# Patient Record
Sex: Female | Born: 1935
Health system: Southern US, Community
[De-identification: ages and names within clinical notes are randomized; demographics above are authoritative.]

## PROBLEM LIST (undated history)

## (undated) DIAGNOSIS — I5030 Unspecified diastolic (congestive) heart failure: Secondary | ICD-10-CM

## (undated) DIAGNOSIS — E785 Hyperlipidemia, unspecified: Secondary | ICD-10-CM

## (undated) DIAGNOSIS — I38 Endocarditis, valve unspecified: Secondary | ICD-10-CM

## (undated) DIAGNOSIS — I4891 Unspecified atrial fibrillation: Secondary | ICD-10-CM

## (undated) DIAGNOSIS — R0602 Shortness of breath: Secondary | ICD-10-CM

## (undated) DIAGNOSIS — F419 Anxiety disorder, unspecified: Secondary | ICD-10-CM

## (undated) DIAGNOSIS — J449 Chronic obstructive pulmonary disease, unspecified: Secondary | ICD-10-CM

## (undated) HISTORY — PX: ROTATOR CUFF REPAIR: SHX139

## (undated) HISTORY — DX: Shortness of breath: R06.02

## (undated) HISTORY — PX: ABDOMINAL HYSTERECTOMY: SHX81

## (undated) HISTORY — PX: BREAST BIOPSY: SHX20

## (undated) HISTORY — PX: CATARACT EXTRACTION: SUR2

## (undated) HISTORY — PX: TONSILLECTOMY: SHX5217

## (undated) HISTORY — DX: Unspecified atrial fibrillation: I48.91

## (undated) HISTORY — DX: Unspecified diastolic (congestive) heart failure: I50.30

## (undated) HISTORY — DX: Anxiety disorder, unspecified: F41.9

## (undated) HISTORY — DX: Chronic obstructive pulmonary disease, unspecified: J44.9

## (undated) HISTORY — DX: Hyperlipidemia, unspecified: E78.5

---

## 2013-02-09 ENCOUNTER — Ambulatory Visit (INDEPENDENT_AMBULATORY_CARE_PROVIDER_SITE_OTHER): Payer: Medicare Other | Admitting: Cardiology

## 2013-02-09 ENCOUNTER — Encounter: Payer: Self-pay | Admitting: Cardiology

## 2013-02-09 ENCOUNTER — Encounter: Payer: Self-pay | Admitting: *Deleted

## 2013-02-09 VITALS — BP 110/82 | HR 130 | Ht 63.0 in | Wt 190.0 lb

## 2013-02-09 DIAGNOSIS — I2510 Atherosclerotic heart disease of native coronary artery without angina pectoris: Secondary | ICD-10-CM

## 2013-02-09 DIAGNOSIS — I5033 Acute on chronic diastolic (congestive) heart failure: Secondary | ICD-10-CM

## 2013-02-09 DIAGNOSIS — R0602 Shortness of breath: Secondary | ICD-10-CM

## 2013-02-09 DIAGNOSIS — I4891 Unspecified atrial fibrillation: Secondary | ICD-10-CM

## 2013-02-09 LAB — HEPATIC FUNCTION PANEL
AST: 26 U/L (ref 0–37)
Alkaline Phosphatase: 58 U/L (ref 39–117)
Indirect Bilirubin: 0.7 mg/dL (ref 0.0–0.9)
Total Protein: 6.1 g/dL (ref 6.0–8.3)

## 2013-02-09 LAB — BASIC METABOLIC PANEL
BUN: 18 mg/dL (ref 6–23)
CO2: 27 mEq/L (ref 19–32)
Chloride: 104 mEq/L (ref 96–112)
Creat: 1.04 mg/dL (ref 0.50–1.10)
Glucose, Bld: 94 mg/dL (ref 70–99)
Potassium: 4.3 mEq/L (ref 3.5–5.3)

## 2013-02-09 LAB — CBC WITH DIFFERENTIAL/PLATELET
Basophils Absolute: 0 10*3/uL (ref 0.0–0.1)
Basophils Relative: 0 % (ref 0–1)
Eosinophils Absolute: 0.2 10*3/uL (ref 0.0–0.7)
Hemoglobin: 12.5 g/dL (ref 12.0–15.0)
MCH: 29.2 pg (ref 26.0–34.0)
MCHC: 34.5 g/dL (ref 30.0–36.0)
Monocytes Absolute: 0.9 10*3/uL (ref 0.1–1.0)
Monocytes Relative: 9 % (ref 3–12)
Neutrophils Relative %: 68 % (ref 43–77)
RDW: 14.6 % (ref 11.5–15.5)

## 2013-02-09 LAB — TSH: TSH: 0.86 u[IU]/mL (ref 0.350–4.500)

## 2013-02-09 MED ORDER — FUROSEMIDE 40 MG PO TABS: 40.0000 mg | ORAL_TABLET | Freq: Every day | ORAL | Status: DC

## 2013-02-09 MED ORDER — POTASSIUM CHLORIDE CRYS ER 20 MEQ PO TBCR: 20.0000 meq | EXTENDED_RELEASE_TABLET | Freq: Every day | ORAL | Status: DC

## 2013-02-09 MED ORDER — AMIODARONE HCL 200 MG PO TABS: ORAL_TABLET | ORAL | Status: DC

## 2013-02-09 NOTE — Patient Instructions (Addendum)
Increase amiodarone to 400mg  two times a day. This will be 2 of your 200mg  tablets two times a day.   Start lasix(furosmide) 40mg  daily.   Start KCL (potassium) 20 mEq daily.  Your physician recommends that you return for lab work today--BMET/BNP/Liver profile/TSH/CBCD  Your physician has recommended that you have a Cardioversion (DCCV). Electrical Cardioversion uses a jolt of electricity to your heart either through paddles or wired patches attached to your chest. This is a controlled, usually prescheduled, procedure. Defibrillation is done under light anesthesia in the hospital, and you usually go home the day of the procedure. This is done to get your heart back into a normal rhythm. You are not awake for the procedure. Please see the instruction sheet given to you today. Tuesday September 16,2014  Your physician has requested that you have an echocardiogram. Echocardiography is a painless test that uses sound waves to create images of your heart. It provides your doctor with information about the size and shape of your heart and how well your heart's chambers and valves are working. This procedure takes approximately one hour. There are no restrictions for this procedure. At [facility] on Tuesday  after the cardioversion.  Your physician recommends that you schedule a follow-up appointment in: 2 weeks with Dr Shirlee Latch.   Your physician recommends that you return for lab work in: 2 weeks when you see Dr McLean--BMET/BNP.

## 2013-02-11 DIAGNOSIS — I5033 Acute on chronic diastolic (congestive) heart failure: Secondary | ICD-10-CM | POA: Insufficient documentation

## 2013-02-11 DIAGNOSIS — I2510 Atherosclerotic heart disease of native coronary artery without angina pectoris: Secondary | ICD-10-CM | POA: Insufficient documentation

## 2013-02-11 DIAGNOSIS — I4891 Unspecified atrial fibrillation: Secondary | ICD-10-CM | POA: Insufficient documentation

## 2013-02-11 NOTE — Progress Notes (Signed)
Patient ID: Nicole [name], female   DOB: July 19, 1935, 77 y.o.   MRN: [id number] PCP: Dr. Ladona Ridgel [facility])  77 yo with history of COPD, atrial fibrillation, and diastolic CHF presents to establish cardiology care.  Atrial fibrillation was first noted in 7/14.  She has been hospitalized twice in [place] with atrial fibrillation, once in 7/14 and again at the end of 8/14.  During the second hospitalization, she was cardioverted to NSR.  She was short of breath at that time with a diastolic CHF exacerbation.  Patient was also on amiodarone.  However, currently the patient is back in atrial fibrillation.  Echo was apparently done at the hospital in [place] and EF was normal.    Patient does not notice that she is in atrial fibrillation.  No orthopnea or PND.  She is short of breath after walking 40-50 feet but no chest pain.    ECG: Atrial fibrillation, HR 109  Labs (9/14): K 4, creatinine 0.9, HCT 33.7, TnI 0.21 (while hospitalized in [place])  PMH: 1. COPD 2. Atrial fibrillation: First noted in 7/14.  DCCV in 8/14 to NSR.  Recurred by 9/14.   3. Hyperlipidemia 4. Diastolic CHF 5. Cardiac cath years ago: No CAD but ?history of coronary anomaly from prior   SH: Lives in [place], retired, nonsmoker (quit 7/14).  Lives alone.  Daughter come to appointments.   FH: No premature CAD.  Breast cancer.   ROS: All systems reviewed and negative except as per HPI.   Current Outpatient Prescriptions  Medication Sig Dispense Refill  . ALPRAZolam (XANAX) 0.25 MG tablet Take 0.25 mg by mouth at bedtime as needed for sleep.      Marland Kitchen atorvastatin (LIPITOR) 40 MG tablet Take 40 mg by mouth daily.      Marland Kitchen diltiazem (CARDIZEM SR) 120 MG 12 hr capsule Take 120 mg by mouth 2 (two) times daily.      Marland Kitchen ipratropium-albuterol (DUONEB) 0.5-2.5 (3) MG/3ML SOLN Take 3 mLs by nebulization.      . Multiple Vitamins-Minerals (MULTIVITAMIN WITH MINERALS) tablet Take 1 tablet by mouth daily.      . Rivaroxaban (XARELTO) 20 MG  TABS tablet Take 20 mg by mouth daily.      Marland Kitchen amiodarone (PACERONE) 200 MG tablet 2 tablets (total 400mg ) two times a day  120 tablet  3  . furosemide (LASIX) 40 MG tablet Take 1 tablet (40 mg total) by mouth daily.  30 tablet  3  . potassium chloride SA (K-DUR,KLOR-CON) 20 MEQ tablet Take 1 tablet (20 mEq total) by mouth daily.  30 tablet  3   No current facility-administered medications for this visit.    BP 110/82  Pulse 130  Ht 5\' 3"  (1.6 m)  Wt 86.183 kg (190 lb)  BMI 33.67 kg/m2 General: NAD Neck: JVP 8-9 cm, no thyromegaly or thyroid nodule.  Lungs: Clear to auscultation bilaterally with normal respiratory effort. CV: Nondisplaced PMI.  Heart irregular S1/S2, no S3/S4, 3/6 systolic murmur RUSB, S2 somewhat obscured.  1+ ankle edema.  No carotid bruit.  Normal pedal pulses.  Abdomen: Soft, nontender, no hepatosplenomegaly, no distention.  Skin: Intact without lesions or rashes.  Neurologic: Alert and oriented x 3.  Psych: Normal affect. Extremities: No clubbing or cyanosis.  HEENT: Normal.   Assessment/Plan: 1. Atrial fibrillation: Patient is back in atrial fibrillation after initial cardioversion in [place].  The atrial fibrillation is likely contributing to the patient's CHF symptoms.   - Continue to load amiodarone => will increase  it to 400 mg bid for now.   - Continue diltiazem CD and Xarelto 20.   - Check LFTs/TSH on amiodarone.  Patient will need yearly eye exam.  - Patient will eventually need a sleep study.  2. Elevated troponin: Troponin was mildly elevated at hospital in [place] in the setting of atrial fibrillation with RVR.  Most likely demand ischemia, no chest pain.  Will get echo to look for any wall motion abnormalities.  3. CHF: Patient is volume overloaded on exam.  EF was normal on echo in [place].  I suspect that atrial fibrillation has contributed to CHF.   - As above, will try to get her out of atrial fibrillation.  - Start Lasix 40 mg daily and KCl 20  mEq daily.  - Check BMET/BNP in 10 days.  4. Aortic stenosis: Patient has murmur of aortic stenosis.  I will assess how severe it is by echo (will do echo after cardioversion).      [name] 02/11/2013

## 2013-02-13 ENCOUNTER — Ambulatory Visit (HOSPITAL_COMMUNITY): Payer: Medicare Other | Admitting: Anesthesiology

## 2013-02-13 ENCOUNTER — Encounter (HOSPITAL_COMMUNITY): Payer: Self-pay | Admitting: *Deleted

## 2013-02-13 ENCOUNTER — Encounter (HOSPITAL_COMMUNITY)
Admission: RE | Disposition: A | Discharge status: Home / Self Care | Payer: Self-pay | Source: Ambulatory Visit | Attending: Cardiology

## 2013-02-13 ENCOUNTER — Ambulatory Visit (HOSPITAL_COMMUNITY)
Admission: RE | Admit: 2013-02-13 | Discharge: 2013-02-13 | Disposition: A | Discharge status: Home / Self Care | Payer: Medicare Other | Source: Ambulatory Visit | Attending: Cardiology | Admitting: Cardiology

## 2013-02-13 ENCOUNTER — Encounter (HOSPITAL_COMMUNITY): Payer: Self-pay | Admitting: Anesthesiology

## 2013-02-13 DIAGNOSIS — E785 Hyperlipidemia, unspecified: Secondary | ICD-10-CM | POA: Insufficient documentation

## 2013-02-13 DIAGNOSIS — I359 Nonrheumatic aortic valve disorder, unspecified: Secondary | ICD-10-CM | POA: Insufficient documentation

## 2013-02-13 DIAGNOSIS — I4891 Unspecified atrial fibrillation: Secondary | ICD-10-CM | POA: Insufficient documentation

## 2013-02-13 DIAGNOSIS — Z7901 Long term (current) use of anticoagulants: Secondary | ICD-10-CM | POA: Insufficient documentation

## 2013-02-13 DIAGNOSIS — I509 Heart failure, unspecified: Secondary | ICD-10-CM | POA: Insufficient documentation

## 2013-02-13 DIAGNOSIS — R0602 Shortness of breath: Secondary | ICD-10-CM

## 2013-02-13 DIAGNOSIS — Z79899 Other long term (current) drug therapy: Secondary | ICD-10-CM | POA: Insufficient documentation

## 2013-02-13 DIAGNOSIS — J4489 Other specified chronic obstructive pulmonary disease: Secondary | ICD-10-CM | POA: Insufficient documentation

## 2013-02-13 DIAGNOSIS — J449 Chronic obstructive pulmonary disease, unspecified: Secondary | ICD-10-CM | POA: Insufficient documentation

## 2013-02-13 DIAGNOSIS — I5022 Chronic systolic (congestive) heart failure: Secondary | ICD-10-CM | POA: Insufficient documentation

## 2013-02-13 HISTORY — PX: CARDIOVERSION: SHX1299

## 2013-02-13 SURGERY — CARDIOVERSION
Anesthesia: Monitor Anesthesia Care

## 2013-02-13 MED ORDER — PERFLUTREN LIPID MICROSPHERE
1.0000 mL | INTRAVENOUS | Status: AC | PRN
  Administered 2013-02-13: 2 mL via INTRAVENOUS
  Filled 2013-02-13: qty 10

## 2013-02-13 MED ORDER — SODIUM CHLORIDE 0.9 % IV SOLN
INTRAVENOUS | Status: DC | PRN
  Administered 2013-02-13: 10:00:00 via INTRAVENOUS

## 2013-02-13 MED ORDER — SODIUM CHLORIDE 0.9 % IV SOLN
INTRAVENOUS | Status: DC
  Administered 2013-02-13: 10:00:00 via INTRAVENOUS

## 2013-02-13 MED ORDER — PROPOFOL 10 MG/ML IV BOLUS
INTRAVENOUS | Status: DC | PRN
  Administered 2013-02-13: 80 mg via INTRAVENOUS

## 2013-02-13 NOTE — H&P (View-Only) (Signed)
Patient ID: Nicole [name], female   DOB: 07/04/1935, 77 y.o.   MRN: [id number] PCP: Dr. Taylor ([place])  77 yo with history of COPD, atrial fibrillation, and diastolic CHF presents to establish cardiology care.  Atrial fibrillation was first noted in 7/14.  She has been hospitalized twice in [place] with atrial fibrillation, once in 7/14 and again at the end of 8/14.  During the second hospitalization, she was cardioverted to NSR.  She was short of breath at that time with a diastolic CHF exacerbation.  Patient was also on amiodarone.  However, currently the patient is back in atrial fibrillation.  Echo was apparently done at the hospital in [place] and EF was normal.    Patient does not notice that she is in atrial fibrillation.  No orthopnea or PND.  She is short of breath after walking 40-50 feet but no chest pain.    ECG: Atrial fibrillation, HR 109  Labs (9/14): K 4, creatinine 0.9, HCT 33.7, TnI 0.21 (while hospitalized in [place])  PMH: 1. COPD 2. Atrial fibrillation: First noted in 7/14.  DCCV in 8/14 to NSR.  Recurred by 9/14.   3. Hyperlipidemia 4. Diastolic CHF 5. Cardiac cath years ago: No CAD but ?history of coronary anomaly from prior   SH: Lives in [place], retired, nonsmoker (quit 7/14).  Lives alone.  Daughter come to appointments.   FH: No premature CAD.  Breast cancer.   ROS: All systems reviewed and negative except as per HPI.   Current Outpatient Prescriptions  Medication Sig Dispense Refill  . ALPRAZolam (XANAX) 0.25 MG tablet Take 0.25 mg by mouth at bedtime as needed for sleep.      . atorvastatin (LIPITOR) 40 MG tablet Take 40 mg by mouth daily.      . diltiazem (CARDIZEM SR) 120 MG 12 hr capsule Take 120 mg by mouth 2 (two) times daily.      . ipratropium-albuterol (DUONEB) 0.5-2.5 (3) MG/3ML SOLN Take 3 mLs by nebulization.      . Multiple Vitamins-Minerals (MULTIVITAMIN WITH MINERALS) tablet Take 1 tablet by mouth daily.      . Rivaroxaban (XARELTO) 20 MG  TABS tablet Take 20 mg by mouth daily.      . amiodarone (PACERONE) 200 MG tablet 2 tablets (total 400mg) two times a day  120 tablet  3  . furosemide (LASIX) 40 MG tablet Take 1 tablet (40 mg total) by mouth daily.  30 tablet  3  . potassium chloride SA (K-DUR,KLOR-CON) 20 MEQ tablet Take 1 tablet (20 mEq total) by mouth daily.  30 tablet  3   No current facility-administered medications for this visit.    BP 110/82  Pulse 130  Ht 5' 3" (1.6 m)  Wt 86.183 kg (190 lb)  BMI 33.67 kg/m2 General: NAD Neck: JVP 8-9 cm, no thyromegaly or thyroid nodule.  Lungs: Clear to auscultation bilaterally with normal respiratory effort. CV: Nondisplaced PMI.  Heart irregular S1/S2, no S3/S4, 3/6 systolic murmur RUSB, S2 somewhat obscured.  1+ ankle edema.  No carotid bruit.  Normal pedal pulses.  Abdomen: Soft, nontender, no hepatosplenomegaly, no distention.  Skin: Intact without lesions or rashes.  Neurologic: Alert and oriented x 3.  Psych: Normal affect. Extremities: No clubbing or cyanosis.  HEENT: Normal.   Assessment/Plan: 1. Atrial fibrillation: Patient is back in atrial fibrillation after initial cardioversion in [place].  The atrial fibrillation is likely contributing to the patient's CHF symptoms.   - Continue to load amiodarone => will increase   it to 400 mg bid for now.   - Continue diltiazem CD and Xarelto 20.   - Check LFTs/TSH on amiodarone.  Patient will need yearly eye exam.  - Patient will eventually need a sleep study.  2. Elevated troponin: Troponin was mildly elevated at hospital in [place] in the setting of atrial fibrillation with RVR.  Most likely demand ischemia, no chest pain.  Will get echo to look for any wall motion abnormalities.  3. CHF: Patient is volume overloaded on exam.  EF was normal on echo in [place].  I suspect that atrial fibrillation has contributed to CHF.   - As above, will try to get her out of atrial fibrillation.  - Start Lasix 40 mg daily and KCl 20  mEq daily.  - Check BMET/BNP in 10 days.  4. Aortic stenosis: Patient has murmur of aortic stenosis.  I will assess how severe it is by echo (will do echo after cardioversion).      [name] 02/11/2013  

## 2013-02-13 NOTE — Interval H&P Note (Signed)
History and Physical Interval Note:  02/13/2013 10:00 AM  Nicole [name]  has presented today for surgery, with the diagnosis of A FIB  The various methods of treatment have been discussed with the patient and family. After consideration of risks, benefits and other options for treatment, the patient has consented to  Procedure(s): CARDIOVERSION (N/A) as a surgical intervention .  The patient's history has been reviewed, patient examined, no change in status, stable for surgery.  I have reviewed the patient's chart and labs.  Questions were answered to the patient's satisfaction.     Drevion Offord [employer]

## 2013-02-13 NOTE — Anesthesia Preprocedure Evaluation (Signed)
Anesthesia Evaluation  Patient identified by MRN, date of birth, ID band Patient awake    Reviewed: Allergy & Precautions, H&P , NPO status , Patient's Chart, lab work & pertinent test results, reviewed documented beta blocker date and time   Airway       Dental   Pulmonary          Cardiovascular     Neuro/Psych    GI/Hepatic   Endo/Other    Renal/GU      Musculoskeletal   Abdominal   Peds  Hematology   Anesthesia Other Findings   Reproductive/Obstetrics                           Anesthesia Physical Anesthesia Plan  ASA: III  Anesthesia Plan:    Post-op Pain Management:    Induction:   Airway Management Planned:   Additional Equipment:   Intra-op Plan:   Post-operative Plan:   Informed Consent:   Plan Discussed with:   Anesthesia Plan Comments:         Anesthesia Quick Evaluation

## 2013-02-13 NOTE — Procedures (Signed)
Electrical Cardioversion Procedure Note Nicole [name] [id number] [birth year]  Procedure: Electrical Cardioversion Indications:  Atrial Fibrillation  Procedure Details Consent: Risks of procedure as well as the alternatives and risks of each were explained to the (patient/caregiver).  Consent for procedure obtained. Time Out: Verified patient identification, verified procedure, site/side was marked, verified correct patient position, special equipment/implants available, medications/allergies/relevent history reviewed, required imaging and test results available.  Performed  Patient placed on cardiac monitor, pulse oximetry, supplemental oxygen as necessary.  Sedation given: Propofol per anesthesiology Pacer pads placed anterior and posterior chest.  Cardioverted 1 time(s).  Cardioverted at 200J.  Evaluation Findings: Post procedure EKG shows: NSR Complications: None Patient did tolerate procedure well.   Nicole [name] 02/13/2013, 10:06 AM

## 2013-02-13 NOTE — Preoperative (Signed)
Beta Blockers   Reason not to administer Beta Blockers:Not Applicable 

## 2013-02-13 NOTE — Interval H&P Note (Signed)
History and Physical Interval Note:  02/13/2013 9:55 AM  Nicole [name]  has presented today for surgery, with the diagnosis of A FIB  The various methods of treatment have been discussed with the patient and family. After consideration of risks, benefits and other options for treatment, the patient has consented to  Procedure(s): CARDIOVERSION (N/A) as a surgical intervention .  The patient's history has been reviewed, patient examined, no change in status, stable for surgery.  I have reviewed the patient's chart and labs.  Questions were answered to the patient's satisfaction.     Jasmina Gendron [employer]

## 2013-02-13 NOTE — Transfer of Care (Signed)
Immediate Anesthesia Transfer of Care Note  Patient: Nicole [name]  Procedure(s) Performed: Procedure(s): CARDIOVERSION (N/A)  Patient Location: PACU  Anesthesia Type:General  Level of Consciousness: awake, alert  and oriented  Airway & Oxygen Therapy: Patient Spontanous Breathing and Patient connected to nasal cannula oxygen  Post-op Assessment: Report given to PACU RN and Post -op Vital signs reviewed and stable  Post vital signs: Reviewed and stable  Complications: No apparent anesthesia complications

## 2013-02-13 NOTE — Progress Notes (Signed)
Echocardiogram 2D Echocardiogram with Definity has been performed.  Atina Feeley 02/13/2013, 12:53 PM

## 2013-02-13 NOTE — Anesthesia Postprocedure Evaluation (Signed)
  Anesthesia Post-op Note  Patient: Nicole [name]  Procedure(s) Performed: Procedure(s): CARDIOVERSION (N/A)  Patient Location: Endo Anesthesia Type:MAC  Level of Consciousness: awake  Airway and Oxygen Therapy: Patient Spontanous Breathing  Post-op Pain: mild  Post-op Assessment: Post-op Vital signs reviewed  Post-op Vital Signs: Reviewed  Complications: No apparent anesthesia complications

## 2013-02-14 ENCOUNTER — Encounter (HOSPITAL_COMMUNITY): Payer: Self-pay | Admitting: Cardiology

## 2013-02-26 ENCOUNTER — Encounter: Payer: Self-pay | Admitting: *Deleted

## 2013-02-28 ENCOUNTER — Encounter (HOSPITAL_COMMUNITY): Payer: Self-pay | Admitting: Anesthesiology

## 2013-02-28 ENCOUNTER — Inpatient Hospital Stay (HOSPITAL_COMMUNITY): Payer: Medicare Other

## 2013-02-28 ENCOUNTER — Encounter: Payer: Self-pay | Admitting: Cardiology

## 2013-02-28 ENCOUNTER — Ambulatory Visit (INDEPENDENT_AMBULATORY_CARE_PROVIDER_SITE_OTHER): Payer: Medicare Other | Admitting: Cardiology

## 2013-02-28 ENCOUNTER — Inpatient Hospital Stay (HOSPITAL_COMMUNITY): Payer: Medicare Other | Admitting: Anesthesiology

## 2013-02-28 ENCOUNTER — Other Ambulatory Visit: Payer: Medicare Other

## 2013-02-28 ENCOUNTER — Inpatient Hospital Stay (HOSPITAL_COMMUNITY)
Admission: AD | Admit: 2013-02-28 | Discharge: 2013-03-16 | DRG: 216 | Disposition: A | Discharge status: Skilled Nursing Facility | Payer: Medicare Other | Source: Ambulatory Visit | Attending: Surgery | Admitting: Surgery

## 2013-02-28 VITALS — BP 100/64 | HR 142 | Wt 187.0 lb

## 2013-02-28 DIAGNOSIS — I359 Nonrheumatic aortic valve disorder, unspecified: Secondary | ICD-10-CM

## 2013-02-28 DIAGNOSIS — Z8679 Personal history of other diseases of the circulatory system: Secondary | ICD-10-CM

## 2013-02-28 DIAGNOSIS — I4891 Unspecified atrial fibrillation: Secondary | ICD-10-CM

## 2013-02-28 DIAGNOSIS — I350 Nonrheumatic aortic (valve) stenosis: Secondary | ICD-10-CM | POA: Insufficient documentation

## 2013-02-28 DIAGNOSIS — I509 Heart failure, unspecified: Secondary | ICD-10-CM

## 2013-02-28 DIAGNOSIS — I2510 Atherosclerotic heart disease of native coronary artery without angina pectoris: Secondary | ICD-10-CM

## 2013-02-28 DIAGNOSIS — I5032 Chronic diastolic (congestive) heart failure: Secondary | ICD-10-CM

## 2013-02-28 DIAGNOSIS — Z952 Presence of prosthetic heart valve: Secondary | ICD-10-CM

## 2013-02-28 DIAGNOSIS — R0602 Shortness of breath: Secondary | ICD-10-CM

## 2013-02-28 DIAGNOSIS — I5033 Acute on chronic diastolic (congestive) heart failure: Secondary | ICD-10-CM

## 2013-02-28 LAB — HEPATIC FUNCTION PANEL
ALT: 32 U/L (ref 0–35)
Alkaline Phosphatase: 69 U/L (ref 39–117)
Bilirubin, Direct: 0.2 mg/dL (ref 0.0–0.3)
Indirect Bilirubin: 0.6 mg/dL (ref 0.3–0.9)
Total Bilirubin: 0.8 mg/dL (ref 0.3–1.2)

## 2013-02-28 LAB — CBC WITH DIFFERENTIAL/PLATELET
Basophils Absolute: 0 10*3/uL (ref 0.0–0.1)
Basophils Relative: 0 % (ref 0–1)
Eosinophils Absolute: 0.2 10*3/uL (ref 0.0–0.7)
Eosinophils Relative: 2 % (ref 0–5)
HCT: 33.5 % — ABNORMAL LOW (ref 36.0–46.0)
MCHC: 32.8 g/dL (ref 30.0–36.0)
MCV: 88.9 fL (ref 78.0–100.0)
Monocytes Absolute: 0.9 10*3/uL (ref 0.1–1.0)
Monocytes Relative: 8 % (ref 3–12)
Neutro Abs: 8.4 10*3/uL — ABNORMAL HIGH (ref 1.7–7.7)
RDW: 15.3 % (ref 11.5–15.5)
WBC: 10.9 10*3/uL — ABNORMAL HIGH (ref 4.0–10.5)

## 2013-02-28 LAB — BASIC METABOLIC PANEL
BUN: 18 mg/dL (ref 6–23)
Calcium: 9.5 mg/dL (ref 8.4–10.5)
Chloride: 98 mEq/L (ref 96–112)
Creatinine, Ser: 1.19 mg/dL — ABNORMAL HIGH (ref 0.50–1.10)
GFR calc Af Amer: 50 mL/min — ABNORMAL LOW (ref >90)
Sodium: 136 mEq/L (ref 135–145)

## 2013-02-28 LAB — GLUCOSE, CAPILLARY: Glucose-Capillary: 101 mg/dL — ABNORMAL HIGH (ref 70–99)

## 2013-02-28 LAB — TROPONIN I: Troponin I: <0.3 ng/mL (ref <0.30)

## 2013-02-28 MED ORDER — ONDANSETRON HCL 4 MG/2ML IJ SOLN: 4.0000 mg | Freq: Four times a day (QID) | INTRAMUSCULAR | Status: DC | PRN

## 2013-02-28 MED ORDER — ALBUTEROL SULFATE (5 MG/ML) 0.5% IN NEBU
2.5000 mg | INHALATION_SOLUTION | Freq: Three times a day (TID) | RESPIRATORY_TRACT | Status: DC
  Administered 2013-02-28 – 2013-03-16 (×44): 2.5 mg via RESPIRATORY_TRACT
  Filled 2013-02-28 (×46): qty 0.5

## 2013-02-28 MED ORDER — SODIUM CHLORIDE 0.9 % IJ SOLN: 3.0000 mL | INTRAMUSCULAR | Status: DC | PRN

## 2013-02-28 MED ORDER — SODIUM CHLORIDE 0.9 % IV SOLN
250.0000 mL | INTRAVENOUS | Status: DC
  Administered 2013-02-28: 1000 mL via INTRAVENOUS

## 2013-02-28 MED ORDER — SODIUM CHLORIDE 0.9 % IJ SOLN
3.0000 mL | Freq: Two times a day (BID) | INTRAMUSCULAR | Status: DC
  Administered 2013-02-28 – 2013-03-04 (×7): 3 mL via INTRAVENOUS

## 2013-02-28 MED ORDER — ADULT MULTIVITAMIN W/MINERALS CH
1.0000 | ORAL_TABLET | Freq: Every day | ORAL | Status: DC
  Administered 2013-03-01 – 2013-03-16 (×14): 1 via ORAL
  Filled 2013-02-28 (×17): qty 1

## 2013-02-28 MED ORDER — SODIUM CHLORIDE 0.9 % IV SOLN: 250.0000 mL | INTRAVENOUS | Status: DC | PRN

## 2013-02-28 MED ORDER — ATORVASTATIN CALCIUM 40 MG PO TABS
40.0000 mg | ORAL_TABLET | Freq: Every evening | ORAL | Status: DC
  Administered 2013-02-28 – 2013-03-15 (×14): 40 mg via ORAL
  Filled 2013-02-28 (×17): qty 1

## 2013-02-28 MED ORDER — IPRATROPIUM BROMIDE 0.02 % IN SOLN
0.5000 mg | Freq: Three times a day (TID) | RESPIRATORY_TRACT | Status: DC
  Administered 2013-02-28 – 2013-03-16 (×44): 0.5 mg via RESPIRATORY_TRACT
  Filled 2013-02-28 (×46): qty 2.5

## 2013-02-28 MED ORDER — AMIODARONE HCL IN DEXTROSE 360-4.14 MG/200ML-% IV SOLN
60.0000 mg/h | INTRAVENOUS | Status: AC
  Administered 2013-02-28 (×2): 60 mg/h via INTRAVENOUS
  Filled 2013-02-28 (×2): qty 200

## 2013-02-28 MED ORDER — ACETAMINOPHEN 325 MG PO TABS: 650.0000 mg | ORAL_TABLET | ORAL | Status: DC | PRN

## 2013-02-28 MED ORDER — ALBUTEROL SULFATE (5 MG/ML) 0.5% IN NEBU: 2.5000 mg | INHALATION_SOLUTION | Freq: Three times a day (TID) | RESPIRATORY_TRACT | Status: DC

## 2013-02-28 MED ORDER — DILTIAZEM HCL 30 MG PO TABS: 30.0000 mg | ORAL_TABLET | Freq: Three times a day (TID) | ORAL | Status: DC

## 2013-02-28 MED ORDER — AMIODARONE HCL IN DEXTROSE 360-4.14 MG/200ML-% IV SOLN
30.0000 mg/h | INTRAVENOUS | Status: DC
  Administered 2013-02-28: 30 mg/h via INTRAVENOUS
  Filled 2013-02-28 (×3): qty 200

## 2013-02-28 MED ORDER — POTASSIUM CHLORIDE CRYS ER 20 MEQ PO TBCR
20.0000 meq | EXTENDED_RELEASE_TABLET | Freq: Every evening | ORAL | Status: DC
  Administered 2013-02-28 – 2013-03-04 (×4): 20 meq via ORAL
  Filled 2013-02-28 (×7): qty 1

## 2013-02-28 MED ORDER — DILTIAZEM HCL 30 MG PO TABS
30.0000 mg | ORAL_TABLET | Freq: Three times a day (TID) | ORAL | Status: DC
  Filled 2013-02-28 (×5): qty 1

## 2013-02-28 MED ORDER — RIVAROXABAN 20 MG PO TABS
20.0000 mg | ORAL_TABLET | Freq: Every day | ORAL | Status: DC
  Administered 2013-02-28: 20 mg via ORAL
  Filled 2013-02-28 (×2): qty 1

## 2013-02-28 MED ORDER — IPRATROPIUM BROMIDE 0.02 % IN SOLN: 0.5000 mg | Freq: Three times a day (TID) | RESPIRATORY_TRACT | Status: DC

## 2013-02-28 MED ORDER — DILTIAZEM HCL ER 60 MG PO CP12
120.0000 mg | ORAL_CAPSULE | Freq: Two times a day (BID) | ORAL | Status: DC
  Filled 2013-02-28: qty 2

## 2013-02-28 MED ORDER — AMIODARONE LOAD VIA INFUSION
150.0000 mg | Freq: Once | INTRAVENOUS | Status: AC
  Administered 2013-02-28: 150 mg via INTRAVENOUS
  Filled 2013-02-28: qty 83.34

## 2013-02-28 MED ORDER — IPRATROPIUM-ALBUTEROL 0.5-2.5 (3) MG/3ML IN SOLN: 3.0000 mL | Freq: Three times a day (TID) | RESPIRATORY_TRACT | Status: DC

## 2013-02-28 MED ORDER — FENTANYL CITRATE 0.05 MG/ML IJ SOLN
INTRAMUSCULAR | Status: AC
  Filled 2013-02-28: qty 2

## 2013-02-28 MED ORDER — SODIUM CHLORIDE 0.9 % IJ SOLN
3.0000 mL | Freq: Two times a day (BID) | INTRAMUSCULAR | Status: DC
  Administered 2013-03-01 – 2013-03-02 (×2): 3 mL via INTRAVENOUS

## 2013-02-28 MED ORDER — SODIUM CHLORIDE 0.9 % IV SOLN: INTRAVENOUS | Status: DC

## 2013-02-28 MED ORDER — MIDAZOLAM HCL 2 MG/2ML IJ SOLN
INTRAMUSCULAR | Status: AC
  Filled 2013-02-28: qty 2

## 2013-02-28 MED ORDER — LACTATED RINGERS IV SOLN
INTRAVENOUS | Status: DC | PRN
  Administered 2013-02-28: 14:00:00 via INTRAVENOUS

## 2013-02-28 MED ORDER — AMIODARONE HCL 200 MG PO TABS
400.0000 mg | ORAL_TABLET | Freq: Two times a day (BID) | ORAL | Status: DC
  Filled 2013-02-28: qty 2

## 2013-02-28 MED ORDER — ALPRAZOLAM 0.25 MG PO TABS
0.2500 mg | ORAL_TABLET | Freq: Every evening | ORAL | Status: DC | PRN
  Administered 2013-03-04: 0.25 mg via ORAL
  Filled 2013-02-28: qty 1

## 2013-02-28 MED ORDER — FUROSEMIDE 40 MG PO TABS
40.0000 mg | ORAL_TABLET | Freq: Every day | ORAL | Status: DC
  Filled 2013-02-28: qty 1

## 2013-02-28 NOTE — Progress Notes (Addendum)
Pt received to 2900 without acute complaints. Denies acute CP or SOB. HR still 120s-140s afib but BP running soft. Pt took AM meds today including Lasix/Amiodarone/Diltiazem. Compliant with Xarelto (takes qpm). D/w Dr. Shirlee Latch - will plan IV amio in place of oral and try to arrange DCCV today. Will still need TEE tomorrow per Dr. Shirlee Latch to eval AS. Dayna Dunn PA-C  DCCV planned for 2pm. Dayna Dunn PA-C

## 2013-02-28 NOTE — Transfer of Care (Signed)
Immediate Anesthesia Transfer of Care Note  Patient: Nicole [name]  Procedure(s) Performed: * No procedures listed *  Patient Location: PACU  Anesthesia Type:MAC  Level of Consciousness: awake, alert  and oriented  Airway & Oxygen Therapy: Patient Spontanous Breathing and Patient connected to nasal cannula oxygen  Post-op Assessment: Report given to PACU RN, Post -op Vital signs reviewed and stable and Patient moving all extremities X 4  Post vital signs: Reviewed and stable  Complications: No apparent anesthesia complications

## 2013-02-28 NOTE — Progress Notes (Signed)
Patient ID: Nicole [name], female   DOB: [birth year], 77 y.o.   MRN: [id number] PCP: Dr. Ladona Ridgel [facility])  77 y.o. with history of COPD, atrial fibrillation, and diastolic CHF presents for cardiology followup.  Atrial fibrillation was first noted in 7/14.  She has been hospitalized twice in [place] with atrial fibrillation, once in 7/14 and again at the end of 8/14.  During the second hospitalization, she was cardioverted to NSR.  She was short of breath at that time with a diastolic CHF exacerbation.  Patient was also on amiodarone.  However, at first appointment with me earlier this month she was back in atrial fibrillation. I cardioverted her on 02/13/13. Initially, she felt great with minimal dyspnea.  However, this Monday she once again became short of breath with minimal exertion and felt tachypalpitations.  She gets short of breath walking from her bedroom to the kitchen.  No lightheadedness though blood pressure is soft today.  No chest pain.   On ECG this morning, she is back in NSR with RVR.   Of note, echo was done in 9/14 showing normal EF, mild LVH, and at least moderate to possibly severe aortic stenosis.     ECG: Atrial fibrillation, HR 142  Labs (9/14): K 4, creatinine 0.9 => 1.04, HCT 33.7, TnI 0.21 (while hospitalized in [place]), BNP 158, TSH normal, AST normal, ALT 39  PMH: 1. COPD 2. Atrial fibrillation: First noted in 7/14.  DCCV in 8/14 to NSR.  Recurred by 9/14.  DCCV to NSR 9/14 but recurred.  3. Hyperlipidemia 4. Diastolic CHF: Echo (9/14) with EF 55-60%, grade II diastolic dysfunction, mild LVH, moderate to severe AS with mean gradient 33 mmHg/peak 49 mmHg and AVA 0.74 cm^2.  5. Cardiac cath years ago: No CAD but ?history of coronary anomaly from prior   SH: Lives in [place], retired, nonsmoker (quit 7/14).  Lives alone.  Daughter come to appointments.   FH: No premature CAD.  Breast cancer.   ROS: All systems reviewed and negative except as per HPI.   Current  Outpatient Prescriptions  Medication Sig Dispense Refill  . ALPRAZolam (XANAX) 0.25 MG tablet Take 0.25 mg by mouth at bedtime as needed for sleep.      Marland Kitchen amiodarone (PACERONE) 200 MG tablet 2 tablets (total 400mg ) two times a day  120 tablet  3  . atorvastatin (LIPITOR) 40 MG tablet Take 40 mg by mouth daily.      Marland Kitchen diltiazem (CARDIZEM SR) 120 MG 12 hr capsule Take 120 mg by mouth 2 (two) times daily.      . furosemide (LASIX) 40 MG tablet Take 1 tablet (40 mg total) by mouth daily.  30 tablet  3  . ipratropium-albuterol (DUONEB) 0.5-2.5 (3) MG/3ML SOLN Take 3 mLs by nebulization.      . Multiple Vitamins-Minerals (MULTIVITAMIN WITH MINERALS) tablet Take 1 tablet by mouth daily.      . potassium chloride SA (K-DUR,KLOR-CON) 20 MEQ tablet Take 1 tablet (20 mEq total) by mouth daily.  30 tablet  3  . Rivaroxaban (XARELTO) 20 MG TABS tablet Take 20 mg by mouth daily.       No current facility-administered medications for this visit.    BP 100/64  Pulse 142  Wt 84.823 kg (187 lb)  BMI 33.13 kg/m2 General: NAD Neck: JVP 7 cm, no thyromegaly or thyroid nodule.  Lungs: Clear to auscultation bilaterally with normal respiratory effort. CV: Nondisplaced PMI.  Heart tachy, irregular S1/S2, no S3/S4, 3/6 systolic murmur  RUSB, S2 somewhat obscured.  No edema.  No carotid bruit.  Normal pedal pulses.  Abdomen: Soft, nontender, no hepatosplenomegaly, no distention.  Skin: Intact without lesions or rashes.  Neurologic: Alert and oriented x 3.  Psych: Normal affect. Extremities: No clubbing or cyanosis.  HEENT: Normal.   Assessment/Plan: 1. Atrial fibrillation: Patient is back in atrial fibrillation after we cardioverted her on 9/16. She felt great when back in NSR but now with significant (NYHA class IV dyspnea).  She tolerates atrial fibrillation poorly and is in RVR today.  - I am going to admit her today for rate management.  Will likely need diltiazem gtt but will continue to monitor BP closely.   Also continue amiodarone for now.  - Will plan to cardiovert again tomorrow to get temporary control.  However, I think she is going to need more definitive treatment => MAZE procedure if aortic valve needs replacement versus ablation.   - Continue Xarelto.  Has not missed any doses.    - Patient will eventually need a sleep study.  2. Elevated troponin: Troponin was mildly elevated at hospital in [place] in the setting of atrial fibrillation with RVR.  Most likely demand ischemia, no chest pain.  No wall motion abnormalities on echo.  Will get troponin x 1 today.  If she needs AVR, will need pre-op cardiac cath.  3. Chronic diastolic CHF: NYHA class IV symptoms but she actually does not look particularly volume overloaded.  I started her on Lasix at last visit.  I think main cause of current symptoms is atrial fibrillation with RVR.   - As above, will try to get her out of atrial fibrillation.  - Continue po Lasix.   - Check BMET/BNP today.  4. Aortic stenosis: At least moderate and possibly severe AS by echo.  Mean gradient in moderate range but severe by calculated valve area.  I am going to do a TEE tomorrow to get a better look at her aortic valve.  AVR with MAZE may be needed.   [name] 02/28/2013

## 2013-02-28 NOTE — Anesthesia Preprocedure Evaluation (Signed)
Anesthesia Evaluation Anesthesia Physical Anesthesia Plan Anesthesia Quick Evaluation  

## 2013-02-28 NOTE — Anesthesia Preprocedure Evaluation (Deleted)
Anesthesia Evaluation  Patient identified by MRN, date of birth, ID band Patient awake    Airway Mallampati: II TM Distance: >3 FB     Dental  (+) Teeth Intact and Dental Advidsory Given   Pulmonary shortness of breath, COPD         Cardiovascular + CAD and +CHF + dysrhythmias Atrial Fibrillation + Valvular Problems/Murmurs AS Rhythm:irregular     Neuro/Psych    GI/Hepatic   Endo/Other    Renal/GU      Musculoskeletal   Abdominal   Peds  Hematology   Anesthesia Other Findings   Reproductive/Obstetrics                           Anesthesia Physical Anesthesia Plan  ASA: III  Anesthesia Plan:    Post-op Pain Management:    Induction:   Airway Management Planned:   Additional Equipment:   Intra-op Plan:   Post-operative Plan:   Informed Consent:   Dental Advisory Given  Plan Discussed with: Anesthesiologist, CRNA and Surgeon  Anesthesia Plan Comments:         Anesthesia Quick Evaluation

## 2013-02-28 NOTE — Care Management Note (Signed)
    Page 1 of 1   02/28/2013     12:07:17 PM   CARE MANAGEMENT NOTE 02/28/2013  Patient:  Nicole [name],Nicole [name]   Account Number:  [id number]  Date Initiated:  02/28/2013  Documentation initiated by:  Junius Creamer  Subjective/Objective Assessment:   adm w at fib     Action/Plan:   lives alone per chart   Anticipated DC Date:     Anticipated DC Plan:  HOME/SELF CARE      DC Planning Services  CM consult      Choice offered to / List presented to:             Status of service:   Medicare Important Message given?   (If response is "NO", the following Medicare IM given date fields will be blank) Date Medicare IM given:   Date Additional Medicare IM given:    Discharge Disposition:    Per UR Regulation:  Reviewed for med. necessity/level of care/duration of stay  If discussed at Long Length of Stay Meetings, dates discussed:    Comments:

## 2013-02-28 NOTE — Patient Instructions (Addendum)
Go to Main Entrance A/ [facility], report to admitting.

## 2013-02-28 NOTE — Progress Notes (Signed)
Patient ID: Nicole [name], female   DOB: 1936/01/31, 77 y.o.   MRN: [id number] [facility]:  Asked to semi emergently cardiovert patient this afternoon.  On xarelto.  Moderate to severe AS  Tolerates rapid rates poorly with dyspnea and hypotension.  BP 90/palp  Rate 145 on amiodarone.  Dr Boneta Lucks administered etomidate.  Single 120 J biphasic shock delivered with conversion to NSR  No immediate neurologic sequelae.  Continue amiodarone Iv  Agree with Dr Shirlee Latch that AVR with MAZE will likely be needed.  TEE in am.  Discussed care with daughter [name] who is one of our CCU nurses  [name]

## 2013-02-28 NOTE — H&P (Signed)
See below for Dr. Alford Highland Progress Note from the office today which serves as the patient's H&P this admission: ----------------- PCP: Dr. Ladona Ridgel [facility])  77 yo with history of COPD, atrial fibrillation, and diastolic CHF presents for cardiology followup. Atrial fibrillation was first noted in 7/14. She has been hospitalized twice in [place] with atrial fibrillation, once in 7/14 and again at the end of 8/14. During the second hospitalization, she was cardioverted to NSR. She was short of breath at that time with a diastolic CHF exacerbation. Patient was also on amiodarone. However, at first appointment with me earlier this month she was back in atrial fibrillation. I cardioverted her on 02/13/13. Initially, she felt great with minimal dyspnea. However, this Monday she once again became short of breath with minimal exertion and felt tachypalpitations. She gets short of breath walking from her bedroom to the kitchen. No lightheadedness though blood pressure is soft today. No chest pain. On ECG this morning, she is back in NSR with RVR.   Of note, echo was done in 9/14 showing normal EF, mild LVH, and at least moderate to possibly severe aortic stenosis.   ECG: Atrial fibrillation, HR 142  Labs (9/14): K 4, creatinine 0.9 => 1.04, HCT 33.7, TnI 0.21 (while hospitalized in [place]), BNP 158, TSH normal, AST normal, ALT 39   PMH:  1. COPD  2. Atrial fibrillation: First noted in 7/14. DCCV in 8/14 to NSR. Recurred by 9/14. DCCV to NSR 9/14 but recurred.  3. Hyperlipidemia  4. Diastolic CHF: Echo (9/14) with EF 55-60%, grade II diastolic dysfunction, mild LVH, moderate to severe AS with mean gradient 33 mmHg/peak 49 mmHg and AVA 0.74 cm^2.  5. Cardiac cath years ago: No CAD but ?history of coronary anomaly from prior  SH: Lives in [place], retired, nonsmoker (quit 7/14). Lives alone. Daughter come to appointments.  FH: No premature CAD. Breast cancer.  ROS: All systems reviewed and negative except  as per HPI.   Current Outpatient Prescriptions   Medication  Sig  Dispense  Refill   .  ALPRAZolam (XANAX) 0.25 MG tablet  Take 0.25 mg by mouth at bedtime as needed for sleep.     Marland Kitchen  amiodarone (PACERONE) 200 MG tablet  2 tablets (total 400mg ) two times a day  120 tablet  3   .  atorvastatin (LIPITOR) 40 MG tablet  Take 40 mg by mouth daily.     Marland Kitchen  diltiazem (CARDIZEM SR) 120 MG 12 hr capsule  Take 120 mg by mouth 2 (two) times daily.     .  furosemide (LASIX) 40 MG tablet  Take 1 tablet (40 mg total) by mouth daily.  30 tablet  3   .  ipratropium-albuterol (DUONEB) 0.5-2.5 (3) MG/3ML SOLN  Take 3 mLs by nebulization.     .  Multiple Vitamins-Minerals (MULTIVITAMIN WITH MINERALS) tablet  Take 1 tablet by mouth daily.     .  potassium chloride SA (K-DUR,KLOR-CON) 20 MEQ tablet  Take 1 tablet (20 mEq total) by mouth daily.  30 tablet  3   .  Rivaroxaban (XARELTO) 20 MG TABS tablet  Take 20 mg by mouth daily.      No current facility-administered medications for this visit.    BP 100/64  Pulse 142  Wt 84.823 kg (187 lb)  BMI 33.13 kg/m2  General: NAD  Neck: JVP 7 cm, no thyromegaly or thyroid nodule.  Lungs: Clear to auscultation bilaterally with normal respiratory effort.  CV: Nondisplaced PMI. Heart  tachy, irregular S1/S2, no S3/S4, 3/6 systolic murmur RUSB, S2 somewhat obscured. No edema. No carotid bruit. Normal pedal pulses.  Abdomen: Soft, nontender, no hepatosplenomegaly, no distention.  Skin: Intact without lesions or rashes.  Neurologic: Alert and oriented x 3.  Psych: Normal affect.  Extremities: No clubbing or cyanosis.  HEENT: Normal.  Assessment/Plan:  1. Atrial fibrillation: Patient is back in atrial fibrillation after we cardioverted her on 9/16. She felt great when back in NSR but now with significant (NYHA class IV dyspnea). She tolerates atrial fibrillation poorly and is in RVR today.  - I am going to admit her today for rate management. Will likely need diltiazem gtt  but will continue to monitor BP closely. Also continue amiodarone for now.  - Will plan to cardiovert again tomorrow to get temporary control. However, I think she is going to need more definitive treatment => MAZE procedure if aortic valve needs replacement versus ablation.  - Continue Xarelto. Has not missed any doses.  - Patient will eventually need a sleep study.  2. Elevated troponin: Troponin was mildly elevated at hospital in [place] in the setting of atrial fibrillation with RVR. Most likely demand ischemia, no chest pain. No wall motion abnormalities on echo. Will get troponin x 1 today. If she needs AVR, will need pre-op cardiac cath.  3. Chronic diastolic CHF: NYHA class IV symptoms but she actually does not look particularly volume overloaded. I started her on Lasix at last visit. I think main cause of current symptoms is atrial fibrillation with RVR.  - As above, will try to get her out of atrial fibrillation.  - Continue po Lasix.  - Check BMET/BNP today.  4. Aortic stenosis: At least moderate and possibly severe AS by echo. Mean gradient in moderate range but severe by calculated valve area. I am going to do a TEE tomorrow to get a better look at her aortic valve. AVR with MAZE may be needed.   Nicole [name]  02/28/2013

## 2013-02-28 NOTE — Progress Notes (Signed)
Pt successsfully cardioverted by Dr. Eden Emms to NSR. Per our disc, will continue IV amio and change extended-release diltiazem to short-acting 30mg  q8hr (hold for SBP <120). TEE planned for AM, orders written. Dayna Dunn PA-C

## 2013-03-01 ENCOUNTER — Other Ambulatory Visit: Payer: Self-pay

## 2013-03-01 ENCOUNTER — Encounter (HOSPITAL_COMMUNITY): Payer: Medicare Other

## 2013-03-01 ENCOUNTER — Encounter (HOSPITAL_COMMUNITY): Payer: Self-pay

## 2013-03-01 ENCOUNTER — Encounter (HOSPITAL_COMMUNITY)
Admission: AD | Disposition: A | Discharge status: Skilled Nursing Facility | Payer: Self-pay | Source: Ambulatory Visit | Attending: Surgery

## 2013-03-01 DIAGNOSIS — I4891 Unspecified atrial fibrillation: Principal | ICD-10-CM

## 2013-03-01 DIAGNOSIS — Z01810 Encounter for preprocedural cardiovascular examination: Secondary | ICD-10-CM

## 2013-03-01 DIAGNOSIS — I359 Nonrheumatic aortic valve disorder, unspecified: Secondary | ICD-10-CM

## 2013-03-01 DIAGNOSIS — I5033 Acute on chronic diastolic (congestive) heart failure: Secondary | ICD-10-CM

## 2013-03-01 HISTORY — PX: CARDIOVERSION: SHX1299

## 2013-03-01 HISTORY — PX: TEE WITHOUT CARDIOVERSION: SHX5443

## 2013-03-01 LAB — CBC
Hemoglobin: 9.3 g/dL — ABNORMAL LOW (ref 12.0–15.0)
MCH: 29.5 pg (ref 26.0–34.0)
MCV: 88.9 fL (ref 78.0–100.0)
Platelets: 153 10*3/uL (ref 150–400)
RBC: 3.15 MIL/uL — ABNORMAL LOW (ref 3.87–5.11)
RDW: 15.4 % (ref 11.5–15.5)
WBC: 7 10*3/uL (ref 4.0–10.5)

## 2013-03-01 LAB — BASIC METABOLIC PANEL
BUN: 15 mg/dL (ref 6–23)
CO2: 25 mEq/L (ref 19–32)
Calcium: 8.9 mg/dL (ref 8.4–10.5)
Creatinine, Ser: 1.03 mg/dL (ref 0.50–1.10)
GFR calc Af Amer: 60 mL/min — ABNORMAL LOW (ref >90)
GFR calc non Af Amer: 52 mL/min — ABNORMAL LOW (ref >90)
Glucose, Bld: 108 mg/dL — ABNORMAL HIGH (ref 70–99)
Potassium: 4.1 mEq/L (ref 3.5–5.1)
Sodium: 136 mEq/L (ref 135–145)

## 2013-03-01 LAB — GLUCOSE, CAPILLARY: Glucose-Capillary: 117 mg/dL — ABNORMAL HIGH (ref 70–99)

## 2013-03-01 SURGERY — ECHOCARDIOGRAM, TRANSESOPHAGEAL
Anesthesia: Moderate Sedation

## 2013-03-01 MED ORDER — MIDAZOLAM HCL 10 MG/2ML IJ SOLN
INTRAMUSCULAR | Status: DC | PRN
  Administered 2013-03-01: 2 mg via INTRAVENOUS
  Administered 2013-03-01 (×2): 1 mg via INTRAVENOUS

## 2013-03-01 MED ORDER — SODIUM CHLORIDE 0.9 % IJ SOLN: 3.0000 mL | INTRAMUSCULAR | Status: DC | PRN

## 2013-03-01 MED ORDER — ASPIRIN 81 MG PO CHEW
81.0000 mg | CHEWABLE_TABLET | ORAL | Status: AC
  Administered 2013-03-02: 81 mg via ORAL
  Filled 2013-03-01: qty 1

## 2013-03-01 MED ORDER — SODIUM CHLORIDE 0.9 % IJ SOLN
3.0000 mL | Freq: Two times a day (BID) | INTRAMUSCULAR | Status: DC
  Administered 2013-03-01 – 2013-03-04 (×6): 3 mL via INTRAVENOUS

## 2013-03-01 MED ORDER — SODIUM CHLORIDE 0.9 % IV SOLN
1.0000 mL/kg/h | INTRAVENOUS | Status: DC
  Administered 2013-03-02: 1 mL/kg/h via INTRAVENOUS

## 2013-03-01 MED ORDER — DILTIAZEM HCL ER COATED BEADS 120 MG PO CP24
120.0000 mg | ORAL_CAPSULE | Freq: Every day | ORAL | Status: DC
  Administered 2013-03-01 – 2013-03-04 (×4): 120 mg via ORAL
  Filled 2013-03-01 (×5): qty 1

## 2013-03-01 MED ORDER — FENTANYL CITRATE 0.05 MG/ML IJ SOLN
INTRAMUSCULAR | Status: AC
  Filled 2013-03-01: qty 2

## 2013-03-01 MED ORDER — POTASSIUM CHLORIDE CRYS ER 20 MEQ PO TBCR
40.0000 meq | EXTENDED_RELEASE_TABLET | Freq: Once | ORAL | Status: AC
  Administered 2013-03-01: 40 meq via ORAL
  Filled 2013-03-01: qty 2

## 2013-03-01 MED ORDER — AMIODARONE HCL 200 MG PO TABS
200.0000 mg | ORAL_TABLET | Freq: Two times a day (BID) | ORAL | Status: DC
  Administered 2013-03-01 – 2013-03-06 (×9): 200 mg via ORAL
  Filled 2013-03-01 (×13): qty 1

## 2013-03-01 MED ORDER — SODIUM CHLORIDE 0.9 % IV SOLN: 250.0000 mL | INTRAVENOUS | Status: DC | PRN

## 2013-03-01 MED ORDER — FUROSEMIDE 10 MG/ML IJ SOLN
40.0000 mg | Freq: Two times a day (BID) | INTRAMUSCULAR | Status: DC
  Administered 2013-03-01: 40 mg via INTRAVENOUS
  Filled 2013-03-01 (×4): qty 4

## 2013-03-01 MED ORDER — FENTANYL CITRATE 0.05 MG/ML IJ SOLN
INTRAMUSCULAR | Status: DC | PRN
  Administered 2013-03-01 (×2): 25 ug via INTRAVENOUS

## 2013-03-01 MED ORDER — BUTAMBEN-TETRACAINE-BENZOCAINE 2-2-14 % EX AERO
INHALATION_SPRAY | CUTANEOUS | Status: DC | PRN
  Administered 2013-03-01: 2 via TOPICAL

## 2013-03-01 MED ORDER — MIDAZOLAM HCL 5 MG/ML IJ SOLN
INTRAMUSCULAR | Status: AC
  Filled 2013-03-01: qty 2

## 2013-03-01 NOTE — Progress Notes (Signed)
At bedside to complete [place] PFT, Pt uncomfortable with mouthpiece, states it is gagging her but will try to complete test. On first breathing exercise pt had coughing spell with inc SOB with coughing. Pt states she is unable to complete PFT at thiis time.

## 2013-03-01 NOTE — CV Procedure (Signed)
Procedure: TEE  Indication: Aortic stenosis assessment  Sedation: Versed 4 mg IV, Fentanyl 50 mcg IV  Findings: Please see echo section of chart for full report.  Normal LV size and systolic function, EF 55%.  Mild LV hypertrophy with focal basal septal hypertrophy.  Normal RV size and systolic function.  The mitral valve was thickened with minimal stenosis and mild to moderate MR.  It did have a somewhat rheumatic appearance.  The aortic valve was heavily calcified and trileaflet.  There was severe aortic stenosis with mean gradient 43 mmHg and AVA 0.79 cm^2.    No complications.   I will discuss patient with CVTS.  Possible candidate for MAZE-AVR.   [name] 03/01/2013

## 2013-03-01 NOTE — Progress Notes (Signed)
Patient ID: Nicole [name], female   DOB: Nov 19, 1935, 78 y.o.   MRN: [id number]    SUBJECTIVE: Patient was cardioverted yesterday back to NSR.  She feels much better this morning. She has a cough.   Marland Kitchen albuterol  2.5 mg Nebulization TID   And  . ipratropium  0.5 mg Nebulization TID  . amiodarone  200 mg Oral BID  . atorvastatin  40 mg Oral QPM  . diltiazem  120 mg Oral Daily  . furosemide  40 mg Intravenous BID  . multivitamin with minerals  1 tablet Oral Daily  . potassium chloride SA  20 mEq Oral QPM  . potassium chloride  40 mEq Oral Once  . Rivaroxaban  20 mg Oral Q supper  . sodium chloride  3 mL Intravenous Q12H  . sodium chloride  3 mL Intravenous Q12H      Filed Vitals:   02/28/13 2041 03/01/13 0000 03/01/13 0400 03/01/13 0433  BP:  94/49 88/52   Pulse:   64   Temp:  98.8 F (37.1 C) 98.5 F (36.9 C)   TempSrc:  Oral Oral   Resp:   26   Height:      Weight:    84.9 kg (187 lb 2.7 oz)  SpO2: 96%  95%     Intake/Output Summary (Last 24 hours) at 03/01/13 0745 Last data filed at 03/01/13 0600  Gross per 24 hour  Intake 1238.8 ml  Output    800 ml  Net  438.8 ml    LABS: Basic Metabolic Panel:  Recent Labs  40/98/11 1100  NA 136  K 3.8  CL 98  CO2 23  GLUCOSE 120*  BUN 18  CREATININE 1.19*  CALCIUM 9.5   Liver Function Tests:  Recent Labs  02/28/13 1100  AST 23  ALT 32  ALKPHOS 69  BILITOT 0.8  PROT 7.2  ALBUMIN 4.0   No results found for this basename: LIPASE, AMYLASE,  in the last 72 hours CBC:  Recent Labs  02/28/13 1100 03/01/13 0620  WBC 10.9* 7.0  NEUTROABS 8.4*  --   HGB 11.0* 9.3*  HCT 33.5* 28.0*  MCV 88.9 88.9  PLT 197 153   Cardiac Enzymes:  Recent Labs  02/28/13 1100  TROPONINI <0.30   BNP: No components found with this basename: POCBNP,  D-Dimer: No results found for this basename: DDIMER,  in the last 72 hours Hemoglobin A1C: No results found for this basename: HGBA1C,  in the last 72 hours Fasting Lipid  Panel: No results found for this basename: CHOL, HDL, LDLCALC, TRIG, CHOLHDL, LDLDIRECT,  in the last 72 hours Thyroid Function Tests:  Recent Labs  02/28/13 1100  TSH 0.488   Anemia Panel: No results found for this basename: VITAMINB12, FOLATE, FERRITIN, TIBC, IRON, RETICCTPCT,  in the last 72 hours  RADIOLOGY: Dg Chest 2 View  02/28/2013   CLINICAL DATA:  Shortness of breath, atrial fibrillation and rapid ventricular rate.  EXAM: CHEST - 2 VIEW  COMPARISON:  None  FINDINGS: The heart size and mediastinal contours are within normal limits. There is no evidence of pulmonary edema, consolidation, pneumothorax, nodule or pleural fluid. Degenerative changes are present in the spine.  IMPRESSION: No active disease.   Electronically Signed   By: Irish Lack   On: 02/28/2013 16:27    PHYSICAL EXAM General: NAD Neck: JVP 8-9 cm, no thyromegaly or thyroid nodule.  Lungs: Mild crackles at bases.  CV: Nondisplaced PMI.  Heart regular S1/S2, no  S3/S4, 3/6 systolic murmur RUSB, S2 muffled.  No peripheral edema.  No carotid bruit.  Normal pedal pulses.  Abdomen: Soft, nontender, no hepatosplenomegaly, no distention.  Neurologic: Alert and oriented x 3.  Psych: Normal affect. Extremities: No clubbing or cyanosis.   TELEMETRY: Reviewed telemetry pt in NSR  ASSESSMENT AND PLAN: 1. Atrial fibrillation: Patient back in NSR after DCCV yesterday. She tolerates atrial fibrillation poorly and has had recurrences despite amiodarone use.  She is going to need some form of definitive therapy.  - Can transition off IV amiodarone back to po amiodarone today.  - Resume diltiazem CD 120 mg daily.  - Aortic valve evaluation as below => if needs, AVR, would do MAZE.  - Continue Xarelto.  - Patient will eventually need a sleep study.  2. Acute on chronic diastolic CHF:  Symptomatically better now that she is back in NSR.  She does look a bit volume overloaded today.  - Will give IV Lasix today.   3. Aortic  stenosis: At least moderate and possibly severe AS by echo. Mean gradient in moderate range but severe by calculated valve area. I am going to do a TEE today to get a better look at her aortic valve. AVR with MAZE may be needed.  If she needs surgery, will need cardiac cath.   [name] 03/01/2013 7:50 AM

## 2013-03-01 NOTE — Progress Notes (Signed)
  Echocardiogram Echocardiogram Transesophageal has been performed.  Nicole [name] 03/01/2013, 1:28 PM

## 2013-03-01 NOTE — Progress Notes (Addendum)
VASCULAR LAB PRELIMINARY  PRELIMINARY  PRELIMINARY  PRELIMINARY  Pre-op Cardiac Surgery  Carotid Findings:  Bilateral:  1-39% ICA stenosis.  Vertebral artery flow is antegrade.    Nicole [name], RVT 03/01/2013 2:17 PM      Upper Extremity Right Left  Brachial Pressures 101 Triphasic  99 Triphasic   Radial Waveforms Triphasic  Triphasic   Ulnar Waveforms Triphasic  Triphasic   Palmar Arch (Allen's Test) Within normal limits  Within normal limits    Nicole [name], RVT 03/02/2013 12:23 PM

## 2013-03-02 ENCOUNTER — Encounter (HOSPITAL_COMMUNITY): Payer: Self-pay | Admitting: Cardiology

## 2013-03-02 ENCOUNTER — Encounter (HOSPITAL_COMMUNITY)
Admission: AD | Disposition: A | Discharge status: Skilled Nursing Facility | Payer: Self-pay | Source: Ambulatory Visit | Attending: Surgery

## 2013-03-02 DIAGNOSIS — Z01810 Encounter for preprocedural cardiovascular examination: Secondary | ICD-10-CM

## 2013-03-02 DIAGNOSIS — I359 Nonrheumatic aortic valve disorder, unspecified: Secondary | ICD-10-CM

## 2013-03-02 HISTORY — PX: LEFT AND RIGHT HEART CATHETERIZATION WITH CORONARY ANGIOGRAM: SHX5449

## 2013-03-02 LAB — CBC
Hemoglobin: 10 g/dL — ABNORMAL LOW (ref 12.0–15.0)
MCH: 29.2 pg (ref 26.0–34.0)
MCV: 88.6 fL (ref 78.0–100.0)
Platelets: 183 10*3/uL (ref 150–400)
RBC: 3.43 MIL/uL — ABNORMAL LOW (ref 3.87–5.11)
WBC: 7.5 10*3/uL (ref 4.0–10.5)

## 2013-03-02 LAB — APTT: aPTT: 34 seconds (ref 24–37)

## 2013-03-02 LAB — BASIC METABOLIC PANEL
BUN: 17 mg/dL (ref 6–23)
Calcium: 8.6 mg/dL (ref 8.4–10.5)
Creatinine, Ser: 1.1 mg/dL (ref 0.50–1.10)
GFR calc Af Amer: 55 mL/min — ABNORMAL LOW (ref >90)
GFR calc non Af Amer: 48 mL/min — ABNORMAL LOW (ref >90)
Glucose, Bld: 107 mg/dL — ABNORMAL HIGH (ref 70–99)
Potassium: 4 mEq/L (ref 3.5–5.1)
Sodium: 138 mEq/L (ref 135–145)

## 2013-03-02 LAB — POCT I-STAT 3, VENOUS BLOOD GAS (G3P V)
Acid-Base Excess: 2 mmol/L (ref 0.0–2.0)
Bicarbonate: 26.3 mEq/L — ABNORMAL HIGH (ref 20.0–24.0)
O2 Saturation: 55 %
TCO2: 28 mmol/L (ref 0–100)

## 2013-03-02 LAB — POCT I-STAT 3, ART BLOOD GAS (G3+)
Acid-Base Excess: 1 mmol/L (ref 0.0–2.0)
Bicarbonate: 25.2 mEq/L — ABNORMAL HIGH (ref 20.0–24.0)
O2 Saturation: 91 %

## 2013-03-02 LAB — PROTIME-INR: INR: 1.31 (ref 0.00–1.49)

## 2013-03-02 SURGERY — LEFT AND RIGHT HEART CATHETERIZATION WITH CORONARY ANGIOGRAM
Anesthesia: LOCAL

## 2013-03-02 SURGERY — LEFT HEART CATHETERIZATION WITH CORONARY ANGIOGRAM
Anesthesia: LOCAL

## 2013-03-02 MED ORDER — MIDAZOLAM HCL 2 MG/2ML IJ SOLN
INTRAMUSCULAR | Status: AC
  Filled 2013-03-02: qty 2

## 2013-03-02 MED ORDER — ONDANSETRON HCL 4 MG/2ML IJ SOLN: 4.0000 mg | Freq: Four times a day (QID) | INTRAMUSCULAR | Status: DC | PRN

## 2013-03-02 MED ORDER — LIDOCAINE HCL (PF) 1 % IJ SOLN
INTRAMUSCULAR | Status: AC
  Filled 2013-03-02: qty 30

## 2013-03-02 MED ORDER — ACETAMINOPHEN 325 MG PO TABS: 650.0000 mg | ORAL_TABLET | ORAL | Status: DC | PRN

## 2013-03-02 MED ORDER — HEPARIN (PORCINE) IN NACL 2-0.9 UNIT/ML-% IJ SOLN
INTRAMUSCULAR | Status: AC
  Filled 2013-03-02: qty 1000

## 2013-03-02 MED ORDER — VERAPAMIL HCL 2.5 MG/ML IV SOLN
INTRAVENOUS | Status: AC
  Filled 2013-03-02: qty 2

## 2013-03-02 MED ORDER — SODIUM CHLORIDE 0.9 % IV SOLN: INTRAVENOUS | Status: AC

## 2013-03-02 MED ORDER — NITROGLYCERIN 0.2 MG/ML ON CALL CATH LAB
INTRAVENOUS | Status: AC
  Filled 2013-03-02: qty 1

## 2013-03-02 MED ORDER — FENTANYL CITRATE 0.05 MG/ML IJ SOLN
INTRAMUSCULAR | Status: AC
  Filled 2013-03-02: qty 2

## 2013-03-02 MED ORDER — ASPIRIN 81 MG PO CHEW: 324.0000 mg | CHEWABLE_TABLET | Freq: Once | ORAL | Status: DC

## 2013-03-02 MED ORDER — HEPARIN (PORCINE) IN NACL 100-0.45 UNIT/ML-% IJ SOLN
1150.0000 [IU]/h | INTRAMUSCULAR | Status: DC
  Administered 2013-03-02: 1000 [IU]/h via INTRAVENOUS
  Administered 2013-03-03 – 2013-03-04 (×2): 1150 [IU]/h via INTRAVENOUS
  Filled 2013-03-02 (×4): qty 250

## 2013-03-02 NOTE — Progress Notes (Signed)
CARDIAC REHAB PHASE I   PRE:  Rate/Rhythm: 83 SR with PVC    BP: sitting 121/50    SaO2: 93 RA  MODE:  Ambulation: 270 ft   POST:  Rate/Rhythm: 94 SR with PVC    BP: sitting 124/69     SaO2: 97 RA  Pt thankful to walk. Steady with RW. Some SOB. Made her rest x1. HR stable. Began pre-op for [facility] teaching. Gave [facility] booklet and discussed video which she can watch later. Will f/u for mobility. [id number]   Nicole [name] CES, ACSM 03/02/2013 9:50 AM

## 2013-03-02 NOTE — H&P (View-Only) (Signed)
Patient ID: Nicole [name], female   DOB: 06/01/1935, 77 y.o.   MRN: [id number]    SUBJECTIVE: Patient remains in NSR.  TEE yesterday confirmed severe aortic stenosis.    . albuterol  2.5 mg Nebulization TID   And  . ipratropium  0.5 mg Nebulization TID  . amiodarone  200 mg Oral BID  . atorvastatin  40 mg Oral QPM  . diltiazem  120 mg Oral Daily  . multivitamin with minerals  1 tablet Oral Daily  . potassium chloride SA  20 mEq Oral QPM  . sodium chloride  3 mL Intravenous Q12H  . sodium chloride  3 mL Intravenous Q12H  . sodium chloride  3 mL Intravenous Q12H      Filed Vitals:   03/02/13 0400 03/02/13 0500 03/02/13 0600 03/02/13 0720  BP:      Pulse: 78     Temp:    98.6 F (37 C)  TempSrc:    Oral  Resp: 16     Height:      Weight:  84.2 kg (185 lb 10 oz) 84.3 kg (185 lb 13.6 oz)   SpO2: 95%       Intake/Output Summary (Last 24 hours) at 03/02/13 0734 Last data filed at 03/02/13 0514  Gross per 24 hour  Intake    480 ml  Output   1050 ml  Net   -570 ml    LABS: Basic Metabolic Panel:  Recent Labs  03/01/13 0620 03/02/13 0416  NA 136 138  K 4.1 4.0  CL 101 102  CO2 25 26  GLUCOSE 108* 107*  BUN 15 17  CREATININE 1.03 1.10  CALCIUM 8.9 8.6   Liver Function Tests:  Recent Labs  02/28/13 1100  AST 23  ALT 32  ALKPHOS 69  BILITOT 0.8  PROT 7.2  ALBUMIN 4.0   No results found for this basename: LIPASE, AMYLASE,  in the last 72 hours CBC:  Recent Labs  02/28/13 1100 03/01/13 0620 03/02/13 0500  WBC 10.9* 7.0 7.5  NEUTROABS 8.4*  --   --   HGB 11.0* 9.3* 10.0*  HCT 33.5* 28.0* 30.4*  MCV 88.9 88.9 88.6  PLT 197 153 183   Cardiac Enzymes:  Recent Labs  02/28/13 1100  TROPONINI <0.30   BNP: No components found with this basename: POCBNP,  D-Dimer: No results found for this basename: DDIMER,  in the last 72 hours Hemoglobin A1C: No results found for this basename: HGBA1C,  in the last 72 hours Fasting Lipid Panel: No results found  for this basename: CHOL, HDL, LDLCALC, TRIG, CHOLHDL, LDLDIRECT,  in the last 72 hours Thyroid Function Tests:  Recent Labs  02/28/13 1100  TSH 0.488   Anemia Panel: No results found for this basename: VITAMINB12, FOLATE, FERRITIN, TIBC, IRON, RETICCTPCT,  in the last 72 hours  RADIOLOGY: Dg Chest 2 View  02/28/2013   CLINICAL DATA:  Shortness of breath, atrial fibrillation and rapid ventricular rate.  EXAM: CHEST - 2 VIEW  COMPARISON:  None  FINDINGS: The heart size and mediastinal contours are within normal limits. There is no evidence of pulmonary edema, consolidation, pneumothorax, nodule or pleural fluid. Degenerative changes are present in the spine.  IMPRESSION: No active disease.   Electronically Signed   By: Glenn  Yamagata   On: 02/28/2013 16:27    PHYSICAL EXAM General: NAD Neck: JVP 8-9 cm, no thyromegaly or thyroid nodule.  Lungs: Mild crackles at bases.  CV: Nondisplaced PMI.  Heart regular   S1/S2, no S3/S4, 3/6 systolic murmur RUSB, S2 muffled.  No peripheral edema.  No carotid bruit.  Normal pedal pulses.  Abdomen: Soft, nontender, no hepatosplenomegaly, no distention.  Neurologic: Alert and oriented x 3.  Psych: Normal affect. Extremities: No clubbing or cyanosis.   TELEMETRY: Reviewed telemetry pt in NSR  ASSESSMENT AND PLAN: 1. Atrial fibrillation: Patient back in NSR after DCCV. She tolerates atrial fibrillation poorly and has had recurrences despite amiodarone use.  She is going to need some form of definitive therapy.  - Continue po amiodarone and diltiazem CD.  - Xarelto held for 1 dose for cardiac cath => will try to use brachial/radial access for LHC/RHC. - Patient will eventually need a sleep study.  2. Acute on chronic diastolic CHF:  Symptomatically better now that she is back in NSR.  She does look a bit volume overloaded today.  - Will give IV Lasix after cath.   3. Aortic stenosis: Severe by TEE.  Possibly rheumatic heart disease plays a role given  appearance of mitral valve. Needs evaluation for AVR with MAZE. To be seen by CVTS today.  Will do right and left heart cath, attempting brachial RHC/radial LHC today.    Colten Desroches 03/02/2013 7:34 AM    

## 2013-03-02 NOTE — Progress Notes (Signed)
ANTICOAGULATION CONSULT NOTE - Initial Consult  Pharmacy Consult for heparin Indication: atrial fibrillation  No Known Allergies  Patient Measurements: Height: 5\' 3"  (160 cm) Weight: 185 lb 13.6 oz (84.3 kg) IBW/kg (Calculated) : 52.4 Heparin Dosing Weight: 80kg  Vital Signs: Temp: 98.2 F (36.8 C) (10/03 1215) Temp src: Oral (10/03 1215) BP: 110/89 mmHg (10/03 1400) Pulse Rate: 77 (10/03 1400)  Labs:  Recent Labs  02/28/13 1100 03/01/13 0620 03/02/13 0416 03/02/13 0500  HGB 11.0* 9.3*  --  10.0*  HCT 33.5* 28.0*  --  30.4*  PLT 197 153  --  183  APTT  --   --  34  --   LABPROT  --   --  16.0*  --   INR  --   --  1.31  --   CREATININE 1.19* 1.03 1.10  --   TROPONINI <0.30  --   --   --     Estimated Creatinine Clearance: 45.5 ml/min (by C-G formula based on Cr of 1.1).   Medical History: Past Medical History  Diagnosis Date  . SOB (shortness of breath)     conplicated by underlying a fib with RVR,COPD, and CHF  . Atrial fibrillation     with Rapid Ventricular response  . Anxiety   . Hyperlipidemia   . COPD (chronic obstructive pulmonary disease)   . Diastolic CHF     Medications:  Xarelto 20mg  daily (last dose 11/1)  Assessment: 77 year old female with history of afib on xarelto prior to admission. She was recently cardioverted on 9/16, TEE on 10/2 showed severe AS and moderate MR. Patient is now s/p cath with plans for AVR and MAZE procedure Monday.   D/w Dr.McLean and given recent cardioversion and chronic anticoagulation that stopped on Wednesday will start IV heparin bridge later tonight as opposed to tomorrow. Patient's nurse reporting that groin site without complication or bleeding this afternoon.  Goal of Therapy:  Heparin level 0.3-0.7 units/ml Monitor platelets by anticoagulation protocol: Yes   Plan:  Start heparin infusion at 1000 units/hr Check anti-Xa level in 8 hours and daily while on heparin Continue to monitor H&H and  platelets Surgery planned for monday  Sheppard Coil PharmD., BCPS Clinical Pharmacist Pager [phone] 03/02/2013 3:24 PM

## 2013-03-02 NOTE — Interval H&P Note (Signed)
Cath Lab Visit (complete for each Cath Lab visit)  Clinical Evaluation Leading to the Procedure:   ACS: no  Non-ACS:    Anginal Classification: CCS III  Anti-ischemic medical therapy: Maximal Therapy (2 or more classes of medications)  Non-Invasive Test Results: No non-invasive testing performed  Prior CABG: No previous CABG      History and Physical Interval Note:  03/02/2013 10:24 AM  [name]  has presented today for surgery, with the diagnosis of AS  The various methods of treatment have been discussed with the patient and family. After consideration of risks, benefits and other options for treatment, the patient has consented to  Procedure(s): LEFT AND RIGHT HEART CATHETERIZATION WITH CORONARY ANGIOGRAM (N/A) as a surgical intervention .  The patient's history has been reviewed, patient examined, no change in status, stable for surgery.  I have reviewed the patient's chart and labs.  Questions were answered to the patient's satisfaction.     Nicole [name] [employer]

## 2013-03-02 NOTE — Consult Note (Signed)
[address]       [place] [address]             [phone]      Cardiothoracic Surgery Consultation  Reason for Consult: [name] aortic stenosis, Paroxysmal atrial fibrillation Referring Physician: Dr. [name]  Nicole [name] is an 77 y.o. female.  HPI:   77 year old woman with hx of smoking, COPD, hyperlipidemia who reports developing shortness of breath in late July. She was noted to be in atrial fibrillation for the first time and was hospitalized twice in [place] and cardioverted. She was treated with amiodarone. She was seen by Dr. Shirlee Latch and was back in A-fib and cardioverted again on 02/13/2013. She said she felt much better but then on Monday became short of breath again with minimal activity with tachypalpitations. She had a 2D echo on 02/13/2013 showing moderate to [name] aortic stenosis with AVA of 0.51 with a mean gradient of 31 and a peak of 46. A TEE on 03/01/2013 showed [name] AS with a mean gradient of 43 and AVA of 0.79 with a heavily calcified aortic valve. There is mild to moderate MR with a thickened valve. 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  Past Medical History  Diagnosis Date  . SOB (shortness of breath)     conplicated by underlying a fib with RVR,COPD, and CHF  . Atrial fibrillation     with Rapid Ventricular response  . Anxiety   . Hyperlipidemia   . COPD (chronic obstructive pulmonary disease)   . Diastolic CHF     Past Surgical History  Procedure Laterality Date  . Abdominal hysterectomy    . Cataract extraction    . Tonsillectomy    . Rotator cuff repair    . Breast biopsy      x3  . Cardioversion N/A 02/13/2013    Procedure: CARDIOVERSION;  Surgeon: Laurey Morale, MD;  Location: [facility] ENDOSCOPY;  Service: Cardiovascular;  Laterality: N/A;  . Tee without cardioversion N/A 03/01/2013    Procedure: TRANSESOPHAGEAL ECHOCARDIOGRAM (TEE);   Surgeon: Laurey Morale, MD;  Location: [facility] ENDOSCOPY;  Service: Cardiovascular;  Laterality: N/A;  talked to bev. booking no. is [phone] called trish to verify time  . Cardioversion N/A 03/01/2013    Procedure: CARDIOVERSION;  Surgeon: Laurey Morale, MD;  Location: [facility] ENDOSCOPY;  Service: Cardiovascular;  Laterality: N/A;    Family History  Problem Relation Age of Onset  . Breast cancer      family history    Social History:  reports that she has quit smoking. She does not have any smokeless tobacco history on file. She reports that she does not drink alcohol or use illicit drugs.  Allergies: No Known Allergies  Medications:  I have reviewed the patient's current medications. Prior to Admission:  Prescriptions prior to admission  Medication Sig Dispense Refill  . ALPRAZolam (XANAX) 0.25 MG tablet Take 0.25 mg by mouth at bedtime as needed for sleep.      Marland Kitchen amiodarone (PACERONE) 200 MG tablet Take 400 mg by mouth daily.       Marland Kitchen diltiazem (CARDIZEM SR) 120 MG 12 hr capsule Take 120 mg by mouth 2 (two) times daily.      . furosemide (LASIX) 40 MG tablet Take 1 tablet (40 mg total) by mouth daily.  30 tablet  3  . ipratropium-albuterol (DUONEB) 0.5-2.5 (3)  MG/3ML SOLN Take 3 mLs by nebulization 3 (three) times daily.       . Multiple Vitamins-Minerals (MULTIVITAMIN WITH MINERALS) tablet Take 1 tablet by mouth daily.      . potassium chloride SA ([name]-DUR,KLOR-CON) 20 MEQ tablet Take 20 mEq by mouth every evening.      . Rivaroxaban (XARELTO) 20 MG TABS tablet Take 20 mg by mouth every evening.       . [DISCONTINUED] potassium chloride SA ([name]-DUR,KLOR-CON) 20 MEQ tablet Take 1 tablet (20 mEq total) by mouth daily.  30 tablet  3  . atorvastatin (LIPITOR) 40 MG tablet Take 40 mg by mouth every evening.        Scheduled: . albuterol  2.5 mg Nebulization TID   And  . ipratropium  0.5 mg Nebulization TID  . amiodarone  200 mg Oral BID  . atorvastatin  40 mg Oral QPM  . diltiazem  120 mg Oral  Daily  . multivitamin with minerals  1 tablet Oral Daily  . potassium chloride SA  20 mEq Oral QPM  . sodium chloride  3 mL Intravenous Q12H  . sodium chloride  3 mL Intravenous Q12H  . sodium chloride  3 mL Intravenous Q12H   Continuous: . sodium chloride 10 mL/hr at 02/28/13 2019  . sodium chloride Stopped (02/28/13 1658)  . sodium chloride 75 mL/hr at 03/02/13 1200   AVW:UJWJXB chloride, sodium chloride, acetaminophen, ALPRAZolam, ondansetron (ZOFRAN) IV, sodium chloride, sodium chloride, sodium chloride  Results for orders placed during the hospital encounter of 02/28/13 (from the past 48 hour(s))  GLUCOSE, CAPILLARY     Status: Abnormal   Collection Time    02/28/13  9:49 PM      Result Value Range   Glucose-Capillary 101 (*) 70 - 99 mg/dL   Comment 1 Documented in Chart     Comment 2 Notify RN    BASIC METABOLIC PANEL     Status: Abnormal   Collection Time    03/01/13  6:20 AM      Result Value Range   Sodium 136  135 - 145 mEq/L   Potassium 4.1  3.5 - 5.1 mEq/L   Chloride 101  96 - 112 mEq/L   CO2 25  19 - 32 mEq/L   Glucose, Bld 108 (*) 70 - 99 mg/dL   BUN 15  6 - 23 mg/dL   Creatinine, Ser 1.47  0.50 - 1.10 mg/dL   Calcium 8.9  8.4 - 82.9 mg/dL   GFR calc non Af Amer 52 (*) >90 mL/min   GFR calc Af Amer 60 (*) >90 mL/min   Comment: (NOTE)     The eGFR has been calculated using the CKD EPI equation.     This calculation has not been validated in all clinical situations.     eGFR's persistently <90 mL/min signify possible Chronic Kidney     Disease.  CBC     Status: Abnormal   Collection Time    03/01/13  6:20 AM      Result Value Range   WBC 7.0  4.0 - 10.5 [name]/uL   RBC 3.15 (*) 3.87 - 5.11 MIL/uL   Hemoglobin 9.3 (*) 12.0 - 15.0 g/dL   HCT 56.2 (*) 13.0 - 86.5 %   MCV 88.9  78.0 - 100.0 fL   MCH 29.5  26.0 - 34.0 pg   MCHC 33.2  30.0 - 36.0 g/dL   RDW 78.4  69.6 - 29.5 %   Platelets 153  150 - 400 [name]/uL  GLUCOSE, CAPILLARY     Status: Abnormal   Collection  Time    03/01/13  8:08 AM      Result Value Range   Glucose-Capillary 117 (*) 70 - 99 mg/dL  BASIC METABOLIC PANEL     Status: Abnormal   Collection Time    03/02/13  4:16 AM      Result Value Range   Sodium 138  135 - 145 mEq/L   Potassium 4.0  3.5 - 5.1 mEq/L   Chloride 102  96 - 112 mEq/L   CO2 26  19 - 32 mEq/L   Glucose, Bld 107 (*) 70 - 99 mg/dL   BUN 17  6 - 23 mg/dL   Creatinine, Ser 1.47  0.50 - 1.10 mg/dL   Calcium 8.6  8.4 - 82.9 mg/dL   GFR calc non Af Amer 48 (*) >90 mL/min   GFR calc Af Amer 55 (*) >90 mL/min   Comment: (NOTE)     The eGFR has been calculated using the CKD EPI equation.     This calculation has not been validated in all clinical situations.     eGFR's persistently <90 mL/min signify possible Chronic Kidney     Disease.  APTT     Status: None   Collection Time    03/02/13  4:16 AM      Result Value Range   aPTT 34  24 - 37 seconds  PROTIME-INR     Status: Abnormal   Collection Time    03/02/13  4:16 AM      Result Value Range   Prothrombin Time 16.0 (*) 11.6 - 15.2 seconds   INR 1.31  0.00 - 1.49  CBC     Status: Abnormal   Collection Time    03/02/13  5:00 AM      Result Value Range   WBC 7.5  4.0 - 10.5 [name]/uL   RBC 3.43 (*) 3.87 - 5.11 MIL/uL   Hemoglobin 10.0 (*) 12.0 - 15.0 g/dL   HCT 56.2 (*) 13.0 - 86.5 %   MCV 88.6  78.0 - 100.0 fL   MCH 29.2  26.0 - 34.0 pg   MCHC 32.9  30.0 - 36.0 g/dL   RDW 78.4  69.6 - 29.5 %   Platelets 183  150 - 400 [name]/uL    No results found.  Review of Systems  Constitutional: Positive for malaise/fatigue. Negative for fever, chills, weight loss and diaphoresis.  HENT: Negative.   Eyes: Negative.   Respiratory: Positive for shortness of breath.        Exertional  Cardiovascular: Positive for palpitations and leg swelling. Negative for chest pain, orthopnea and PND.  Gastrointestinal: Negative.   Genitourinary: Negative.   Musculoskeletal: Negative.   Skin: Negative.   Neurological: Negative for  dizziness.  Endo/Heme/Allergies: Negative.   Psychiatric/Behavioral: Negative.    Blood pressure 110/89, pulse 77, temperature 98.2 F (36.8 C), temperature source Oral, resp. rate 23, height 5\' 3"  (1.6 m), weight 84.3 kg (185 lb 13.6 oz), SpO2 97.00%. Physical Exam  Constitutional: She is oriented to person, place, and time. She appears well-developed and well-nourished. No distress.  HENT:  Head: Normocephalic and atraumatic.  Mouth/Throat: Oropharynx is clear and moist.  Eyes: Conjunctivae and EOM are normal. Pupils are equal, round, and reactive to light.  Neck: Normal range of motion. No JVD present. No thyromegaly present.  Cardiovascular: Normal rate, regular rhythm and intact distal pulses.   Murmur  heard. 3/6 systolic murmur over aorta  Respiratory: Breath sounds normal. No respiratory distress. She has no rales.  GI: Soft. Bowel sounds are normal. She exhibits no distension and no mass. There is no tenderness.  Musculoskeletal: Normal range of motion. She exhibits no edema.  Neurological: She is alert and oriented to person, place, and time. No cranial nerve deficit.  Skin: Skin is warm and dry. No erythema.  Psychiatric: She has a normal mood and affect.   Procedure: TEE  Indication: Aortic stenosis assessment  Sedation: Versed 4 mg IV, Fentanyl 50 mcg IV  Findings: Please see echo section of chart for full report. Normal LV size and systolic function, EF 55%. Mild LV hypertrophy with focal basal septal hypertrophy. Normal RV size and systolic function. The mitral valve was thickened with minimal stenosis and mild to moderate MR. It did have a somewhat rheumatic appearance. The aortic valve was heavily calcified and trileaflet. There was [name] aortic stenosis with mean gradient 43 mmHg and AVA 0.79 cm^2.  No complications.  I will discuss patient with CVTS. Possible candidate for MAZE-AVR.  [name]  03/01/2013   Cardiac Catheterization Procedure Note   Name: Nicole [name]  MRN: [id number]  DOB: 1935-11-05  Procedure: Right Heart Cath, Selective Coronary Angiography  Indication: Pre-MAZE/AVR surgery.  Procedural Details: The right groin was prepped, draped, and anesthetized with 1% lidocaine. Using the modified Seldinger technique a 5 French sheath was placed in the right femoral artery and a 7 French sheath was placed in the right femoral vein. A Swan-Ganz catheter was used for the right heart catheterization. Standard protocol was followed for recording of right heart pressures and sampling of oxygen saturations. Fick cardiac output was calculated. Standard Judkins catheters were used for selective coronary angiography. There were no immediate procedural complications. The patient was transferred to the post catheterization recovery area for further monitoring.  Procedural Findings:  Hemodynamics (mmHg)  RA mean 4  RV 29/2  PA 29/8  PCWP mean 12  AO 98/54  Oxygen saturations:  PA 55%  AO 91%  Cardiac Output (Fick) 5.11  Cardiac Index (Fick) 2.72  Coronary angiography:  Coronary dominance: right  Left mainstem: Anomalous origin off the right cusp. No plaque or stenosis. No extrinsic compression noted. Bifurcates into LAD and LCx at what appears to be the typical location.  Left anterior descending (LAD): No plaque or stenosis, follows normal course.  Left circumflex (LCx): No plaque or stenosis, follows normal course.  Right coronary artery (RCA): Normal origin, no plaque or stenosis.  Left ventriculography: Not done, [name] AS.  Final Conclusions: No angiographic CAD. 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 At times, this is a malignant anomaly due to compression between PA and aorta or intramural course. However, this patient has had no problems with this over 77 years so my suspicion is that it is not a high risk for her. Would need coronary CT or visualization at time of surgery to see the exact track the vessel takes (intramural or  between PA and aorta). Normal filling pressures, does not need further diuresis. Given recent DCCV, I will restart heparin gtt in am tomorrow (off Xarelto since Wednesday pm) and continue until surgery.  Recommendations: Surgical evaluation from Dr. Laneta Simmers for MAZE/AVR.  [name]  03/02/2013, 11:05 AM     Assessment/Plan:  She has [name] symptomatic AS with paroxysmal atrial fibrillation which is probably making her symptoms worse. Her daughter is an ICU nurse here and has noticed  her shortness of breath and fatigue since March. I think the A-fib threw her over the edge. I agree with the need for AVR and MAZE. She does not tolerate the A-fib and has had recurrent A-fib despite amiodarone. She does have some MR but her valve is thickened and somewhat rheumatic appearing and repair may not change that. I don' t think mitral valve replacement is indicated so I would probably leave that alone. I discussed the aortic valve replacement using a tissue valve and the MAZE procedure in detail with the patient and her daughter. I told them it is about 80% effective at preventing A-fib. I discussed the operative procedure with the patient and daughter including alternatives, benefits and risks; including but not limited to bleeding, blood transfusion, infection, stroke, myocardial infarction, graft failure, heart block requiring a permanent pacemaker, organ dysfunction, and death.  Nicole [name] understands and agrees to proceed.  We will schedule surgery for Monday.  Nicole [name] 03/02/2013, 2:17 PM

## 2013-03-02 NOTE — Progress Notes (Signed)
Patient ID: Nicole [name], female   DOB: Nov 19, 1935, 77 y.o.   MRN: [id number]    SUBJECTIVE: Patient remains in NSR.  TEE yesterday confirmed severe aortic stenosis.    Marland Kitchen albuterol  2.5 mg Nebulization TID   And  . ipratropium  0.5 mg Nebulization TID  . amiodarone  200 mg Oral BID  . atorvastatin  40 mg Oral QPM  . diltiazem  120 mg Oral Daily  . multivitamin with minerals  1 tablet Oral Daily  . potassium chloride SA  20 mEq Oral QPM  . sodium chloride  3 mL Intravenous Q12H  . sodium chloride  3 mL Intravenous Q12H  . sodium chloride  3 mL Intravenous Q12H      Filed Vitals:   03/02/13 0400 03/02/13 0500 03/02/13 0600 03/02/13 0720  BP:      Pulse: 78     Temp:    98.6 F (37 C)  TempSrc:    Oral  Resp: 16     Height:      Weight:  84.2 kg (185 lb 10 oz) 84.3 kg (185 lb 13.6 oz)   SpO2: 95%       Intake/Output Summary (Last 24 hours) at 03/02/13 0734 Last data filed at 03/02/13 0514  Gross per 24 hour  Intake    480 ml  Output   1050 ml  Net   -570 ml    LABS: Basic Metabolic Panel:  Recent Labs  78/29/56 0620 03/02/13 0416  NA 136 138  K 4.1 4.0  CL 101 102  CO2 25 26  GLUCOSE 108* 107*  BUN 15 17  CREATININE 1.03 1.10  CALCIUM 8.9 8.6   Liver Function Tests:  Recent Labs  02/28/13 1100  AST 23  ALT 32  ALKPHOS 69  BILITOT 0.8  PROT 7.2  ALBUMIN 4.0   No results found for this basename: LIPASE, AMYLASE,  in the last 72 hours CBC:  Recent Labs  02/28/13 1100 03/01/13 0620 03/02/13 0500  WBC 10.9* 7.0 7.5  NEUTROABS 8.4*  --   --   HGB 11.0* 9.3* 10.0*  HCT 33.5* 28.0* 30.4*  MCV 88.9 88.9 88.6  PLT 197 153 183   Cardiac Enzymes:  Recent Labs  02/28/13 1100  TROPONINI <0.30   BNP: No components found with this basename: POCBNP,  D-Dimer: No results found for this basename: DDIMER,  in the last 72 hours Hemoglobin A1C: No results found for this basename: HGBA1C,  in the last 72 hours Fasting Lipid Panel: No results found  for this basename: CHOL, HDL, LDLCALC, TRIG, CHOLHDL, LDLDIRECT,  in the last 72 hours Thyroid Function Tests:  Recent Labs  02/28/13 1100  TSH 0.488   Anemia Panel: No results found for this basename: VITAMINB12, FOLATE, FERRITIN, TIBC, IRON, RETICCTPCT,  in the last 72 hours  RADIOLOGY: Dg Chest 2 View  02/28/2013   CLINICAL DATA:  Shortness of breath, atrial fibrillation and rapid ventricular rate.  EXAM: CHEST - 2 VIEW  COMPARISON:  None  FINDINGS: The heart size and mediastinal contours are within normal limits. There is no evidence of pulmonary edema, consolidation, pneumothorax, nodule or pleural fluid. Degenerative changes are present in the spine.  IMPRESSION: No active disease.   Electronically Signed   By: Irish Lack   On: 02/28/2013 16:27    PHYSICAL EXAM General: NAD Neck: JVP 8-9 cm, no thyromegaly or thyroid nodule.  Lungs: Mild crackles at bases.  CV: Nondisplaced PMI.  Heart regular  S1/S2, no S3/S4, 3/6 systolic murmur RUSB, S2 muffled.  No peripheral edema.  No carotid bruit.  Normal pedal pulses.  Abdomen: Soft, nontender, no hepatosplenomegaly, no distention.  Neurologic: Alert and oriented x 3.  Psych: Normal affect. Extremities: No clubbing or cyanosis.   TELEMETRY: Reviewed telemetry pt in NSR  ASSESSMENT AND PLAN: 1. Atrial fibrillation: Patient back in NSR after DCCV. She tolerates atrial fibrillation poorly and has had recurrences despite amiodarone use.  She is going to need some form of definitive therapy.  - Continue po amiodarone and diltiazem CD.  - Xarelto held for 1 dose for cardiac cath => will try to use brachial/radial access for LHC/RHC. - Patient will eventually need a sleep study.  2. Acute on chronic diastolic CHF:  Symptomatically better now that she is back in NSR.  She does look a bit volume overloaded today.  - Will give IV Lasix after cath.   3. Aortic stenosis: Severe by TEE.  Possibly rheumatic heart disease plays a role given  appearance of mitral valve. Needs evaluation for AVR with MAZE. To be seen by CVTS today.  Will do right and left heart cath, attempting brachial RHC/radial LHC today.    [name] 03/02/2013 7:34 AM

## 2013-03-02 NOTE — CV Procedure (Signed)
   Cardiac Catheterization Procedure Note  Name: Nicole [name] MRN: [id number] DOB: 03-04-1936  Procedure: Right Heart Cath,  Selective Coronary Angiography  Indication: Pre-MAZE/AVR surgery.    Procedural Details: The right groin was prepped, draped, and anesthetized with 1% lidocaine. Using the modified Seldinger technique a 5 French sheath was placed in the right femoral artery and a 7 French sheath was placed in the right femoral vein. A Swan-Ganz catheter was used for the right heart catheterization. Standard protocol was followed for recording of right heart pressures and sampling of oxygen saturations. Fick cardiac output was calculated. Standard Judkins catheters were used for selective coronary angiography. There were no immediate procedural complications. The patient was transferred to the post catheterization recovery area for further monitoring.  Procedural Findings: Hemodynamics (mmHg) RA mean 4 RV 29/2 PA 29/8 PCWP mean 12 AO 98/54  Oxygen saturations: PA 55% AO 91%  Cardiac Output (Fick) 5.11  Cardiac Index (Fick) 2.72   Coronary angiography: Coronary dominance: right  Left mainstem: Anomalous origin off the right cusp.  No plaque or stenosis.  No extrinsic compression noted. Bifurcates into LAD and LCx at what appears to be the typical location.   Left anterior descending (LAD): No plaque or stenosis, follows normal course.  Left circumflex (LCx): No plaque or stenosis, follows normal course.   Right coronary artery (RCA): Normal origin, no plaque or stenosis.   Left ventriculography: Not done, severe AS.   Final Conclusions:  No angiographic CAD.  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  At times, this is a malignant anomaly due to compression between PA and aorta or intramural course.  However, this patient has had no problems with this over 75 years so my suspicion is that it is not a high risk for her.  Would need coronary CT or visualization at time  of surgery to see the exact track the vessel takes (intramural or between PA and aorta). Normal filling pressures, does not need further diuresis.  Given recent DCCV, I will restart heparin gtt in am tomorrow (off Xarelto since Wednesday pm) and continue until surgery.   Recommendations:  Surgical evaluation from Dr. Laneta Simmers for MAZE/AVR.    [name] 03/02/2013, 11:05 AM

## 2013-03-03 DIAGNOSIS — I2510 Atherosclerotic heart disease of native coronary artery without angina pectoris: Secondary | ICD-10-CM

## 2013-03-03 LAB — BASIC METABOLIC PANEL
BUN: 13 mg/dL (ref 6–23)
CO2: 26 mEq/L (ref 19–32)
Chloride: 103 mEq/L (ref 96–112)
Creatinine, Ser: 0.98 mg/dL (ref 0.50–1.10)
Glucose, Bld: 107 mg/dL — ABNORMAL HIGH (ref 70–99)
Potassium: 4.3 mEq/L (ref 3.5–5.1)

## 2013-03-03 LAB — HEPARIN LEVEL (UNFRACTIONATED)
Heparin Unfractionated: 0.38 IU/mL (ref 0.30–0.70)
Heparin Unfractionated: 0.39 IU/mL (ref 0.30–0.70)

## 2013-03-03 LAB — CBC
HCT: 27.8 % — ABNORMAL LOW (ref 36.0–46.0)
Hemoglobin: 9.3 g/dL — ABNORMAL LOW (ref 12.0–15.0)
MCH: 29.5 pg (ref 26.0–34.0)
MCHC: 33.5 g/dL (ref 30.0–36.0)
MCV: 88.3 fL (ref 78.0–100.0)
RBC: 3.15 MIL/uL — ABNORMAL LOW (ref 3.87–5.11)

## 2013-03-03 NOTE — Progress Notes (Signed)
CARDIAC REHAB PHASE I   PRE:  Rate/Rhythm: 83 SR with PVC  BP:  Sitting: 107/39     SaO2: 98 RA  MODE:  Ambulation: 300 ft   POST:  Rate/Rhythm: 98 SR with PVC  BP:  Sitting: 111/44    SaO2: 98 RA  Pt walked 357ft with RW and assist x1.  Pt c/o SOB, but no rest breaks were needed.  Reviewed IS with pt and informed the pt of video.  Pt stated she wanted to wait on her daughter to arrive before she watched the video because she would be helping her post-op.  Pt verbalized she had the video information.  Returned pt to bed with water, call button and phone. [phone]  Marvene Staff MS, ACSM RCEP 11:19 AM 03/03/2013

## 2013-03-03 NOTE — Progress Notes (Signed)
ANTICOAGULATION CONSULT NOTE - Initial Consult  Pharmacy Consult for heparin Indication: atrial fibrillation  No Known Allergies  Patient Measurements: Height: 5\' 3"  (160 cm) Weight: 187 lb 9.8 oz (85.1 kg) IBW/kg (Calculated) : 52.4 Heparin Dosing Weight: 80kg  Vital Signs: Temp: 98.4 F (36.9 C) (10/04 0413) Temp src: Oral (10/04 0413) BP: 115/58 mmHg (10/03 2329) Pulse Rate: 78 (10/03 2329)  Labs:  Recent Labs  02/28/13 1100 03/01/13 0620 03/02/13 0416 03/02/13 0500 03/03/13 0500  HGB 11.0* 9.3*  --  10.0* 9.3*  HCT 33.5* 28.0*  --  30.4* 27.8*  PLT 197 153  --  183 169  APTT  --   --  34  --  43*  LABPROT  --   --  16.0*  --   --   INR  --   --  1.31  --   --   HEPARINUNFRC  --   --   --   --  0.26*  CREATININE 1.19* 1.03 1.10  --   --   TROPONINI <0.30  --   --   --   --     Estimated Creatinine Clearance: 45.7 ml/min (by C-G formula based on Cr of 1.1).   Medical History: Past Medical History  Diagnosis Date  . SOB (shortness of breath)     conplicated by underlying a fib with RVR,COPD, and CHF  . Atrial fibrillation     with Rapid Ventricular response  . Anxiety   . Hyperlipidemia   . COPD (chronic obstructive pulmonary disease)   . Diastolic CHF    Assessment: 77 year old female with history of afib, Xarelto on hold, for heparin  Goal of Therapy:  Heparin level 0.3-0.7 units/ml Monitor platelets by anticoagulation protocol: Yes   Plan:  Increase Heparin 1150 units/hr Check heparin level in 8 hours.  Geannie Risen, PharmD, BCPS   03/03/2013 6:01 AM

## 2013-03-03 NOTE — Progress Notes (Signed)
ANTICOAGULATION CONSULT NOTE - Follow Up Consult  Pharmacy Consult for Heparin Indication: atrial fibrillation  No Known Allergies  Patient Measurements: Height: 5\' 3"  (160 cm) Weight: 187 lb 9.8 oz (85.1 kg) IBW/kg (Calculated) : 52.4 Heparin Dosing Weight: 80kg  Vital Signs: Temp: 98.8 F (37.1 C) (10/04 1126) Temp src: Oral (10/04 1126) BP: 97/43 mmHg (10/04 1126) Pulse Rate: 78 (10/04 1200)  Labs:  Recent Labs  03/01/13 0620 03/02/13 0416 03/02/13 0500 03/03/13 0500  HGB 9.3*  --  10.0* 9.3*  HCT 28.0*  --  30.4* 27.8*  PLT 153  --  183 169  APTT  --  34  --  43*  LABPROT  --  16.0*  --   --   INR  --  1.31  --   --   HEPARINUNFRC  --   --   --  0.26*  CREATININE 1.03 1.10  --  0.98    Estimated Creatinine Clearance: 51.3 ml/min (by C-G formula based on Cr of 0.98).   Medications:  Heparin @ 1150 units/hr  Assessment: 77yof with history of afib on xarelto pta. She was recently cardioverted on 9/16, TEE on 10/2 showed severe AS and moderate MR. She is now s/p cath with plans for AVR and MAZE procedure on Monday 10/6. Heparin bridge started last evening. Heparin level is therapeutic after rate increase this morning. Appears effect of xarelto is gone. Small drop noted in hgb/hct/plt. No bleeding reported.  Goal of Therapy:  Heparin level 0.3-0.7 units/ml Monitor platelets by anticoagulation protocol: Yes   Plan:  1) Continue heparin at 1150 units/hr 2) Heparin level in 8 hours to confirm  Fredrik Rigger 03/03/2013,2:36 PM

## 2013-03-03 NOTE — Progress Notes (Signed)
TELEMETRY: Reviewed telemetry pt in NSR: Filed Vitals:   03/02/13 2329 03/03/13 0413 03/03/13 0416 03/03/13 0737  BP: 115/58 114/47  96/47  Pulse: 78   83  Temp: 98.9 F (37.2 C) 98.4 F (36.9 C)  98.5 F (36.9 C)  TempSrc: Oral Oral  Oral  Resp: 24   20  Height:      Weight:   187 lb 9.8 oz (85.1 kg)   SpO2: 97%   98%    Intake/Output Summary (Last 24 hours) at 03/03/13 0747 Last data filed at 03/03/13 0600  Gross per 24 hour  Intake 1394.9 ml  Output    950 ml  Net  444.9 ml    SUBJECTIVE Feels well this am. Only complains of SOB when she is moving around. No chest pain.  LABS: Basic Metabolic Panel:  Recent Labs  16/10/96 0416 03/03/13 0500  NA 138 138  K 4.0 4.3  CL 102 103  CO2 26 26  GLUCOSE 107* 107*  BUN 17 13  CREATININE 1.10 0.98  CALCIUM 8.6 8.9   Liver Function Tests:  Recent Labs  02/28/13 1100  AST 23  ALT 32  ALKPHOS 69  BILITOT 0.8  PROT 7.2  ALBUMIN 4.0   CBC:  Recent Labs  02/28/13 1100  03/02/13 0500 03/03/13 0500  WBC 10.9*  < > 7.5 6.1  NEUTROABS 8.4*  --   --   --   HGB 11.0*  < > 10.0* 9.3*  HCT 33.5*  < > 30.4* 27.8*  MCV 88.9  < > 88.6 88.3  PLT 197  < > 183 169  < > = values in this interval not displayed. Cardiac Enzymes:  Recent Labs  02/28/13 1100  TROPONINI <0.30   Thyroid Function Tests:  Recent Labs  02/28/13 1100  TSH 0.488   Anemia Panel: No results found for this basename: VITAMINB12, FOLATE, FERRITIN, TIBC, IRON, RETICCTPCT,  in the last 72 hours  Radiology/Studies:  Dg Chest 2 View  02/28/2013   CLINICAL DATA:  Shortness of breath, atrial fibrillation and rapid ventricular rate.  EXAM: CHEST - 2 VIEW  COMPARISON:  None  FINDINGS: The heart size and mediastinal contours are within normal limits. There is no evidence of pulmonary edema, consolidation, pneumothorax, nodule or pleural fluid. Degenerative changes are present in the spine.  IMPRESSION: No active disease.   Electronically  Signed   By: Irish Lack   On: 02/28/2013 16:27   Ecg: 03/01/13 NSR with PACs. Prolonged QT.  Echo: Study Conclusions  - Left ventricle: The cavity size was normal. Wall thickness was increased in a pattern of moderate LVH. Systolic function was normal. The estimated ejection fraction was in the range of 55% to 60%. Wall motion was normal; there were no regional wall motion abnormalities. Left ventricular diastolic function parameters were normal. - Aortic valve: There was moderate to severe stenosis. Mild regurgitation. Valve area: 0.51cm^2(VTI). Valve area: 0.51cm^2 (Vmax). - Mitral valve: Moderate regurgitation. Valve area by continuity equation (using LVOT flow): 1.14cm^2. - Left atrium: The atrium was mildly dilated.  EAV:WUJWJ Conclusions  - Left ventricle: The cavity size was normal. Wall thickness was increased in a pattern of mild LVH. The estimated ejection fraction was 55%. Wall motion was normal; there were no regional wall motion abnormalities. - Aortic valve: Trileaflet; moderately calcified leaflets. There was severe stenosis. Mean gradient: 43mm Hg (S). Peak gradient: 74mm Hg (S). Valve area: 0.73cm^2(VTI). Valve area: 0.79cm^2 (Vmax). - Aorta: Normal  caliber. - Mitral valve: The mitral valve was thickened and calcified with a rheumatic appearance. The findings are consistent with trivial stenosis. Mild to moderate regurgitation. Mean gradient: 5mm Hg (D). Valve area by pressure half-time: 2.53cm^2. Valve area by continuity equation (using LVOT flow): 2.48cm^2. - Left atrium: The atrium was moderately dilated. No evidence of thrombus in the atrial cavity or appendage. - Right ventricle: The cavity size was normal. Systolic function was normal. - Right atrium: No evidence of thrombus in the atrial cavity or appendage. - Atrial septum: No defect or patent foramen ovale was identified. Echo contrast study showed no right-to-left atrial level shunt, at  baseline or with provocation.  Cardiac Catheterization Procedure Note  Name: Nicole [name]  MRN: [id number]  DOB: [birth year]  Procedure: Right Heart Cath, Selective Coronary Angiography  Indication: Pre-MAZE/AVR surgery.  Procedural Details: The right groin was prepped, draped, and anesthetized with 1% lidocaine. Using the modified Seldinger technique a 5 French sheath was placed in the right femoral artery and a 7 French sheath was placed in the right femoral vein. A Swan-Ganz catheter was used for the right heart catheterization. Standard protocol was followed for recording of right heart pressures and sampling of oxygen saturations. Fick cardiac output was calculated. Standard Judkins catheters were used for selective coronary angiography. There were no immediate procedural complications. The patient was transferred to the post catheterization recovery area for further monitoring.  Procedural Findings:  Hemodynamics (mmHg)  RA mean 4  RV 29/2  PA 29/8  PCWP mean 12  AO 98/54  Oxygen saturations:  PA 55%  AO 91%  Cardiac Output (Fick) 5.11  Cardiac Index (Fick) 2.72  Coronary angiography:  Coronary dominance: right  Left mainstem: Anomalous origin off the right cusp. No plaque or stenosis. No extrinsic compression noted. Bifurcates into LAD and LCx at what appears to be the typical location.  Left anterior descending (LAD): No plaque or stenosis, follows normal course.  Left circumflex (LCx): No plaque or stenosis, follows normal course.  Right coronary artery (RCA): Normal origin, no plaque or stenosis.  Left ventriculography: Not done, severe AS.  Final Conclusions: No angiographic CAD. 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 At times, this is a malignant anomaly due to compression between PA and aorta or intramural course. However, this patient has had no problems with this over 77 years so my suspicion is that it is not a high risk for her. Would need coronary CT or  visualization at time of surgery to see the exact track the vessel takes (intramural or between PA and aorta). Normal filling pressures, does not need further diuresis. Given recent DCCV, I will restart heparin gtt in am tomorrow (off Xarelto since Wednesday pm) and continue until surgery.  Recommendations: Surgical evaluation from Dr. Laneta Simmers for MAZE/AVR.  [name]  03/02/2013, 11:05 AM   PHYSICAL EXAM General: Well developed, well nourished, in no acute distress. Head: Normal Neck: Negative for carotid bruits. JVD not elevated. Lungs: Clear bilaterally to auscultation without wheezes, rales, or rhonchi. Breathing is unlabored. Heart: RRR gr 3/6 systolic murmur RUSB, S2 reduced. Abdomen: Soft, non-tender, non-distended with normoactive bowel sounds.  Msk:  Strength and tone appears normal for age. Extremities: No clubbing, cyanosis or edema.  Distal pedal pulses are 2+ and equal bilaterally. No groin hematoma. Neuro: Alert and oriented X 3. Moves all extremities spontaneously. Psych:  Responds to questions appropriately with a normal affect.  ASSESSMENT AND PLAN: 1. Severe aortic stenosis. For AVR on Monday.  2. Atrial fibrillation. S/p multiple DCCVs. Now in sinus rhythm on amiodarone. For MAZE on Monday. On IV heparin for now. 3. 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 4. Acute on chronic diastolic CHF- well compensated. Filling pressures normal on cath. 5. COPD 6. Hyperlipidemia.  Principal Problem:   Acute on chronic diastolic HF (heart failure) Active Problems:   Atrial fibrillation   Aortic stenosis    Signed, Ondrea Dow [place] MD,FACC 03/03/2013 7:47 AM

## 2013-03-03 NOTE — Progress Notes (Addendum)
DOE while Pt amb to chair from BR. RR = 30 hoarseness = oxygen sats = 96% on RA .Hr = 89 .

## 2013-03-03 NOTE — Progress Notes (Signed)
ANTICOAGULATION CONSULT NOTE  Pharmacy Consult for Heparin Indication: atrial fibrillation  No Known Allergies  Patient Measurements: Height: 5\' 3"  (160 cm) Weight: 187 lb 9.8 oz (85.1 kg) IBW/kg (Calculated) : 52.4 Heparin Dosing Weight: 80kg  Vital Signs: Temp: 98.5 F (36.9 C) (10/04 1954) Temp src: Oral (10/04 1954) BP: 118/53 mmHg (10/04 1954) Pulse Rate: 78 (10/04 1954)  Labs:  Recent Labs  03/01/13 0620 03/02/13 0416 03/02/13 0500 03/03/13 0500 03/03/13 1415 03/03/13 2240  HGB 9.3*  --  10.0* 9.3*  --   --   HCT 28.0*  --  30.4* 27.8*  --   --   PLT 153  --  183 169  --   --   APTT  --  34  --  43*  --   --   LABPROT  --  16.0*  --   --   --   --   INR  --  1.31  --   --   --   --   HEPARINUNFRC  --   --   --  0.26* 0.38 0.39  CREATININE 1.03 1.10  --  0.98  --   --     Estimated Creatinine Clearance: 51.3 ml/min (by C-G formula based on Cr of 0.98).  Assessment: 77 yo female with Afib for heparin Goal of Therapy:  Heparin level 0.3-0.7 units/ml Monitor platelets by anticoagulation protocol: Yes   Plan:  Continue Heparin at current rate    Essex Perry, Gary Fleet 03/03/2013,11:16 PM

## 2013-03-04 LAB — CBC
HCT: 28.5 % — ABNORMAL LOW (ref 36.0–46.0)
Hemoglobin: 9.4 g/dL — ABNORMAL LOW (ref 12.0–15.0)
MCH: 29.1 pg (ref 26.0–34.0)
MCHC: 33 g/dL (ref 30.0–36.0)
MCV: 88.2 fL (ref 78.0–100.0)
Platelets: 157 10*3/uL (ref 150–400)
RBC: 3.23 MIL/uL — ABNORMAL LOW (ref 3.87–5.11)

## 2013-03-04 LAB — HEMOGLOBIN A1C
Hgb A1c MFr Bld: 5.4 % (ref <5.7)
Mean Plasma Glucose: 108 mg/dL (ref <117)

## 2013-03-04 LAB — URINALYSIS, ROUTINE W REFLEX MICROSCOPIC
Bilirubin Urine: NEGATIVE
Hgb urine dipstick: NEGATIVE
Nitrite: NEGATIVE
Protein, ur: NEGATIVE mg/dL
Urobilinogen, UA: 1 mg/dL (ref 0.0–1.0)

## 2013-03-04 LAB — ABO/RH: ABO/RH(D): A POS

## 2013-03-04 MED ORDER — VANCOMYCIN HCL 10 G IV SOLR
1250.0000 mg | INTRAVENOUS | Status: AC
  Administered 2013-03-05: 1250 mg via INTRAVENOUS
  Filled 2013-03-04: qty 1250

## 2013-03-04 MED ORDER — TEMAZEPAM 15 MG PO CAPS
15.0000 mg | ORAL_CAPSULE | Freq: Once | ORAL | Status: AC | PRN
  Administered 2013-03-05: 15 mg via ORAL
  Filled 2013-03-04: qty 1

## 2013-03-04 MED ORDER — DEXTROSE 5 % IV SOLN
750.0000 mg | INTRAVENOUS | Status: DC
  Filled 2013-03-04: qty 750

## 2013-03-04 MED ORDER — SODIUM CHLORIDE 0.9 % IV SOLN
INTRAVENOUS | Status: DC
  Filled 2013-03-04: qty 30

## 2013-03-04 MED ORDER — EPINEPHRINE HCL 1 MG/ML IJ SOLN
0.5000 ug/min | INTRAVENOUS | Status: DC
  Filled 2013-03-04: qty 4

## 2013-03-04 MED ORDER — METOPROLOL TARTRATE 12.5 MG HALF TABLET
12.5000 mg | ORAL_TABLET | Freq: Once | ORAL | Status: AC
  Administered 2013-03-05: 12.5 mg via ORAL
  Filled 2013-03-04: qty 1

## 2013-03-04 MED ORDER — SODIUM CHLORIDE 0.9 % IV SOLN
INTRAVENOUS | Status: AC
  Administered 2013-03-05: 1 [IU]/h via INTRAVENOUS
  Filled 2013-03-04: qty 1

## 2013-03-04 MED ORDER — MAGNESIUM SULFATE 50 % IJ SOLN
40.0000 meq | INTRAMUSCULAR | Status: DC
  Filled 2013-03-04: qty 10

## 2013-03-04 MED ORDER — NITROGLYCERIN IN D5W 200-5 MCG/ML-% IV SOLN
2.0000 ug/min | INTRAVENOUS | Status: DC
  Filled 2013-03-04: qty 250

## 2013-03-04 MED ORDER — DEXTROSE 5 % IV SOLN
1.5000 g | INTRAVENOUS | Status: AC
  Administered 2013-03-05: .75 g via INTRAVENOUS
  Administered 2013-03-05: 1.5 g via INTRAVENOUS
  Filled 2013-03-04: qty 1.5

## 2013-03-04 MED ORDER — PHENYLEPHRINE HCL 10 MG/ML IJ SOLN
30.0000 ug/min | INTRAVENOUS | Status: AC
  Administered 2013-03-05: 25 ug/min via INTRAVENOUS
  Administered 2013-03-05: 20 ug/min via INTRAVENOUS
  Filled 2013-03-04: qty 2

## 2013-03-04 MED ORDER — PLASMA-LYTE 148 IV SOLN
INTRAVENOUS | Status: AC
  Administered 2013-03-05: 09:00:00
  Filled 2013-03-04: qty 2.5

## 2013-03-04 MED ORDER — DOPAMINE-DEXTROSE 3.2-5 MG/ML-% IV SOLN
2.0000 ug/kg/min | INTRAVENOUS | Status: DC
  Filled 2013-03-04: qty 250

## 2013-03-04 MED ORDER — POTASSIUM CHLORIDE 2 MEQ/ML IV SOLN
80.0000 meq | INTRAVENOUS | Status: DC
  Filled 2013-03-04: qty 40

## 2013-03-04 MED ORDER — CHLORHEXIDINE GLUCONATE 4 % EX LIQD
60.0000 mL | Freq: Once | CUTANEOUS | Status: AC
  Administered 2013-03-05: 4 via TOPICAL
  Filled 2013-03-04: qty 60

## 2013-03-04 MED ORDER — BISACODYL 5 MG PO TBEC
5.0000 mg | DELAYED_RELEASE_TABLET | Freq: Once | ORAL | Status: AC
  Administered 2013-03-04: 5 mg via ORAL
  Filled 2013-03-04: qty 1

## 2013-03-04 MED ORDER — CHLORHEXIDINE GLUCONATE 4 % EX LIQD: 60.0000 mL | Freq: Once | CUTANEOUS | Status: AC

## 2013-03-04 MED ORDER — DEXMEDETOMIDINE HCL IN NACL 400 MCG/100ML IV SOLN
0.1000 ug/kg/h | INTRAVENOUS | Status: AC
  Administered 2013-03-05: 0.3 ug/kg/h via INTRAVENOUS
  Filled 2013-03-04: qty 100

## 2013-03-04 MED ORDER — DIAZEPAM 5 MG PO TABS
5.0000 mg | ORAL_TABLET | Freq: Once | ORAL | Status: AC
  Administered 2013-03-05: 5 mg via ORAL
  Filled 2013-03-04: qty 1

## 2013-03-04 MED ORDER — SODIUM CHLORIDE 0.9 % IV SOLN
INTRAVENOUS | Status: AC
  Administered 2013-03-05: 69 mL/h via INTRAVENOUS
  Filled 2013-03-04: qty 40

## 2013-03-04 NOTE — Progress Notes (Signed)
ANTICOAGULATION CONSULT NOTE - Follow Up Consult  Pharmacy Consult for Heparin Indication: atrial fibrillation  No Known Allergies  Patient Measurements: Height: 5\' 3"  (160 cm) Weight: 192 lb 10.9 oz (87.4 kg) IBW/kg (Calculated) : 52.4 Heparin Dosing Weight: 80kg  Vital Signs: Temp: 98.6 F (37 C) (10/05 0352) Temp src: Oral (10/05 0352) BP: 122/55 mmHg (10/05 0355) Pulse Rate: 71 (10/05 0355)  Labs:  Recent Labs  03/02/13 0416  03/02/13 0500  03/03/13 0500 03/03/13 1415 03/03/13 2240 03/04/13 0612  HGB  --   < > 10.0*  --  9.3*  --   --  9.4*  HCT  --   --  30.4*  --  27.8*  --   --  28.5*  PLT  --   --  183  --  169  --   --  157  APTT 34  --   --   --  43*  --   --   --   LABPROT 16.0*  --   --   --   --   --   --   --   INR 1.31  --   --   --   --   --   --   --   HEPARINUNFRC  --   --   --   < > 0.26* 0.38 0.39 0.40  CREATININE 1.10  --   --   --  0.98  --   --   --   < > = values in this interval not displayed.  Estimated Creatinine Clearance: 52 ml/min (by C-G formula based on Cr of 0.98).   Medications:  Heparin @ 1150 units/hr  Assessment: 77yof with history of afib on xarelto pta. She was recently cardioverted on 9/16, TEE on 10/2 showed severe AS and moderate MR. She is now s/p cath with plans for AVR and MAZE procedure on Monday 10/6. Heparin bridge started 10/3 PM. Heparin level is therapeutic, xarelto has cleared. Hgb/Hct low but stable, platelets ok. No bleeding reported.  Goal of Therapy:  Heparin level 0.3-0.7 units/ml Monitor platelets by anticoagulation protocol: Yes   Plan:  1) Continue heparin at 1150 units/hr 2) Follow up after surgery tomorrow  Fredrik Rigger 03/04/2013,7:26 AM

## 2013-03-04 NOTE — Progress Notes (Signed)
TELEMETRY: Reviewed telemetry pt in NSR: Filed Vitals:   03/04/13 0334 03/04/13 0352 03/04/13 0355 03/04/13 0356  BP:   122/55   Pulse: 72  71   Temp:  98.6 F (37 C)    TempSrc:  Oral    Resp: 16  19   Height:      Weight:    192 lb 10.9 oz (87.4 kg)  SpO2: 93%  95%     Intake/Output Summary (Last 24 hours) at 03/04/13 0801 Last data filed at 03/04/13 0400  Gross per 24 hour  Intake   1270 ml  Output    977 ml  Net    293 ml    SUBJECTIVE Feels well this am. Complained of SOB last night relieved with oxygen. No chest pain.  LABS: Basic Metabolic Panel:  Recent Labs  16/10/96 0416 03/03/13 0500  NA 138 138  K 4.0 4.3  CL 102 103  CO2 26 26  GLUCOSE 107* 107*  BUN 17 13  CREATININE 1.10 0.98  CALCIUM 8.6 8.9   CBC:  Recent Labs  03/03/13 0500 03/04/13 0612  WBC 6.1 7.0  HGB 9.3* 9.4*  HCT 27.8* 28.5*  MCV 88.3 88.2  PLT 169 157   Radiology/Studies:  Dg Chest 2 View  02/28/2013   CLINICAL DATA:  Shortness of breath, atrial fibrillation and rapid ventricular rate.  EXAM: CHEST - 2 VIEW  COMPARISON:  None  FINDINGS: The heart size and mediastinal contours are within normal limits. There is no evidence of pulmonary edema, consolidation, pneumothorax, nodule or pleural fluid. Degenerative changes are present in the spine.  IMPRESSION: No active disease.   Electronically Signed   By: Nicole [name]   On: 02/28/2013 16:27   Ecg: 03/01/13 NSR with PACs. Prolonged QT.  Echo: Study Conclusions  - Left ventricle: The cavity size was normal. Wall thickness was increased in a pattern of moderate LVH. Systolic function was normal. The estimated ejection fraction was in the range of 55% to 60%. Wall motion was normal; there were no regional wall motion abnormalities. Left ventricular diastolic function parameters were normal. - Aortic valve: There was moderate to severe stenosis. Mild regurgitation. Valve area: 0.51cm^2(VTI). Valve area: 0.51cm^2  (Vmax). - Mitral valve: Moderate regurgitation. Valve area by continuity equation (using LVOT flow): 1.14cm^2. - Left atrium: The atrium was mildly dilated.  EAV:WUJWJ Conclusions  - Left ventricle: The cavity size was normal. Wall thickness was increased in a pattern of mild LVH. The estimated ejection fraction was 55%. Wall motion was normal; there were no regional wall motion abnormalities. - Aortic valve: Trileaflet; moderately calcified leaflets. There was severe stenosis. Mean gradient: 43mm Hg (S). Peak gradient: 74mm Hg (S). Valve area: 0.73cm^2(VTI). Valve area: 0.79cm^2 (Vmax). - Aorta: Normal caliber. - Mitral valve: The mitral valve was thickened and calcified with a rheumatic appearance. The findings are consistent with trivial stenosis. Mild to moderate regurgitation. Mean gradient: 5mm Hg (D). Valve area by pressure half-time: 2.53cm^2. Valve area by continuity equation (using LVOT flow): 2.48cm^2. - Left atrium: The atrium was moderately dilated. No evidence of thrombus in the atrial cavity or appendage. - Right ventricle: The cavity size was normal. Systolic function was normal. - Right atrium: No evidence of thrombus in the atrial cavity or appendage. - Atrial septum: No defect or patent foramen ovale was identified. Echo contrast study showed no right-to-left atrial level shunt, at baseline or with provocation.  Cardiac Catheterization Procedure Note  Name: Nicole [name]  MRN: [id number]  DOB: 1935-08-02  Procedure: Right Heart Cath, Selective Coronary Angiography  Indication: Pre-MAZE/AVR surgery.  Procedural Details: The right groin was prepped, draped, and anesthetized with 1% lidocaine. Using the modified Seldinger technique a 5 French sheath was placed in the right femoral artery and a 7 French sheath was placed in the right femoral vein. A Swan-Ganz catheter was used for the right heart catheterization. Standard protocol was followed for recording of right  heart pressures and sampling of oxygen saturations. Fick cardiac output was calculated. Standard Judkins catheters were used for selective coronary angiography. There were no immediate procedural complications. The patient was transferred to the post catheterization recovery area for further monitoring.  Procedural Findings:  Hemodynamics (mmHg)  RA mean 4  RV 29/2  PA 29/8  PCWP mean 12  AO 98/54  Oxygen saturations:  PA 55%  AO 91%  Cardiac Output (Fick) 5.11  Cardiac Index (Fick) 2.72  Coronary angiography:  Coronary dominance: right  Left mainstem: Anomalous origin off the right cusp. No plaque or stenosis. No extrinsic compression noted. Bifurcates into LAD and LCx at what appears to be the typical location.  Left anterior descending (LAD): No plaque or stenosis, follows normal course.  Left circumflex (LCx): No plaque or stenosis, follows normal course.  Right coronary artery (RCA): Normal origin, no plaque or stenosis.  Left ventriculography: Not done, severe AS.  Final Conclusions: No angiographic CAD. 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 At times, this is a malignant anomaly due to compression between PA and aorta or intramural course. However, this patient has had no problems with this over 77 years so my suspicion is that it is not a high risk for her. Would need coronary CT or visualization at time of surgery to see the exact track the vessel takes (intramural or between PA and aorta). Normal filling pressures, does not need further diuresis. Given recent DCCV, I will restart heparin gtt in am tomorrow (off Xarelto since Wednesday pm) and continue until surgery.  Recommendations: Surgical evaluation from Dr. Laneta [name] for MAZE/AVR.  Nicole [name]  03/02/2013, 11:05 AM   PHYSICAL EXAM General: Well developed, well nourished, in no acute distress. Head: Normal Neck: Negative for carotid bruits. JVD not elevated. Lungs: Clear bilaterally to auscultation without  wheezes, rales, or rhonchi. Breathing is unlabored. Heart: RRR gr 3/6 systolic murmur RUSB, S2 reduced. Abdomen: Soft, non-tender, non-distended with normoactive bowel sounds.  Msk:  Strength and tone appears normal for age. Extremities: No clubbing, cyanosis or edema.  Distal pedal pulses are 2+ and equal bilaterally. No groin hematoma. Neuro: Alert and oriented X 3. Moves all extremities spontaneously. Psych:  Responds to questions appropriately with a normal affect.  ASSESSMENT AND PLAN: 1. Severe aortic stenosis. For AVR on Monday. 2. Atrial fibrillation. S/p multiple DCCVs. Now in sinus rhythm on amiodarone. For MAZE on Monday. On IV heparin for now. 3. 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 4. Acute on chronic diastolic CHF- well compensated. Filling pressures normal on cath. 5. COPD 6. Hyperlipidemia.  Principal Problem:   Acute on chronic diastolic HF (heart failure) Active Problems:   Atrial fibrillation   Aortic stenosis    Signed, Peter [place] MD,FACC 03/04/2013 8:01 AM

## 2013-03-05 ENCOUNTER — Encounter (HOSPITAL_COMMUNITY): Payer: Self-pay | Admitting: Certified Registered"

## 2013-03-05 ENCOUNTER — Inpatient Hospital Stay (HOSPITAL_COMMUNITY): Payer: Medicare Other

## 2013-03-05 ENCOUNTER — Inpatient Hospital Stay (HOSPITAL_COMMUNITY): Payer: Medicare Other | Admitting: Certified Registered"

## 2013-03-05 ENCOUNTER — Encounter (HOSPITAL_COMMUNITY)
Admission: AD | Disposition: A | Discharge status: Skilled Nursing Facility | Payer: Medicare Other | Source: Ambulatory Visit | Attending: Surgery

## 2013-03-05 DIAGNOSIS — I059 Rheumatic mitral valve disease, unspecified: Secondary | ICD-10-CM

## 2013-03-05 DIAGNOSIS — I359 Nonrheumatic aortic valve disorder, unspecified: Secondary | ICD-10-CM

## 2013-03-05 DIAGNOSIS — I4891 Unspecified atrial fibrillation: Secondary | ICD-10-CM

## 2013-03-05 HISTORY — PX: AORTIC VALVE REPLACEMENT: SHX41

## 2013-03-05 HISTORY — PX: INTRAOPERATIVE TRANSESOPHAGEAL ECHOCARDIOGRAM: SHX5062

## 2013-03-05 HISTORY — PX: MITRAL VALVE REPAIR: SHX2039

## 2013-03-05 HISTORY — PX: MAZE: SHX5063

## 2013-03-05 LAB — POCT I-STAT 4, (NA,K, GLUC, HGB,HCT)
Glucose, Bld: 122 mg/dL — ABNORMAL HIGH (ref 70–99)
Glucose, Bld: 124 mg/dL — ABNORMAL HIGH (ref 70–99)
Glucose, Bld: 127 mg/dL — ABNORMAL HIGH (ref 70–99)
Glucose, Bld: 118 mg/dL — ABNORMAL HIGH (ref 70–99)
HCT: 25 % — ABNORMAL LOW (ref 36.0–46.0)
HCT: 23 % — ABNORMAL LOW (ref 36.0–46.0)
HCT: 22 % — ABNORMAL LOW (ref 36.0–46.0)
HCT: 24 % — ABNORMAL LOW (ref 36.0–46.0)
HCT: 24 % — ABNORMAL LOW (ref 36.0–46.0)
HCT: 23 % — ABNORMAL LOW (ref 36.0–46.0)
HCT: 30 % — ABNORMAL LOW (ref 36.0–46.0)
Hemoglobin: 7.5 g/dL — ABNORMAL LOW (ref 12.0–15.0)
Hemoglobin: 8.2 g/dL — ABNORMAL LOW (ref 12.0–15.0)
Hemoglobin: 7.5 g/dL — ABNORMAL LOW (ref 12.0–15.0)
Hemoglobin: 7.8 g/dL — ABNORMAL LOW (ref 12.0–15.0)
Hemoglobin: 10.2 g/dL — ABNORMAL LOW (ref 12.0–15.0)
Potassium: 5.6 mEq/L — ABNORMAL HIGH (ref 3.5–5.1)
Potassium: 5.2 mEq/L — ABNORMAL HIGH (ref 3.5–5.1)
Potassium: 5.1 mEq/L (ref 3.5–5.1)
Potassium: 6.1 mEq/L — ABNORMAL HIGH (ref 3.5–5.1)
Sodium: 139 mEq/L (ref 135–145)
Sodium: 136 mEq/L (ref 135–145)
Sodium: 136 mEq/L (ref 135–145)
Sodium: 138 mEq/L (ref 135–145)
Sodium: 138 mEq/L (ref 135–145)
Sodium: 139 mEq/L (ref 135–145)
Sodium: 141 mEq/L (ref 135–145)

## 2013-03-05 LAB — CBC
HCT: 27.3 % — ABNORMAL LOW (ref 36.0–46.0)
HCT: 26.7 % — ABNORMAL LOW (ref 36.0–46.0)
Hemoglobin: 9 g/dL — ABNORMAL LOW (ref 12.0–15.0)
Hemoglobin: 8.7 g/dL — ABNORMAL LOW (ref 12.0–15.0)
Hemoglobin: 9.9 g/dL — ABNORMAL LOW (ref 12.0–15.0)
Hemoglobin: 9.8 g/dL — ABNORMAL LOW (ref 12.0–15.0)
MCH: 29.3 pg (ref 26.0–34.0)
MCH: 29 pg (ref 26.0–34.0)
MCH: 30.1 pg (ref 26.0–34.0)
MCHC: 33 g/dL (ref 30.0–36.0)
MCHC: 34 g/dL (ref 30.0–36.0)
MCV: 88.9 fL (ref 78.0–100.0)
MCV: 89 fL (ref 78.0–100.0)
MCV: 88.4 fL (ref 78.0–100.0)
MCV: 87.8 fL (ref 78.0–100.0)
Platelets: 133 10*3/uL — ABNORMAL LOW (ref 150–400)
Platelets: 128 10*3/uL — ABNORMAL LOW (ref 150–400)
RBC: 3.07 MIL/uL — ABNORMAL LOW (ref 3.87–5.11)
RBC: 3 MIL/uL — ABNORMAL LOW (ref 3.87–5.11)
RBC: 3.29 MIL/uL — ABNORMAL LOW (ref 3.87–5.11)
RBC: 3.29 MIL/uL — ABNORMAL LOW (ref 3.87–5.11)
RDW: 14.8 % (ref 11.5–15.5)
RDW: 14.7 % (ref 11.5–15.5)
WBC: 8.2 10*3/uL (ref 4.0–10.5)
WBC: 17.2 10*3/uL — ABNORMAL HIGH (ref 4.0–10.5)

## 2013-03-05 LAB — POCT I-STAT 3, ART BLOOD GAS (G3+)
Acid-base deficit: 3 mmol/L — ABNORMAL HIGH (ref 0.0–2.0)
Acid-base deficit: 4 mmol/L — ABNORMAL HIGH (ref 0.0–2.0)
Acid-base deficit: 3 mmol/L — ABNORMAL HIGH (ref 0.0–2.0)
Bicarbonate: 23.5 mEq/L (ref 20.0–24.0)
Bicarbonate: 22.9 mEq/L (ref 20.0–24.0)
O2 Saturation: 100 %
Patient temperature: 36.7
TCO2: 25 mmol/L (ref 0–100)
pCO2 arterial: 46.8 mmHg — ABNORMAL HIGH (ref 35.0–45.0)
pCO2 arterial: 46.4 mmHg — ABNORMAL HIGH (ref 35.0–45.0)
pCO2 arterial: 46.8 mmHg — ABNORMAL HIGH (ref 35.0–45.0)
pCO2 arterial: 48 mmHg — ABNORMAL HIGH (ref 35.0–45.0)
pH, Arterial: 7.324 — ABNORMAL LOW (ref 7.350–7.450)
pH, Arterial: 7.295 — ABNORMAL LOW (ref 7.350–7.450)
pH, Arterial: 7.33 — ABNORMAL LOW (ref 7.350–7.450)
pH, Arterial: 7.302 — ABNORMAL LOW (ref 7.350–7.450)
pO2, Arterial: 371 mmHg — ABNORMAL HIGH (ref 80.0–100.0)
pO2, Arterial: 247 mmHg — ABNORMAL HIGH (ref 80.0–100.0)
pO2, Arterial: 109 mmHg — ABNORMAL HIGH (ref 80.0–100.0)
pO2, Arterial: 71 mmHg — ABNORMAL LOW (ref 80.0–100.0)

## 2013-03-05 LAB — POCT I-STAT, CHEM 8
BUN: 11 mg/dL (ref 6–23)
Chloride: 110 mEq/L (ref 96–112)
Glucose, Bld: 124 mg/dL — ABNORMAL HIGH (ref 70–99)
Potassium: 4.8 mEq/L (ref 3.5–5.1)
Sodium: 138 mEq/L (ref 135–145)

## 2013-03-05 LAB — BASIC METABOLIC PANEL
CO2: 25 mEq/L (ref 19–32)
Calcium: 8.6 mg/dL (ref 8.4–10.5)
Creatinine, Ser: 0.98 mg/dL (ref 0.50–1.10)
GFR calc non Af Amer: 55 mL/min — ABNORMAL LOW (ref >90)
Glucose, Bld: 140 mg/dL — ABNORMAL HIGH (ref 70–99)

## 2013-03-05 LAB — PROTIME-INR: Prothrombin Time: 18.7 seconds — ABNORMAL HIGH (ref 11.6–15.2)

## 2013-03-05 LAB — GLUCOSE, CAPILLARY
Glucose-Capillary: 103 mg/dL — ABNORMAL HIGH (ref 70–99)
Glucose-Capillary: 114 mg/dL — ABNORMAL HIGH (ref 70–99)
Glucose-Capillary: 120 mg/dL — ABNORMAL HIGH (ref 70–99)
Glucose-Capillary: 125 mg/dL — ABNORMAL HIGH (ref 70–99)
Glucose-Capillary: 117 mg/dL — ABNORMAL HIGH (ref 70–99)
Glucose-Capillary: 121 mg/dL — ABNORMAL HIGH (ref 70–99)

## 2013-03-05 LAB — MAGNESIUM: Magnesium: 3.4 mg/dL — ABNORMAL HIGH (ref 1.5–2.5)

## 2013-03-05 LAB — HEMOGLOBIN AND HEMATOCRIT, BLOOD
HCT: 23.9 % — ABNORMAL LOW (ref 36.0–46.0)
Hemoglobin: 8.2 g/dL — ABNORMAL LOW (ref 12.0–15.0)

## 2013-03-05 LAB — CREATININE, SERUM
GFR calc Af Amer: 79 mL/min — ABNORMAL LOW (ref >90)
GFR calc non Af Amer: 68 mL/min — ABNORMAL LOW (ref >90)

## 2013-03-05 LAB — PLATELET COUNT: Platelets: 71 10*3/uL — ABNORMAL LOW (ref 150–400)

## 2013-03-05 LAB — HEPARIN LEVEL (UNFRACTIONATED): Heparin Unfractionated: 0.25 IU/mL — ABNORMAL LOW (ref 0.30–0.70)

## 2013-03-05 SURGERY — REPLACEMENT, AORTIC VALVE, OPEN
Anesthesia: General | Site: Mouth | Wound class: Clean

## 2013-03-05 MED ORDER — SODIUM CHLORIDE 0.9 % IV SOLN
INTRAVENOUS | Status: DC
  Filled 2013-03-05: qty 1

## 2013-03-05 MED ORDER — ALBUMIN HUMAN 5 % IV SOLN
INTRAVENOUS | Status: DC | PRN
  Administered 2013-03-05: 13:00:00 via INTRAVENOUS

## 2013-03-05 MED ORDER — SODIUM CHLORIDE 0.9 % IV SOLN: INTRAVENOUS | Status: DC

## 2013-03-05 MED ORDER — PHENYLEPHRINE HCL 10 MG/ML IJ SOLN
0.0000 ug/min | INTRAVENOUS | Status: DC
  Filled 2013-03-05 (×2): qty 2

## 2013-03-05 MED ORDER — SODIUM CHLORIDE 0.9 % IJ SOLN: 3.0000 mL | INTRAMUSCULAR | Status: DC | PRN

## 2013-03-05 MED ORDER — LACTATED RINGERS IV SOLN
INTRAVENOUS | Status: DC
  Administered 2013-03-05: 15:00:00 via INTRAVENOUS

## 2013-03-05 MED ORDER — VANCOMYCIN HCL IN DEXTROSE 1-5 GM/200ML-% IV SOLN
1000.0000 mg | Freq: Once | INTRAVENOUS | Status: AC
  Administered 2013-03-05: 1000 mg via INTRAVENOUS
  Filled 2013-03-05: qty 200

## 2013-03-05 MED ORDER — FENTANYL CITRATE 0.05 MG/ML IJ SOLN
INTRAMUSCULAR | Status: AC
  Filled 2013-03-05: qty 2

## 2013-03-05 MED ORDER — BISACODYL 10 MG RE SUPP: 10.0000 mg | Freq: Every day | RECTAL | Status: DC

## 2013-03-05 MED ORDER — METOPROLOL TARTRATE 25 MG/10 ML ORAL SUSPENSION
12.5000 mg | Freq: Two times a day (BID) | ORAL | Status: DC
  Administered 2013-03-05: 12.5 mg
  Filled 2013-03-05 (×3): qty 5

## 2013-03-05 MED ORDER — HEPARIN SODIUM (PORCINE) 1000 UNIT/ML IJ SOLN
INTRAMUSCULAR | Status: DC | PRN
  Administered 2013-03-05: 28000 [IU] via INTRAVENOUS

## 2013-03-05 MED ORDER — MIDAZOLAM HCL 2 MG/2ML IJ SOLN
INTRAMUSCULAR | Status: AC
  Filled 2013-03-05: qty 2

## 2013-03-05 MED ORDER — BISACODYL 5 MG PO TBEC
10.0000 mg | DELAYED_RELEASE_TABLET | Freq: Every day | ORAL | Status: DC
  Administered 2013-03-06 – 2013-03-08 (×3): 10 mg via ORAL
  Filled 2013-03-05 (×3): qty 2

## 2013-03-05 MED ORDER — ARTIFICIAL TEARS OP OINT
TOPICAL_OINTMENT | OPHTHALMIC | Status: DC | PRN
  Administered 2013-03-05: 1 via OPHTHALMIC

## 2013-03-05 MED ORDER — METOPROLOL TARTRATE 1 MG/ML IV SOLN: 2.5000 mg | INTRAVENOUS | Status: DC | PRN

## 2013-03-05 MED ORDER — LIDOCAINE HCL (CARDIAC) 20 MG/ML IV SOLN
INTRAVENOUS | Status: DC | PRN
  Administered 2013-03-05: 60 mg via INTRAVENOUS

## 2013-03-05 MED ORDER — DEXMEDETOMIDINE HCL IN NACL 200 MCG/50ML IV SOLN
0.4000 ug/kg/h | INTRAVENOUS | Status: DC
  Filled 2013-03-05: qty 50

## 2013-03-05 MED ORDER — ACETAMINOPHEN 160 MG/5ML PO SOLN: 650.0000 mg | Freq: Once | ORAL | Status: AC

## 2013-03-05 MED ORDER — POTASSIUM CHLORIDE 10 MEQ/50ML IV SOLN: 10.0000 meq | INTRAVENOUS | Status: DC

## 2013-03-05 MED ORDER — SODIUM CHLORIDE 0.9 % IV SOLN: 250.0000 mL | INTRAVENOUS | Status: DC

## 2013-03-05 MED ORDER — SODIUM CHLORIDE 0.45 % IV SOLN: INTRAVENOUS | Status: DC

## 2013-03-05 MED ORDER — FAMOTIDINE IN NACL 20-0.9 MG/50ML-% IV SOLN
20.0000 mg | Freq: Two times a day (BID) | INTRAVENOUS | Status: AC
  Administered 2013-03-05 – 2013-03-06 (×2): 20 mg via INTRAVENOUS
  Filled 2013-03-05: qty 50

## 2013-03-05 MED ORDER — LACTATED RINGERS IV SOLN
INTRAVENOUS | Status: DC | PRN
  Administered 2013-03-05 (×3): via INTRAVENOUS

## 2013-03-05 MED ORDER — PANTOPRAZOLE SODIUM 40 MG PO TBEC
40.0000 mg | DELAYED_RELEASE_TABLET | Freq: Every day | ORAL | Status: DC
  Administered 2013-03-07 – 2013-03-08 (×2): 40 mg via ORAL
  Filled 2013-03-05 (×2): qty 1

## 2013-03-05 MED ORDER — ASPIRIN EC 325 MG PO TBEC
325.0000 mg | DELAYED_RELEASE_TABLET | Freq: Every day | ORAL | Status: DC
  Administered 2013-03-06 – 2013-03-08 (×3): 325 mg via ORAL
  Filled 2013-03-05 (×3): qty 1

## 2013-03-05 MED ORDER — MIDAZOLAM HCL 5 MG/5ML IJ SOLN
INTRAMUSCULAR | Status: DC | PRN
  Administered 2013-03-05 (×2): 2 mg via INTRAVENOUS
  Administered 2013-03-05 (×2): 1 mg via INTRAVENOUS
  Administered 2013-03-05 (×2): 2 mg via INTRAVENOUS
  Administered 2013-03-05: 4 mg via INTRAVENOUS

## 2013-03-05 MED ORDER — HEMOSTATIC AGENTS (NO CHARGE) OPTIME
TOPICAL | Status: DC | PRN
  Administered 2013-03-05: 1 via TOPICAL

## 2013-03-05 MED ORDER — DOCUSATE SODIUM 100 MG PO CAPS
200.0000 mg | ORAL_CAPSULE | Freq: Every day | ORAL | Status: DC
  Administered 2013-03-06 – 2013-03-08 (×3): 200 mg via ORAL
  Filled 2013-03-05 (×3): qty 2

## 2013-03-05 MED ORDER — ASPIRIN 81 MG PO CHEW: 324.0000 mg | CHEWABLE_TABLET | Freq: Every day | ORAL | Status: DC

## 2013-03-05 MED ORDER — ACETAMINOPHEN 500 MG PO TABS
1000.0000 mg | ORAL_TABLET | Freq: Four times a day (QID) | ORAL | Status: DC
  Administered 2013-03-06 – 2013-03-08 (×10): 1000 mg via ORAL
  Filled 2013-03-05 (×14): qty 2

## 2013-03-05 MED ORDER — METOCLOPRAMIDE HCL 5 MG/ML IJ SOLN
10.0000 mg | Freq: Four times a day (QID) | INTRAMUSCULAR | Status: AC
  Administered 2013-03-05 – 2013-03-06 (×4): 10 mg via INTRAVENOUS
  Filled 2013-03-05 (×4): qty 2

## 2013-03-05 MED ORDER — INSULIN REGULAR BOLUS VIA INFUSION
0.0000 [IU] | Freq: Three times a day (TID) | INTRAVENOUS | Status: DC
  Filled 2013-03-05: qty 10

## 2013-03-05 MED ORDER — THROMBIN 20000 UNITS EX SOLR
CUTANEOUS | Status: DC | PRN
  Administered 2013-03-05: 20000 [IU] via TOPICAL

## 2013-03-05 MED ORDER — MAGNESIUM SULFATE 40 MG/ML IJ SOLN
4.0000 g | Freq: Once | INTRAMUSCULAR | Status: AC
  Administered 2013-03-05: 4 g via INTRAVENOUS
  Filled 2013-03-05: qty 100

## 2013-03-05 MED ORDER — LACTATED RINGERS IV SOLN: 500.0000 mL | Freq: Once | INTRAVENOUS | Status: AC | PRN

## 2013-03-05 MED ORDER — ACETAMINOPHEN 160 MG/5ML PO SOLN
1000.0000 mg | Freq: Four times a day (QID) | ORAL | Status: DC
  Filled 2013-03-05: qty 40

## 2013-03-05 MED ORDER — DOPAMINE-DEXTROSE 3.2-5 MG/ML-% IV SOLN: 0.0000 ug/kg/min | INTRAVENOUS | Status: DC

## 2013-03-05 MED ORDER — ONDANSETRON HCL 4 MG/2ML IJ SOLN
4.0000 mg | Freq: Four times a day (QID) | INTRAMUSCULAR | Status: DC | PRN
  Administered 2013-03-06 (×2): 4 mg via INTRAVENOUS
  Filled 2013-03-05 (×3): qty 2

## 2013-03-05 MED ORDER — MIDAZOLAM HCL 2 MG/2ML IJ SOLN: 2.0000 mg | INTRAMUSCULAR | Status: DC | PRN

## 2013-03-05 MED ORDER — 0.9 % SODIUM CHLORIDE (POUR BTL) OPTIME
TOPICAL | Status: DC | PRN
  Administered 2013-03-05: 6000 mL

## 2013-03-05 MED ORDER — GELATIN ABSORBABLE MT POWD
OROMUCOSAL | Status: DC | PRN
  Administered 2013-03-05 (×3): via TOPICAL

## 2013-03-05 MED ORDER — FENTANYL CITRATE 0.05 MG/ML IJ SOLN
INTRAMUSCULAR | Status: DC | PRN
  Administered 2013-03-05: 150 ug via INTRAVENOUS
  Administered 2013-03-05: 50 ug via INTRAVENOUS
  Administered 2013-03-05 (×2): 250 ug via INTRAVENOUS
  Administered 2013-03-05: 100 ug via INTRAVENOUS
  Administered 2013-03-05: 50 ug via INTRAVENOUS
  Administered 2013-03-05: 250 ug via INTRAVENOUS

## 2013-03-05 MED ORDER — MILRINONE IN DEXTROSE 20 MG/100ML IV SOLN
0.1250 ug/kg/min | INTRAVENOUS | Status: DC
  Filled 2013-03-05: qty 100

## 2013-03-05 MED ORDER — DEXMEDETOMIDINE HCL IN NACL 200 MCG/50ML IV SOLN
0.1000 ug/kg/h | INTRAVENOUS | Status: DC
  Administered 2013-03-05: 0.5 ug/kg/h via INTRAVENOUS

## 2013-03-05 MED ORDER — PROTAMINE SULFATE 10 MG/ML IV SOLN
INTRAVENOUS | Status: DC | PRN
  Administered 2013-03-05 (×5): 50 mg via INTRAVENOUS

## 2013-03-05 MED ORDER — SODIUM CHLORIDE 0.9 % IJ SOLN
3.0000 mL | Freq: Two times a day (BID) | INTRAMUSCULAR | Status: DC
  Administered 2013-03-06 – 2013-03-08 (×5): 3 mL via INTRAVENOUS

## 2013-03-05 MED ORDER — PROPOFOL 10 MG/ML IV BOLUS
INTRAVENOUS | Status: DC | PRN
  Administered 2013-03-05: 80 mg via INTRAVENOUS

## 2013-03-05 MED ORDER — DEXTROSE 5 % IV SOLN
1.5000 g | Freq: Two times a day (BID) | INTRAVENOUS | Status: AC
  Administered 2013-03-05 – 2013-03-07 (×4): 1.5 g via INTRAVENOUS
  Filled 2013-03-05 (×4): qty 1.5

## 2013-03-05 MED ORDER — ROCURONIUM BROMIDE 100 MG/10ML IV SOLN
INTRAVENOUS | Status: DC | PRN
  Administered 2013-03-05: 30 mg via INTRAVENOUS
  Administered 2013-03-05: 50 mg via INTRAVENOUS
  Administered 2013-03-05: 20 mg via INTRAVENOUS
  Administered 2013-03-05: 50 mg via INTRAVENOUS

## 2013-03-05 MED ORDER — METOPROLOL TARTRATE 12.5 MG HALF TABLET
12.5000 mg | ORAL_TABLET | Freq: Two times a day (BID) | ORAL | Status: DC
  Filled 2013-03-05 (×3): qty 1

## 2013-03-05 MED ORDER — NITROGLYCERIN IN D5W 200-5 MCG/ML-% IV SOLN: 0.0000 ug/min | INTRAVENOUS | Status: DC

## 2013-03-05 MED ORDER — MORPHINE SULFATE 2 MG/ML IJ SOLN
1.0000 mg | INTRAMUSCULAR | Status: AC | PRN
  Administered 2013-03-06: 4 mg via INTRAVENOUS

## 2013-03-05 MED ORDER — OXYCODONE HCL 5 MG PO TABS
5.0000 mg | ORAL_TABLET | ORAL | Status: DC | PRN
  Administered 2013-03-06 – 2013-03-07 (×6): 5 mg via ORAL
  Administered 2013-03-08: 10 mg via ORAL
  Filled 2013-03-05 (×2): qty 1
  Filled 2013-03-05: qty 2
  Filled 2013-03-05 (×2): qty 1
  Filled 2013-03-05: qty 2
  Filled 2013-03-05 (×2): qty 1

## 2013-03-05 MED ORDER — MORPHINE SULFATE 2 MG/ML IJ SOLN
2.0000 mg | INTRAMUSCULAR | Status: DC | PRN
  Administered 2013-03-05 – 2013-03-06 (×3): 4 mg via INTRAVENOUS
  Administered 2013-03-06 (×2): 2 mg via INTRAVENOUS
  Administered 2013-03-06: 4 mg via INTRAVENOUS
  Filled 2013-03-05 (×3): qty 2
  Filled 2013-03-05: qty 1
  Filled 2013-03-05 (×2): qty 2
  Filled 2013-03-05: qty 1

## 2013-03-05 MED ORDER — ALBUMIN HUMAN 5 % IV SOLN
250.0000 mL | INTRAVENOUS | Status: AC | PRN
  Administered 2013-03-05 (×2): 250 mL via INTRAVENOUS

## 2013-03-05 MED ORDER — ACETAMINOPHEN 650 MG RE SUPP
650.0000 mg | Freq: Once | RECTAL | Status: AC
  Administered 2013-03-05: 650 mg via RECTAL

## 2013-03-05 MED ORDER — THROMBIN 20000 UNITS EX SOLR
CUTANEOUS | Status: AC
  Filled 2013-03-05: qty 20000

## 2013-03-05 MED ORDER — SODIUM CHLORIDE 0.9 % IV SOLN
0.5000 g/h | INTRAVENOUS | Status: DC
  Filled 2013-03-05: qty 20

## 2013-03-05 SURGICAL SUPPLY — 86 items
ADAPTER CARDIO PERF ANTE/RETRO (ADAPTER) ×3 IMPLANT
ATRICLIP ×3 IMPLANT
ATRICLIP EXCLUSION 35 STD HAND (Clip) ×3 IMPLANT
ATTRACTOMAT 16X20 MAGNETIC DRP (DRAPES) ×3 IMPLANT
BAG DECANTER FOR FLEXI CONT (MISCELLANEOUS) ×3 IMPLANT
BLADE STERNUM SYSTEM 6 (BLADE) ×3 IMPLANT
BLADE SURG 15 STRL LF DISP TIS (BLADE) ×2 IMPLANT
BLADE SURG 15 STRL SS (BLADE) ×1
CANISTER SUCTION 2500CC (MISCELLANEOUS) ×3 IMPLANT
CANN PRFSN 3/8X14X24FR PCFC (MISCELLANEOUS) ×4
CANNULA ARTERIAL VENT 3/8 20FR (CANNULA) ×3 IMPLANT
CANNULA GUNDRY RCSP 15FR (MISCELLANEOUS) ×3 IMPLANT
CANNULA PRFSN 3/8X14X24FR PCFC (MISCELLANEOUS) ×4 IMPLANT
CANNULA VEN MTL TIP RT (MISCELLANEOUS) ×2
CANNULA VRC MALB SNGL STG 36FR (MISCELLANEOUS) ×2 IMPLANT
CARDIOBLATE CARDIAC ABLATION (MISCELLANEOUS)
CATH ROBINSON RED A/P 18FR (CATHETERS) ×12 IMPLANT
CATH THORACIC 36FR (CATHETERS) ×3 IMPLANT
CATH THORACIC 36FR RT ANG (CATHETERS) ×3 IMPLANT
CLAMP ISOLATOR SYNERGY LG (MISCELLANEOUS) ×3 IMPLANT
CONN 1/2X1/2X1/2  BEN (MISCELLANEOUS) ×1
CONN 1/2X1/2X1/2 BEN (MISCELLANEOUS) ×2 IMPLANT
CONN 3/8X1/2 ST GISH (MISCELLANEOUS) ×6 IMPLANT
CONT SPEC 4OZ CLIKSEAL STRL BL (MISCELLANEOUS) ×3 IMPLANT
CONT SPEC STER OR (MISCELLANEOUS) ×3 IMPLANT
COVER SURGICAL LIGHT HANDLE (MISCELLANEOUS) ×6 IMPLANT
CRADLE DONUT ADULT HEAD (MISCELLANEOUS) ×3 IMPLANT
DEVICE CARDIOBLATE CARDIAC ABL (MISCELLANEOUS) IMPLANT
DRAPE SLUSH/WARMER DISC (DRAPES) ×3 IMPLANT
DRSG COVADERM 4X14 (GAUZE/BANDAGES/DRESSINGS) ×3 IMPLANT
ELECT CAUTERY BLADE 6.4 (BLADE) ×3 IMPLANT
ELECT REM PT RETURN 9FT ADLT (ELECTROSURGICAL) ×6
ELECTRODE REM PT RTRN 9FT ADLT (ELECTROSURGICAL) ×4 IMPLANT
GLOVE BIO SURGEON STRL SZ 6 (GLOVE) ×6 IMPLANT
GLOVE BIO SURGEON STRL SZ 6.5 (GLOVE) ×3 IMPLANT
GLOVE BIO SURGEON STRL SZ7 (GLOVE) IMPLANT
GLOVE BIO SURGEON STRL SZ7.5 (GLOVE) IMPLANT
GLOVE BIOGEL PI IND STRL 6 (GLOVE) ×6 IMPLANT
GLOVE BIOGEL PI IND STRL 6.5 (GLOVE) ×4 IMPLANT
GLOVE BIOGEL PI IND STRL 7.0 (GLOVE) ×8 IMPLANT
GLOVE BIOGEL PI INDICATOR 6 (GLOVE) ×3
GLOVE BIOGEL PI INDICATOR 6.5 (GLOVE) ×2
GLOVE BIOGEL PI INDICATOR 7.0 (GLOVE) ×4
GLOVE EUDERMIC 7 POWDERFREE (GLOVE) ×6 IMPLANT
GOWN PREVENTION PLUS XLARGE (GOWN DISPOSABLE) ×3 IMPLANT
GOWN STRL NON-REIN LRG LVL3 (GOWN DISPOSABLE) ×15 IMPLANT
HEART VENT LT CURVED (MISCELLANEOUS) ×3 IMPLANT
HEMOSTAT POWDER SURGIFOAM 1G (HEMOSTASIS) ×9 IMPLANT
HEMOSTAT SURGICEL 2X14 (HEMOSTASIS) ×3 IMPLANT
KIT BASIN OR (CUSTOM PROCEDURE TRAY) ×3 IMPLANT
KIT CATH CPB BARTLE (MISCELLANEOUS) IMPLANT
KIT ROOM TURNOVER OR (KITS) ×3 IMPLANT
KIT SUCTION CATH 14FR (SUCTIONS) ×3 IMPLANT
LINE VENT (MISCELLANEOUS) ×3 IMPLANT
NS IRRIG 1000ML POUR BTL (IV SOLUTION) ×18 IMPLANT
PACK OPEN HEART (CUSTOM PROCEDURE TRAY) ×3 IMPLANT
PAD ARMBOARD 7.5X6 YLW CONV (MISCELLANEOUS) ×6 IMPLANT
PROBE CRYO2-ABLATION MALLABLE (MISCELLANEOUS) ×3 IMPLANT
RING MITRAL MEMO 3D 26MM SMD26 (Prosthesis & Implant Heart) ×3 IMPLANT
SET CARDIOPLEGIA MPS 5001102 (MISCELLANEOUS) ×3 IMPLANT
SPONGE GAUZE 4X4 12PLY (GAUZE/BANDAGES/DRESSINGS) ×3 IMPLANT
SUT BONE WAX W31G (SUTURE) ×3 IMPLANT
SUT ETHIBON 2 0 V 52N 30 (SUTURE) ×6 IMPLANT
SUT ETHIBOND (SUTURE) ×6 IMPLANT
SUT ETHIBOND 2-0 RB-1 WHT (SUTURE) ×6 IMPLANT
SUT PROLENE 3 0 SH 1 (SUTURE) ×3 IMPLANT
SUT PROLENE 3 0 SH DA (SUTURE) IMPLANT
SUT PROLENE 3 0 SH1 36 (SUTURE) ×9 IMPLANT
SUT PROLENE 4 0 RB 1 (SUTURE) ×8
SUT PROLENE 4-0 RB1 .5 CRCL 36 (SUTURE) ×16 IMPLANT
SUT SILK 2 0 SH CR/8 (SUTURE) ×3 IMPLANT
SUT STEEL 6MS V (SUTURE) IMPLANT
SUT STEEL SZ 6 DBL 3X14 BALL (SUTURE) ×9 IMPLANT
SUT VIC AB 1 CTX 36 (SUTURE) ×2
SUT VIC AB 1 CTX36XBRD ANBCTR (SUTURE) ×4 IMPLANT
SUTURE E-PAK OPEN HEART (SUTURE) ×3 IMPLANT
SYS ATRICLIP LAA EXCLUSION 45 (CLIP) IMPLANT
SYSTEM SAHARA CHEST DRAIN ATS (WOUND CARE) ×3 IMPLANT
TOWEL OR 17X24 6PK STRL BLUE (TOWEL DISPOSABLE) ×6 IMPLANT
TOWEL OR 17X26 10 PK STRL BLUE (TOWEL DISPOSABLE) ×6 IMPLANT
TRAY FOLEY IC TEMP SENS 14FR (CATHETERS) ×3 IMPLANT
TUBE SUCT INTRACARD DLP 20F (MISCELLANEOUS) ×3 IMPLANT
UNDERPAD 30X30 INCONTINENT (UNDERPADS AND DIAPERS) ×3 IMPLANT
VALVE MAGNA EASE 21MM (Prosthesis & Implant Heart) ×3 IMPLANT
VRC MALLEABLE SINGLE STG 36FR (MISCELLANEOUS) ×3
WATER STERILE IRR 1000ML POUR (IV SOLUTION) ×6 IMPLANT

## 2013-03-05 NOTE — Brief Op Note (Signed)
02/28/2013 - 03/05/2013  12:54 PM  PATIENT:  Nicole [name]  77 y.o. female  PRE-OPERATIVE DIAGNOSIS:  1. [name] Aortic Stenosis 2.Atrial Fibrillation 3. Mild to moderate Mitral Regurgitation  POST-OPERATIVE DIAGNOSIS:  1. [name] Aortic Stenosis 2.Atrial Fibrillation 3. Mild to moderate Mitral Regurgitation   PROCEDURE:  INTRAOPERATIVE TRANSESOPHAGEAL ECHOCARDIOGRAM,MEDIAN STERNOTOMY FOR AORTIC VALVE REPLACEMENT (AVR) (using a Pericardial TIssue valve, size 21 mm) and MITRAL VALVE REPAIR (using a Memo 3D Annuloplasty Ring, size 26), RIGHT AND LEFT SIDED MAZE, and LA CLIP   SURGEON:  Surgeon(s) and Role:    * Alleen Borne, MD - Primary  PHYSICIAN ASSISTANT: Doree Fudge PA-C   ANESTHESIA:   general  EBL:  Total I/O In: 1000 [I.V.:1000] Out: 865 [Urine:865]  BLOOD ADMINISTERED:Two PLTS  DRAINS: Chest Tube(s) in the Mediastinal and pleura spaces   SPECIMEN:  Source of Specimen:  Native AV leaflets  DISPOSITION OF SPECIMEN:  PATHOLOGY  COUNTS CORRECT:  YES  DICTATION: .Dragon Dictation  PLAN OF CARE: Admit to inpatient   PATIENT DISPOSITION:  ICU - intubated and hemodynamically stable.   Delay start of Pharmacological VTE agent (>24hrs) due to surgical blood loss or risk of bleeding: yes  PRE OP WEIGHT: 87 kg

## 2013-03-05 NOTE — Anesthesia Preprocedure Evaluation (Addendum)
Anesthesia Evaluation  Patient identified by MRN, date of birth, ID band Patient awake    Reviewed: Allergy & Precautions, H&P , NPO status , Patient's Chart, lab work & pertinent test results, reviewed documented beta blocker date and time   Airway Mallampati: II TM Distance: >3 FB Neck ROM: full    Dental  (+) Edentulous Upper, Edentulous Lower and Dental Advisory Given   Pulmonary shortness of breath and with exertion, COPD COPD inhaler,    + wheezing      Cardiovascular +CHF + dysrhythmias Atrial Fibrillation + Valvular Problems/Murmurs AS and MR Rhythm:Irregular Rate:Normal     Neuro/Psych Anxiety    GI/Hepatic   Endo/Other  obese  Renal/GU      Musculoskeletal   Abdominal (+)  Abdomen: soft. Bowel sounds: normal.  Peds  Hematology   Anesthesia Other Findings   Reproductive/Obstetrics                       Anesthesia Physical Anesthesia Plan  ASA: III  Anesthesia Plan: General   Post-op Pain Management:    Induction: Intravenous  Airway Management Planned: Oral ETT  Additional Equipment: Arterial line, CVP, PA Cath, TEE and Ultrasound Guidance Line Placement  Intra-op Plan:   Post-operative Plan: Post-operative intubation/ventilation  Informed Consent: I have reviewed the patients History and Physical, chart, labs and discussed the procedure including the risks, benefits and alternatives for the proposed anesthesia with the patient or authorized representative who has indicated his/her understanding and acceptance.     Plan Discussed with: CRNA, Anesthesiologist and Surgeon  Anesthesia Plan Comments:         Anesthesia Quick Evaluation

## 2013-03-05 NOTE — Progress Notes (Signed)
  Echocardiogram Echocardiogram Transesophageal (OR) has been performed.  Hilmar Moldovan 03/05/2013, 8:18 AM

## 2013-03-05 NOTE — Anesthesia Procedure Notes (Signed)
Procedure Name: Intubation Date/Time: 03/05/2013 7:50 AM Performed by: Ellin Goodie Pre-anesthesia Checklist: Patient identified, Emergency Drugs available, Suction available, Patient being monitored and Timeout performed Patient Re-evaluated:Patient Re-evaluated prior to inductionOxygen Delivery Method: Circle system utilized Preoxygenation: Pre-oxygenation with 100% oxygen Intubation Type: IV induction Ventilation: Mask ventilation without difficulty Laryngoscope Size: Mac and 3 Grade View: Grade I Tube type: Oral Tube size: 8.0 mm Number of attempts: 1 Airway Equipment and Method: Stylet Placement Confirmation: ETT inserted through vocal cords under direct vision,  positive ETCO2 and breath sounds checked- equal and bilateral Secured at: 22 cm Tube secured with: Tape Dental Injury: Teeth and Oropharynx as per pre-operative assessment  Comments: Easy atraumatic induction and intubation with MAC 3 blade.  Dr. Chaney Malling verified placement of ETT.  Carlynn Herald, CRNA

## 2013-03-05 NOTE — Transfer of Care (Signed)
Immediate Anesthesia Transfer of Care Note  Patient: Nicole [name]  Procedure(s) Performed: Procedure(s): AORTIC VALVE REPLACEMENT (AVR) (N/A) MAZE (N/A) INTRAOPERATIVE TRANSESOPHAGEAL ECHOCARDIOGRAM (N/A) MITRAL VALVE REPAIR (MVR) (N/A)  Patient Location: SICU  Anesthesia Type:General  Level of Consciousness: Patient remains intubated per anesthesia plan  Airway & Oxygen Therapy: Patient remains intubated per anesthesia plan  Post-op Assessment: Post -op Vital signs reviewed and stable  Post vital signs: stable  Complications: No apparent anesthesia complications

## 2013-03-05 NOTE — Op Note (Signed)
CARDIOVASCULAR SURGERY OPERATIVE NOTE  03/05/2013 [name] [id number]  Surgeon:  Alleen Borne, MD   First Assistant: Doree Fudge, [facility]   Preoperative Diagnosis:  Severe aortic stenosis                                              Moderate mitral regurgitation                                              Paroxysmal atrial fibrillation  Postoperative Diagnosis:  Same   Procedure:  1. Median Sternotomy 2. Extracorporeal circulation 3.   Aortic valve replacement using a 21 mm  Edwards Magna-ease pericardial valve. 4.   Mitral valve annuloplasty using a 26 mm Sorin 3-D Memo Ring 5.   Clipping of Left atrial appendage using a 35 mm Atricure Atriclip. 6.   Cox Maze IV  Anesthesia:  General Endotracheal   Clinical History/Surgical Indication:  77 year old woman with hx of smoking, COPD, hyperlipidemia who reports developing shortness of breath in late July. She was noted to be in atrial fibrillation for the first time and was hospitalized twice in [place] and cardioverted. She was treated with amiodarone. She was seen by Dr. Shirlee Latch and was back in A-fib and cardioverted again on 02/13/2013. She said she felt much better but then on Monday became short of breath again with minimal activity with tachypalpitations. She had a 2D echo on 02/13/2013 showing moderate to severe aortic stenosis with AVA of 0.51 with a mean gradient of 31 and a peak of 46. A TEE on 03/01/2013 showed severe AS with a mean gradient of 43 and AVA of 0.79 with a heavily calcified aortic valve. There is mild to moderate MR with a thickened valve. 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 She has severe symptomatic AS with paroxysmal atrial fibrillation which is probably making her symptoms worse. Her daughter is an ICU nurse here and has noticed her shortness of breath and fatigue since March. I think the A-fib threw her over the  edge. I agree with the need for AVR and MAZE. She does not tolerate the A-fib and has had recurrent A-fib despite amiodarone. She does have some MR but her valve is thickened and somewhat rheumatic appearing and repair may not change that. I don' t think mitral valve replacement is indicated so I would probably leave that alone. I discussed the aortic valve replacement using a tissue valve and the MAZE procedure in detail with the patient and her daughter. I told them it is about 80% effective at preventing A-fib. I discussed the operative procedure with the patient and daughter including alternatives, benefits and risks; including but not limited to bleeding, blood transfusion, infection, stroke, myocardial infarction, graft failure, heart block requiring a permanent pacemaker, organ dysfunction, and death. [name] understands and agrees to proceed.    Preparation:  The patient was seen in the preoperative holding area and the correct patient, correct operation were confirmed with the patient after reviewing the medical record and catheterization. The consent was signed by me. Preoperative antibiotics were given. A pulmonary arterial line and radial arterial line were placed by the anesthesia  team. The patient was taken back to the operating room and positioned supine on the operating room table. After being placed under general endotracheal anesthesia by the anesthesia team a foley catheter was placed. The neck, chest, abdomen, and both legs were prepped with betadine soap and solution and draped in the usual sterile manner. A surgical time-out was taken and the correct patient and operative procedure were confirmed with the nursing and anesthesia staff.   Pre-bypass TEE:   Complete TEE assessment was performed by Dr. Achille Rich. This showed severe calcific aortic stenosis. There was moderate MR with a central jet. The mitral valve structure looked fairly normal with mild leaflet thickening, no  prolapse or flail segments. LV function was preserved. The PA pressures were elevated with a PAD of 30.     Post-bypass TEE:   Normal functioning prosthetic aortic valve with no perivalvular leak or regurgitation through the valve. Left ventricular function preserved. No mitral regurgitation.    Cardiopulmonary Bypass:  A median sternotomy was performed. The pericardium was opened in the midline. Right ventricular function appeared normal. The ascending aorta was of normal size and had no palpable plaque. There were no contraindications to aortic cannulation or cross-clamping. The patient was fully systemically heparinized and the ACT was maintained > 400 sec. The proximal aortic arch was cannulated with a 20 F aortic cannula for arterial inflow. Venous cannulation was performed using bicaval cannulation with a 24 F metal tip right angle cannula in the SVC and a 36 F plastic cannula in the IVC.  An antegrade cardioplegia/vent cannula was inserted into the mid-ascending aorta.  A retrograde cardioplegia cannnula was placed into the coronary sinus via the right atrium. Aortic occlusion was performed with a single cross-clamp. Systemic cooling to 28 degrees Centigrade and topical cooling of the heart with iced saline were used. Hyperkalemic antegrade cold blood cardioplegia was used to induce diastolic arrest and then cold blood retrograde cardioplegia was given at about 20 minute intervals throughout the period of arrest to maintain myocardial temperature at or below 10 degrees centigrade. A temperature probe was inserted into the interventricular septum and an insulating pad was placed in the pericardium. Carbon dioxide was insufflated into the pericardium at 5L/min throughout the procedure to minimize intracardiac air.  Cox Maze IV:  After achieving diastolic arrest the left pulmonary veins were circled with a tape. An Atricure bipolar RF clamp was used to create a lesion around the pulmonary veins  on the left atrial wall. An RF lesion was then created at the base of the left atrial appendage. The cryo probe was then used to create a lesion joining the LAA and the left pulmonary veins. All cryo lesions were for 2 minutes. The left atrium was opened by a vertical incision in the interatrial groove. An encircling lesion was then created around the right pulmonary veins using the atriotomy incision anteriorly and a cryo lesion posteriorly. Then cryo lesions were created to join the pulmonary vein lesions superiorly and inferiorly. Another cryo lesion was placed between the pulmonary vein lesions in the midportion of the left atrium due to marked left atrial enlargement. A cryo lesion was placed between the inferior lesion that joined the pulmonary veins and the posterior mitral annulus at P2-3. A lesion was placed externally across the coronary sinus. The mitral valve repair was completed and then the right atrium was opened obliquely from the atrial septum posteriorly to the AV groove anteriorly. A cryo lesion was created to join the  SVC and IVC posteriorly along the interatrial septum. A cryo lesion was created from this lesion, across the interatrial septum to the posterior aspect of the coronary sinus and then up to the tricuspid annulus. A cryo lesion was placed from the anterior aspect of this oblique atriotomy down to the tricuspid annulus. Another cryo lesion was placed obliquely across the RAA. A cryo lesion was placed from the RAA down to the tricuspid annulus. The right atriotomy was then closed with two layers of 4-0 prolene suture.    Ligation of left atrial appendage:  The base of the appendage was measured and a small 34 mm Atricure Atriclip was chosen. This was placed across the base of the LAA without difficulty.     Aortic Valve Replacement:   A transverse aortotomy was performed 1 cm above the take-off of the right coronary artery. The native valve was tricuspid with calcified  leaflets and mild annular calcification. The ostia of the coronary arteries were immediately adjacent and were coming off the right sinus and were not obstructed. There was some calcification of the aortic wall just above the coronary ostia. The native valve leaflets were excised and the annulus was decalcified with rongeurs. Care was taken to remove all particulate debris. The left ventricle was directly inspected for debris and then irrigated with ice saline solution. The annulus was sized and a size 21 mm  Edwards Magna-ease pericardial valve was chosen. The model number was [id number] and the serial number was [id number]. While the valve was being prepared 2-0 Ethibond pledgeted horizontal mattress sutures were placed around the annulus with the pledgets in a sub-annular position. The sutures were placed through the sewing ring and the valve lowered into place. The sutures were tied sequentially. The valve seated nicely and the coronary ostia were not obstructed. The prosthetic valve leaflets moved normally and there was no sub-valvular obstruction. The aortotomy was closed using 4-0 Prolene suture in 2 layers with felt strips to reinforce the closure.  Mitral Valve Repair:  The left atrium was opened through a vertical incision in the interatrial groove. Exposure was good. Valve inspection showed mildly thickened leaflets with normal subvalvular apparatus. There was no prolapse and the regurgitation appeared to be related to a loss of coaptation from annular enlargement. A series of pledgetted 2-0 Ethibond sutures were placed around the mitral annulus. The anterior leaflet was sized and a 26 mm Sorin 3D Memo annuloplasty ring was chosen.  This had REF # [id number] and Serial # [id number]. The sutures were placed through the ring and it was lowered into place. The sutures were tied. The valve was tested with saline with complete distension of the LV and there was no regurgitation. The atrium was closed with 2 layers of  continuous 3-0 prolene suture.   Completion:  The patient was rewarmed to 37 degrees Centigrade. De-airing maneuvers were performed and the head placed in trendelenburg position. The crossclamp was removed with a time of 207 minutes. There was spontaneous return of sinus rhythm. The aortotomy was checked for hemostasis. Two temporary epicardial pacing wires were placed on the right atrium and two on the right ventricle. The left ventricular vent and retrograde cardioplegia cannulas were removed. The patient was weaned from CPB without difficulty on no inotropes. CPB time was 227 minutes. Cardiac output was 3.9 LPM. Heparin was fully reversed with protamine and the aortic and venous cannulas removed. Hemostasis was achieved. Mediastinal drainage tubes were placed. The sternum was closed with double #6 stainless  steel wires. The fascia was closed with continuous # 1 vicryl suture. The subcutaneous tissue was closed with 2-0 vicryl continuous suture. The skin was closed with 3-0 vicryl subcuticular suture. All sponge, needle, and instrument counts were reported correct at the end of the case. Dry sterile dressings were placed over the incisions and around the chest tubes which were connected to pleurevac suction. The patient was then transported to the surgical intensive care unit in critical but stable condition.

## 2013-03-05 NOTE — Procedures (Signed)
Extubation Procedure Note  Patient Details:   Name: Nicole [name] DOB: September 16, 1935 MRN: [id number]   Airway Documentation:  Airway 8 mm (Active)  Secured at (cm) 21 cm 03/05/2013  8:05 PM  Measured From Lips 03/05/2013  8:05 PM  Secured Location Right 03/05/2013  8:05 PM  Secured By Pink Tape 03/05/2013  8:05 PM  Cuff Pressure (cm H2O) 22 cm H2O 03/05/2013  8:05 PM  Site Condition Dry 03/05/2013  8:05 PM    Evaluation  O2 sats: stable throughout Complications: No apparent complications Patient did tolerate procedure well. Bilateral Breath Sounds: Clear;Diminished Suctioning: Airway Yes  NIF -28cmH20 FVC .950L  Patient suctioned prior to extubation.  Positive cuff leak.  Pt extubated to nebulizer treatment for scheduled neb tx.  No stridor noted; clear/diminished BBS.  Pt placed to venturi mask after nebulizer treatment for mouth breathing.  Malachi Paradise 03/05/2013, 8:24 PM

## 2013-03-05 NOTE — Progress Notes (Signed)
Patient ID: Nicole [name], female   DOB: 12-11-1935, 77 y.o.   MRN: [id number] EVENING ROUNDS NOTE :     [address]       [place] [address]             [phone]                 Day of Surgery Procedure(s) (LRB): AORTIC VALVE REPLACEMENT (AVR) (N/A) MAZE (N/A) INTRAOPERATIVE TRANSESOPHAGEAL ECHOCARDIOGRAM (N/A) MITRAL VALVE REPAIR (MVR) (N/A)  Total Length of Stay:  LOS: 5 days  BP 112/59  Pulse 80  Temp(Src) 98.1 F (36.7 C) (Core (Comment))  Resp 13  Ht 5\' 3"  (1.6 m)  Wt 192 lb 10.9 oz (87.4 kg)  BMI 34.14 kg/m2  SpO2 98%  .Intake/Output     10/05 0701 - 10/06 0700 10/06 0701 - 10/07 0700   P.O. 800    I.V. (mL/kg) 428 (4.9) 2522.2 (28.9)   Blood  1118   IV Piggyback  650   Total Intake(mL/kg) 1228 (14.1) 4290.2 (49.1)   Urine (mL/kg/hr) 1450 (0.7) 2360 (2.6)   Stool     Blood  1150 (1.3)   Chest Tube  140 (0.2)   Total Output 1450 3650   Net -222 +640.2          . sodium chloride 20 mL/hr at 03/05/13 1500  . sodium chloride    . [START ON 03/06/2013] sodium chloride    . dexmedetomidine Stopped (03/05/13 1630)  . DOPamine 2 mcg/kg/min (03/05/13 1545)  . insulin (NOVOLIN-R) infusion 2.2 Units/hr (03/05/13 1700)  . lactated ringers 20 mL/hr at 03/05/13 1430  . nitroGLYCERIN    . phenylephrine (NEO-SYNEPHRINE) Adult infusion Stopped (03/05/13 1545)     Lab Results  Component Value Date   WBC 15.3* 03/05/2013   HGB 10.2* 03/05/2013   HCT 30.0* 03/05/2013   PLT 133* 03/05/2013   GLUCOSE 118* 03/05/2013   ALT 32 02/28/2013   AST 23 02/28/2013   NA 141 03/05/2013   K 4.8 03/05/2013   CL 101 03/04/2013   CREATININE 0.98 03/04/2013   BUN 15 03/04/2013   CO2 25 03/04/2013   TSH 0.488 02/28/2013   INR 1.61* 03/05/2013   HGBA1C 5.4 03/04/2013   Remains sedated on vent Not bleeding Bp stable   Delight Ovens MD  Beeper [phone] Office [phone] 03/05/2013 5:19 PM

## 2013-03-05 NOTE — Preoperative (Signed)
Beta Blockers   Reason not to administer Beta Blockers:Not Applicable 

## 2013-03-05 NOTE — Progress Notes (Signed)
  Stable weekend. Ready for AVR and MAZE.

## 2013-03-05 NOTE — OR Nursing (Signed)
First call made to SICU at 1315.  Volunteer desk also called at this time to notify family that patient was off bypass.  1340 second call to SICU

## 2013-03-06 ENCOUNTER — Inpatient Hospital Stay (HOSPITAL_COMMUNITY): Payer: Medicare Other

## 2013-03-06 LAB — PREPARE PLATELET PHERESIS: Unit division: 0

## 2013-03-06 LAB — POCT I-STAT, CHEM 8
Calcium, Ion: 1.24 mmol/L (ref 1.13–1.30)
Chloride: 104 mEq/L (ref 96–112)
Creatinine, Ser: 1.1 mg/dL (ref 0.50–1.10)
Glucose, Bld: 117 mg/dL — ABNORMAL HIGH (ref 70–99)
HCT: 32 % — ABNORMAL LOW (ref 36.0–46.0)
TCO2: 23 mmol/L (ref 0–100)

## 2013-03-06 LAB — GLUCOSE, CAPILLARY
Glucose-Capillary: 112 mg/dL — ABNORMAL HIGH (ref 70–99)
Glucose-Capillary: 113 mg/dL — ABNORMAL HIGH (ref 70–99)
Glucose-Capillary: 114 mg/dL — ABNORMAL HIGH (ref 70–99)
Glucose-Capillary: 114 mg/dL — ABNORMAL HIGH (ref 70–99)
Glucose-Capillary: 110 mg/dL — ABNORMAL HIGH (ref 70–99)
Glucose-Capillary: 103 mg/dL — ABNORMAL HIGH (ref 70–99)
Glucose-Capillary: 130 mg/dL — ABNORMAL HIGH (ref 70–99)

## 2013-03-06 LAB — BASIC METABOLIC PANEL
CO2: 23 mEq/L (ref 19–32)
Chloride: 105 mEq/L (ref 96–112)
GFR calc Af Amer: >90 mL/min (ref >90)
GFR calc non Af Amer: 81 mL/min — ABNORMAL LOW (ref >90)
Glucose, Bld: 203 mg/dL — ABNORMAL HIGH (ref 70–99)
Potassium: 4.9 mEq/L (ref 3.5–5.1)
Sodium: 135 mEq/L (ref 135–145)

## 2013-03-06 LAB — CBC
HCT: 27.8 % — ABNORMAL LOW (ref 36.0–46.0)
HCT: 29 % — ABNORMAL LOW (ref 36.0–46.0)
Hemoglobin: 9.4 g/dL — ABNORMAL LOW (ref 12.0–15.0)
Hemoglobin: 9.7 g/dL — ABNORMAL LOW (ref 12.0–15.0)
MCH: 29.9 pg (ref 26.0–34.0)
MCH: 29.8 pg (ref 26.0–34.0)
MCHC: 33.8 g/dL (ref 30.0–36.0)
RBC: 3.14 MIL/uL — ABNORMAL LOW (ref 3.87–5.11)
RBC: 3.25 MIL/uL — ABNORMAL LOW (ref 3.87–5.11)
WBC: 17.4 10*3/uL — ABNORMAL HIGH (ref 4.0–10.5)
WBC: 18.6 10*3/uL — ABNORMAL HIGH (ref 4.0–10.5)

## 2013-03-06 LAB — CREATININE, SERUM
Creatinine, Ser: 0.98 mg/dL (ref 0.50–1.10)
GFR calc Af Amer: 64 mL/min — ABNORMAL LOW (ref >90)
GFR calc non Af Amer: 55 mL/min — ABNORMAL LOW (ref >90)

## 2013-03-06 LAB — MAGNESIUM: Magnesium: 3 mg/dL — ABNORMAL HIGH (ref 1.5–2.5)

## 2013-03-06 MED ORDER — DEXTROSE 50 % IV SOLN
INTRAVENOUS | Status: AC
  Filled 2013-03-06: qty 50

## 2013-03-06 MED ORDER — INSULIN ASPART 100 UNIT/ML ~~LOC~~ SOLN
0.0000 [IU] | SUBCUTANEOUS | Status: DC
  Administered 2013-03-06 – 2013-03-08 (×8): 2 [IU] via SUBCUTANEOUS

## 2013-03-06 MED ORDER — ENOXAPARIN SODIUM 40 MG/0.4ML ~~LOC~~ SOLN
40.0000 mg | Freq: Every day | SUBCUTANEOUS | Status: DC
  Administered 2013-03-06 – 2013-03-10 (×5): 40 mg via SUBCUTANEOUS
  Filled 2013-03-06 (×7): qty 0.4

## 2013-03-06 MED ORDER — INSULIN ASPART 100 UNIT/ML ~~LOC~~ SOLN: 0.0000 [IU] | SUBCUTANEOUS | Status: DC

## 2013-03-06 MED FILL — Heparin Sodium (Porcine) Inj 1000 Unit/ML: INTRAMUSCULAR | Qty: 30 | Status: AC

## 2013-03-06 MED FILL — Magnesium Sulfate Inj 50%: INTRAMUSCULAR | Qty: 10 | Status: AC

## 2013-03-06 MED FILL — Potassium Chloride Inj 2 mEq/ML: INTRAVENOUS | Qty: 40 | Status: AC

## 2013-03-06 MED FILL — Sodium Chloride IV Soln 0.9%: INTRAVENOUS | Qty: 1000 | Status: AC

## 2013-03-06 NOTE — Clinical Documentation Improvement (Signed)
THIS DOCUMENT IS NOT A PERMANENT PART OF THE MEDICAL RECORD  Please update your documentation with the medical record to reflect your response to this query. If you need help knowing how to do this please call [phone].  03/06/13  Dear Consepcion Hearing Marton Redwood  In an effort to better capture your patient's severity of illness, reflect appropriate length of stay and utilization of resources, a review of the patient medical record has revealed the following indicators.    Based on your clinical judgment, please clarify and document in a progress note and/or discharge summary the clinical condition associated with the following supporting information:  In responding to this query please exercise your independent judgment.  The fact that a query is asked, does not imply that any particular answer is desired or expected.  Possible Clinical Conditions?   " Expected Acute Blood Loss Anemia  " Acute Blood Loss Anemia  " Acute on chronic blood loss anemia  " Precipitous drop in Hematocrit  " Other Condition  " Cannot Clinically Determine    Risk Factors:  EBL: 1150 ml per 10/06 Anesthesia record.  Diagnostics: H&H on 10/01:   11/0/33/5 H&H on 10/06:    7.5/22.0  IV fluids / plasma expanders: Per 10/06 Anesthesia record: Cell saver:  665 ml. Albumin 5%:  250 ml. Plts pheres:  453 ml. LR:  2200 ml.   Reviewed:    Thank You,  Marciano Sequin,  Clinical Documentation Specialist: [phone] Health Information Management [place]

## 2013-03-06 NOTE — Progress Notes (Signed)
Patient ID: Nicole [name], female   DOB: 1935-11-04, 77 y.o.   MRN: [id number]  SICU Evening Rounds  Hemodynamically stable on dop 2 mcg  Urine output 40/hr. BMET    Component Value Date/Time   NA 136 03/06/2013 1624   K 4.9 03/06/2013 1624   CL 104 03/06/2013 1624   CO2 23 03/06/2013 0315   GLUCOSE 117* 03/06/2013 1624   BUN 18 03/06/2013 1624   CREATININE 1.10 03/06/2013 1624   CREATININE 1.04 02/09/2013 1630   CALCIUM 7.8* 03/06/2013 0315   GFRNONAA 55* 03/06/2013 1622   GFRAA 64* 03/06/2013 1622     Ambulated outside of room with 2 person assist. She will need PT.

## 2013-03-06 NOTE — Progress Notes (Signed)
1 Day Post-Op Procedure(s) (LRB): AORTIC VALVE REPLACEMENT (AVR) (N/A) MAZE (N/A) INTRAOPERATIVE TRANSESOPHAGEAL ECHOCARDIOGRAM (N/A) MITRAL VALVE REPAIR (MVR) (N/A) Subjective:  No complaints  Objective: Vital signs in last 24 hours: Temp:  [97.5 F (36.4 C)-98.4 F (36.9 C)] 98.4 F (36.9 C) (10/07 0700) Pulse Rate:  [79-81] 79 (10/07 0700) Cardiac Rhythm:  [-] A-V Sequential paced (10/07 0600) Resp:  [11-28] 14 (10/07 0700) BP: (89-122)/(43-64) 111/43 mmHg (10/07 0700) SpO2:  [84 %-100 %] 97 % (10/07 0700) Arterial Line BP: (86-137)/(50-71) 86/52 mmHg (10/07 0700) FiO2 (%):  [35 %-50 %] 35 % (10/06 2300)  Rhythm under pacer is junctional or idioventricular in 40's.  Hemodynamic parameters for last 24 hours: PAP: (37-60)/(20-34) 50/31 mmHg CO:  [2.8 L/min-5.2 L/min] 4.3 L/min CI:  [1.5 L/min/m2-2.8 L/min/m2] 2.3 L/min/m2  Intake/Output from previous day: 10/06 0701 - 10/07 0700 In: 5397.4 [I.V.:3279.4; Blood:1118; IV Piggyback:1000] Out: 4810 [Urine:3350; Blood:1150; Chest Tube:310] Intake/Output this shift:    General appearance: alert and cooperative Neurologic: intact Heart: regular rate and rhythm, S1, S2 normal, no murmur, click, rub or gallop Lungs: wheezes bilaterally and mild Extremities: edema mild Wound: dressing dry  Lab Results:  Recent Labs  03/05/13 2117 03/05/13 2119 03/06/13 0315  WBC 17.2*  --  17.4*  HGB 9.8* 8.8* 9.4*  HCT 28.9* 26.0* 27.8*  PLT 128*  --  130*   BMET:  Recent Labs  03/04/13 2000  03/05/13 2119 03/06/13 0315  NA 138  < > 138 135  K 4.0  < > 4.8 4.9  CL 101  --  110 105  CO2 25  --   --  23  GLUCOSE 140*  < > 124* 203*  BUN 15  --  11 13  CREATININE 0.98  < > 0.80 0.74  CALCIUM 8.6  --   --  7.8*  < > = values in this interval not displayed.  PT/INR:  Recent Labs  03/05/13 1400  LABPROT 18.7*  INR 1.61*   ABG    Component Value Date/Time   PHART 7.302* 03/05/2013 2111   HCO3 23.8 03/05/2013 2111   TCO2 24 03/05/2013 2119   ACIDBASEDEF 3.0* 03/05/2013 2111   O2SAT 94.0 03/05/2013 2111   CBG (last 3)   Recent Labs  03/06/13 0212 03/06/13 0312 03/06/13 0411  GLUCAP 114* 176* 107*    Assessment/Plan: S/P Procedure(s) (LRB): AORTIC VALVE REPLACEMENT (AVR) (N/A) MAZE (N/A) INTRAOPERATIVE TRANSESOPHAGEAL ECHOCARDIOGRAM (N/A) MITRAL VALVE REPAIR (MVR) (N/A) She is hemodynamically stable but requiring low dose dopamine and neo to support BP. She has moderate pulmonary hypertension with PAD in the low 30's which is unchanged from preop. This may be due to long smoking hx with COPD, obesity, diastolic dysfunction. She had moderate MR preop but correcting this did not drop the PA pressures.  Will wean neo and dopamine as tolerated.  Paroxysmal A-fib: s/p Maze. Will AV pace for now and observe rhythm. Hold amiodarone since he has slow escape rhythm at this time. No beta blocker.   Hx of smoking until a couple months ago with COPD. Unfortunately PFT's that were ordered preop were not done. Will use bronchodilators since she is wheezing.  Start coumadin tomorrow.  Mobilize Diuresis once off vasopressors. Diabetes control d/c tubes/lines Continue foley due to patient in ICU and urinary output monitoring See progression orders   LOS: 6 days    BARTLE,BRYAN K 03/06/2013

## 2013-03-07 ENCOUNTER — Inpatient Hospital Stay (HOSPITAL_COMMUNITY): Payer: Medicare Other

## 2013-03-07 LAB — GLUCOSE, CAPILLARY
Glucose-Capillary: 63 mg/dL — ABNORMAL LOW (ref 70–99)
Glucose-Capillary: 62 mg/dL — ABNORMAL LOW (ref 70–99)
Glucose-Capillary: 113 mg/dL — ABNORMAL HIGH (ref 70–99)
Glucose-Capillary: 106 mg/dL — ABNORMAL HIGH (ref 70–99)
Glucose-Capillary: 125 mg/dL — ABNORMAL HIGH (ref 70–99)
Glucose-Capillary: 123 mg/dL — ABNORMAL HIGH (ref 70–99)
Glucose-Capillary: 127 mg/dL — ABNORMAL HIGH (ref 70–99)
Glucose-Capillary: 127 mg/dL — ABNORMAL HIGH (ref 70–99)

## 2013-03-07 LAB — CBC
MCH: 29.5 pg (ref 26.0–34.0)
MCV: 88.3 fL (ref 78.0–100.0)
Platelets: 133 10*3/uL — ABNORMAL LOW (ref 150–400)
RDW: 15.6 % — ABNORMAL HIGH (ref 11.5–15.5)
WBC: 17 10*3/uL — ABNORMAL HIGH (ref 4.0–10.5)

## 2013-03-07 LAB — BASIC METABOLIC PANEL
CO2: 23 mEq/L (ref 19–32)
Calcium: 8.8 mg/dL (ref 8.4–10.5)
Creatinine, Ser: 0.99 mg/dL (ref 0.50–1.10)
GFR calc non Af Amer: 54 mL/min — ABNORMAL LOW (ref >90)
Sodium: 132 mEq/L — ABNORMAL LOW (ref 135–145)

## 2013-03-07 MED ORDER — FUROSEMIDE 10 MG/ML IJ SOLN
40.0000 mg | Freq: Two times a day (BID) | INTRAMUSCULAR | Status: DC
  Administered 2013-03-07 – 2013-03-08 (×3): 40 mg via INTRAVENOUS
  Filled 2013-03-07 (×5): qty 4

## 2013-03-07 MED FILL — Heparin Sodium (Porcine) Inj 1000 Unit/ML: INTRAMUSCULAR | Qty: 20 | Status: AC

## 2013-03-07 MED FILL — Lidocaine HCl IV Inj 20 MG/ML: INTRAVENOUS | Qty: 5 | Status: AC

## 2013-03-07 MED FILL — Sodium Bicarbonate IV Soln 8.4%: INTRAVENOUS | Qty: 50 | Status: AC

## 2013-03-07 MED FILL — Electrolyte-R (PH 7.4) Solution: INTRAVENOUS | Qty: 3000 | Status: AC

## 2013-03-07 MED FILL — Sodium Chloride Irrigation Soln 0.9%: Qty: 3000 | Status: AC

## 2013-03-07 MED FILL — Mannitol IV Soln 20%: INTRAVENOUS | Qty: 500 | Status: AC

## 2013-03-07 NOTE — Progress Notes (Signed)
[facility] BRIEF SICU PROGRESS NOTE  2 Days Post-Op  S/P Procedure(s) (LRB): AORTIC VALVE REPLACEMENT (AVR) (N/A) MAZE (N/A) INTRAOPERATIVE TRANSESOPHAGEAL ECHOCARDIOGRAM (N/A) MITRAL VALVE REPAIR (MVR) (N/A)   Stable day AV pacing w/ stable BP O2 sats 93-96% ON 2 L/min UOP adequate  Plan: Continue current plan  OWEN,CLARENCE H 03/07/2013 7:49 PM

## 2013-03-07 NOTE — Evaluation (Signed)
Physical Therapy Evaluation Patient Details Name: Nicole [name] MRN: [id number] DOB: [birth year] Today's Date: 03/07/2013 Time: 7829-5621 PT Time Calculation (min): 31 min  PT Assessment / Plan / Recommendation History of Present Illness  77 y.o. female admitted to [facility] on 02/28/13 with history of COPD, atrial fibrillation, and diastolic CHF presents with SOB.  [name] on 03/05/13.    Clinical Impression  Pt is POD #2 from open heart surgery and is moving well.  She is anxious to move and this increases her DOE, but with gentle encouragement she does well.  She does not have any help at discharge and would like to go to rehab before returning home alone.  There is a SNF in [place], [place], near where she lives that she would like as her first choice of rehab centers.   PT to follow acutely for deficits listed below.       PT Assessment  Patient needs continued PT services    Follow Up Recommendations  SNF    Does the patient have the potential to tolerate intense rehabilitation     NA  Barriers to Discharge Decreased caregiver support      Equipment Recommendations  Rolling walker with 5" wheels    Recommendations for Other Services   None  Frequency Min 3X/week    Precautions / Restrictions Precautions Precautions: Sternal Precaution Comments: pt reporting "i can't use my arms"   Pertinent Vitals/Pain See vitals flow sheet.      Mobility  Bed Mobility Bed Mobility: Not assessed (pt OOB seated in the recliner chair) Transfers Transfers: Sit to Stand;Stand to Sit Sit to Stand: 4: Min assist;From chair/3-in-1 Stand to Sit: 4: Min assist;To chair/3-in-1 Ambulation/Gait Ambulation/Gait Assistance: 4: Min assist Ambulation Distance (Feet): 50 Feet Assistive device: Other (Comment) (WC) Ambulation/Gait Assistance Details: pt with very slow gait speed.  DOE 3/4, took 3 standing rest breaks because her arms were fatiguing.  Verbal cues to stay closer to [facility] and to  breathe in through her nose.   Gait Pattern: Step-through pattern;Shuffle;Trunk flexed Gait velocity: decreased        PT Diagnosis: Difficulty walking;Abnormality of gait;Generalized weakness;Acute pain  PT Problem List: Decreased strength;Decreased activity tolerance;Decreased balance;Decreased mobility;Decreased knowledge of use of DME;Decreased knowledge of precautions;Pain;Cardiopulmonary status limiting activity PT Treatment Interventions: DME instruction;Gait training;Stair training;Functional mobility training;Therapeutic activities;Therapeutic exercise;Balance training;Neuromuscular re-education;Patient/family education     PT Goals(Current goals can be found in the care plan section) Acute Rehab PT Goals Patient Stated Goal: to get stronger, go to rehab and go home PT Goal Formulation: With patient Time For Goal Achievement: 03/21/13 Potential to Achieve Goals: Good  Visit Information  Last PT Received On: 03/07/13 Assistance Needed: +1 History of Present Illness: 76 y.o. female admitted to [facility] on 02/28/13 with history of COPD, atrial fibrillation, and diastolic CHF presents with SOB.  [name] on 03/05/13.         Prior Functioning  Home Living Family/patient expects to be discharged to:: Private residence Living Arrangements: Alone Type of Home: House Home Access: Stairs to enter Secretary/administrator of Steps: 6 Entrance Stairs-Rails: Right;Left;Can reach both Home Layout: Two level;Bed/bath upstairs Alternate Level Stairs-Number of Steps: 6 Alternate Level Stairs-Rails: Right;Left;Can reach both Home Equipment: Walker - standard;Cane - single point Prior Function Level of Independence: Independent Comments: drives Communication Communication: No difficulties Dominant Hand: Right    Cognition  Cognition Arousal/Alertness: Awake/alert Behavior During Therapy: WFL for tasks assessed/performed Overall Cognitive Status:  Within Functional Limits for  tasks assessed    Extremity/Trunk Assessment Upper Extremity Assessment Upper Extremity Assessment: Generalized weakness Lower Extremity Assessment Lower Extremity Assessment: Generalized weakness Cervical / Trunk Assessment Cervical / Trunk Assessment: Normal   Balance Balance Balance Assessed: Yes Static Standing Balance Static Standing - Balance Support: Bilateral upper extremity supported Static Standing - Level of Assistance: 4: Min assist Dynamic Standing Balance Dynamic Standing - Balance Support: Bilateral upper extremity supported Dynamic Standing - Level of Assistance: 4: Min assist  End of Session PT - End of Session Equipment Utilized During Treatment: Gait belt;Oxygen (2 L O2 [place]) Activity Tolerance: Patient limited by fatigue;Patient limited by pain Patient left: in chair;with call bell/phone within reach Nurse Communication: Mobility status;Patient requests pain meds    Pieper Kasik B. Pati Thinnes, PT, DPT [phone]   03/07/2013, 10:40 AM

## 2013-03-07 NOTE — Progress Notes (Signed)
2 Days Post-Op Procedure(s) (LRB): AORTIC VALVE REPLACEMENT (AVR) (N/A) MAZE (N/A) INTRAOPERATIVE TRANSESOPHAGEAL ECHOCARDIOGRAM (N/A) MITRAL VALVE REPAIR (MVR) (N/A) Subjective: No complaints. Says she did not sleep much.   Walked 50 ft this am with some dyspnea  Objective: Vital signs in last 24 hours: Temp:  [97.8 F (36.6 C)-98.6 F (37 C)] 98.6 F (37 C) (10/08 0404) Pulse Rate:  [77-81] 79 (10/08 0700) Cardiac Rhythm:  [-] A-V Sequential paced (10/08 0700) Resp:  [10-24] 19 (10/08 0700) BP: (91-128)/(40-99) 113/49 mmHg (10/08 0700) SpO2:  [93 %-100 %] 97 % (10/08 0748) Arterial Line BP: (69-103)/(49-67) 69/58 mmHg (10/07 1015) Weight:  [92.3 kg (203 lb 7.8 oz)] 92.3 kg (203 lb 7.8 oz) (10/08 0700)  Hemodynamic parameters for last 24 hours: PAP: (42-50)/(21-30) 50/26 mmHg  Intake/Output from previous day: 10/07 0701 - 10/08 0700 In: 711.1 [I.V.:611.1; IV Piggyback:100] Out: 905 [Urine:875; Chest Tube:30] Intake/Output this shift:    General appearance: alert and cooperative Neurologic: intact Heart: regular rate and rhythm, S1, S2 normal, no murmur, click, rub or gallop Lungs: diminished breath sounds bibasilar Abdomen: soft, non-tender; bowel sounds normal; no masses,  no organomegaly Extremities: edema mild Wound: incisions ok  Lab Results:  Recent Labs  03/06/13 1622 03/06/13 1624 03/07/13 0455  WBC 18.6*  --  17.0*  HGB 9.7* 10.9* 9.3*  HCT 29.0* 32.0* 27.8*  PLT 133*  --  133*   BMET:  Recent Labs  03/06/13 0315  03/06/13 1624 03/07/13 0455  NA 135  --  136 132*  K 4.9  --  4.9 5.0  CL 105  --  104 99  CO2 23  --   --  23  GLUCOSE 203*  --  117* 130*  BUN 13  --  18 22  CREATININE 0.74  < > 1.10 0.99  CALCIUM 7.8*  --   --  8.8  < > = values in this interval not displayed.  PT/INR:  Recent Labs  03/05/13 1400  LABPROT 18.7*  INR 1.61*   ABG    Component Value Date/Time   PHART 7.302* 03/05/2013 2111   HCO3 23.8 03/05/2013 2111   TCO2 23 03/06/2013 1624   ACIDBASEDEF 3.0* 03/05/2013 2111   O2SAT 94.0 03/05/2013 2111   CBG (last 3)   Recent Labs  03/06/13 1942 03/06/13 2320 03/07/13 0403  GLUCAP 130* 125* 146*    Assessment/Plan: S/P Procedure(s) (LRB): AORTIC VALVE REPLACEMENT (AVR) (N/A) MAZE (N/A) INTRAOPERATIVE TRANSESOPHAGEAL ECHOCARDIOGRAM (N/A) MITRAL VALVE REPAIR (MVR) (N/A) She is hemodynamically stable on dop 2 mcg. Will wean today. Rhythm on ECG appears to be junctional 56. She will not conduct A-pacing. LBBB that is new postop. Will continue AV pacing for now. Mobilize, PT consult. She is elderly, somewhat debilitated with COPD and is going to be slow to mobilize. Hold off on coumadin until we see what rhythm is going to do in case she needs PPM. Diuresis Diabetes control Continue foley due to patient in ICU and urinary output monitoring Expected acute blood loss anemia: stable.   LOS: 7 days    BARTLE,BRYAN K 03/07/2013

## 2013-03-08 LAB — TYPE AND SCREEN
ABO/RH(D): A POS
Unit division: 0
Unit division: 0
Unit division: 0
Unit division: 0

## 2013-03-08 LAB — BASIC METABOLIC PANEL
BUN: 27 mg/dL — ABNORMAL HIGH (ref 6–23)
CO2: 25 mEq/L (ref 19–32)
Chloride: 100 mEq/L (ref 96–112)
Creatinine, Ser: 0.96 mg/dL (ref 0.50–1.10)
GFR calc Af Amer: 65 mL/min — ABNORMAL LOW (ref >90)
GFR calc non Af Amer: 56 mL/min — ABNORMAL LOW (ref >90)
Potassium: 4.6 mEq/L (ref 3.5–5.1)
Sodium: 133 mEq/L — ABNORMAL LOW (ref 135–145)

## 2013-03-08 LAB — GLUCOSE, CAPILLARY
Glucose-Capillary: 103 mg/dL — ABNORMAL HIGH (ref 70–99)
Glucose-Capillary: 109 mg/dL — ABNORMAL HIGH (ref 70–99)
Glucose-Capillary: 125 mg/dL — ABNORMAL HIGH (ref 70–99)

## 2013-03-08 MED ORDER — FAMOTIDINE 20 MG PO TABS
20.0000 mg | ORAL_TABLET | Freq: Two times a day (BID) | ORAL | Status: DC
  Administered 2013-03-08 – 2013-03-16 (×16): 20 mg via ORAL
  Filled 2013-03-08 (×18): qty 1

## 2013-03-08 MED ORDER — ONDANSETRON HCL 4 MG/2ML IJ SOLN: 4.0000 mg | Freq: Four times a day (QID) | INTRAMUSCULAR | Status: DC | PRN

## 2013-03-08 MED ORDER — ALPRAZOLAM 0.25 MG PO TABS
0.2500 mg | ORAL_TABLET | Freq: Every evening | ORAL | Status: DC | PRN
  Administered 2013-03-08 – 2013-03-09 (×2): 0.25 mg via ORAL
  Filled 2013-03-08 (×2): qty 1

## 2013-03-08 MED ORDER — ONDANSETRON HCL 4 MG PO TABS: 4.0000 mg | ORAL_TABLET | Freq: Four times a day (QID) | ORAL | Status: DC | PRN

## 2013-03-08 MED ORDER — DOCUSATE SODIUM 100 MG PO CAPS
200.0000 mg | ORAL_CAPSULE | Freq: Every day | ORAL | Status: DC
  Administered 2013-03-09 – 2013-03-15 (×7): 200 mg via ORAL
  Filled 2013-03-08 (×9): qty 2

## 2013-03-08 MED ORDER — SODIUM CHLORIDE 0.9 % IJ SOLN: 3.0000 mL | INTRAMUSCULAR | Status: DC | PRN

## 2013-03-08 MED ORDER — SODIUM CHLORIDE 0.9 % IJ SOLN
3.0000 mL | Freq: Two times a day (BID) | INTRAMUSCULAR | Status: DC
  Administered 2013-03-08 – 2013-03-15 (×15): 3 mL via INTRAVENOUS

## 2013-03-08 MED ORDER — BISACODYL 5 MG PO TBEC: 10.0000 mg | DELAYED_RELEASE_TABLET | Freq: Every day | ORAL | Status: DC | PRN

## 2013-03-08 MED ORDER — ACETAMINOPHEN 325 MG PO TABS: 650.0000 mg | ORAL_TABLET | Freq: Four times a day (QID) | ORAL | Status: DC | PRN

## 2013-03-08 MED ORDER — BISACODYL 10 MG RE SUPP: 10.0000 mg | Freq: Every day | RECTAL | Status: DC | PRN

## 2013-03-08 MED ORDER — FUROSEMIDE 40 MG PO TABS
40.0000 mg | ORAL_TABLET | Freq: Every day | ORAL | Status: DC
  Filled 2013-03-08: qty 1

## 2013-03-08 MED ORDER — OXYCODONE HCL 5 MG PO TABS
5.0000 mg | ORAL_TABLET | ORAL | Status: DC | PRN
  Administered 2013-03-09 – 2013-03-10 (×4): 5 mg via ORAL
  Administered 2013-03-10: 10 mg via ORAL
  Administered 2013-03-11: 5 mg via ORAL
  Administered 2013-03-11 – 2013-03-15 (×14): 10 mg via ORAL
  Filled 2013-03-08: qty 2
  Filled 2013-03-08: qty 1
  Filled 2013-03-08 (×3): qty 2
  Filled 2013-03-08: qty 1
  Filled 2013-03-08 (×4): qty 2
  Filled 2013-03-08: qty 1
  Filled 2013-03-08 (×3): qty 2
  Filled 2013-03-08: qty 1
  Filled 2013-03-08 (×3): qty 2
  Filled 2013-03-08: qty 1
  Filled 2013-03-08: qty 2

## 2013-03-08 MED ORDER — SODIUM CHLORIDE 0.9 % IV SOLN: 250.0000 mL | INTRAVENOUS | Status: DC | PRN

## 2013-03-08 MED ORDER — MOVING RIGHT ALONG BOOK
Freq: Once | Status: AC
  Administered 2013-03-08: 20:00:00
  Filled 2013-03-08 (×2): qty 1

## 2013-03-08 MED ORDER — TRAMADOL HCL 50 MG PO TABS: 50.0000 mg | ORAL_TABLET | ORAL | Status: DC | PRN

## 2013-03-08 MED ORDER — ASPIRIN EC 325 MG PO TBEC
325.0000 mg | DELAYED_RELEASE_TABLET | Freq: Every day | ORAL | Status: DC
  Administered 2013-03-09: 325 mg via ORAL
  Filled 2013-03-08: qty 1

## 2013-03-08 MED ORDER — POTASSIUM CHLORIDE CRYS ER 20 MEQ PO TBCR
20.0000 meq | EXTENDED_RELEASE_TABLET | Freq: Every day | ORAL | Status: DC
  Filled 2013-03-08: qty 1

## 2013-03-08 NOTE — Significant Event (Signed)
Transferred to 2W22 via wheelchair with two person assist in and out of wheelchair. Report given to receiving RN prior to coming to the unit. Receiving RN at bedside. No untoward event happened during transport.

## 2013-03-08 NOTE — Progress Notes (Signed)
3 Days Post-Op Procedure(s) (LRB): AORTIC VALVE REPLACEMENT (AVR) (N/A) MAZE (N/A) INTRAOPERATIVE TRANSESOPHAGEAL ECHOCARDIOGRAM (N/A) MITRAL VALVE REPAIR (MVR) (N/A) Subjective:  Feels better today since she slept Still sore  Objective: Vital signs in last 24 hours: Temp:  [97.9 F (36.6 C)-98.8 F (37.1 C)] 98.5 F (36.9 C) (10/09 0721) Pulse Rate:  [74-83] 80 (10/09 0800) Cardiac Rhythm:  [-] A-V Sequential paced (10/09 0800) Resp:  [11-19] 19 (10/09 0800) BP: (92-118)/(41-80) 103/50 mmHg (10/09 0800) SpO2:  [90 %-100 %] 98 % (10/09 0800) Weight:  [91.9 kg (202 lb 9.6 oz)] 91.9 kg (202 lb 9.6 oz) (10/09 0500)  Hemodynamic parameters for last 24 hours:    Intake/Output from previous day: 10/08 0701 - 10/09 0700 In: 1208.2 [P.O.:960; I.V.:248.2] Out: 1490 [Urine:1490] Intake/Output this shift: Total I/O In: -  Out: 20 [Urine:20]  General appearance: alert and cooperative Neurologic: intact Heart: regular rate and rhythm, S1, S2 normal, no murmur, click, rub or gallop Lungs: diminished breath sounds bibasilar Extremities: edema mild Wound: dressing dry  Lab Results:  Recent Labs  03/06/13 1622 03/06/13 1624 03/07/13 0455  WBC 18.6*  --  17.0*  HGB 9.7* 10.9* 9.3*  HCT 29.0* 32.0* 27.8*  PLT 133*  --  133*   BMET:  Recent Labs  03/07/13 0455 03/08/13 0400  NA 132* 133*  K 5.0 4.6  CL 99 100  CO2 23 25  GLUCOSE 130* 111*  BUN 22 27*  CREATININE 0.99 0.96  CALCIUM 8.8 8.4    PT/INR:  Recent Labs  03/05/13 1400  LABPROT 18.7*  INR 1.61*   ABG    Component Value Date/Time   PHART 7.302* 03/05/2013 2111   HCO3 23.8 03/05/2013 2111   TCO2 23 03/06/2013 1624   ACIDBASEDEF 3.0* 03/05/2013 2111   O2SAT 94.0 03/05/2013 2111   CBG (last 3)   Recent Labs  03/07/13 2245 03/08/13 0359 03/08/13 0720  GLUCAP 136* 103* 103*    Assessment/Plan: S/P Procedure(s) (LRB): AORTIC VALVE REPLACEMENT (AVR) (N/A) MAZE (N/A) INTRAOPERATIVE  TRANSESOPHAGEAL ECHOCARDIOGRAM (N/A) MITRAL VALVE REPAIR (MVR) (N/A) She is hemodynamically stable off vasopressors. Rhythm still looks junctional under pacer with rate 56-58. Will continue AV pacing and observe. She will not A-pace.  Hold off on coumadin in case she needs PPM. Mobilize, continue PT Diuresis Plan for transfer to step-down: see transfer orders   LOS: 8 days    Johaan Ryser K 03/08/2013

## 2013-03-09 ENCOUNTER — Encounter (HOSPITAL_COMMUNITY): Payer: Self-pay | Admitting: Surgery

## 2013-03-09 LAB — BASIC METABOLIC PANEL
Calcium: 8.6 mg/dL (ref 8.4–10.5)
Creatinine, Ser: 0.82 mg/dL (ref 0.50–1.10)
GFR calc Af Amer: 79 mL/min — ABNORMAL LOW (ref >90)
Sodium: 136 mEq/L (ref 135–145)

## 2013-03-09 LAB — CBC
HCT: 25.2 % — ABNORMAL LOW (ref 36.0–46.0)
MCH: 29.5 pg (ref 26.0–34.0)
MCHC: 33.3 g/dL (ref 30.0–36.0)
MCV: 88.4 fL (ref 78.0–100.0)
Platelets: 158 10*3/uL (ref 150–400)
RDW: 15.2 % (ref 11.5–15.5)
WBC: 10.4 10*3/uL (ref 4.0–10.5)

## 2013-03-09 MED ORDER — LACTULOSE 10 GM/15ML PO SOLN
20.0000 g | Freq: Once | ORAL | Status: AC
  Administered 2013-03-09: 20 g via ORAL
  Filled 2013-03-09: qty 30

## 2013-03-09 MED ORDER — FUROSEMIDE 10 MG/ML IJ SOLN
40.0000 mg | Freq: Two times a day (BID) | INTRAMUSCULAR | Status: DC
  Administered 2013-03-09 – 2013-03-13 (×9): 40 mg via INTRAVENOUS
  Filled 2013-03-09 (×14): qty 4

## 2013-03-09 MED ORDER — WARFARIN - PHYSICIAN DOSING INPATIENT: Freq: Every day | Status: DC

## 2013-03-09 MED ORDER — ALBUTEROL SULFATE HFA 108 (90 BASE) MCG/ACT IN AERS
2.0000 | INHALATION_SPRAY | RESPIRATORY_TRACT | Status: DC | PRN
  Filled 2013-03-09: qty 6.7

## 2013-03-09 MED ORDER — WARFARIN SODIUM 5 MG PO TABS
5.0000 mg | ORAL_TABLET | Freq: Once | ORAL | Status: AC
  Administered 2013-03-09: 5 mg via ORAL
  Filled 2013-03-09: qty 1

## 2013-03-09 NOTE — Progress Notes (Signed)
Clinical Social Work Department BRIEF PSYCHOSOCIAL ASSESSMENT 03/09/2013  Patient:  Nicole [name],Nicole [name]     Account Number:  [id number]     Admit date:  02/28/2013  Clinical Social Worker:  Carren Rang  Date/Time:  03/09/2013 02:24 PM  Referred by:  Care Management  Date Referred:  03/09/2013 Referred for  SNF Placement   Other Referral:   Interview type:  Other - See comment Other interview type:   CSW spoke with patient and daughter by bedside    PSYCHOSOCIAL DATA Living Status:  ALONE Admitted from facility:   Level of care:   Primary support name:  Verita Lamb [phone] Primary support relationship to patient:  CHILD, ADULT Degree of support available:   Very Supportive    CURRENT CONCERNS Current Concerns  Post-Acute Placement   Other Concerns:    SOCIAL WORK ASSESSMENT / PLAN CSW received consult for SNF placement at dc. CSW went into room and introduced self to patient and daughter at bedside. Daughter stated she is a Engineer, civil (consulting) here, but her and patient live in [place], [place]. Patient and daughter both want a SNF in [place], [place] specifically [facility]. Patient states she has been there before and loved it. Daughter stated she already spoke to the facility and the facility was okay with her coming back. CSW encouraged family to think of other options if this facility were not to have a bed. Patient and daughter agreed. CSW will complete FL2 for MD signature and will update further   Assessment/plan status:  Psychosocial Support/Ongoing Assessment of Needs Other assessment/ plan:   Information/referral to community resources:   SNF info/ CSW Contact information    PATIENT'S/FAMILY'S RESPONSE TO PLAN OF CARE: Patient agreeable to SNF- in [facility]       [place], MSW, [place] [phone]

## 2013-03-09 NOTE — Progress Notes (Addendum)
      [address]       [place] [id number]             [phone]        4 Days Post-Op Procedure(s) (LRB): AORTIC VALVE REPLACEMENT (AVR) (N/A) MAZE (N/A) INTRAOPERATIVE TRANSESOPHAGEAL ECHOCARDIOGRAM (N/A) MITRAL VALVE REPAIR (MVR) (N/A)  Subjective: Patient not feel all that well this morning. She complains of incisional pain mostly. Passing flatus but no bowel movement yet.  Objective: Vital signs in last 24 hours: Temp:  [98 F (36.7 C)-98.6 F (37 C)] 98.1 F (36.7 C) (10/10 0522) Pulse Rate:  [79-80] 80 (10/10 0522) Cardiac Rhythm:  [-] A-V Sequential paced (10/09 1930) Resp:  [16-21] 18 (10/10 0522) BP: (96-118)/(48-77) 104/58 mmHg (10/10 0522) SpO2:  [91 %-100 %] 99 % (10/10 0522) Weight:  [92.9 kg (204 lb 12.9 oz)] 92.9 kg (204 lb 12.9 oz) (10/10 0522)  Pre op weight  87  kg Current Weight  03/09/13 92.9 kg (204 lb 12.9 oz)      Intake/Output from previous day: 10/09 0701 - 10/10 0700 In: 290 [P.O.:290] Out: 640 [Urine:640]   Physical Exam:  Cardiovascular: RRR, no murmurs, gallops, or rubs. Pulmonary: Diminished at bases bilaterally; no rales, wheezes, or rhonchi. Abdomen: Soft, non tender, bowel sounds present. Extremities: Mild bilateral lower extremity edema. Wounds: Dressing is clean and dry.    Lab Results: CBC: Recent Labs  03/07/13 0455 03/09/13 0530  WBC 17.0* 10.4  HGB 9.3* 8.4*  HCT 27.8* 25.2*  PLT 133* 158   BMET:  Recent Labs  03/08/13 0400 03/09/13 0530  NA 133* 136  K 4.6 4.9  CL 100 100  CO2 25 28  GLUCOSE 111* 110*  BUN 27* 24*  CREATININE 0.96 0.82  CALCIUM 8.4 8.6    PT/INR:  Lab Results  Component Value Date   INR 1.61* 03/05/2013   INR 1.31 03/02/2013   ABG:  INR: Will add last result for INR, ABG once components are confirmed Will add last 4 CBG results once components are confirmed  Assessment/Plan:  1. CV - AV paced. Underlying junctional previously. Coumadin not started yet  as monitoring recovery of conduction system (may need PPM). 2.  Pulmonary - On 2 liters of oxygen via [place]-wean as tolerates.Encourage incentive spirometer 3. Volume Overload - Continue Lasix 40 daily 4.  Acute blood loss anemia - Last H and H 8.4 and 25.2 5.Continue with CRPI  ZIMMERMAN,DONIELLE MPA-C 03/09/2013,7:42 AM   Chart reviewed, patient examined, agree with above. She was short of breath overnight and this am and uncomfortable. She received some pain medicine and IV lasix this am and has diuresed well. She feels much better. She has fixed pulmonary HTN with PAD in the 30's so she needs to get diuresed back to baseline weight.  Rhythm this afternoon looks like A-fib with rate in 80's with narrower complexes. I put pacer on VVI back up at 50. She will need coumadin for three months with mitral ring and maze. Will start that now since rhythm is improving. I don't think it is worth putting her back on amio or BB now with HR in 50's yesterday.

## 2013-03-09 NOTE — Progress Notes (Signed)
03/09/2013 6:27 PM Nursing note Upon attempted administration of IV lasix for 1800, both IV sites leaking. Unable to administer dose. IV site d/c per protocol. IV team notified for restart. Times adjusted to 2000 for administration after IV site obtained. Pt. Updated on plan.  Renold Kozar, Blanchard Kelch

## 2013-03-09 NOTE — Progress Notes (Signed)
Physical Therapy Treatment Patient Details Name: Nicole [name] MRN: [id number] DOB: 1935-12-23 Today's Date: 03/09/2013 Time: 4098-1191 PT Time Calculation (min): 26 min  PT Assessment / Plan / Recommendation  History of Present Illness 77 y.o. female admitted to [facility] on 02/28/13 with history of COPD, atrial fibrillation, and diastolic CHF presents with SOB.  [name] on 03/05/13.     PT Comments   Pt demonstrating progress as she ambulated with PT and cardiac rehab today. Pt continues to be limited by fatigue and would continue to benefit from skilled PT to improve functional mobility and safety.  Follow Up Recommendations  SNF;Supervision/Assistance - 24 hour     Does the patient have the potential to tolerate intense rehabilitation     Barriers to Discharge        Equipment Recommendations  Rolling walker with 5" wheels    Recommendations for Other Services    Frequency Min 3X/week   Progress towards PT Goals Progress towards PT goals: Progressing toward goals  Plan Current plan remains appropriate    Precautions / Restrictions Precautions Precautions: Sternal;Fall Restrictions Weight Bearing Restrictions: No   Pertinent Vitals/Pain No c/o during pain, pt reports taking pain meds prior to PT session. SaO2 at rest on 2L of O2 [place]: 98% and no c/o SOB/dizziness. SaO2 after ambulation on 2L of O2 [place]: 98-100% and no c/o SOB/dizziness.    Mobility  Bed Mobility Bed Mobility: Not assessed Details for Bed Mobility Assistance: pt sitting in chair upon arrival. Transfers Transfers: Sit to Stand;Stand to Sit Sit to Stand: 4: Min assist;From chair/3-in-1 Stand to Sit: 4: Min assist;To chair/3-in-1 Details for Transfer Assistance: Min A to ensure pt adheres to sternal precautions and due to weakness. VC's to adhere to precautions and for technique. Pt performed sit<>stand from chair and BSC.  Ambulation/Gait Ambulation/Gait Assistance: 4: Min assist Ambulation Distance  (Feet): 125 Feet Assistive device: Rolling walker Ambulation/Gait Assistance Details: Min A to ensure pt clears RW around obstacles. VC's to stay within RW and for upright posture. Frequent standing rest breaks due to fatigue.  Gait Pattern: Step-through pattern;Trunk flexed;Decreased dorsiflexion - right;Decreased dorsiflexion - left;Decreased stride length Gait velocity: decreased General Gait Details: Pt ambulated on 2L of O2 [place] with no c/o of SOB/dizziness, just fatigue. Stairs: No    Exercises     PT Diagnosis:    PT Problem List:   PT Treatment Interventions:     PT Goals (current goals can now be found in the care plan section)    Visit Information  Last PT Received On: 03/09/13 Assistance Needed: +1 History of Present Illness: 77 y.o. female admitted to [facility] on 02/28/13 with history of COPD, atrial fibrillation, and diastolic CHF presents with SOB.  [name] on 03/05/13.      Subjective Data      Cognition  Cognition Arousal/Alertness: Awake/alert Behavior During Therapy: WFL for tasks assessed/performed Overall Cognitive Status: Within Functional Limits for tasks assessed    Balance  Static Standing Balance Static Standing - Balance Support: No upper extremity supported;During functional activity Static Standing - Level of Assistance: 4: Min assist Static Standing - Comment/# of Minutes: Pt able to stand for 1 min. and perform hygiene at [facility] with min assist to maintain balance.  End of Session PT - End of Session Equipment Utilized During Treatment: Oxygen Activity Tolerance: Patient limited by fatigue Patient left: in chair;with call bell/phone within reach   GP  Sol Blazing 03/09/2013, 2:59 PM

## 2013-03-09 NOTE — Progress Notes (Signed)
CARDIAC REHAB PHASE I   PRE:  Rate/Rhythm: 80 pacing   BP:  Supine: 116/69  Sitting:   Standing:    SaO2: 94% 2L  MODE:  Ambulation: 150 ft   POST:  Rate/Rhythm: 96 Pacing  BP:  Supine:   Sitting: 122/60  Standing:    SaO2: 98 2L 1110-1200  Patient was reluctant to walk, but convinced easily.  She did not have a good night.  Tolerated well with assistance x 2, a rolling walker, and 2L O2.  She was fairly independent, encouraged to walk with assistance a total of 3 times daily.  Reinforced IS.  To recliner upon leaving.  Cindra Eves RN, BSN 03/09/2013 7:11 AM

## 2013-03-09 NOTE — Progress Notes (Signed)
I have reviewed this note and agree with all findings. Kati Chrisanna Mishra, PT, DPT Pager: [phone]   

## 2013-03-09 NOTE — Progress Notes (Signed)
Clinical Social Work Department CLINICAL SOCIAL WORK PLACEMENT NOTE 03/09/2013  Patient:  Nicole [name],Nicole [name]  Account Number:  [id number] Admit date:  02/28/2013  Clinical Social Worker:  Carren Rang  Date/time:  03/09/2013 02:29 PM  Clinical Social Work is seeking post-discharge placement for this patient at the following level of care:   SKILLED NURSING   (*CSW will update this form in Epic as items are completed)   03/09/2013  Patient/family provided with [facility] Department of Clinical Social Work's list of facilities offering this level of care within the geographic area requested by the patient (or if unable, by the patient's family).  03/09/2013  Patient/family informed of their freedom to choose among providers that offer the needed level of care, that participate in Medicare, Medicaid or managed care program needed by the patient, have an available bed and are willing to accept the patient.  03/09/2013  Patient/family informed of [facility]' ownership interest in [facility], as well as of the fact that they are under no obligation to receive care at this facility.  PASARR submitted to EDS on 03/08/2013 PASARR number received from EDS on 03/08/2013  FL2 transmitted to all facilities in geographic area requested by pt/family on  03/08/2013 FL2 transmitted to all facilities within larger geographic area on   Patient informed that his/her managed care company has contracts with or will negotiate with  certain facilities, including the following:     Patient/family informed of bed offers received:   Patient chooses bed at  Physician recommends and patient chooses bed at    Patient to be transferred to  on   Patient to be transferred to facility by   The following physician request were entered in Epic:   Additional Comments:  Maree Krabbe, MSW, [employer] [phone]

## 2013-03-09 NOTE — Progress Notes (Signed)
Patient ID: Nicole [name], female   DOB: 1935-06-23, 77 y.o.   MRN: [id number]     SUBJECTIVE: Chest sore this morning.  Some dyspnea.  Still AV pacing.   Marland Kitchen albuterol  2.5 mg Nebulization TID   And  . ipratropium  0.5 mg Nebulization TID  . aspirin EC  325 mg Oral Daily  . atorvastatin  40 mg Oral QPM  . docusate sodium  200 mg Oral Daily  . enoxaparin (LOVENOX) injection  40 mg Subcutaneous QHS  . famotidine  20 mg Oral BID  . furosemide  40 mg Intravenous BID  . lactulose  20 g Oral Once  . multivitamin with minerals  1 tablet Oral Daily  . sodium chloride  3 mL Intravenous Q12H      Filed Vitals:   03/08/13 1544 03/08/13 1952 03/08/13 2015 03/09/13 0522  BP: 118/61  116/77 104/58  Pulse: 80  80 80  Temp: 98.1 F (36.7 C)  98 F (36.7 C) 98.1 F (36.7 C)  TempSrc: Oral  Oral Oral  Resp: 18  21 18   Height:      Weight:    204 lb 12.9 oz (92.9 kg)  SpO2: 99% 99% 99% 99%    Intake/Output Summary (Last 24 hours) at 03/09/13 0913 Last data filed at 03/08/13 1400  Gross per 24 hour  Intake     50 ml  Output    370 ml  Net   -320 ml    LABS: Basic Metabolic Panel:  Recent Labs  40/98/11 1622  03/08/13 0400 03/09/13 0530  NA  --   < > 133* 136  K  --   < > 4.6 4.9  CL  --   < > 100 100  CO2  --   < > 25 28  GLUCOSE  --   < > 111* 110*  BUN  --   < > 27* 24*  CREATININE 0.98  < > 0.96 0.82  CALCIUM  --   < > 8.4 8.6  MG 3.0*  --   --   --   < > = values in this interval not displayed. Liver Function Tests: No results found for this basename: AST, ALT, ALKPHOS, BILITOT, PROT, ALBUMIN,  in the last 72 hours No results found for this basename: LIPASE, AMYLASE,  in the last 72 hours CBC:  Recent Labs  03/07/13 0455 03/09/13 0530  WBC 17.0* 10.4  HGB 9.3* 8.4*  HCT 27.8* 25.2*  MCV 88.3 88.4  PLT 133* 158   Cardiac Enzymes: No results found for this basename: CKTOTAL, CKMB, CKMBINDEX, TROPONINI,  in the last 72 hours BNP: No components found with this  basename: POCBNP,  D-Dimer: No results found for this basename: DDIMER,  in the last 72 hours Hemoglobin A1C: No results found for this basename: HGBA1C,  in the last 72 hours Fasting Lipid Panel: No results found for this basename: CHOL, HDL, LDLCALC, TRIG, CHOLHDL, LDLDIRECT,  in the last 72 hours Thyroid Function Tests: No results found for this basename: TSH, T4TOTAL, FREET3, T3FREE, THYROIDAB,  in the last 72 hours Anemia Panel: No results found for this basename: VITAMINB12, FOLATE, FERRITIN, TIBC, IRON, RETICCTPCT,  in the last 72 hours  RADIOLOGY: Dg Chest 2 View  02/28/2013   CLINICAL DATA:  Shortness of breath, atrial fibrillation and rapid ventricular rate.  EXAM: CHEST - 2 VIEW  COMPARISON:  None  FINDINGS: The heart size and mediastinal contours are within normal limits. There is  no evidence of pulmonary edema, consolidation, pneumothorax, nodule or pleural fluid. Degenerative changes are present in the spine.  IMPRESSION: No active disease.   Electronically Signed   By: Irish Lack   On: 02/28/2013 16:27   Dg Chest Port 1 View  03/07/2013   CLINICAL DATA:  Post op cardiac surgery.  EXAM: PORTABLE CHEST - 1 VIEW  COMPARISON:  Multiple priors.  FINDINGS: Theone Murdoch catheter has been removed, with the right IJ vascular sheath remaining. Mediastinal/ thoracic drains have been removed. There are bibasilar opacities. Mild pulmonary vascular congestion. Small bilateral pleural effusions. No pneumothorax.  IMPRESSION: 1. Pulmonary vascular congestion. 2. Bibasilar opacities, likely representing atelectasis with underlying pleural fluid.   Electronically Signed   By: Jerene Dilling M.D.   On: 03/07/2013 08:27   Dg Chest Portable 1 View In Am  03/06/2013   CLINICAL DATA:  CORONARY ARTERIES disease. Recent CABG. Chest pain.  EXAM: PORTABLE CHEST - 1 VIEW  IMPRESSION: Tiny bilateral effusions. Normal vascularity. Edema in the upper lobes has resolved.  COMPARISON: 03/05/2013:  COMPARISON: 03/05/2013  FINDINGS:Endotracheal tube and NG tube been removed. Two chest tubes and Swan-Ganz catheter remain in place. No pneumothorax. Tiny bilateral effusions. Vascularity is normal.   Electronically Signed   By: Geanie Cooley M.D.   On: 03/06/2013 07:45   Dg Chest Portable 1 View  03/05/2013   CLINICAL DATA:  Status post aortic valvuloplasty  EXAM: PORTABLE CHEST - 1 VIEW  COMPARISON:  02/28/2013  FINDINGS: Endotracheal tube is appropriately positioned, as is the NG tube. Mediastinal drains are in place. Median sternotomy wires noted. Right IJ Swan-Ganz catheter tip terminates over the main pulmonary artery. Moderate enlargement of the cardiomediastinal silhouette is noted with central vascular congestion and cephalization of vessels which may indicate early edema/fluid overload. No pleural effusion.  IMPRESSION: Cardiomegaly with probable early interstitial edema/ volume overload.   Electronically Signed   By: Christiana Pellant M.D.   On: 03/05/2013 15:05    PHYSICAL EXAM General: Mild tachypnea Neck: JVP 10 cm, no thyromegaly or thyroid nodule.  Lungs: Dependent crackles CV: Nondisplaced PMI.  Heart regular S1/S2, no S3/S4, 2/6 SEM.  Trace ankle edema.  No carotid bruit.  Normal pedal pulses.  Abdomen: Soft, nontender, no hepatosplenomegaly, no distention.  Neurologic: Alert and oriented x 3.  Psych: Normal affect. Extremities: No clubbing or cyanosis.   TELEMETRY: Reviewed telemetry pt in AV sequential pacing, atrial lead may not be capturing  ASSESSMENT AND PLAN: 77 yo with history of atrial fibrillation/RVR, diastolic CHF, and severe AS now s/p bioprosthetic AVR and MAZE. with history of atrial fibrillation/RVR, diastolic CHF, and severe AS now s/p bioprosthetic AVR and MAZE.  1. Acute on chronic diastolic CHF: Patient is volume overloaded and somewhat short of breath.  Stop po Lasix and use Lasix 40 mg IV bid.  K upper normal today so will not supplement.  2. Bioprosthetic AVR: Stable post-op. 3. Atrial fibrillation: s/p MAZE.  She is AV paced, does not appear to be capturing  atrium.  May end up needing PCM.  Coumadin not started yet because of this.  4. Mobilize per surgery service.   [name] 03/09/2013 9:17 AM

## 2013-03-10 LAB — BASIC METABOLIC PANEL
BUN: 20 mg/dL (ref 6–23)
Calcium: 8.6 mg/dL (ref 8.4–10.5)
Creatinine, Ser: 0.82 mg/dL (ref 0.50–1.10)
GFR calc non Af Amer: 68 mL/min — ABNORMAL LOW (ref >90)
Glucose, Bld: 122 mg/dL — ABNORMAL HIGH (ref 70–99)

## 2013-03-10 LAB — PROTIME-INR: Prothrombin Time: 15.7 seconds — ABNORMAL HIGH (ref 11.6–15.2)

## 2013-03-10 MED ORDER — WARFARIN SODIUM 5 MG PO TABS
5.0000 mg | ORAL_TABLET | Freq: Once | ORAL | Status: AC
  Administered 2013-03-10: 5 mg via ORAL
  Filled 2013-03-10: qty 1

## 2013-03-10 NOTE — Progress Notes (Addendum)
       [address]       [place] [address]             [phone]          5 Days Post-Op Procedure(s) (LRB): AORTIC VALVE REPLACEMENT (AVR) (N/A) MAZE (N/A) INTRAOPERATIVE TRANSESOPHAGEAL ECHOCARDIOGRAM (N/A) MITRAL VALVE REPAIR (MVR) (N/A)  Subjective: "I feel a whole lot better today!"  Back in SR.  Breathing stable. Only complaint is constipation.   Objective: Vital signs in last 24 hours: Patient Vitals for the past 24 hrs:  BP Temp Temp src Pulse Resp SpO2 Weight  03/10/13 0851 - - - - - 98 % -  03/10/13 0456 109/66 mmHg 98.1 F (36.7 C) Oral 76 19 97 % 199 lb 1.2 oz (90.3 kg)  03/09/13 2056 - - - - - 98 % -  03/09/13 2010 112/50 mmHg 98.9 F (37.2 C) Oral 79 20 96 % -  03/09/13 1730 106/82 mmHg 98.3 F (36.8 C) Oral 83 20 99 % -  03/09/13 1411 - - - - - 95 % -  03/09/13 1006 - - - - - 100 % -  03/09/13 0930 103/66 mmHg - - 80 - - -   Current Weight  03/10/13 199 lb 1.2 oz (90.3 kg)  PRE OP WEIGHT: 87 kg    Intake/Output from previous day: 10/10 0701 - 10/11 0700 In: 480 [P.O.:480] Out: 2150 [Urine:2150]  CBGs 116-122  PHYSICAL EXAM:  Heart: RRR Lungs: Clear Wound: Clean and dry Extremities: Mild LE edema    Lab Results: CBC: Recent Labs  03/09/13 0530  WBC 10.4  HGB 8.4*  HCT 25.2*  PLT 158   BMET:  Recent Labs  03/09/13 0530 03/10/13 0630  NA 136 135  K 4.9 4.1  CL 100 98  CO2 28 32  GLUCOSE 110* 122*  BUN 24* 20  CREATININE 0.82 0.82  CALCIUM 8.6 8.6    PT/INR:  Recent Labs  03/10/13 0630  LABPROT 15.7*  INR 1.28      Assessment/Plan: S/P Procedure(s) (LRB): AORTIC VALVE REPLACEMENT (AVR) (N/A) MAZE (N/A) INTRAOPERATIVE TRANSESOPHAGEAL ECHOCARDIOGRAM (N/A) MITRAL VALVE REPAIR (MVR) (N/A) CV- AF, now in SR with frequent PVCs.  Pacer on backup 50, but not pacing.  Rates generally 80s.  Will disconnect pacer, roll and tape wires and watch. Not on bb, continue Coumadin. Vol overload/CHF-  Continue IV Lasix. Wt trending down and UOP excellent. CRPI, pulm toilet. LOC today.   LOS: 10 days    [name],Nicole H 03/10/2013  I have seen and examined Nicole [name] and agree with the above assessment  and plan.  Nicole Ovens MD Beeper [phone] Office [phone] 03/10/2013 6:01 PM

## 2013-03-10 NOTE — Progress Notes (Addendum)
CARDIAC REHAB PHASE I   PRE:  Rate/Rhythm: 79 sinus rhythm  BP:  Supine:   Sitting: 119/51  Standing:    SaO2: 100% 2L  MODE:  Ambulation: 250 ft   POST:  Rate/Rhythem: 89 sinus rhythm  BP:  Supine:   Sitting: 134/60  Standing:    SaO2: 95% 2L  1015-1105 Pt ambulated in hallway using RW x1 assist.  Steady gait, slight veer to left.  Pt took 2 standing rest breaks.  Asymptomatic. Pt returned to bed, call light in reach.  Pt IS use and sternal precautions reinforced. Understanding verbalized  Dan Europe

## 2013-03-10 NOTE — Progress Notes (Signed)
Pt was offered by staff to ambulate before bedtime. Pt refused, she stated she was to tired to walk. Pt ambulated twice on previous shift.

## 2013-03-10 NOTE — Progress Notes (Signed)
Disconnected from pacing box per MD order. Wires rolled and taped to patient. Pt tolerated well. Will continue to monitor. Fraser Din, RN

## 2013-03-11 LAB — PROTIME-INR
INR: 1.85 — ABNORMAL HIGH (ref 0.00–1.49)
Prothrombin Time: 20.8 seconds — ABNORMAL HIGH (ref 11.6–15.2)

## 2013-03-11 LAB — BASIC METABOLIC PANEL
CO2: 33 mEq/L — ABNORMAL HIGH (ref 19–32)
Chloride: 95 mEq/L — ABNORMAL LOW (ref 96–112)
Creatinine, Ser: 0.8 mg/dL (ref 0.50–1.10)
GFR calc Af Amer: 82 mL/min — ABNORMAL LOW (ref >90)
Potassium: 3.2 mEq/L — ABNORMAL LOW (ref 3.5–5.1)
Sodium: 136 mEq/L (ref 135–145)

## 2013-03-11 MED ORDER — POTASSIUM CHLORIDE CRYS ER 20 MEQ PO TBCR
40.0000 meq | EXTENDED_RELEASE_TABLET | Freq: Once | ORAL | Status: AC
  Administered 2013-03-11: 40 meq via ORAL
  Filled 2013-03-11: qty 2

## 2013-03-11 MED ORDER — DILTIAZEM HCL 30 MG PO TABS
30.0000 mg | ORAL_TABLET | Freq: Three times a day (TID) | ORAL | Status: DC
  Administered 2013-03-11 – 2013-03-13 (×4): 30 mg via ORAL
  Filled 2013-03-11 (×9): qty 1

## 2013-03-11 MED ORDER — WARFARIN SODIUM 2.5 MG PO TABS
2.5000 mg | ORAL_TABLET | Freq: Every day | ORAL | Status: DC
  Administered 2013-03-11: 2.5 mg via ORAL
  Filled 2013-03-11 (×2): qty 1

## 2013-03-11 NOTE — Progress Notes (Signed)
Removed EPW per MD order per hospital policy. Called PA regarding patient's current AFIB rhythm and was advised to continue as ordered today. Pacing wires intact upon removal. Patient tolerated well, VSS, small amount of blood present. Applied guaze to site and reminded patient to remain in bed for 1 hour. Will continue to monitor closely. Lajuana Matte, RN

## 2013-03-11 NOTE — Progress Notes (Addendum)
Theodore Demark NP notified of concern with Lasix IV and low BPs. Pt asymptomatic and resting in bed. Last BP 112/50. Advised to administer. Will continue to monitor closely. Fraser Din RN

## 2013-03-11 NOTE — Progress Notes (Addendum)
Patient ID: Nicole [name], female   DOB: 1936/03/08, 77 y.o.   MRN: [id number]       Subjective:   Still some SOB this morning but improving   Objective:   Temp:  [98.2 F (36.8 C)-98.4 F (36.9 C)] 98.4 F (36.9 C) (10/12 0458) Pulse Rate:  [78-84] 78 (10/12 0458) Resp:  [19-21] 20 (10/12 0458) BP: (100-114)/(46-65) 100/65 mmHg (10/12 0458) SpO2:  [95 %-98 %] 96 % (10/12 0815) FiO2 (%):  [28 %] 28 % (10/12 0815) Weight:  [197 lb 11.2 oz (89.676 kg)] 197 lb 11.2 oz (89.676 kg) (10/12 0458) Last BM Date: 03/10/13  Filed Weights   03/09/13 0522 03/10/13 0456 03/11/13 0458  Weight: 204 lb 12.9 oz (92.9 kg) 199 lb 1.2 oz (90.3 kg) 197 lb 11.2 oz (89.676 kg)    Intake/Output Summary (Last 24 hours) at 03/11/13 0958 Last data filed at 03/11/13 0805  Gross per 24 hour  Intake    480 ml  Output   2200 ml  Net  -1720 ml    Telemetry:afib, rates 70-110s  Exam:  General:NAD  Resp:faint crackles bilateral bases  Cardiac:irreg, rate 75, no m/r/g, + JVD to angle of jaw  IO:NGEXBMW soft, NT, ND  UXL:[id number] warm, 2+ bilateral edema  Neuro: no focal deficits  Psych: appropriate affect  Lab Results:  Basic Metabolic Panel:  Recent Labs Lab 03/06/13 0315 03/06/13 0400 03/06/13 1622  03/08/13 0400 03/09/13 0530 03/10/13 0630  NA 135  --   --   < > 133* 136 135  K 4.9  --   --   < > 4.6 4.9 4.1  CL 105  --   --   < > 100 100 98  CO2 23  --   --   < > 25 28 32  GLUCOSE 203*  --   --   < > 111* 110* 122*  BUN 13  --   --   < > 27* 24* 20  CREATININE 0.74  --  0.98  < > 0.96 0.82 0.82  CALCIUM 7.8*  --   --   < > 8.4 8.6 8.6  MG 7.2* 3.9* 3.0*  --   --   --   --   < > = values in this interval not displayed.  Liver Function Tests: No results found for this basename: AST, ALT, ALKPHOS, BILITOT, PROT, ALBUMIN,  in the last 168 hours  CBC:  Recent Labs Lab 03/06/13 1622 03/06/13 1624 03/07/13 0455 03/09/13 0530  WBC 18.6*  --  17.0* 10.4  HGB 9.7* 10.9*  9.3* 8.4*  HCT 29.0* 32.0* 27.8* 25.2*  MCV 89.2  --  88.3 88.4  PLT 133*  --  133* 158    Cardiac Enzymes: No results found for this basename: CKTOTAL, CKMB, CKMBINDEX, TROPONINI,  in the last 168 hours  BNP:  Recent Labs  02/28/13 1100  PROBNP 1549.0*    Coagulation:  Recent Labs Lab 03/05/13 1400 03/10/13 0630 03/11/13 0423  INR 1.61* 1.28 1.85*    ECG:   Medications:   Scheduled Medications: . albuterol  2.5 mg Nebulization TID   And  . ipratropium  0.5 mg Nebulization TID  . atorvastatin  40 mg Oral QPM  . docusate sodium  200 mg Oral Daily  . enoxaparin (LOVENOX) injection  40 mg Subcutaneous QHS  . famotidine  20 mg Oral BID  . furosemide  40 mg Intravenous BID  . multivitamin with minerals  1 tablet Oral  Daily  . sodium chloride  3 mL Intravenous Q12H  . warfarin  2.5 mg Oral Daily  . Warfarin - Physician Dosing Inpatient   Does not apply q1800     Infusions:     PRN Medications:  sodium chloride, acetaminophen, albuterol, ALPRAZolam, bisacodyl, bisacodyl, ondansetron (ZOFRAN) IV, ondansetron, oxyCODONE, sodium chloride, traMADol  01/2013 echo: LVEF 55-60%, mod to severe AS, mild AI, normal diastolic function   Assessment/Plan    77 yo female hx of afib, diastolic HF, and severe AS s/p bioprosthetic AVR with MAZE procedure on lasix 40mg  IV bid.   1.Heart failure with preserved ejection fraction - wt down 2 lbs from yesterday per chart, net neg 1.7 liters - still with evidence of volume overload on exam, still with some SOB. Continue IV lasix today - labs pending for today  2. Afib - s/p MAZE procedure, telemetry does show some afib overnight with some occasional rates in 110s - pt back on coumadin  - will resume her diltiazem, start with short acting with hold parameters. Convert to long acting if tolerates well.   3. AVR - continuing to recover from surgery, progressing well.    Dina Rich, M.D., F.A.C.C.

## 2013-03-11 NOTE — Progress Notes (Addendum)
       [address]       [place] [address]             [phone]          6 Days Post-Op Procedure(s) (LRB): AORTIC VALVE REPLACEMENT (AVR) (N/A) MAZE (N/A) INTRAOPERATIVE TRANSESOPHAGEAL ECHOCARDIOGRAM (N/A) MITRAL VALVE REPAIR (MVR) (N/A)  Subjective: Feels better overall today.  She did get up to walk earlier this am and HR became elevated.  Pt asymptomatic.   Objective: Vital signs in last 24 hours: Patient Vitals for the past 24 hrs:  BP Temp Temp src Pulse Resp SpO2 Weight  03/11/13 0815 - - - - - 96 % -  03/11/13 0458 100/65 mmHg 98.4 F (36.9 C) Oral 78 20 97 % 197 lb 11.2 oz (89.676 kg)  03/10/13 2008 - - - - - 97 % -  03/10/13 1948 106/62 mmHg 98.3 F (36.8 C) Oral 84 21 98 % -  03/10/13 1632 - - - - - 96 % -  03/10/13 1401 114/46 mmHg 98.2 F (36.8 C) Oral 81 19 95 % -   Current Weight  03/11/13 197 lb 11.2 oz (89.676 kg)  PRE OP WEIGHT: 87 kg    Intake/Output from previous day: 10/11 0701 - 10/12 0700 In: 360 [P.O.:360] Out: 2150 [Urine:2150]    PHYSICAL EXAM:  Heart: Irr irr, rates 90s Lungs: Decreased BS in bases Wound: Clean and dry Extremities: Mild LE edema    Lab Results: CBC: Recent Labs  03/09/13 0530  WBC 10.4  HGB 8.4*  HCT 25.2*  PLT 158   BMET:  Recent Labs  03/09/13 0530 03/10/13 0630  NA 136 135  K 4.9 4.1  CL 100 98  CO2 28 32  GLUCOSE 110* 122*  BUN 24* 20  CREATININE 0.82 0.82  CALCIUM 8.6 8.6    PT/INR:  Recent Labs  03/11/13 0423  LABPROT 20.8*  INR 1.85*      Assessment/Plan: S/P Procedure(s) (LRB): AORTIC VALVE REPLACEMENT (AVR) (N/A) MAZE (N/A) INTRAOPERATIVE TRANSESOPHAGEAL ECHOCARDIOGRAM (N/A) MITRAL VALVE REPAIR (MVR) (N/A) CV- HRs increased with ambulation.  Rhythm has been sinus, presently rate controlled AF.  Will watch.  Has had tachy/brady postop, but no bradycardia recently. Off all meds except Coumadin. Will give 2.5 mg tonight. INR trending up.  Probably  need to get EPWs out soon. Vol overload/CHF- continue IV Lasix. CRPI, pulm toilet, wean O2.   LOS: 11 days    COLLINS,GINA H 03/11/2013 transfer to snf when bed readyRemove pacing wires today I have seen and examined [name] and agree with the above assessment  and plan.  Delight Ovens MD Beeper [phone] Office [phone] 03/11/2013 10:54 AM

## 2013-03-12 LAB — CBC
HCT: 25 % — ABNORMAL LOW (ref 36.0–46.0)
MCH: 29.3 pg (ref 26.0–34.0)
MCHC: 32.8 g/dL (ref 30.0–36.0)
RDW: 15 % (ref 11.5–15.5)
WBC: 7.9 10*3/uL (ref 4.0–10.5)

## 2013-03-12 LAB — BASIC METABOLIC PANEL
BUN: 16 mg/dL (ref 6–23)
Chloride: 96 mEq/L (ref 96–112)
Creatinine, Ser: 0.83 mg/dL (ref 0.50–1.10)
GFR calc Af Amer: 78 mL/min — ABNORMAL LOW (ref >90)
GFR calc non Af Amer: 67 mL/min — ABNORMAL LOW (ref >90)
Glucose, Bld: 114 mg/dL — ABNORMAL HIGH (ref 70–99)

## 2013-03-12 LAB — PROTIME-INR
INR: 2.74 — ABNORMAL HIGH (ref 0.00–1.49)
Prothrombin Time: 28.1 seconds — ABNORMAL HIGH (ref 11.6–15.2)

## 2013-03-12 MED ORDER — POTASSIUM CHLORIDE CRYS ER 20 MEQ PO TBCR: 40.0000 meq | EXTENDED_RELEASE_TABLET | Freq: Once | ORAL | Status: DC

## 2013-03-12 MED ORDER — WARFARIN SODIUM 1 MG PO TABS
1.0000 mg | ORAL_TABLET | Freq: Once | ORAL | Status: DC
  Filled 2013-03-12: qty 1

## 2013-03-12 MED ORDER — AMIODARONE HCL 200 MG PO TABS
200.0000 mg | ORAL_TABLET | Freq: Two times a day (BID) | ORAL | Status: DC
  Administered 2013-03-12 (×2): 200 mg via ORAL
  Filled 2013-03-12 (×4): qty 1

## 2013-03-12 MED ORDER — FERROUS SULFATE 325 (65 FE) MG PO TABS
325.0000 mg | ORAL_TABLET | Freq: Two times a day (BID) | ORAL | Status: DC
  Administered 2013-03-12 – 2013-03-16 (×8): 325 mg via ORAL
  Filled 2013-03-12 (×10): qty 1

## 2013-03-12 MED ORDER — POTASSIUM CHLORIDE CRYS ER 20 MEQ PO TBCR: 20.0000 meq | EXTENDED_RELEASE_TABLET | Freq: Once | ORAL | Status: DC

## 2013-03-12 MED ORDER — POTASSIUM CHLORIDE CRYS ER 20 MEQ PO TBCR
40.0000 meq | EXTENDED_RELEASE_TABLET | Freq: Once | ORAL | Status: AC
  Administered 2013-03-12: 40 meq via ORAL
  Filled 2013-03-12: qty 2

## 2013-03-12 NOTE — Discharge Summary (Signed)
Physician Discharge Summary       [address] [place]       [place] [address]             [phone]    Patient ID: Nicole [name] MRN: [id number] DOB/AGE: 06-Mar-1936 77 y.o.  Admit date: 02/28/2013 Discharge date: 03/16/2013  Admission Diagnoses: 1. Severe aortic stenosis 2. Moderate mitral regurgitation 3. PAF 4. History of hyperlipidemia 5. History of COPD 6. History of diastolic CHF 7. History of tobacco abuse 8. History of anxiety  Discharge Diagnoses:  1. Severe aortic stenosis 2. Moderate mitral regurgitation 3. PAF 4. History of hyperlipidemia 5. History of COPD 6. History of diastolic CHF 7. History of tobacco abuse 8. History of anxiety 9. ABL anemia  Procedure (s): 1.Cardiac catheterization done by Dr. Shirlee Latch on 03/02/2013: Coronary angiography:  Coronary dominance: right  Left mainstem: Anomalous origin off the right cusp. No plaque or stenosis. No extrinsic compression noted. Bifurcates into LAD and LCx at what appears to be the typical location.  Left anterior descending (LAD): No plaque or stenosis, follows normal course.  Left circumflex (LCx): No plaque or stenosis, follows normal course.  Right coronary artery (RCA): Normal origin, no plaque or stenosis.  Left ventriculography: Not done, severe AS  2.Median Sternotomy, extracorporeal circulation, aortic valve replacement using a 21 mm Edwards Magna-ease pericardial valve, mitral valve annuloplasty using a 26 mm Sorin 3-D Memo Ring, clipping of Left atrial appendage using a 35 mm Atricure Atriclip, and cox Maze IV by Dr. Laneta Simmers on 03/05/2013.   History of Presenting Illness: This is a 77 year old woman with history of smoking, COPD, and hyperlipidemia who reports developing shortness of breath in late July. She was noted to be in atrial fibrillation for the first time and was hospitalized twice in [place] and cardioverted. She was also treated with amiodarone. She was seen by Dr. Shirlee Latch and  was back in A-fib and cardioverted again on 02/13/2013. She said she felt much better, but then on Monday, she became short of breath again with minimal activity with tachypalpitations. She had a 2D echo on 02/13/2013 showing moderate to severe aortic stenosis with AVA of 0.51 with a mean gradient of 31 and a peak of 46. A TEE on 03/01/2013 showed severe AS with a mean gradient of 43 and AVA of 0.79 with a heavily calcified aortic valve. There is mild to moderate MR with a thickened valve. 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 A cardiothoracic consultation was obtained with Dr. Laneta Simmers for the consideration of aortic valve replacement, Maze, and LA clip, and possible mitral valve repair. Potential risks, complications, and benefits were discussed with the patient and she agreed to proceed with surgery. She underwent an AVR, MV repair, Cox maze, and clipping of LA appendage on 03/05/2013.  Brief Hospital Course:  The patient was extubated later the evening of surgery without difficulty. She remained afebrile and hemodynamically stable. She was weaned off of pressors. She did require AV pacing for the first couple of days post operatively. Nicole [name], a line, chest tubes, and foley were removed early in the post operative course. Lopressor and Amiodarone were not started secondary to escape rhythm and bradycardia.She also had a LBBB post op. Coumadin was not started initially in case she needed a PPM. She was volume over loaded and diuresed. Initially, she was diuresed with IV Lasix  and upon discharge she was transitioned to oral Lasix.She was weaned off the insulin drip.  The patient's HGA1C pre op was  5.4. Glucose checks and sliding scale were discontinued.She did have ABL anemia. Her last H and H was 8.2 and 25. She did not require a post op transfusion. She was started on ferrous sulfate.The patient was  felt surgically stable for transfer from the ICU to PCTU for further convalescence on 03/08/2013. She was started on Coumadin and her PT and INR were monitored daily. Her last INR was up to 2.91. Coumadin will be held 10/17 and she will take 2 mg of Coumadin starting 10/18. Her rhythm appears to be accelerated junctional vs sinus with low amplitude P waves.She continues to progress with cardiac rehab. She was ambulating on 2 liters of oxygen via . She will be evaluated today to determine if she will need oxygen at the time of discharge. She has been tolerating a diet and has had a bowel movement. Epicardial pacing wires and chest tube sutures were removed prior to discharge. Dr. Shirlee Latch made recommendations about her medicines and these have been adjusted accordingly. She is felt surgically stable for discharge to SNF today.  Latest Vital Signs: Blood pressure 113/51, pulse 100, temperature 98.3 F (36.8 C), temperature source Oral, resp. rate 18, height 5\' 3"  (1.6 m), weight 89.3 kg (196 lb 13.9 oz), SpO2 100.00%.  Physical Exam: Cardiovascular: RRR  Pulmonary: Slightly diminished at bases bilaterally; no rales, wheezes, or rhonchi.  Abdomen: Soft, non tender, bowel sounds present.  Extremities: Mild bilateral lower extremity edema, but is decreasing  Wounds: Clean and dry.    Discharge Condition:Stable  Recent laboratory studies:  Lab Results  Component Value Date   WBC 12.4* 03/15/2013   HGB 8.8* 03/15/2013   HCT 27.6* 03/15/2013   MCV 88.7 03/15/2013   PLT 262 03/15/2013   Lab Results  Component Value Date   NA 136 03/16/2013   K 3.9 03/16/2013   CL 93* 03/16/2013   CO2 35* 03/16/2013   CREATININE 1.01 03/16/2013   GLUCOSE 120* 03/16/2013      Diagnostic Studies: Dg Chest Port 1 View  03/07/2013   CLINICAL DATA:  Post op cardiac surgery.  EXAM: PORTABLE CHEST - 1 VIEW  COMPARISON:  Multiple priors.  FINDINGS: Nicole [name] catheter has been removed, with the right IJ vascular  sheath remaining. Mediastinal/ thoracic drains have been removed. There are bibasilar opacities. Mild pulmonary vascular congestion. Small bilateral pleural effusions. No pneumothorax.  IMPRESSION: 1. Pulmonary vascular congestion. 2. Bibasilar opacities, likely representing atelectasis with underlying pleural fluid.   Electronically Signed   By: Jerene Dilling M.D.   On: 03/07/2013 08:27        Future Appointments Provider Department Dept Phone   03/26/2013 10:20 AM [facility] [phone]   04/11/2013 12:00 PM Alleen Borne, MD [facility] [phone]    Discharge Medications:   Medication List    STOP taking these medications       XARELTO 20 MG Tabs tablet  Generic drug:  Rivaroxaban      TAKE these medications       ALPRAZolam 0.25 MG tablet  Commonly known as:  XANAX  Take 0.25 mg by mouth at bedtime as needed for sleep.     amiodarone 200 MG tablet  Commonly known as:  PACERONE  Take 2 tablets (400 mg total) by mouth 2 (two) times  daily.     atorvastatin 40 MG tablet  Commonly known as:  LIPITOR  Take 40 mg by mouth every evening.     diltiazem 120 MG 12 hr capsule  Commonly known as:  CARDIZEM SR  Take 1 capsule (120 mg total) by mouth daily.     famotidine 20 MG tablet  Commonly known as:  PEPCID  Take 1 tablet (20 mg total) by mouth 2 (two) times daily. May stop after discharge from SNF.     ferrous sulfate 325 (65 FE) MG tablet  Take 1 tablet (325 mg total) by mouth daily with breakfast. For one month then stop.     furosemide 40 MG tablet  Commonly known as:  LASIX  Take 1 tablet (40 mg total) by mouth 2 (two) times daily.     ipratropium-albuterol 0.5-2.5 (3) MG/3ML Soln  Commonly known as:  DUONEB  Take 3 mLs by nebulization 3 (three) times daily.     multivitamin with minerals tablet  Take 1 tablet by mouth daily.     potassium chloride SA 20  MEQ tablet  Commonly known as:  K-DUR,KLOR-CON  Take 1 tablet (20 mEq total) by mouth 2 (two) times daily.     traMADol 50 MG tablet  Commonly known as:  ULTRAM  Take 1 tablet (50 mg total) by mouth every 4 (four) hours as needed for pain.     warfarin 2 MG tablet  Commonly known as:  COUMADIN  Take 1 tablet (2 mg total) by mouth daily at 6 PM.  Start taking on:  03/17/2013        Follow Up Appointments: Follow-up Information   Follow up with Alleen Borne, MD. (PA/LAT CXR to be taken (at [facility] which is in the same building as Dr. Sharee Pimple office) on 04/11/2013 at 11:00 am;Appointment with Dr. Laneta Simmers is on 04/11/2013 at 12:00 pm: )    Specialty:  Cardiothoracic Surgery   Contact information:   [address] [place] [address] [phone]       Follow up with [name], MD On 03/22/2013. (at 4pm in the CHF clinic; [facility] code 0200)    Specialty:  Cardiology   Contact information:   [address] [place].  [address] [place] [address] [phone]       Follow up with SNF. (Please draw PT and INR (as is on Coumadin) on Monday 03/19/2013. Call or fax results to Dr. Alford Highland office)       Signed: Doree Fudge MPA-C 03/16/2013, 9:29 AM

## 2013-03-12 NOTE — Progress Notes (Addendum)
CSW has confirmed SNF bed at [facility] in [place], [place] when patient is medically ready. CSW updated patient and family.  Maree Krabbe, MSW, [facility] [phone]

## 2013-03-12 NOTE — Progress Notes (Addendum)
Patient ID: Nicole [name], female   DOB: 04/10/1936, 77 y.o.   MRN: [id number]    SUBJECTIVE: Chest sore this morning after coughing.  Some dyspnea.  Diuresed well yesterday.    Nicole [name] albuterol  2.5 mg Nebulization TID   And  . ipratropium  0.5 mg Nebulization TID  . atorvastatin  40 mg Oral QPM  . diltiazem  30 mg Oral Q8H  . docusate sodium  200 mg Oral Daily  . famotidine  20 mg Oral BID  . furosemide  40 mg Intravenous BID  . multivitamin with minerals  1 tablet Oral Daily  . potassium chloride  20 mEq Oral Once  . potassium chloride  40 mEq Oral Once  . sodium chloride  3 mL Intravenous Q12H  . Warfarin - Physician Dosing Inpatient   Does not apply q1800      Filed Vitals:   03/11/13 1700 03/11/13 1801 03/11/13 2201 03/12/13 0530  BP: 96/45 112/50 104/56 112/64  Pulse:  85 90 79  Temp:   98.3 F (36.8 C) 98.2 F (36.8 C)  TempSrc:   Oral Oral  Resp:  20 20 20   Height:      Weight:    89.3 kg (196 lb 13.9 oz)  SpO2:  100% 95% 98%    Intake/Output Summary (Last 24 hours) at 03/12/13 0823 Last data filed at 03/12/13 0531  Gross per 24 hour  Intake      0 ml  Output   1950 ml  Net  -1950 ml    LABS: Basic Metabolic Panel:  Recent Labs  78/29/56 1115 03/12/13 0450  NA 136 136  K 3.2* 3.7  CL 95* 96  CO2 33* 34*  GLUCOSE 121* 114*  BUN 18 16  CREATININE 0.80 0.83  CALCIUM 8.6 8.5   Liver Function Tests: No results found for this basename: AST, ALT, ALKPHOS, BILITOT, PROT, ALBUMIN,  in the last 72 hours No results found for this basename: LIPASE, AMYLASE,  in the last 72 hours CBC:  Recent Labs  03/12/13 0450  WBC 7.9  HGB 8.2*  HCT 25.0*  MCV 89.3  PLT 198   Cardiac Enzymes: No results found for this basename: CKTOTAL, CKMB, CKMBINDEX, TROPONINI,  in the last 72 hours BNP: No components found with this basename: POCBNP,  D-Dimer: No results found for this basename: DDIMER,  in the last 72 hours Hemoglobin A1C: No results found for this  basename: HGBA1C,  in the last 72 hours Fasting Lipid Panel: No results found for this basename: CHOL, HDL, LDLCALC, TRIG, CHOLHDL, LDLDIRECT,  in the last 72 hours Thyroid Function Tests: No results found for this basename: TSH, T4TOTAL, FREET3, T3FREE, THYROIDAB,  in the last 72 hours Anemia Panel: No results found for this basename: VITAMINB12, FOLATE, FERRITIN, TIBC, IRON, RETICCTPCT,  in the last 72 hours  RADIOLOGY: Dg Chest 2 View  02/28/2013   CLINICAL DATA:  Shortness of breath, atrial fibrillation and rapid ventricular rate.  EXAM: CHEST - 2 VIEW  COMPARISON:  None  FINDINGS: The heart size and mediastinal contours are within normal limits. There is no evidence of pulmonary edema, consolidation, pneumothorax, nodule or pleural fluid. Degenerative changes are present in the spine.  IMPRESSION: No active disease.   Electronically Signed   By: Irish Lack   On: 02/28/2013 16:27   Dg Chest Port 1 View  03/07/2013   CLINICAL DATA:  Post op cardiac surgery.  EXAM: PORTABLE CHEST - 1 VIEW  COMPARISON:  Multiple priors.  FINDINGS: Nicole [name] catheter has been removed, with the right IJ vascular sheath remaining. Mediastinal/ thoracic drains have been removed. There are bibasilar opacities. Mild pulmonary vascular congestion. Small bilateral pleural effusions. No pneumothorax.  IMPRESSION: 1. Pulmonary vascular congestion. 2. Bibasilar opacities, likely representing atelectasis with underlying pleural fluid.   Electronically Signed   By: Jerene Dilling M.D.   On: 03/07/2013 08:27   Dg Chest Portable 1 View In Am  03/06/2013   CLINICAL DATA:  CORONARY ARTERIES disease. Recent CABG. Chest pain.  EXAM: PORTABLE CHEST - 1 VIEW  IMPRESSION: Tiny bilateral effusions. Normal vascularity. Edema in the upper lobes has resolved.  COMPARISON: 03/05/2013: COMPARISON: 03/05/2013  FINDINGS:Endotracheal tube and NG tube been removed. Two chest tubes and Swan-Ganz catheter remain in place. No pneumothorax.  Tiny bilateral effusions. Vascularity is normal.   Electronically Signed   By: Nicole [name] M.D.   On: 03/06/2013 07:45   Dg Chest Portable 1 View  03/05/2013   CLINICAL DATA:  Status post aortic valvuloplasty  EXAM: PORTABLE CHEST - 1 VIEW  COMPARISON:  02/28/2013  FINDINGS: Endotracheal tube is appropriately positioned, as is the NG tube. Mediastinal drains are in place. Median sternotomy wires noted. Right IJ Swan-Ganz catheter tip terminates over the main pulmonary artery. Moderate enlargement of the cardiomediastinal silhouette is noted with central vascular congestion and cephalization of vessels which may indicate early edema/fluid overload. No pleural effusion.  IMPRESSION: Cardiomegaly with probable early interstitial edema/ volume overload.   Electronically Signed   By: Nicole [name] M.D.   On: 03/05/2013 15:05    PHYSICAL EXAM General: Mild tachypnea Neck: JVP 12 cm, no thyromegaly or thyroid nodule.  Lungs: Dependent crackles CV: Nondisplaced PMI.  Heart regular S1/S2, no S3/S4, 2/6 SEM.  Trace ankle edema.  No carotid bruit.  Normal pedal pulses.  Abdomen: Soft, nontender, no hepatosplenomegaly, no distention.  Neurologic: Alert and oriented x 3.  Psych: Normal affect. Extremities: No clubbing or cyanosis.   TELEMETRY: Regular rhythm, difficult to tell if sinus or other.   ASSESSMENT AND PLAN: 77 yo with history of atrial fibrillation/RVR, diastolic CHF, and severe AS now s/p bioprosthetic AVR and MAZE.  1. Acute on chronic diastolic CHF: Patient remains volume overloaded and short of breath.  She is diuresing reasonably on current Lasix but still well above her baseline weight.  Would continue IV Lasix today.  2. Bioprosthetic AVR: Stable post-op. 3. Atrial fibrillation: s/p MAZE.  On coumadin, INR is therapeutic.  Rhythm difficult, regular but does not appear to be sinus.  Will restart amiodarone and will get ECG today to determine rhythm.   Nicole [name] 03/12/2013 8:23  AM  ECG shows accelerated junctional rhythm with HR in the 80s.   Nicole [name] 03/12/2013

## 2013-03-12 NOTE — Progress Notes (Signed)
I have reviewed this note and agree with all findings. Kati Christmas Faraci, PT, DPT Pager: [phone]   

## 2013-03-12 NOTE — Progress Notes (Signed)
Physical Therapy Treatment Patient Details Name: Nicole [name] MRN: [id number] DOB: [birth year] Today's Date: 03/12/2013 Time: 1191-4782 PT Time Calculation (min): 26 min  PT Assessment / Plan / Recommendation  History of Present Illness 77 y.o. female admitted to [facility] on 02/28/13 with history of COPD, atrial fibrillation, and diastolic CHF presents with SOB.  [name] on 03/05/13.     PT Comments   Pt demonstrating progress as she was able to ambulate longer distances today.  Pt continues to be limited by fatigue and sternal pain. Pt able to recall all sternal precautions and utilize heart pillow during mobility and coughing bouts to reduce use of UEs and reduce pain. Pt would benefit from skilled PT in order to improve functional mobility and safety.  Follow Up Recommendations  SNF;Supervision/Assistance - 24 hour     Does the patient have the potential to tolerate intense rehabilitation     Barriers to Discharge        Equipment Recommendations  Rolling walker with 5" wheels    Recommendations for Other Services    Frequency Min 3X/week   Progress towards PT Goals Progress towards PT goals: Progressing toward goals  Plan Current plan remains appropriate    Precautions / Restrictions Precautions Precautions: Sternal;Fall Restrictions Weight Bearing Restrictions: No   Pertinent Vitals/Pain No c/o pain at rest, with 10/10 pain during coughing episodes with pt reporting pain decreased once coughing ceases. Pt used heart pillow during coughing bout to decrease pain and did not require cuing to use pillow.  Pt reports receiving pain meds prior to PT session and positioned to comfort at end of session.    Mobility  Bed Mobility Bed Mobility: Not assessed Details for Bed Mobility Assistance: pt sitting in chair upon arrival. Transfers Transfers: Sit to Stand;Stand to Sit Sit to Stand: 4: Min assist;From chair/3-in-1;Without upper extremity assist Stand to Sit: To  chair/3-in-1;Without upper extremity assist;4: Min guard Details for Transfer Assistance: Min A due to sternum pain, weakness, and to ensure pt adheres to sternal precautions. Pt utilizes heart pillow to decr. use of bil UEs during mobility. Pt performed sit<>stand from/to chair and BSC. Ambulation/Gait Ambulation/Gait Assistance: 4: Min assist Ambulation Distance (Feet): 300 Feet Assistive device: Rolling walker Ambulation/Gait Assistance Details: Min A during 1 episode of LOB while trying to negotiate object in hallway, otherwise pt only required min guard for safety during ambulation.  Pt continues to require standing rest breaks due to fatigue. Gait Pattern: Step-through pattern;Decreased stride length;Trunk flexed Gait velocity: decreased General Gait Details: Pt ambulated on 2L of O2 [place] with no c/o of SOB/dizziness, just fatigue. Stairs: No    Exercises     PT Diagnosis:    PT Problem List:   PT Treatment Interventions:     PT Goals (current goals can now be found in the care plan section)    Visit Information  Last PT Received On: 03/12/13 Assistance Needed: +1 History of Present Illness: 77 y.o. female admitted to [facility] on 02/28/13 with history of COPD, atrial fibrillation, and diastolic CHF presents with SOB.  [name] on 03/05/13.      Subjective Data      Cognition  Cognition Arousal/Alertness: Awake/alert Behavior During Therapy: WFL for tasks assessed/performed Overall Cognitive Status: Within Functional Limits for tasks assessed    Balance  Balance Balance Assessed: Yes Static Standing Balance Static Standing - Balance Support: No upper extremity supported;During functional activity Static Standing - Level of Assistance:  5: Stand by assistance Static Standing - Comment/# of Minutes: pt able to stand for 2 min while performing bathroom and hand hygienve without LOB and min guard to ensure safety.  End of Session PT - End of Session Equipment Utilized  During Treatment: Oxygen Activity Tolerance: Patient limited by fatigue Patient left: in chair;with call bell/phone within reach   GP     Sol Blazing 03/12/2013, 11:28 AM

## 2013-03-12 NOTE — Progress Notes (Signed)
CARDIAC REHAB PHASE I   PRE:  Rate/Rhythm: 79 JR with PVCs then into 95 afib    BP: sitting 117/52    SaO2: 91 RA, 95 2L  MODE:  Ambulation: 230 ft   POST:  Rate/Rhythm: 107 afib    BP: sitting 116/45     SaO2: 96 2L  Pt rocked and stood with assist x1 using gait belt. Fairly steady walking, becomes DOE. Rest x2. Did not want to increase distance. Pt was in JR right before I entered room but went into Afib with controlled rate when moving from [facility] to recliner before walk. Maintained 90-100s Afib walking. To recliner after walk. [id number]   Harriet Masson CES, ACSM 03/12/2013 9:43 AM

## 2013-03-12 NOTE — Progress Notes (Addendum)
      [address]       [place] [address]             [phone]        7 Days Post-Op Procedure(s) (LRB): AORTIC VALVE REPLACEMENT (AVR) (N/A) MAZE (N/A) INTRAOPERATIVE TRANSESOPHAGEAL ECHOCARDIOGRAM (N/A) MITRAL VALVE REPAIR (MVR) (N/A)  Subjective: Patient had a "coughing spell" this am so she has some incisional pain. She is requesting a breathing treatment.  Objective: Vital signs in last 24 hours: Temp:  [98.2 F (36.8 C)-98.3 F (36.8 C)] 98.2 F (36.8 C) (10/13 0530) Pulse Rate:  [79-97] 79 (10/13 0530) Cardiac Rhythm:  [-] Atrial fibrillation (10/12 2015) Resp:  [18-20] 20 (10/13 0530) BP: (92-112)/(41-64) 112/64 mmHg (10/13 0530) SpO2:  [95 %-100 %] 98 % (10/13 0530) FiO2 (%):  [28 %] 28 % (10/12 0815) Weight:  [89.3 kg (196 lb 13.9 oz)] 89.3 kg (196 lb 13.9 oz) (10/13 0530)  Pre op weight  87  kg Current Weight  03/12/13 89.3 kg (196 lb 13.9 oz)      Intake/Output from previous day: 10/12 0701 - 10/13 0700 In: 120 [P.O.:120] Out: 2300 [Urine:2300]   Physical Exam:  Cardiovascular: RRR? Pulmonary: Diminished at bases bilaterally; no rales, wheezes, or rhonchi. Abdomen: Soft, non tender, bowel sounds present. Extremities: Mild bilateral lower extremity edema. Wounds: Clean and dry.    Lab Results: CBC:  Recent Labs  03/12/13 0450  WBC 7.9  HGB 8.2*  HCT 25.0*  PLT 198   BMET:   Recent Labs  03/11/13 1115 03/12/13 0450  NA 136 136  K 3.2* 3.7  CL 95* 96  CO2 33* 34*  GLUCOSE 121* 114*  BUN 18 16  CREATININE 0.80 0.83  CALCIUM 8.6 8.5    PT/INR:  Lab Results  Component Value Date   INR 2.74* 03/12/2013   INR 1.85* 03/11/2013   INR 1.28 03/10/2013   ABG:  INR: Will add last result for INR, ABG once components are confirmed Will add last 4 CBG results once components are confirmed  Assessment/Plan:  1. CV - Previously AV paced, junctional, a fib with RVR.? SR this am. On Cardizem 30 tid and Coumadin.  INR increased from 1.85 to 2.74. Will give 1 mg tonight. 2.  Pulmonary - On 2 liters of oxygen via [place]-wean as tolerates.Encourage incentive spirometer.  3. Volume Overload - On Lasix 40 IV bid. May be able to decrease. 4.  Acute blood loss anemia - H and H 8.2 and 25 5.Supplement potassium 6.Hopefully, to SNF soon  ZIMMERMAN,DONIELLE MPA-C 03/12/2013,7:26 AM    Chart reviewed, patient examined, agree with above. INR jumped up quick so will hold coumadin tonight. Her rhythm is irregular and looks like A-fib. HR in the 80's. Continue amio and cardizem.

## 2013-03-13 LAB — CBC
HCT: 27 % — ABNORMAL LOW (ref 36.0–46.0)
Hemoglobin: 8.7 g/dL — ABNORMAL LOW (ref 12.0–15.0)
MCH: 28.7 pg (ref 26.0–34.0)
MCHC: 32.2 g/dL (ref 30.0–36.0)
MCV: 89.1 fL (ref 78.0–100.0)
Platelets: 252 10*3/uL (ref 150–400)
RBC: 3.03 MIL/uL — ABNORMAL LOW (ref 3.87–5.11)
RDW: 15.2 % (ref 11.5–15.5)
WBC: 11 10*3/uL — ABNORMAL HIGH (ref 4.0–10.5)

## 2013-03-13 LAB — PROTIME-INR
INR: 2.42 — ABNORMAL HIGH (ref 0.00–1.49)
Prothrombin Time: 25.5 seconds — ABNORMAL HIGH (ref 11.6–15.2)

## 2013-03-13 LAB — BASIC METABOLIC PANEL
BUN: 16 mg/dL (ref 6–23)
CO2: 35 mEq/L — ABNORMAL HIGH (ref 19–32)
Calcium: 8.9 mg/dL (ref 8.4–10.5)
Creatinine, Ser: 0.87 mg/dL (ref 0.50–1.10)
Glucose, Bld: 115 mg/dL — ABNORMAL HIGH (ref 70–99)
Potassium: 4 mEq/L (ref 3.5–5.1)

## 2013-03-13 LAB — GLUCOSE, CAPILLARY

## 2013-03-13 MED ORDER — AMIODARONE HCL 200 MG PO TABS
400.0000 mg | ORAL_TABLET | Freq: Two times a day (BID) | ORAL | Status: DC
  Administered 2013-03-13 – 2013-03-16 (×7): 400 mg via ORAL
  Filled 2013-03-13 (×8): qty 2

## 2013-03-13 MED ORDER — DILTIAZEM HCL ER COATED BEADS 120 MG PO CP24
120.0000 mg | ORAL_CAPSULE | Freq: Every day | ORAL | Status: DC
  Administered 2013-03-13 – 2013-03-16 (×4): 120 mg via ORAL
  Filled 2013-03-13 (×4): qty 1

## 2013-03-13 MED ORDER — POTASSIUM CHLORIDE CRYS ER 20 MEQ PO TBCR
40.0000 meq | EXTENDED_RELEASE_TABLET | Freq: Once | ORAL | Status: AC
  Administered 2013-03-13: 40 meq via ORAL
  Filled 2013-03-13: qty 2

## 2013-03-13 MED ORDER — FUROSEMIDE 10 MG/ML IJ SOLN
40.0000 mg | Freq: Three times a day (TID) | INTRAMUSCULAR | Status: DC
  Administered 2013-03-13 – 2013-03-14 (×2): 40 mg via INTRAVENOUS
  Filled 2013-03-13 (×3): qty 4

## 2013-03-13 MED ORDER — WARFARIN SODIUM 2 MG PO TABS
2.0000 mg | ORAL_TABLET | Freq: Every day | ORAL | Status: DC
  Administered 2013-03-13: 2 mg via ORAL
  Filled 2013-03-13 (×2): qty 1

## 2013-03-13 NOTE — Progress Notes (Signed)
CSW spoke to patient's daughter about dc possibly tomorrow. Patient's daughter stated she may take patient to SNF if the EMS will not be covered. CSW encouraged daughter to call the insurance company if she has concerns. CSW will update facility and will help facilitate dc when patient is medically ready.   Maree Krabbe, MSW, [facility] [phone]

## 2013-03-13 NOTE — Progress Notes (Signed)
Patient ID: Nicole [name], female   DOB: 1936-03-14, 77 y.o.   MRN: [id number]   SUBJECTIVE: Feeling better this morning, less short of breath.  She was in a junctional rhythm yesterday and is in atrial fibrillation this morning.    Marland Kitchen albuterol  2.5 mg Nebulization TID   And  . ipratropium  0.5 mg Nebulization TID  . amiodarone  400 mg Oral BID  . atorvastatin  40 mg Oral QPM  . diltiazem  120 mg Oral Daily  . docusate sodium  200 mg Oral Daily  . famotidine  20 mg Oral BID  . ferrous sulfate  325 mg Oral BID WC  . furosemide  40 mg Intravenous Q8H  . multivitamin with minerals  1 tablet Oral Daily  . potassium chloride  20 mEq Oral Once  . potassium chloride  40 mEq Oral Once  . sodium chloride  3 mL Intravenous Q12H  . warfarin  2 mg Oral q1800  . Warfarin - Physician Dosing Inpatient   Does not apply q1800      Filed Vitals:   03/12/13 2003 03/12/13 2130 03/13/13 0607 03/13/13 0611  BP: 103/69   117/77  Pulse: 102   98  Temp: 98.6 F (37 C)   98 F (36.7 C)  TempSrc: Oral   Oral  Resp: 17   18  Height:      Weight:   196 lb 10.4 oz (89.2 kg)   SpO2: 98% 96%  97%    Intake/Output Summary (Last 24 hours) at 03/13/13 0824 Last data filed at 03/13/13 0600  Gross per 24 hour  Intake    120 ml  Output   2000 ml  Net  -1880 ml    LABS: Basic Metabolic Panel:  Recent Labs  40/98/11 0450 03/13/13 0500  NA 136 136  K 3.7 4.0  CL 96 94*  CO2 34* 35*  GLUCOSE 114* 115*  BUN 16 16  CREATININE 0.83 0.87  CALCIUM 8.5 8.9  MG  --  2.1   Liver Function Tests: No results found for this basename: AST, ALT, ALKPHOS, BILITOT, PROT, ALBUMIN,  in the last 72 hours No results found for this basename: LIPASE, AMYLASE,  in the last 72 hours CBC:  Recent Labs  03/12/13 0450 03/13/13 0500  WBC 7.9 11.0*  HGB 8.2* 8.7*  HCT 25.0* 27.0*  MCV 89.3 89.1  PLT 198 252   Cardiac Enzymes: No results found for this basename: CKTOTAL, CKMB, CKMBINDEX, TROPONINI,  in the last  72 hours BNP: No components found with this basename: POCBNP,  D-Dimer: No results found for this basename: DDIMER,  in the last 72 hours Hemoglobin A1C: No results found for this basename: HGBA1C,  in the last 72 hours Fasting Lipid Panel: No results found for this basename: CHOL, HDL, LDLCALC, TRIG, CHOLHDL, LDLDIRECT,  in the last 72 hours Thyroid Function Tests: No results found for this basename: TSH, T4TOTAL, FREET3, T3FREE, THYROIDAB,  in the last 72 hours Anemia Panel: No results found for this basename: VITAMINB12, FOLATE, FERRITIN, TIBC, IRON, RETICCTPCT,  in the last 72 hours  RADIOLOGY: Dg Chest 2 View  02/28/2013   CLINICAL DATA:  Shortness of breath, atrial fibrillation and rapid ventricular rate.  EXAM: CHEST - 2 VIEW  COMPARISON:  None  FINDINGS: The heart size and mediastinal contours are within normal limits. There is no evidence of pulmonary edema, consolidation, pneumothorax, nodule or pleural fluid. Degenerative changes are present in the spine.  IMPRESSION:  No active disease.   Electronically Signed   By: Irish Lack   On: 02/28/2013 16:27   Dg Chest Port 1 View  03/07/2013   CLINICAL DATA:  Post op cardiac surgery.  EXAM: PORTABLE CHEST - 1 VIEW  COMPARISON:  Multiple priors.  FINDINGS: Theone Murdoch catheter has been removed, with the right IJ vascular sheath remaining. Mediastinal/ thoracic drains have been removed. There are bibasilar opacities. Mild pulmonary vascular congestion. Small bilateral pleural effusions. No pneumothorax.  IMPRESSION: 1. Pulmonary vascular congestion. 2. Bibasilar opacities, likely representing atelectasis with underlying pleural fluid.   Electronically Signed   By: Jerene Dilling M.D.   On: 03/07/2013 08:27   Dg Chest Portable 1 View In Am  03/06/2013   CLINICAL DATA:  CORONARY ARTERIES disease. Recent CABG. Chest pain.  EXAM: PORTABLE CHEST - 1 VIEW  IMPRESSION: Tiny bilateral effusions. Normal vascularity. Edema in the upper lobes has  resolved.  COMPARISON: 03/05/2013: COMPARISON: 03/05/2013  FINDINGS:Endotracheal tube and NG tube been removed. Two chest tubes and Swan-Ganz catheter remain in place. No pneumothorax. Tiny bilateral effusions. Vascularity is normal.   Electronically Signed   By: Geanie Cooley M.D.   On: 03/06/2013 07:45   Dg Chest Portable 1 View  03/05/2013   CLINICAL DATA:  Status post aortic valvuloplasty  EXAM: PORTABLE CHEST - 1 VIEW  COMPARISON:  02/28/2013  FINDINGS: Endotracheal tube is appropriately positioned, as is the NG tube. Mediastinal drains are in place. Median sternotomy wires noted. Right IJ Swan-Ganz catheter tip terminates over the main pulmonary artery. Moderate enlargement of the cardiomediastinal silhouette is noted with central vascular congestion and cephalization of vessels which may indicate early edema/fluid overload. No pleural effusion.  IMPRESSION: Cardiomegaly with probable early interstitial edema/ volume overload.   Electronically Signed   By: Christiana Pellant M.D.   On: 03/05/2013 15:05    PHYSICAL EXAM General: Mild tachypnea Neck: JVP 12 cm, no thyromegaly or thyroid nodule.  Lungs: Dependent crackles CV: Nondisplaced PMI.  Heart irregular S1/S2, no S3/S4, 2/6 SEM.  Trace ankle edema.  No carotid bruit.  Normal pedal pulses.  Abdomen: Soft, nontender, no hepatosplenomegaly, no distention.  Neurologic: Alert and oriented x 3.  Psych: Normal affect. Extremities: No clubbing or cyanosis.   TELEMETRY: Regular rhythm, difficult to tell if sinus or other.   ASSESSMENT AND PLAN: 77 yo with history of atrial fibrillation/RVR, diastolic CHF, and severe AS now s/p bioprosthetic AVR and MAZE.  1. Acute on chronic diastolic CHF: Patient remains volume overloaded and short of breath.  Still needs fluid off.  Well above baseline weight.  Increase Lasix to 40 mg IV every 8 hrs today.  2. Bioprosthetic AVR: Stable post-op. 3. Atrial fibrillation: s/p MAZE.  On coumadin, INR is therapeutic.   Now back in atrial fibrillation.  Consolidate to diltiazem CD and increase amiodarone to 400 mg bid.  If she stays in atrial fibrillation overnight, plan TEE-guided DCCV tomorrow.  Rate is much better controlled than before MAZE.   [name] 03/13/2013 8:24 AM

## 2013-03-13 NOTE — Progress Notes (Addendum)
      [address]       [place] [id number]             [phone]        8 Days Post-Op Procedure(s) (LRB): AORTIC VALVE REPLACEMENT (AVR) (N/A) MAZE (N/A) INTRAOPERATIVE TRANSESOPHAGEAL ECHOCARDIOGRAM (N/A) MITRAL VALVE REPAIR (MVR) (N/A)  Subjective: Patient feeling better this am.  Objective: Vital signs in last 24 hours: Temp:  [97.9 F (36.6 C)-98.6 F (37 C)] 98 F (36.7 C) (10/14 0611) Pulse Rate:  [83-102] 98 (10/14 0611) Cardiac Rhythm:  [-] Atrial fibrillation (10/13 1920) Resp:  [16-18] 18 (10/14 0611) BP: (99-117)/(44-77) 117/77 mmHg (10/14 0611) SpO2:  [96 %-98 %] 97 % (10/14 0611) Weight:  [89.2 kg (196 lb 10.4 oz)] 89.2 kg (196 lb 10.4 oz) (10/14 0607)  Pre op weight  87  kg Current Weight  03/13/13 89.2 kg (196 lb 10.4 oz)      Intake/Output from previous day: 10/13 0701 - 10/14 0700 In: 240 [P.O.:240] Out: 2000 [Urine:2000]   Physical Exam:  Cardiovascular: [facility] Pulmonary: Diminished at bases bilaterally; no rales, wheezes, or rhonchi. Abdomen: Soft, non tender, bowel sounds present. Extremities: Mild bilateral lower extremity edema, but is decreasing Wounds: Clean and dry.    Lab Results: CBC:  Recent Labs  03/12/13 0450 03/13/13 0500  WBC 7.9 11.0*  HGB 8.2* 8.7*  HCT 25.0* 27.0*  PLT 198 252   BMET:   Recent Labs  03/12/13 0450 03/13/13 0500  NA 136 136  K 3.7 4.0  CL 96 94*  CO2 34* 35*  GLUCOSE 114* 115*  BUN 16 16  CREATININE 0.83 0.87  CALCIUM 8.5 8.9    PT/INR:  Lab Results  Component Value Date   INR 2.42* 03/13/2013   INR 2.74* 03/12/2013   INR 1.85* 03/11/2013   ABG:  INR: Will add last result for INR, ABG once components are confirmed Will add last 4 CBG results once components are confirmed  Assessment/Plan:  1. CV - Previously AV paced, junctional, a fib with RVR.Appears a fib with CVR this am. On Cardizem 30 tid, Amiodarone200 bid, and Coumadin. INR decreased from  2.74  to 2.42. Will give 2 mg tonight. 2.  Pulmonary - On 2 liters of oxygen via [place] as tolerates.Encourage incentive spirometer. Check CXR in am 3. Volume Overload - On Lasix 40 IV bid. May be able to decrease. 4.  Acute blood loss anemia - H and H 8.7 and 27 5.To SNF, once rhythm stabilized  ZIMMERMAN,DONIELLE MPA-C 03/13/2013,7:22 AM      Chart reviewed, patient examined, agree with above. Cardiology may do DCCV tomorrow if she remains in A-fib. She is diuresing well but weight is still 12 lbs over preop. Lasix increased to tid.

## 2013-03-13 NOTE — Progress Notes (Signed)
CARDIAC REHAB PHASE I   PRE:  Rate/Rhythm: 82 afib    BP: sitting 106/59    SaO2: 98 2L  MODE:  Ambulation: 350 ft   POST:  Rate/Rhythm: 104 afib    BP: sitting 128/68     SaO2: walking 87-93 RA, 94 2L  After walk  Assist x1 to get pt to EOB. Ambulated 70 ft without O2 SaO2 borderline, fluctuating between 87-93 RA. Increased O2 to 2L. Pt tolerated walk well. Steady with DOE using RW. Rest x2. To recliner after walk. Good progression of distance today. Remains in afib. Will f/u. [id number]  Elissa Lovett [place] CES, ACSM 03/13/2013 11:36 AM

## 2013-03-14 ENCOUNTER — Inpatient Hospital Stay (HOSPITAL_COMMUNITY): Payer: Medicare Other

## 2013-03-14 LAB — BASIC METABOLIC PANEL
BUN: 16 mg/dL (ref 6–23)
CO2: 36 mEq/L — ABNORMAL HIGH (ref 19–32)
Chloride: 95 mEq/L — ABNORMAL LOW (ref 96–112)
Glucose, Bld: 118 mg/dL — ABNORMAL HIGH (ref 70–99)
Potassium: 4.8 mEq/L (ref 3.5–5.1)
Sodium: 135 mEq/L (ref 135–145)

## 2013-03-14 LAB — PROTIME-INR: INR: 2.38 — ABNORMAL HIGH (ref 0.00–1.49)

## 2013-03-14 MED ORDER — DILTIAZEM HCL ER 120 MG PO CP12: 120.0000 mg | ORAL_CAPSULE | Freq: Every day | ORAL | Status: DC

## 2013-03-14 MED ORDER — FUROSEMIDE 10 MG/ML IJ SOLN
60.0000 mg | Freq: Three times a day (TID) | INTRAMUSCULAR | Status: DC
  Administered 2013-03-14 – 2013-03-16 (×6): 60 mg via INTRAVENOUS
  Filled 2013-03-14 (×7): qty 6

## 2013-03-14 MED ORDER — TRAMADOL HCL 50 MG PO TABS: 50.0000 mg | ORAL_TABLET | ORAL | Status: DC | PRN

## 2013-03-14 MED ORDER — FERROUS SULFATE 325 (65 FE) MG PO TABS: 325.0000 mg | ORAL_TABLET | Freq: Every day | ORAL | Status: DC

## 2013-03-14 MED ORDER — WARFARIN SODIUM 2.5 MG PO TABS: 2.5000 mg | ORAL_TABLET | Freq: Every day | ORAL | Status: DC

## 2013-03-14 MED ORDER — WARFARIN SODIUM 2.5 MG PO TABS
2.5000 mg | ORAL_TABLET | Freq: Every day | ORAL | Status: DC
  Administered 2013-03-14: 2.5 mg via ORAL
  Filled 2013-03-14 (×2): qty 1

## 2013-03-14 MED ORDER — AMIODARONE HCL 400 MG PO TABS: 400.0000 mg | ORAL_TABLET | Freq: Two times a day (BID) | ORAL | Status: DC

## 2013-03-14 MED ORDER — FAMOTIDINE 20 MG PO TABS: 20.0000 mg | ORAL_TABLET | Freq: Two times a day (BID) | ORAL | Status: DC

## 2013-03-14 NOTE — Progress Notes (Addendum)
      [address]       [place] [address]             [phone]        9 Days Post-Op Procedure(s) (LRB): AORTIC VALVE REPLACEMENT (AVR) (N/A) MAZE (N/A) INTRAOPERATIVE TRANSESOPHAGEAL ECHOCARDIOGRAM (N/A) MITRAL VALVE REPAIR (MVR) (N/A)  Subjective: Patient   Objective: Vital signs in last 24 hours: Temp:  [97.8 F (36.6 C)-98.6 F (37 C)] 98.4 F (36.9 C) (10/15 0531) Pulse Rate:  [79-91] 79 (10/15 0531) Cardiac Rhythm:  [-] Other (Comment) (10/15 0730) Resp:  [16-20] 18 (10/15 0531) BP: (94-108)/(47-61) 102/52 mmHg (10/15 0531) SpO2:  [94 %-99 %] 94 % (10/15 0531) Weight:  [89.1 kg (196 lb 6.9 oz)] 89.1 kg (196 lb 6.9 oz) (10/15 0507)  Pre op weight  87  kg Current Weight  03/14/13 89.1 kg (196 lb 6.9 oz)      Intake/Output from previous day: 10/14 0701 - 10/15 0700 In: 720 [P.O.:720] Out: 2525 [Urine:2525]   Physical Exam:  Cardiovascular: RRR Pulmonary: Slightly diminished at bases bilaterally; no rales, wheezes, or rhonchi. Abdomen: Soft, non tender, bowel sounds present. Extremities: Mild bilateral lower extremity edema, but is decreasing Wounds: Clean and dry.    Lab Results: CBC:  Recent Labs  03/12/13 0450 03/13/13 0500  WBC 7.9 11.0*  HGB 8.2* 8.7*  HCT 25.0* 27.0*  PLT 198 252   BMET:   Recent Labs  03/13/13 0500 03/14/13 0440  NA 136 135  K 4.0 4.8  CL 94* 95*  CO2 35* 36*  GLUCOSE 115* 118*  BUN 16 16  CREATININE 0.87 0.94  CALCIUM 8.9 9.1    PT/INR:  Lab Results  Component Value Date   INR 2.38* 03/14/2013   INR 2.42* 03/13/2013   INR 2.74* 03/12/2013   ABG:  INR: Will add last result for INR, ABG once components are confirmed Will add last 4 CBG results once components are confirmed  Assessment/Plan:  1. CV - Previously AV paced, junctional, a fib with RVR. Rhtyhm appears either accelerated junctional or SR (with low P waves) per cardiology.On Cardizem CD 120 daily, Amiodarone 400 bid,  and Coumadin. INR increased from  2.38 to 2.62.Will give Coumadin 1 mg tonight and likely send on 2 mg. 2.  Pulmonary - On 2 liters of oxygen via [place]. Will likely need O2 upon discharge.Encourage incentive spirometer.CXR shows no pneumothorax, improving aeration, and small bilateral pleural effusions. 3. Volume Overload - On Lasix 60 IV tid. 4.  Acute blood loss anemia - H and H 8.8 and 27.6 5.Supplement potassium 6.Hopefully, to SNF in am.  Daemon Dowty MPA-C 03/14/2013,8:38 AM

## 2013-03-14 NOTE — Progress Notes (Signed)
Physical Therapy Treatment Patient Details Name: Karra Pink MRN: [id number] DOB: 01-30-1936 Today's Date: 03/14/2013 Time: 4098-1191 PT Time Calculation (min): 34 min  PT Assessment / Plan / Recommendation  History of Present Illness 77 y.o. female admitted to [facility] on 02/28/13 with history of COPD, atrial fibrillation, and diastolic CHF presents with SOB.  [name] on 03/05/13.     PT Comments   Pt progressing well. Lives alone and has done short-term rehab in the past to prepare for return home alone. Needs emphasis on LE strengthening to increase independence with transfers   Follow Up Recommendations  SNF;Supervision/Assistance - 24 hour     Does the patient have the potential to tolerate intense rehabilitation     Barriers to Discharge        Equipment Recommendations  None recommended by PT    Recommendations for Other Services    Frequency Min 3X/week   Progress towards PT Goals Progress towards PT goals: Progressing toward goals  Plan Current plan remains appropriate    Precautions / Restrictions Precautions Precautions: Sternal;Fall Precaution Comments: pt able to state sternal precautions (in her own words); adheres to precautions without cues   Pertinent Vitals/Pain Denied pain SaO2 97% on 2L and at rest on RA; attempted ambulation on RA and portable pulse ox would not register, therefore resumed [place] O2 for safety    Mobility  Bed Mobility Bed Mobility: Not assessed Transfers Transfers: Sit to Stand;Stand to Sit Sit to Stand: 4: Min assist;Without upper extremity assist Stand to Sit: 4: Min guard;Without upper extremity assist Details for Transfer Assistance: pt requires assist to weight shift forward over her base of support and to extend hips/knees to achieve upright; repeated x 5 for strengthening and timing Ambulation/Gait Ambulation/Gait Assistance: 4: Min guard Ambulation Distance (Feet): 250 Feet Assistive device: Rolling  walker Ambulation/Gait Assistance Details: slow, cues to keep RW closer to her (although current RW is too tall for her); unable to get a reliable SaO2 reading on portable monitor to attempt ambulation without O2 Gait Pattern: Step-through pattern;Decreased stride length;Trunk flexed    Exercises General Exercises - Lower Extremity Ankle Circles/Pumps: AROM;Both;20 reps;Seated Quad Sets: AROM;Both;10 reps (reclined) Other Exercises Other Exercises: sit to stand x 4 consecutively (pt fatiguing and needing incr assist with each rep)   PT Diagnosis:    PT Problem List:   PT Treatment Interventions:     PT Goals (current goals can now be found in the care plan section)    Visit Information  Last PT Received On: 03/14/13 Assistance Needed: +1 History of Present Illness: 77 y.o. female admitted to [facility] on 02/28/13 with history of COPD, atrial fibrillation, and diastolic CHF presents with SOB.  [name] on 03/05/13.      Subjective Data      Cognition  Cognition Arousal/Alertness: Awake/alert Behavior During Therapy: WFL for tasks assessed/performed Overall Cognitive Status: Within Functional Limits for tasks assessed    Balance     End of Session PT - End of Session Equipment Utilized During Treatment: Gait belt;Oxygen Activity Tolerance: Patient tolerated treatment well Patient left: in chair;with call bell/phone within reach;with family/visitor present   GP     Ladislao Cohenour 03/14/2013, 1:44 PM Pager [phone]

## 2013-03-14 NOTE — Progress Notes (Signed)
CARDIAC REHAB PHASE I   PRE:  Rate/Rhythm: 80 JR    BP: sitting 128/55    SaO2: 90 RA  MODE:  Ambulation: 450 ft   POST:  Rate/Rhythm: 94 JR    BP: sitting 122/58     SaO2: 97 2L  Pt tolerated well with increased distance. Still DOE requiring rests and pursed lip breathing. SaO2 good on 2L, borderline on RA. Return to recliner. C/o pain. Encouraged IS. [phone]   Elissa Lovett [place] CES, ACSM 03/14/2013 8:35 AM

## 2013-03-14 NOTE — Progress Notes (Signed)
Patient ID: Nicole [name], female   DOB: 02/27/1936, 77 y.o.   MRN: [id number]   SUBJECTIVE: Short of breath with walking.  Continues to diurese.  Rhythm on telemetry this morning appears more organized, ?accelerated junctional.  Will get ECG to characterize.    Marland Kitchen albuterol  2.5 mg Nebulization TID   And  . ipratropium  0.5 mg Nebulization TID  . amiodarone  400 mg Oral BID  . atorvastatin  40 mg Oral QPM  . diltiazem  120 mg Oral Daily  . docusate sodium  200 mg Oral Daily  . famotidine  20 mg Oral BID  . ferrous sulfate  325 mg Oral BID WC  . furosemide  40 mg Intravenous Q8H  . multivitamin with minerals  1 tablet Oral Daily  . potassium chloride  20 mEq Oral Once  . sodium chloride  3 mL Intravenous Q12H  . warfarin  2 mg Oral q1800  . Warfarin - Physician Dosing Inpatient   Does not apply q1800      Filed Vitals:   03/13/13 1922 03/13/13 2030 03/14/13 0507 03/14/13 0531  BP: 108/60   102/52  Pulse: 79   79  Temp: 98.6 F (37 C)   98.4 F (36.9 C)  TempSrc: Oral   Oral  Resp: 16   18  Height:      Weight:   89.1 kg (196 lb 6.9 oz)   SpO2: 99% 96%  94%    Intake/Output Summary (Last 24 hours) at 03/14/13 0723 Last data filed at 03/14/13 0657  Gross per 24 hour  Intake    720 ml  Output   2525 ml  Net  -1805 ml    LABS: Basic Metabolic Panel:  Recent Labs  62/95/28 0500 03/14/13 0440  NA 136 135  K 4.0 4.8  CL 94* 95*  CO2 35* 36*  GLUCOSE 115* 118*  BUN 16 16  CREATININE 0.87 0.94  CALCIUM 8.9 9.1  MG 2.1  --    Liver Function Tests: No results found for this basename: AST, ALT, ALKPHOS, BILITOT, PROT, ALBUMIN,  in the last 72 hours No results found for this basename: LIPASE, AMYLASE,  in the last 72 hours CBC:  Recent Labs  03/12/13 0450 03/13/13 0500  WBC 7.9 11.0*  HGB 8.2* 8.7*  HCT 25.0* 27.0*  MCV 89.3 89.1  PLT 198 252   Cardiac Enzymes: No results found for this basename: CKTOTAL, CKMB, CKMBINDEX, TROPONINI,  in the last 72  hours BNP: No components found with this basename: POCBNP,  D-Dimer: No results found for this basename: DDIMER,  in the last 72 hours Hemoglobin A1C: No results found for this basename: HGBA1C,  in the last 72 hours Fasting Lipid Panel: No results found for this basename: CHOL, HDL, LDLCALC, TRIG, CHOLHDL, LDLDIRECT,  in the last 72 hours Thyroid Function Tests: No results found for this basename: TSH, T4TOTAL, FREET3, T3FREE, THYROIDAB,  in the last 72 hours Anemia Panel: No results found for this basename: VITAMINB12, FOLATE, FERRITIN, TIBC, IRON, RETICCTPCT,  in the last 72 hours  RADIOLOGY: Dg Chest 2 View  02/28/2013   CLINICAL DATA:  Shortness of breath, atrial fibrillation and rapid ventricular rate.  EXAM: CHEST - 2 VIEW  COMPARISON:  None  FINDINGS: The heart size and mediastinal contours are within normal limits. There is no evidence of pulmonary edema, consolidation, pneumothorax, nodule or pleural fluid. Degenerative changes are present in the spine.  IMPRESSION: No active disease.   Electronically  Signed   By: Irish Lack   On: 02/28/2013 16:27   Dg Chest Port 1 View  03/07/2013   CLINICAL DATA:  Post op cardiac surgery.  EXAM: PORTABLE CHEST - 1 VIEW  COMPARISON:  Multiple priors.  FINDINGS: Theone Murdoch catheter has been removed, with the right IJ vascular sheath remaining. Mediastinal/ thoracic drains have been removed. There are bibasilar opacities. Mild pulmonary vascular congestion. Small bilateral pleural effusions. No pneumothorax.  IMPRESSION: 1. Pulmonary vascular congestion. 2. Bibasilar opacities, likely representing atelectasis with underlying pleural fluid.   Electronically Signed   By: Jerene Dilling M.D.   On: 03/07/2013 08:27   Dg Chest Portable 1 View In Am  03/06/2013   CLINICAL DATA:  CORONARY ARTERIES disease. Recent CABG. Chest pain.  EXAM: PORTABLE CHEST - 1 VIEW  IMPRESSION: Tiny bilateral effusions. Normal vascularity. Edema in the upper lobes has  resolved.  COMPARISON: 03/05/2013: COMPARISON: 03/05/2013  FINDINGS:Endotracheal tube and NG tube been removed. Two chest tubes and Swan-Ganz catheter remain in place. No pneumothorax. Tiny bilateral effusions. Vascularity is normal.   Electronically Signed   By: Geanie Cooley M.D.   On: 03/06/2013 07:45   Dg Chest Portable 1 View  03/05/2013   CLINICAL DATA:  Status post aortic valvuloplasty  EXAM: PORTABLE CHEST - 1 VIEW  COMPARISON:  02/28/2013  FINDINGS: Endotracheal tube is appropriately positioned, as is the NG tube. Mediastinal drains are in place. Median sternotomy wires noted. Right IJ Swan-Ganz catheter tip terminates over the main pulmonary artery. Moderate enlargement of the cardiomediastinal silhouette is noted with central vascular congestion and cephalization of vessels which may indicate early edema/fluid overload. No pleural effusion.  IMPRESSION: Cardiomegaly with probable early interstitial edema/ volume overload.   Electronically Signed   By: Christiana Pellant M.D.   On: 03/05/2013 15:05    PHYSICAL EXAM General: Mild tachypnea Neck: JVP 12 cm, no thyromegaly or thyroid nodule.  Lungs: Dependent crackles CV: Nondisplaced PMI.  Heart regular S1/S2, no S3/S4, 2/6 SEM.  1+ ankle edema.  No carotid bruit.  Normal pedal pulses.  Abdomen: Soft, nontender, no hepatosplenomegaly, no distention.  Neurologic: Alert and oriented x 3.  Psych: Normal affect. Extremities: No clubbing or cyanosis.   TELEMETRY: Regular rhythm, difficult to tell if sinus or other.   ASSESSMENT AND PLAN: 77 yo with history of atrial fibrillation/RVR, diastolic CHF, and severe AS now s/p bioprosthetic AVR and MAZE.  1. Acute on chronic diastolic CHF: Patient remains volume overloaded and short of breath.  She is diuresing reasonably on current Lasix but still well above her baseline weight.  Push Lasix to 60 mg IV every 8 hrs.  2. Bioprosthetic AVR: Stable post-op. 3. Atrial fibrillation: s/p MAZE.  On coumadin,  INR is therapeutic.  Rhythm more organized and regular today.  Possible accelerated junctional again.  Will get 12 lead ECG.  Hold off on DCCV today, will reassess for tomorrow based on what rhythm does over the day today.    [name] 03/14/2013 7:23 AM

## 2013-03-15 LAB — PROTIME-INR: Prothrombin Time: 27.1 seconds — ABNORMAL HIGH (ref 11.6–15.2)

## 2013-03-15 LAB — BASIC METABOLIC PANEL
BUN: 16 mg/dL (ref 6–23)
Calcium: 8.8 mg/dL (ref 8.4–10.5)
Chloride: 91 mEq/L — ABNORMAL LOW (ref 96–112)
GFR calc non Af Amer: 56 mL/min — ABNORMAL LOW (ref >90)
Glucose, Bld: 119 mg/dL — ABNORMAL HIGH (ref 70–99)
Sodium: 134 mEq/L — ABNORMAL LOW (ref 135–145)

## 2013-03-15 LAB — CBC
Hemoglobin: 8.8 g/dL — ABNORMAL LOW (ref 12.0–15.0)
MCH: 28.3 pg (ref 26.0–34.0)
MCHC: 31.9 g/dL (ref 30.0–36.0)
MCV: 88.7 fL (ref 78.0–100.0)
Platelets: 262 10*3/uL (ref 150–400)
WBC: 12.4 10*3/uL — ABNORMAL HIGH (ref 4.0–10.5)

## 2013-03-15 MED ORDER — WARFARIN SODIUM 1 MG PO TABS
1.0000 mg | ORAL_TABLET | Freq: Every day | ORAL | Status: DC
  Administered 2013-03-15: 1 mg via ORAL
  Filled 2013-03-15 (×2): qty 1

## 2013-03-15 MED ORDER — WARFARIN SODIUM 2 MG PO TABS: 2.0000 mg | ORAL_TABLET | Freq: Every day | ORAL | Status: DC

## 2013-03-15 MED ORDER — POTASSIUM CHLORIDE CRYS ER 20 MEQ PO TBCR
40.0000 meq | EXTENDED_RELEASE_TABLET | Freq: Once | ORAL | Status: AC
  Administered 2013-03-15: 40 meq via ORAL
  Filled 2013-03-15: qty 2

## 2013-03-15 NOTE — Progress Notes (Addendum)
Patient ID: Nicole [name], female   DOB: 1935/11/30, 77 y.o.   MRN: [id number]   SUBJECTIVE: Short of breath with walking.  Rhythm accelerated junctional versus sinus with low amplitude P waves.  Missed 2 doses of Lasix yesterday with mildly decreased BP (SBP in 90s).    Marland Kitchen albuterol  2.5 mg Nebulization TID   And  . ipratropium  0.5 mg Nebulization TID  . amiodarone  400 mg Oral BID  . atorvastatin  40 mg Oral QPM  . diltiazem  120 mg Oral Daily  . docusate sodium  200 mg Oral Daily  . famotidine  20 mg Oral BID  . ferrous sulfate  325 mg Oral BID WC  . furosemide  60 mg Intravenous Q8H  . multivitamin with minerals  1 tablet Oral Daily  . sodium chloride  3 mL Intravenous Q12H  . warfarin  2.5 mg Oral q1800  . Warfarin - Physician Dosing Inpatient   Does not apply q1800      Filed Vitals:   03/14/13 1946 03/14/13 2139 03/15/13 0407 03/15/13 0511  BP: 108/70   111/72  Pulse: 71   79  Temp: 98.2 F (36.8 C)   98.3 F (36.8 C)  TempSrc: Oral   Oral  Resp: 16   18  Height:      Weight:   89 kg (196 lb 3.4 oz)   SpO2: 98% 97%  97%    Intake/Output Summary (Last 24 hours) at 03/15/13 0725 Last data filed at 03/14/13 1739  Gross per 24 hour  Intake   2475 ml  Output   1085 ml  Net   1390 ml    LABS: Basic Metabolic Panel:  Recent Labs  40/98/11 0500 03/14/13 0440 03/15/13 0400  NA 136 135 134*  K 4.0 4.8 3.6  CL 94* 95* 91*  CO2 35* 36* 36*  GLUCOSE 115* 118* 119*  BUN 16 16 16   CREATININE 0.87 0.94 0.96  CALCIUM 8.9 9.1 8.8  MG 2.1  --   --    Liver Function Tests: No results found for this basename: AST, ALT, ALKPHOS, BILITOT, PROT, ALBUMIN,  in the last 72 hours No results found for this basename: LIPASE, AMYLASE,  in the last 72 hours CBC:  Recent Labs  03/13/13 0500 03/15/13 0400  WBC 11.0* 12.4*  HGB 8.7* 8.8*  HCT 27.0* 27.6*  MCV 89.1 88.7  PLT 252 262   Cardiac Enzymes: No results found for this basename: CKTOTAL, CKMB, CKMBINDEX,  TROPONINI,  in the last 72 hours BNP: No components found with this basename: POCBNP,  D-Dimer: No results found for this basename: DDIMER,  in the last 72 hours Hemoglobin A1C: No results found for this basename: HGBA1C,  in the last 72 hours Fasting Lipid Panel: No results found for this basename: CHOL, HDL, LDLCALC, TRIG, CHOLHDL, LDLDIRECT,  in the last 72 hours Thyroid Function Tests: No results found for this basename: TSH, T4TOTAL, FREET3, T3FREE, THYROIDAB,  in the last 72 hours Anemia Panel: No results found for this basename: VITAMINB12, FOLATE, FERRITIN, TIBC, IRON, RETICCTPCT,  in the last 72 hours  RADIOLOGY: Dg Chest 2 View  02/28/2013   CLINICAL DATA:  Shortness of breath, atrial fibrillation and rapid ventricular rate.  EXAM: CHEST - 2 VIEW  COMPARISON:  None  FINDINGS: The heart size and mediastinal contours are within normal limits. There is no evidence of pulmonary edema, consolidation, pneumothorax, nodule or pleural fluid. Degenerative changes are present in the spine.  IMPRESSION: No active disease.   Electronically Signed   By: Irish Lack   On: 02/28/2013 16:27   Dg Chest Port 1 View  03/07/2013   CLINICAL DATA:  Post op cardiac surgery.  EXAM: PORTABLE CHEST - 1 VIEW  COMPARISON:  Multiple priors.  FINDINGS: Theone Murdoch catheter has been removed, with the right IJ vascular sheath remaining. Mediastinal/ thoracic drains have been removed. There are bibasilar opacities. Mild pulmonary vascular congestion. Small bilateral pleural effusions. No pneumothorax.  IMPRESSION: 1. Pulmonary vascular congestion. 2. Bibasilar opacities, likely representing atelectasis with underlying pleural fluid.   Electronically Signed   By: Jerene Dilling M.D.   On: 03/07/2013 08:27   Dg Chest Portable 1 View In Am  03/06/2013   CLINICAL DATA:  CORONARY ARTERIES disease. Recent CABG. Chest pain.  EXAM: PORTABLE CHEST - 1 VIEW  IMPRESSION: Tiny bilateral effusions. Normal vascularity.  Edema in the upper lobes has resolved.  COMPARISON: 03/05/2013: COMPARISON: 03/05/2013  FINDINGS:Endotracheal tube and NG tube been removed. Two chest tubes and Swan-Ganz catheter remain in place. No pneumothorax. Tiny bilateral effusions. Vascularity is normal.   Electronically Signed   By: Geanie Cooley M.D.   On: 03/06/2013 07:45   Dg Chest Portable 1 View  03/05/2013   CLINICAL DATA:  Status post aortic valvuloplasty  EXAM: PORTABLE CHEST - 1 VIEW  COMPARISON:  02/28/2013  FINDINGS: Endotracheal tube is appropriately positioned, as is the NG tube. Mediastinal drains are in place. Median sternotomy wires noted. Right IJ Swan-Ganz catheter tip terminates over the main pulmonary artery. Moderate enlargement of the cardiomediastinal silhouette is noted with central vascular congestion and cephalization of vessels which may indicate early edema/fluid overload. No pleural effusion.  IMPRESSION: Cardiomegaly with probable early interstitial edema/ volume overload.   Electronically Signed   By: Christiana Pellant M.D.   On: 03/05/2013 15:05    PHYSICAL EXAM General: Mild tachypnea Neck: JVP 12 cm, no thyromegaly or thyroid nodule.  Lungs: Dependent crackles CV: Nondisplaced PMI.  Heart regular S1/S2, no S3/S4, 2/6 SEM.  1+ ankle edema.  No carotid bruit.  Normal pedal pulses.  Abdomen: Soft, nontender, no hepatosplenomegaly, no distention.  Neurologic: Alert and oriented x 3.  Psych: Normal affect. Extremities: No clubbing or cyanosis.   TELEMETRY: Regular rhythm, difficult to tell if sinus or other.   ASSESSMENT AND PLAN: 77 yo with history of atrial fibrillation/RVR, diastolic CHF, and severe AS now s/p bioprosthetic AVR and MAZE.  1. Acute on chronic diastolic CHF: Patient remains volume overloaded and on oxygen.  She did not get her Lasix yesterday.  Would not hold Lasix doses, diurese today. 2. Bioprosthetic AVR: Stable post-op. 3. Atrial fibrillation: s/p MAZE.  On coumadin, INR is therapeutic.   Rhythm more organized and regular today.  Accelerated junctional versus sinus with low amplitude P waves.  No cardioversion.   Probably to rehab soon.    Nicole [name] 03/15/2013 7:25 AM

## 2013-03-15 NOTE — Progress Notes (Signed)
      [address]       [place] [address]             [phone]        10 Days Post-Op Procedure(s) (LRB): AORTIC VALVE REPLACEMENT (AVR) (N/A) MAZE (N/A) INTRAOPERATIVE TRANSESOPHAGEAL ECHOCARDIOGRAM (N/A) MITRAL VALVE REPAIR (MVR) (N/A)  Subjective: Patient with occasional cough, but otherwise no complaints.  Objective: Vital signs in last 24 hours: Temp:  [98.1 F (36.7 C)-98.3 F (36.8 C)] 98.3 F (36.8 C) (10/16 0511) Pulse Rate:  [71-86] 79 (10/16 0511) Cardiac Rhythm:  [-] Normal sinus rhythm;Junctional rhythm (10/16 0854) Resp:  [16-18] 18 (10/16 0511) BP: (94-111)/(43-72) 111/72 mmHg (10/16 0511) SpO2:  [94 %-98 %] 97 % (10/16 0511) Weight:  [89 kg (196 lb 3.4 oz)] 89 kg (196 lb 3.4 oz) (10/16 0407)  Pre op weight  87  kg Current Weight  03/15/13 89 kg (196 lb 3.4 oz)      Intake/Output from previous day: 10/15 0701 - 10/16 0700 In: 2475 [P.O.:2475] Out: 1085 [Urine:1085]   Physical Exam:  Cardiovascular: RRR Pulmonary: Slightly diminished at bases bilaterally; no rales, wheezes, or rhonchi. Abdomen: Soft, non tender, bowel sounds present. Extremities: Mild bilateral lower extremity edema, but is decreasing Wounds: Clean and dry.    Lab Results: CBC:  Recent Labs  03/13/13 0500 03/15/13 0400  WBC 11.0* 12.4*  HGB 8.7* 8.8*  HCT 27.0* 27.6*  PLT 252 262   BMET:   Recent Labs  03/14/13 0440 03/15/13 0400  NA 135 134*  K 4.8 3.6  CL 95* 91*  CO2 36* 36*  GLUCOSE 118* 119*  BUN 16 16  CREATININE 0.94 0.96  CALCIUM 9.1 8.8    PT/INR:  Lab Results  Component Value Date   INR 2.62* 03/15/2013   INR 2.38* 03/14/2013   INR 2.42* 03/13/2013   ABG:  INR: Will add last result for INR, ABG once components are confirmed Will add last 4 CBG results once components are confirmed  Assessment/Plan:  1. CV - Previously AV paced, junctional, a fib with RVR. Rhtyhm appears either accelerated junctional or SR (with  low P waves) per cardiology.On Cardizem CD 120 daily, Amiodarone 400 bid, and Coumadin. INR increased from  2.38 to 2.62.Will give Coumadin 1 mg tonight and likely send on 2 mg. 2.  Pulmonary - On 2 liters of oxygen via [place]. Will likely need O2 upon discharge.Encourage incentive spirometer.CXR shows no pneumothorax, improving aeration, and small bilateral pleural effusions. 3. Volume Overload - On Lasix 60 IV tid. 4.  Acute blood loss anemia - H and H 8.8 and 27.6 5.Supplement potassium 6.Hopefully, to SNF in am.  ZIMMERMAN,DONIELLE MPA-C 03/15/2013,9:35 AM

## 2013-03-15 NOTE — Progress Notes (Signed)
Pt ambulated 150 ft with RW and O2 at 2L.  Slow steady gait, no complaints, NSR with HR in 80's before and after walk.  To recliner with call bell in reach.  Will con't plan of care.

## 2013-03-15 NOTE — Progress Notes (Signed)
CSW received phone call from [facility] asking for an update on patient. CSW explained that dc may be tomorrow. Admissions stated that they should have a bed for patient tomorrow if patient is medically ready.  Maree Krabbe, MSW, Theresia Majors [phone]

## 2013-03-15 NOTE — Progress Notes (Signed)
CARDIAC REHAB PHASE I   PRE:  Rate/Rhythm: 79 JR with PVC's  BP:  Supine:   Sitting: 104/52  Standing:    SaO2: 98 2L  MODE:  Ambulation: 310 ft   POST:  Rate/Rhythm: 95 JR   BP:  Supine:   Sitting: 120/74  Standing:    SaO2: 95 2L 0900-0940 Assisted X 1 used walker and O2 2L to ambulate. Pt c/o of incisional pain this am. I was only able to get her 310 feet this am due to pain and SOB. Pt took two standing rest stops during walk. Pt back to recliner after walk with call light in reach. Reported pt's c/o of pain to her nurse.Pt need much encouragement to increase her distance walking.  Melina Copa RN 03/15/2013 9:36 AM

## 2013-03-16 LAB — BASIC METABOLIC PANEL
BUN: 16 mg/dL (ref 6–23)
Chloride: 93 mEq/L — ABNORMAL LOW (ref 96–112)
Creatinine, Ser: 1.01 mg/dL (ref 0.50–1.10)
GFR calc Af Amer: 62 mL/min — ABNORMAL LOW (ref >90)
GFR calc non Af Amer: 53 mL/min — ABNORMAL LOW (ref >90)
Glucose, Bld: 120 mg/dL — ABNORMAL HIGH (ref 70–99)

## 2013-03-16 LAB — PROTIME-INR: Prothrombin Time: 29.4 seconds — ABNORMAL HIGH (ref 11.6–15.2)

## 2013-03-16 MED ORDER — GUAIFENESIN ER 600 MG PO TB12: 600.0000 mg | ORAL_TABLET | Freq: Two times a day (BID) | ORAL | Status: DC

## 2013-03-16 MED ORDER — GUAIFENESIN ER 600 MG PO TB12
600.0000 mg | ORAL_TABLET | Freq: Two times a day (BID) | ORAL | Status: DC
  Administered 2013-03-16: 600 mg via ORAL
  Filled 2013-03-16 (×2): qty 1

## 2013-03-16 MED ORDER — FUROSEMIDE 40 MG PO TABS: 40.0000 mg | ORAL_TABLET | Freq: Two times a day (BID) | ORAL | Status: DC

## 2013-03-16 MED ORDER — GUAIFENESIN ER 600 MG PO TB12: 1200.0000 mg | ORAL_TABLET | Freq: Two times a day (BID) | ORAL | Status: DC

## 2013-03-16 MED ORDER — POTASSIUM CHLORIDE CRYS ER 20 MEQ PO TBCR
40.0000 meq | EXTENDED_RELEASE_TABLET | Freq: Once | ORAL | Status: AC
  Administered 2013-03-16: 40 meq via ORAL
  Filled 2013-03-16: qty 2

## 2013-03-16 MED ORDER — AMIODARONE HCL 200 MG PO TABS: 400.0000 mg | ORAL_TABLET | Freq: Two times a day (BID) | ORAL | Status: DC

## 2013-03-16 MED ORDER — WARFARIN SODIUM 1 MG PO TABS: 1.0000 mg | ORAL_TABLET | Freq: Every day | ORAL | Status: DC

## 2013-03-16 MED ORDER — WARFARIN SODIUM 2 MG PO TABS: 2.0000 mg | ORAL_TABLET | Freq: Every day | ORAL | Status: DC

## 2013-03-16 MED ORDER — POTASSIUM CHLORIDE CRYS ER 20 MEQ PO TBCR: 20.0000 meq | EXTENDED_RELEASE_TABLET | Freq: Two times a day (BID) | ORAL | Status: DC

## 2013-03-16 NOTE — Progress Notes (Signed)
Clinical Social Worker facilitated patient discharge by contacting the patient, family and facility, [facility] in [place], [place]. Patient agreeable to this plan and arranging transport via daughter . CSW will sign off, as social work intervention is no longer needed.  Maree Krabbe, MSW, [facility]ia Majors [phone]

## 2013-03-16 NOTE — Progress Notes (Signed)
Patient Name: Nicole [name] Date of Encounter: 03/16/2013  Principal Problem:   Acute on chronic diastolic HF (heart failure) Active Problems:   Atrial fibrillation   Aortic stenosis    SUBJECTIVE: Feels a little better, thinks she got rid of fluid last night.  OBJECTIVE Filed Vitals:   03/15/13 2231 03/15/13 2242 03/16/13 0532 03/16/13 0643  BP: 105/56  112/72 113/51  Pulse:   100   Temp:   98.3 F (36.8 C)   TempSrc:   Oral   Resp:   18   Height:      Weight:   196 lb 13.9 oz (89.3 kg)   SpO2:  97% 100%     Intake/Output Summary (Last 24 hours) at 03/16/13 0735 Last data filed at 03/16/13 0704  Gross per 24 hour  Intake    720 ml  Output   1450 ml  Net   -730 ml   Filed Weights   03/14/13 0507 03/15/13 0407 03/16/13 0532  Weight: 196 lb 6.9 oz (89.1 kg) 196 lb 3.4 oz (89 kg) 196 lb 13.9 oz (89.3 kg)    . albuterol  2.5 mg Nebulization TID   And  . ipratropium  0.5 mg Nebulization TID  . amiodarone  400 mg Oral BID  . atorvastatin  40 mg Oral QPM  . diltiazem  120 mg Oral Daily  . docusate sodium  200 mg Oral Daily  . famotidine  20 mg Oral BID  . ferrous sulfate  325 mg Oral BID WC  . furosemide  60 mg Intravenous Q8H  . multivitamin with minerals  1 tablet Oral Daily  . potassium chloride  40 mEq Oral Once  . sodium chloride  3 mL Intravenous Q12H  . warfarin  1 mg Oral q1800  . Warfarin - Physician Dosing Inpatient   Does not apply q1800    PHYSICAL EXAM General: Well developed, well nourished, female SOB with minimal exertion Head: Normocephalic, atraumatic.  Neck: Supple without bruits, JVD at 10 cm Lungs:  Resp regular and unlabored, decreased BS bases with rales.. Heart: RRR, S1, S2, no S3, S4, 2/6 murmur; no rub. Abdomen: Soft, non-tender, non-distended, BS + x 4.  Extremities: No clubbing, cyanosis, 1-2+ edema.  Neuro: Alert and oriented X 3. Moves all extremities spontaneously. Psych: Normal affect.  LABS: CBC: Recent Labs  03/15/13 0400  WBC 12.4*  HGB 8.8*  HCT 27.6*  MCV 88.7  PLT 262   INR: Recent Labs  03/16/13 0554  INR 2.91*   Basic Metabolic Panel: Recent Labs  03/15/13 0400 03/16/13 0554  NA 134* 136  K 3.6 3.9  CL 91* 93*  CO2 36* 35*  GLUCOSE 119* 120*  BUN 16 16  CREATININE 0.96 1.01  CALCIUM 8.8 8.6   BNP: Pro B Natriuretic peptide (BNP)  Date/Time Value Range Status  02/28/2013 11:00 AM 1549.0* 0 - 450 pg/mL Final   TELE:        ECG:   Radiology/Studies: Dg Chest 2 View 03/14/2013   CLINICAL DATA:  Status post aortic valve replacement. Shortness of breath.  EXAM: CHEST  2 VIEW  COMPARISON:  03/07/2013.  FINDINGS: Stable cardiomegaly. Aortic valve replacement and mitral annuloplasty. Left atrial appendage occluder.  Improved lung aeration, with better seen diaphragms. There are still small pleural effusions. No edema or infiltrate. Unchanged appearance of the right apex; no enlarging pneumothorax.  IMPRESSION: 1. Improved lung aeration compared to 03/07/2013 2. Small pleural effusions.   Electronically Signed  By: Tiburcio Pea M.D.   On: 03/14/2013 05:12     Current Medications:  . albuterol  2.5 mg Nebulization TID   And  . ipratropium  0.5 mg Nebulization TID  . amiodarone  400 mg Oral BID  . atorvastatin  40 mg Oral QPM  . diltiazem  120 mg Oral Daily  . docusate sodium  200 mg Oral Daily  . famotidine  20 mg Oral BID  . ferrous sulfate  325 mg Oral BID WC  . furosemide  60 mg Intravenous Q8H  . multivitamin with minerals  1 tablet Oral Daily  . sodium chloride  3 mL Intravenous Q12H  . warfarin  1 mg Oral q1800  . Warfarin - Physician Dosing Inpatient   Does not apply q1800      ASSESSMENT AND PLAN: Principal Problem:   Acute on chronic diastolic HF (heart failure) - Wt trending up slightly and I/O incomplete (no intake recorded after 2pm). Pt rec'd all 3 doses IV Lasix yesterday. Cr up slightly, BUN unchanged. SBP borderline, so will leave med changes  to MD.   Otherwise, per TCTS. Pt is for Tx to SNF today on Lasix 40 mg PO QD. Active Problems:   Atrial fibrillation   Aortic stenosis  Signed, Theodore Demark , PA-C 7:35 AM 03/16/2013  Patient seen with PA, agree with the above note.  Still volume overloaded, slow progress.  Rhythm remains accelated junctional versus sinus with low amplitude P waves.  Plan to go to rehab today.  Would send her home on: diltiazem 120 mg daily, warfarin, amiodarone 200 mg bid, Lasix 40 mg po bid, atorvastatin 40 daily, and KCl 20 mEq bid.  I need to see her in the office in 7-10 days (can be in [place] office or CHF clinic).   [name] 03/16/2013 8:42 AM

## 2013-03-16 NOTE — Progress Notes (Addendum)
      [address]       [place] [address]             [phone]        11 Days Post-Op Procedure(s) (LRB): AORTIC VALVE REPLACEMENT (AVR) (N/A) MAZE (N/A) INTRAOPERATIVE TRANSESOPHAGEAL ECHOCARDIOGRAM (N/A) MITRAL VALVE REPAIR (MVR) (N/A)  Subjective: Patient without specific complaints this am.  Objective: Vital signs in last 24 hours: Temp:  [98.3 F (36.8 C)-98.7 F (37.1 C)] 98.3 F (36.8 C) (10/17 0532) Pulse Rate:  [75-100] 100 (10/17 0532) Cardiac Rhythm:  [-] Normal sinus rhythm (10/16 2027) Resp:  [18] 18 (10/17 0532) BP: (90-113)/(51-72) 113/51 mmHg (10/17 0643) SpO2:  [92 %-100 %] 100 % (10/17 0532) Weight:  [89.3 kg (196 lb 13.9 oz)] 89.3 kg (196 lb 13.9 oz) (10/17 0532)  Pre op weight  87  kg Current Weight  03/16/13 89.3 kg (196 lb 13.9 oz)      Intake/Output from previous day: 10/16 0701 - 10/17 0700 In: 720 [P.O.:720] Out: 750 [Urine:750]   Physical Exam:  Cardiovascular: RRR Pulmonary: Slightly diminished at bases bilaterally; no rales, wheezes, or rhonchi. Abdomen: Soft, non tender, bowel sounds present. Extremities: Mild bilateral lower extremity edema, but is decreasing Wounds: Clean and dry.    Lab Results: CBC:  Recent Labs  03/15/13 0400  WBC 12.4*  HGB 8.8*  HCT 27.6*  PLT 262   BMET:   Recent Labs  03/14/13 0440 03/15/13 0400  NA 135 134*  K 4.8 3.6  CL 95* 91*  CO2 36* 36*  GLUCOSE 118* 119*  BUN 16 16  CREATININE 0.94 0.96  CALCIUM 9.1 8.8    PT/INR:  Lab Results  Component Value Date   INR 2.91* 03/16/2013   INR 2.62* 03/15/2013   INR 2.38* 03/14/2013   ABG:  INR: Will add last result for INR, ABG once components are confirmed Will add last 4 CBG results once components are confirmed  Assessment/Plan:  1. CV - Previously AV paced, junctional, a fib with RVR. Rhtyhm of late appears either accelerated junctional or SR (with low P waves) per cardiology.On Cardizem CD 120 daily,  Amiodarone 400 bid, and Coumadin. INR increased from  2.62 to 2.91.Will hold Coumadin tonight and send on 2 mg 2.  Pulmonary - On 2 liters of oxygen via [place]. Will  need O2 upon discharge.Encourage incentive spirometer.. 3. Volume Overload - On Lasix 60 IV tid. Will send to SNF on 40 orally daily. 4.  Acute blood loss anemia - H and H 8.8 and 27.6 6.To SNF today.  ZIMMERMAN,DONIELLE MPA-C 03/16/2013,7:27 AM   Chart reviewed, patient examined, agree with above.

## 2013-03-16 NOTE — Progress Notes (Signed)
CARDIAC REHAB PHASE I   PRE:  Rate/Rhythm: 33 JR with PVC's  BP:  Supine:   Sitting: 101/47  Standing:    SaO2: 100 3L 89 RA  MODE:  Ambulation: 550 ft   POST:  Rate/Rhythm: 95 JR with PVC's  BP:  Supine:   Sitting: 114/43  Standing:    SaO2: 89-93 RA 0940-1045 On arrival pt on O2 3L sat 100%. O2 discontinued sat 89%. Assisted X 1 and used walker to ambulate. Gait steady with walker, pt able to walk 550 feet with several standing rest stops. RA sats during walk 89-93%. On retirn to room sat 91%. Pt back to recline after walk with call light in reach. Completed discharge education with pt and daughter. They voice understanding. Pt agrees to [facility]. CRP in [place], will send referral.  Melina Copa RN 03/16/2013 10:48 AM

## 2013-03-16 NOTE — Progress Notes (Signed)
Clinical Social Work Department CLINICAL SOCIAL WORK PLACEMENT NOTE 03/16/2013  Patient:  Nicole [name],Nicole [name]  Account Number:  [id number] Admit date:  02/28/2013  Clinical Social Worker:  Carren Rang  Date/time:  03/09/2013 02:29 PM  Clinical Social Work is seeking post-discharge placement for this patient at the following level of care:   SKILLED NURSING   (*CSW will update this form in Epic as items are completed)   03/09/2013  Patient/family provided with [facility] Department of Clinical Social Work's list of facilities offering this level of care within the geographic area requested by the patient (or if unable, by the patient's family).  03/09/2013  Patient/family informed of their freedom to choose among providers that offer the needed level of care, that participate in Medicare, Medicaid or managed care program needed by the patient, have an available bed and are willing to accept the patient.  03/09/2013  Patient/family informed of [facility]' ownership interest in [facility], as well as of the fact that they are under no obligation to receive care at this facility.  PASARR submitted to EDS on 03/08/2013 PASARR number received from EDS on 03/08/2013  FL2 transmitted to all facilities in geographic area requested by pt/family on  03/08/2013 FL2 transmitted to all facilities within larger geographic area on   Patient informed that his/her managed care company has contracts with or will negotiate with  certain facilities, including the following:     Patient/family informed of bed offers received:  03/12/2013 Patient chooses bed at OTHER Physician recommends and patient chooses bed at    Patient to be transferred to OTHER on  03/16/2013 Patient to be transferred to facility by FAMILY  The following physician request were entered in Epic:   Additional Comments:  Maree Krabbe, MSW, [employer] [phone]

## 2013-03-19 NOTE — Anesthesia Postprocedure Evaluation (Signed)
  Anesthesia Post-op Note  Patient: Nicole [name]  Procedure(s) Performed: Procedure(s): AORTIC VALVE REPLACEMENT (AVR) (N/A) MAZE (N/A) INTRAOPERATIVE TRANSESOPHAGEAL ECHOCARDIOGRAM (N/A) MITRAL VALVE REPAIR (MVR) (N/A)  Patient Location: ICU  Anesthesia Type:General  Level of Consciousness: sedated  Airway and Oxygen Therapy: Patient remains intubated per anesthesia plan  Post-op Pain: none  Post-op Assessment: Post-op Vital signs reviewed, Patient's Cardiovascular Status Stable and Respiratory Function Stable  Post-op Vital Signs: Reviewed and stable  Complications: No apparent anesthesia complications

## 2013-03-22 ENCOUNTER — Encounter (INDEPENDENT_AMBULATORY_CARE_PROVIDER_SITE_OTHER): Payer: Self-pay

## 2013-03-22 ENCOUNTER — Ambulatory Visit (HOSPITAL_COMMUNITY)
Admission: RE | Admit: 2013-03-22 | Discharge: 2013-03-22 | Disposition: A | Discharge status: Home / Self Care | Payer: Medicare Other | Source: Ambulatory Visit | Attending: Internal Medicine | Admitting: Internal Medicine

## 2013-03-22 VITALS — BP 110/56 | HR 80 | Wt 189.8 lb

## 2013-03-22 DIAGNOSIS — I4892 Unspecified atrial flutter: Secondary | ICD-10-CM | POA: Insufficient documentation

## 2013-03-22 DIAGNOSIS — I359 Nonrheumatic aortic valve disorder, unspecified: Secondary | ICD-10-CM | POA: Insufficient documentation

## 2013-03-22 DIAGNOSIS — I4891 Unspecified atrial fibrillation: Secondary | ICD-10-CM

## 2013-03-22 DIAGNOSIS — I350 Nonrheumatic aortic (valve) stenosis: Secondary | ICD-10-CM

## 2013-03-22 DIAGNOSIS — I5032 Chronic diastolic (congestive) heart failure: Secondary | ICD-10-CM | POA: Insufficient documentation

## 2013-03-22 DIAGNOSIS — I509 Heart failure, unspecified: Secondary | ICD-10-CM | POA: Insufficient documentation

## 2013-03-22 MED ORDER — AMIODARONE HCL 200 MG PO TABS: 200.0000 mg | ORAL_TABLET | Freq: Every day | ORAL | Status: DC

## 2013-03-22 NOTE — Addendum Note (Signed)
Encounter addended by: Noralee Space, RN on: 03/22/2013  4:44 PM<BR>     Documentation filed: Orders

## 2013-03-22 NOTE — Progress Notes (Signed)
Patient ID: Emersyn Wyss, female   DOB: [birth year], 77 y.o.   MRN: [id number] PCP: Dr. Ladona Ridgel [facility])  Ms Stolarz is a 77 yo with history of COPD (52 pack years quit April 2014), atrial fibrillation, and diastolic CHF. Daughter is [name] (one of our CCU nurses)  Here for post up f/u. Recently admitted for recurrent AF. Found to have severe AS and moderate MR. Cath with normal cors with anomalous LM (off RCC).  Underwent AV (tissue), MV repair and Maze on 03/05/13. Discharged 03/16/13. Now in rehab facility. Recovering slowly. Was able to walk 70 feet today with walker. + edema. No orthopnea or PND. Weight 197 when she left the hospital. Today 189. Taking lasix 80 bid. Poor appetite. Remains on amio 400 bid.   Had labs Monday at rehab Monday   Labs (9/14): K 4, creatinine 0.9 => 1.04, HCT 33.7, TnI 0.21 (while hospitalized in [place]), BNP 158, TSH normal, AST normal, ALT 39 03/16/13:  K 3.9 Cr 1.0  PMH: 1. COPD 2. Atrial fibrillation: First noted in 7/14.  DCCV in 8/14 to NSR.  Recurred by 9/14.  DCCV to NSR 9/14 but recurred. S/p Maze procedure 10/14 3. Hyperlipidemia 4. Diastolic CHF: Echo (9/14) with EF 55-60%, grade II diastolic dysfunction, mild LVH, moderate to severe AS with mean gradient 33 mmHg/peak 49 mmHg and AVA 0.74 cm^2.  5. Severe AS/Moderate MR. 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 Normal coronaries cath 10/14  SH: Lives in [place], retired, nonsmoker (quit 7/14).  Lives alone.  Daughter come to appointments.   FH: No premature CAD.  Breast cancer.   ROS: All systems reviewed and negative except as per HPI.   Current Outpatient Prescriptions  Medication Sig Dispense Refill  . ALPRAZolam (XANAX) 0.25 MG tablet Take 0.25 mg by mouth at bedtime as needed for sleep.      Marland Kitchen amiodarone (PACERONE) 200 MG tablet Take 2 tablets (400 mg total) by mouth 2 (two) times daily.      Marland Kitchen atorvastatin (LIPITOR) 40  MG tablet Take 40 mg by mouth every evening.       . diltiazem (CARDIZEM SR) 120 MG 12 hr capsule Take 1 capsule (120 mg total) by mouth daily.      . famotidine (PEPCID) 20 MG tablet Take 1 tablet (20 mg total) by mouth 2 (two) times daily. May stop after discharge from SNF.      . ferrous sulfate 325 (65 FE) MG tablet Take 1 tablet (325 mg total) by mouth daily with breakfast. For one month then stop.    3  . furosemide (LASIX) 40 MG tablet Take 1 tablet (40 mg total) by mouth 2 (two) times daily.  30 tablet  3  . guaiFENesin (MUCINEX) 600 MG 12 hr tablet Take 1 tablet (600 mg total) by mouth 2 (two) times daily.      Marland Kitchen ipratropium-albuterol (DUONEB) 0.5-2.5 (3) MG/3ML SOLN Take 3 mLs by nebulization 3 (three) times daily.       . Multiple Vitamins-Minerals (MULTIVITAMIN WITH MINERALS) tablet Take 1 tablet by mouth daily.      . potassium chloride SA (K-DUR,KLOR-CON) 20 MEQ tablet Take 1 tablet (20 mEq total) by mouth 2 (two) times daily.      . traMADol (ULTRAM) 50 MG tablet Take 1 tablet (50 mg total) by mouth every 4 (four) hours as needed for pain.  30 tablet  0  .  warfarin (COUMADIN) 2 MG tablet Take 1 tablet (2 mg total) by mouth daily at 6 PM.       No current facility-administered medications for this encounter.    BP 110/56  Pulse 80  Wt 189 lb 12 oz (86.07 kg)  BMI 33.62 kg/m2  SpO2 91% General: NAD Neck: JVP 8 cm prominent CV waves no thyromegaly or thyroid nodule.  Lungs: decreased throughout. Clear CV: Chest scar looks good. RRR. S2 crisp. 2/6 SEM at RSB. 2/6 SEM at LSB  No MR. No carotid bruit.  Normal pedal pulses.  Abdomen: obese. soft, nontender, no hepatosplenomegaly, no distention.  Skin: Intact without lesions or rashes.  Neurologic: Alert and oriented x 3.  Psych: Normal affect. Extremities: No clubbing or cyanosis. 2+ edema calf down bilaterally  HEENT: Normal.   ECG: NSR 77 non-specific T wave flattening  Assessment/Plan: 1. Valvular heart disease - Severe  aortic stenosis & moderate MR - s/p AVR (tissue) and MV ring with Maze procedure 03/15/13. Continue coumadin 2. Atrial fibrillation - s/p Maze. Remains in NSR. Continue coumadin. Cut amio to 400 daily 3. Chronic diastolic CHF: she has mild edema. Continue lasix 40 bid. Can take an extra dose as needed.  Place compression stockings. 4. COPD - stble 5. Anomalous LM with normal cors on cath  Overall doing very well. Continue rehab. Will need cardiac rehab program when she gets out of rehab facility. Will see back in 4 weeks. Has f/u with Dr. Laneta Simmers.   Holly Bodily 03/22/2013

## 2013-03-23 NOTE — Addendum Note (Signed)
Encounter addended by: Ernestina Penna on: 03/23/2013  8:35 AM<BR>     Documentation filed: Charges VN

## 2013-03-26 ENCOUNTER — Encounter (HOSPITAL_COMMUNITY): Payer: Medicare Other

## 2013-04-09 ENCOUNTER — Other Ambulatory Visit: Payer: Self-pay | Admitting: *Deleted

## 2013-04-09 DIAGNOSIS — I350 Nonrheumatic aortic (valve) stenosis: Secondary | ICD-10-CM

## 2013-04-11 ENCOUNTER — Ambulatory Visit (INDEPENDENT_AMBULATORY_CARE_PROVIDER_SITE_OTHER): Payer: Self-pay | Admitting: Surgery

## 2013-04-11 ENCOUNTER — Encounter: Payer: Self-pay | Admitting: Surgery

## 2013-04-11 ENCOUNTER — Ambulatory Visit
Admission: RE | Admit: 2013-04-11 | Discharge: 2013-04-11 | Disposition: A | Discharge status: Home / Self Care | Payer: Medicare Other | Source: Ambulatory Visit | Attending: Surgery | Admitting: Surgery

## 2013-04-11 VITALS — BP 136/80 | HR 90 | Resp 20 | Ht 63.0 in | Wt 180.0 lb

## 2013-04-11 DIAGNOSIS — I340 Nonrheumatic mitral (valve) insufficiency: Secondary | ICD-10-CM

## 2013-04-11 DIAGNOSIS — I4891 Unspecified atrial fibrillation: Secondary | ICD-10-CM

## 2013-04-11 DIAGNOSIS — Z954 Presence of other heart-valve replacement: Secondary | ICD-10-CM

## 2013-04-11 DIAGNOSIS — I480 Paroxysmal atrial fibrillation: Secondary | ICD-10-CM

## 2013-04-11 DIAGNOSIS — Z952 Presence of prosthetic heart valve: Secondary | ICD-10-CM

## 2013-04-11 DIAGNOSIS — J90 Pleural effusion, not elsewhere classified: Secondary | ICD-10-CM

## 2013-04-11 DIAGNOSIS — I359 Nonrheumatic aortic valve disorder, unspecified: Secondary | ICD-10-CM

## 2013-04-11 DIAGNOSIS — Z9889 Other specified postprocedural states: Secondary | ICD-10-CM

## 2013-04-11 DIAGNOSIS — I350 Nonrheumatic aortic (valve) stenosis: Secondary | ICD-10-CM

## 2013-04-11 DIAGNOSIS — I059 Rheumatic mitral valve disease, unspecified: Secondary | ICD-10-CM

## 2013-04-11 DIAGNOSIS — Z8679 Personal history of other diseases of the circulatory system: Secondary | ICD-10-CM

## 2013-04-11 NOTE — Progress Notes (Signed)
HPI:  Patient returns for routine postoperative follow-up having undergone aortic valve replacement with a pericardial valve, mitral annuloplasty, MAZE procedure and clipping of the left atrial appendage on 03/05/2013. The patient's early postoperative recovery while in the hospital was notable for a slow postoperative course. Since hospital discharge the patient reports that she has been feeling much better. She has been doing rehab at a skilled nursing facility in [place]. She is scheduled to return home next week. She did have problems with fluid overload immediately after discharge but this responded to increased diuretics.   Current Outpatient Prescriptions  Medication Sig Dispense Refill  . ALPRAZolam (XANAX) 0.25 MG tablet Take 0.25 mg by mouth at bedtime as needed for sleep.      Marland Kitchen amiodarone (PACERONE) 200 MG tablet Take 1 tablet (200 mg total) by mouth daily.      Marland Kitchen atorvastatin (LIPITOR) 40 MG tablet Take 40 mg by mouth every evening.       . bisacodyl (DULCOLAX) 10 MG suppository Place 10 mg rectally as needed for constipation.      Marland Kitchen diltiazem (CARDIZEM SR) 120 MG 12 hr capsule Take 120 mg by mouth 2 (two) times daily.      . ferrous sulfate 325 (65 FE) MG tablet Take 1 tablet (325 mg total) by mouth daily with breakfast. For one month then stop.    3  . furosemide (LASIX) 40 MG tablet Take 1 tablet (40 mg total) by mouth 2 (two) times daily.  30 tablet  3  . ipratropium-albuterol (DUONEB) 0.5-2.5 (3) MG/3ML SOLN Take 3 mLs by nebulization 3 (three) times daily.       . magnesium hydroxide (MILK OF MAGNESIA) 400 MG/5ML suspension Take 30 mLs by mouth daily as needed for constipation.      . Multiple Vitamins-Minerals (MULTIVITAMIN WITH MINERALS) tablet Take 1 tablet by mouth daily.      . polyethylene glycol (MIRALAX / GLYCOLAX) packet Take 17 g by mouth daily.      . potassium chloride SA (K-DUR,KLOR-CON) 20 MEQ tablet Take 20 mEq by mouth daily.      . traMADol (ULTRAM)  50 MG tablet Take 1 tablet (50 mg total) by mouth every 4 (four) hours as needed for pain.  30 tablet  0  . warfarin (COUMADIN) 2 MG tablet Take 1 tablet (2 mg total) by mouth daily at 6 PM.       No current facility-administered medications for this visit.    Physical Exam: BP 136/80  Pulse 90  Resp 20  Ht 5\' 3"  (1.6 m)  Wt 180 lb (81.647 kg)  BMI 31.89 kg/m2  SpO2 93% She looks well Cardiac exam reveals a regular rate and rhythm with normal valve sounds. Lung exam is clear The chest incision is healing well and the sternum is stable There is no peripheral edema.  Diagnostic Tests:  CLINICAL DATA: History of heart surgery with shortness of breath  EXAM:  CHEST 2 VIEW  COMPARISON: 03/14/2013  FINDINGS:  Stable mild cardiomegaly. Vascular pattern is normal. Stable median  sternotomy with evidence replaced cardiac valve. The left lung is  clear and there is no left pleural effusion. On the right side,  there is a mildly increased small pleural effusion with underlying  passive atelectasis. The right lung is otherwise clear.  IMPRESSION:  Mildly increased right pleural effusion  Electronically Signed  By: Esperanza Heir M.D.  On: 04/11/2013 10:58    Impression:  Overall I think  she making a great recovery and has markedly improved since discharge a few weeks ago. She should be fine to return home next week and wants to participate in cardiac rehab. She still has a small pleural effusion but it should resolve with diuresis. I told her she can return to driving but should not lift anything heavier than 10 pounds for 3 months postoperatively.  Plan:  She will continue to followup with Dr. Shirlee Latch and will contact me if she develops any problems with her incisions.

## 2013-05-17 ENCOUNTER — Ambulatory Visit (HOSPITAL_COMMUNITY)
Admission: RE | Admit: 2013-05-17 | Discharge: 2013-05-17 | Disposition: A | Discharge status: Home / Self Care | Payer: Medicare Other | Source: Ambulatory Visit | Attending: Internal Medicine | Admitting: Internal Medicine

## 2013-05-17 ENCOUNTER — Encounter (HOSPITAL_COMMUNITY): Payer: Self-pay

## 2013-05-17 VITALS — BP 120/68 | HR 93 | Wt 188.8 lb

## 2013-05-17 DIAGNOSIS — J4489 Other specified chronic obstructive pulmonary disease: Secondary | ICD-10-CM | POA: Insufficient documentation

## 2013-05-17 DIAGNOSIS — I5032 Chronic diastolic (congestive) heart failure: Secondary | ICD-10-CM

## 2013-05-17 DIAGNOSIS — J449 Chronic obstructive pulmonary disease, unspecified: Secondary | ICD-10-CM | POA: Insufficient documentation

## 2013-05-17 DIAGNOSIS — I4891 Unspecified atrial fibrillation: Secondary | ICD-10-CM

## 2013-05-17 DIAGNOSIS — I509 Heart failure, unspecified: Secondary | ICD-10-CM | POA: Insufficient documentation

## 2013-05-17 MED ORDER — RIVAROXABAN 20 MG PO TABS: 20.0000 mg | ORAL_TABLET | Freq: Every day | ORAL | Status: DC

## 2013-05-17 MED ORDER — FUROSEMIDE 40 MG PO TABS: 40.0000 mg | ORAL_TABLET | Freq: Two times a day (BID) | ORAL | Status: DC

## 2013-05-17 MED ORDER — DILTIAZEM HCL ER 120 MG PO CP12: 120.0000 mg | ORAL_CAPSULE | Freq: Two times a day (BID) | ORAL | Status: DC

## 2013-05-17 NOTE — Progress Notes (Signed)
Patient ID: Nicole [name], female   DOB: 1935-09-28, 77 y.o.   MRN: [id number]  PCP: Dr. Ladona Ridgel [facility])  Nicole [name] is a 77 yo with history of COPD (52 pack years quit April 2014), atrial fibrillation s/p AVR with pericardial valve, mitral annuloplasty, MAZE procedure and clipping of the LA appendage (02/2013) and diastolic CHF. Daughter is Nicole [name] (one of our CCU nurses)  Here for post up f/u. Recently admitted for recurrent AF. Found to have severe AS and moderate MR. Cath with normal cors with anomalous LM (off RCC).  Follow up: Last visit cut amiodarone back to 200 mg daily. Now home from rehab facility. Doing well. Exercising about 3x a week either walking or on stationary bike. Has been having issues with INR being too high. SOB is much improved since surgery. Can walk around the grocery store and go upstairs without stopping d/t SOB. Daughter is concerned she is still winded, but related to COPD. Weight at home 188 lbs. Denies CP, palpitations, orthopnea or PND. Trying to follow a low salt diet and drinking less than 2 liters a day.   Labs (9/14): K 4, creatinine 0.9 => 1.04, HCT 33.7, TnI 0.21 (while hospitalized in [place]), BNP 158, TSH normal, AST normal, ALT 39 03/16/13:  K 3.9 Cr 1.0  PMH: 1. COPD 2. Atrial fibrillation: First noted in 7/14.  DCCV in 8/14 to NSR.  Recurred by 9/14.  DCCV to NSR 9/14 but recurred. S/p Maze procedure 10/14 3. Hyperlipidemia 4. Diastolic CHF: Echo (9/14) with EF 55-60%, grade II diastolic dysfunction, mild LVH, moderate to severe AS with mean gradient 33 mmHg/peak 49 mmHg and AVA 0.74 cm^2.  5. Severe AS/Moderate MR. 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 Normal coronaries cath 10/14  SH: Lives in [place], retired, nonsmoker (quit 7/14).  Lives alone.  Daughter come to appointments.   FH: No premature CAD.  Breast cancer.   ROS: All systems reviewed and negative except as per  HPI.   Current Outpatient Prescriptions  Medication Sig Dispense Refill  . ALPRAZolam (XANAX) 0.25 MG tablet Take 0.25 mg by mouth at bedtime as needed for sleep.      Marland Kitchen amiodarone (PACERONE) 200 MG tablet Take 1 tablet (200 mg total) by mouth daily.      Marland Kitchen atorvastatin (LIPITOR) 40 MG tablet Take 40 mg by mouth every evening.       . diltiazem (CARDIZEM SR) 120 MG 12 hr capsule Take 120 mg by mouth 2 (two) times daily.      . furosemide (LASIX) 40 MG tablet Take 40 mg by mouth daily.      Marland Kitchen ipratropium-albuterol (DUONEB) 0.5-2.5 (3) MG/3ML SOLN Take 3 mLs by nebulization 3 (three) times daily.       . Multiple Vitamins-Minerals (MULTIVITAMIN WITH MINERALS) tablet Take 1 tablet by mouth daily.      . potassium chloride SA (K-DUR,KLOR-CON) 20 MEQ tablet Take 20 mEq by mouth daily.      Marland Kitchen warfarin (COUMADIN) 2 MG tablet Take 1 tablet (2 mg total) by mouth daily at 6 PM.       No current facility-administered medications for this encounter.    Filed Vitals:   05/17/13 1527  BP: 120/68  Pulse: 93  Weight: 188 lb 12.8 oz (85.639 kg)  SpO2: 96%   Physical Exam: General: NAD Neck: JVP 8-9 cm prominent CV waves no thyromegaly or thyroid nodule.  Lungs: decreased throughout. Clear CV: Chest scar looks good. RRR. S2 crisp. 2/6 SEM at RSB. 2/6 SEM at LSB  No MR. No carotid bruit.  Normal pedal pulses.  Abdomen: obese. soft, nontender, no hepatosplenomegaly, no distention.  Skin: Intact without lesions or rashes.  Neurologic: Alert and oriented x 3.  Psych: Normal affect. Extremities: No clubbing or cyanosis. 2+ edema calf down bilaterally  HEENT: Normal.   Assessment/Plan:  1. Valvular heart disease - Severe aortic stenosis & moderate MR - s/p AVR (tissue) and MV ring with Maze procedure 03/15/13. Will continue coumadin for now, however discussed with family if they would like to switch to Xarelto off label it is ok with Dr. Gala Romney. Will send prescription for Xarelto 20 mg daily.  2.  Atrial fibrillation - s/p Maze. Remains in NSR. Will stop amiodarone today and then in a month will place event monitor for a month and if no afib can stop all anticoagulation.  3. Chronic diastolic CHF:  - NYHA II-III symptoms. Volume status is elevated will increase lasix to 40 mg BID. Instructed to call if weight does not start trending down if she starts trending up. Discussed the use of sliding scale diuretics.  Will get BMET and pro- BNP next week to assess renal function and potassium -Reinforced the need and importance of daily weights, a low sodium diet, and fluid restriction (less than 2 L a day). Instructed to call the HF clinic if weight increases more than 3 lbs overnight or 5 lbs in a week.  4. COPD  - stable. She no longer is smoking and congratulated her on her continued abstinence  F/U 2-3 months.  Nicole [name] B NP-C 3:52 PM  Patient seen and examined with Nicole Potash, NP. We discussed all aspects of the encounter. I agree with the assessment and plan as stated above.   She is improving steadily. Still with mild edema and agree with increasing lasix. Reinforced need for daily weights and reviewed use of sliding scale diuretics as well. Follow renal function. We had long talk about management of AF and anti-coagulation s/p Maze. Explained residual risk of stroke despite Maze and LAA exclusion to her and her daughter Nicole [name] - CCU nurse). We will stop amiodarone and wait a month before placing 30-day event monitor to surveil for AF. If no AF can consider stopping anticoagulation. She has had difficulty regulating INR. I would be fine with NOAC but we did discuss that presence of valvular abnormalities makes this off-label use.   Nicole Shuford,MD 11:36 AM

## 2013-05-17 NOTE — Patient Instructions (Addendum)
Increase your lasix to 40 mg (2 tablets) daily.  Go next week to get BMET or pro-BNP checked and fax results to [phone]  Will set up for event monitor in a month.   Can switch back to Xarelto 20 mg daily. Stop taking Coumdain today and get INR checked Monday and then start daily.  Have a wonderful Christmas and New Years  Follow up in 2-3 months  Do the following things EVERYDAY: 1) Weigh yourself in the morning before breakfast. Write it down and keep it in a log. 2) Take your medicines as prescribed 3) Eat low salt foods-Limit salt (sodium) to 2000 mg per day.  4) Stay as active as you can everyday 5) Limit all fluids for the day to less than 2 liters 6)

## 2013-06-06 DIAGNOSIS — M62838 Other muscle spasm: Secondary | ICD-10-CM | POA: Diagnosis not present

## 2013-06-06 DIAGNOSIS — M549 Dorsalgia, unspecified: Secondary | ICD-10-CM | POA: Diagnosis not present

## 2013-06-19 ENCOUNTER — Telehealth (HOSPITAL_COMMUNITY): Payer: Self-pay | Admitting: *Deleted

## 2013-06-19 ENCOUNTER — Encounter: Payer: Self-pay | Admitting: *Deleted

## 2013-06-19 NOTE — Progress Notes (Signed)
Patient ID: Nicole [name], female   DOB: December 13, 1935, 78 y.o.   MRN: [id number] Patient enrolled for [employer] to mail a 30 day cardiac event monitor to the patients home.

## 2013-06-19 NOTE — Telephone Encounter (Signed)
Received call from pt's daughter [name], she states pt was unable to get to appt yesterday to get monitor and since they live in [place] can be hard to get pt here, have called [facility] and spoke w/Shelley in holter room, she will place order with [employer] and they can mail monitor out to pt, [name] is aware and agreeable

## 2013-06-22 ENCOUNTER — Encounter (INDEPENDENT_AMBULATORY_CARE_PROVIDER_SITE_OTHER): Payer: Medicare Other

## 2013-06-22 ENCOUNTER — Encounter: Payer: Self-pay | Admitting: Internal Medicine

## 2013-06-22 DIAGNOSIS — I4891 Unspecified atrial fibrillation: Secondary | ICD-10-CM

## 2013-07-24 DIAGNOSIS — M5380 Other specified dorsopathies, site unspecified: Secondary | ICD-10-CM | POA: Diagnosis not present

## 2013-07-24 DIAGNOSIS — M5000 Cervical disc disorder with myelopathy, unspecified cervical region: Secondary | ICD-10-CM | POA: Diagnosis not present

## 2013-07-24 DIAGNOSIS — M546 Pain in thoracic spine: Secondary | ICD-10-CM | POA: Diagnosis not present

## 2013-07-24 DIAGNOSIS — M545 Low back pain, unspecified: Secondary | ICD-10-CM | POA: Diagnosis not present

## 2013-07-24 DIAGNOSIS — M5137 Other intervertebral disc degeneration, lumbosacral region: Secondary | ICD-10-CM | POA: Diagnosis not present

## 2013-07-24 DIAGNOSIS — M549 Dorsalgia, unspecified: Secondary | ICD-10-CM | POA: Diagnosis not present

## 2013-07-31 DIAGNOSIS — M545 Low back pain, unspecified: Secondary | ICD-10-CM | POA: Diagnosis not present

## 2013-07-31 DIAGNOSIS — M5137 Other intervertebral disc degeneration, lumbosacral region: Secondary | ICD-10-CM | POA: Diagnosis not present

## 2013-08-01 ENCOUNTER — Telehealth (HOSPITAL_COMMUNITY): Payer: Self-pay | Admitting: Cardiology

## 2013-08-01 NOTE — Telephone Encounter (Signed)
Please call with cardiac monitor results

## 2013-08-02 NOTE — Telephone Encounter (Signed)
Left message to call back  

## 2013-08-03 NOTE — Telephone Encounter (Signed)
Per Dr Haroldine Laws: probable SR w/dimunitive p-waves (similar to 12 lead ekg) can not exlude junctional rhythm, PVCs/PACs, no a-fib, ok to d/c Xarelto, attempted to call pt again and Left message to call back

## 2013-08-14 NOTE — Telephone Encounter (Signed)
Pt's daughter aware on 3/7

## 2013-08-15 DIAGNOSIS — IMO0002 Reserved for concepts with insufficient information to code with codable children: Secondary | ICD-10-CM | POA: Diagnosis not present

## 2013-08-15 DIAGNOSIS — M546 Pain in thoracic spine: Secondary | ICD-10-CM | POA: Diagnosis not present

## 2013-08-17 DIAGNOSIS — M549 Dorsalgia, unspecified: Secondary | ICD-10-CM | POA: Diagnosis not present

## 2013-08-17 DIAGNOSIS — F4323 Adjustment disorder with mixed anxiety and depressed mood: Secondary | ICD-10-CM | POA: Diagnosis not present

## 2013-08-20 ENCOUNTER — Telehealth (HOSPITAL_COMMUNITY): Payer: Self-pay | Admitting: Cardiology

## 2013-08-20 NOTE — Telephone Encounter (Signed)
Spoke with pts daughter [name] Attempting to schedule follow up Per [name], pt will be following up with cardiology office in [place] This office is a lot closer for them to get to and per Dr.Bensimhon this is ok

## 2013-08-21 DIAGNOSIS — M549 Dorsalgia, unspecified: Secondary | ICD-10-CM | POA: Diagnosis not present

## 2013-08-27 DIAGNOSIS — I4891 Unspecified atrial fibrillation: Secondary | ICD-10-CM | POA: Diagnosis not present

## 2013-08-27 DIAGNOSIS — I10 Essential (primary) hypertension: Secondary | ICD-10-CM | POA: Diagnosis not present

## 2013-08-27 DIAGNOSIS — R748 Abnormal levels of other serum enzymes: Secondary | ICD-10-CM | POA: Diagnosis not present

## 2013-08-27 DIAGNOSIS — F411 Generalized anxiety disorder: Secondary | ICD-10-CM | POA: Diagnosis not present

## 2013-08-27 DIAGNOSIS — E785 Hyperlipidemia, unspecified: Secondary | ICD-10-CM | POA: Diagnosis not present

## 2013-09-06 ENCOUNTER — Other Ambulatory Visit (HOSPITAL_COMMUNITY): Payer: Self-pay | Admitting: Anesthesiology

## 2013-09-12 DIAGNOSIS — IMO0002 Reserved for concepts with insufficient information to code with codable children: Secondary | ICD-10-CM | POA: Diagnosis not present

## 2013-09-12 DIAGNOSIS — M545 Low back pain, unspecified: Secondary | ICD-10-CM | POA: Diagnosis not present

## 2013-09-12 DIAGNOSIS — M5137 Other intervertebral disc degeneration, lumbosacral region: Secondary | ICD-10-CM | POA: Diagnosis not present

## 2013-10-07 ENCOUNTER — Other Ambulatory Visit: Payer: Self-pay | Admitting: Cardiology

## 2013-10-12 ENCOUNTER — Ambulatory Visit (INDEPENDENT_AMBULATORY_CARE_PROVIDER_SITE_OTHER): Payer: Medicare Other | Admitting: Cardiovascular Disease

## 2013-10-12 ENCOUNTER — Encounter: Payer: Self-pay | Admitting: Cardiovascular Disease

## 2013-10-12 VITALS — BP 118/82 | HR 76 | Ht 63.0 in | Wt 190.0 lb

## 2013-10-12 DIAGNOSIS — I5032 Chronic diastolic (congestive) heart failure: Secondary | ICD-10-CM

## 2013-10-12 DIAGNOSIS — Z8679 Personal history of other diseases of the circulatory system: Secondary | ICD-10-CM

## 2013-10-12 DIAGNOSIS — Z952 Presence of prosthetic heart valve: Secondary | ICD-10-CM

## 2013-10-12 DIAGNOSIS — R0602 Shortness of breath: Secondary | ICD-10-CM

## 2013-10-12 DIAGNOSIS — I509 Heart failure, unspecified: Secondary | ICD-10-CM

## 2013-10-12 DIAGNOSIS — Z9889 Other specified postprocedural states: Secondary | ICD-10-CM

## 2013-10-12 DIAGNOSIS — E785 Hyperlipidemia, unspecified: Secondary | ICD-10-CM

## 2013-10-12 DIAGNOSIS — J449 Chronic obstructive pulmonary disease, unspecified: Secondary | ICD-10-CM

## 2013-10-12 DIAGNOSIS — I4891 Unspecified atrial fibrillation: Secondary | ICD-10-CM | POA: Diagnosis not present

## 2013-10-12 DIAGNOSIS — Z954 Presence of other heart-valve replacement: Secondary | ICD-10-CM

## 2013-10-12 DIAGNOSIS — Z87891 Personal history of nicotine dependence: Secondary | ICD-10-CM

## 2013-10-12 NOTE — Patient Instructions (Signed)
Your physician has recommended that you have a pulmonary function test. Pulmonary Function Tests are a group of tests that measure how well air moves in and out of your lungs. Referral to Dr. Luan Pulling for COPD Continue all current medications. Follow up in  3 months

## 2013-10-12 NOTE — Progress Notes (Signed)
Patient ID: Nicole [name], female   DOB: 01-Sep-1935, 78 y.o.   MRN: [id number]      SUBJECTIVE: Nicole [name] is a 78 yr old woman with a history of COPD (68 pack years quit 11-28-2012), atrial fibrillation s/p AVR with pericardial valve, mitral annuloplasty, MAZE procedure and clipping of the LA appendage (30/8657) and diastolic CHF. Daughter is Nicole [name] (one of our CCU nurses at [employer]).  She is here for routine cardiovascular follow up. This is my first meeting with her. She was admitted for recurrent atrial fibrillation in the Fall of 2014, and was found to have severe AS and moderate MR. 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  She wore an event monitor between January and February which did not demonstrate any evidence of atrial fibrillation. She is no longer on anticoagulation.  She has been doing very well and denies chest pain. Her daughter says that she has not been active enough, but the patient says she has been cleaning her house and walking up and down stairs everyday and stays active. There is a big hill leading up the driveway to her daughter's house, and she recently walked up it and it took 15 minutes to recover due to shortness of breath. It appears she has COPD but has not had recent pulmonary function testing. She denies palpitations and leg swelling, as well as lightheadedness, dizziness, orthopnea, and paroxysmal nocturnal dyspnea.  She has begun doing walks in the nearby shopping mall in [place].  PMH:  1. COPD  2. Atrial fibrillation: First noted in 7/14. DCCV in 8/14 to NSR. Recurred by 9/14. DCCV to NSR 9/14 but recurred. S/p Maze procedure 10/14  3. Hyperlipidemia  4. Diastolic CHF: Echo (8/46) with EF 55-60%, grade II diastolic dysfunction, mild LVH, moderate to severe AS with mean gradient 33 mmHg/peak 49 mmHg and AVA 0.74 cm^2.  5. Severe AS/Moderate MR. - s/p AVR (tissue) with MV ring, clipping of  LAA and Maze procedure 03/05/13  6. Anomalous LM - arises from [place]. Normal coronaries cath 10/14   SH: Lives in [place], retired, nonsmoker (quit 7/14). Lives alone. Daughter Nicole [name]) comes to appointments.   FH: No premature CAD. Breast cancer.    No Known Allergies  Current Outpatient Prescriptions  Medication Sig Dispense Refill  . ALPRAZolam (XANAX) 0.25 MG tablet Take 0.25 mg by mouth at bedtime as needed for sleep.      Marland Kitchen atorvastatin (LIPITOR) 40 MG tablet Take 40 mg by mouth every evening.       . diltiazem (CARDIZEM CD) 120 MG 24 hr capsule TAKE ONE CAPSULE BY MOUTH ONE TIME DAILY   30 capsule  1  . furosemide (LASIX) 40 MG tablet Take 1 tablet (40 mg total) by mouth 2 (two) times daily.  60 tablet  4  . ipratropium-albuterol (DUONEB) 0.5-2.5 (3) MG/3ML SOLN Take 3 mLs by nebulization 2 (two) times daily.       . Multiple Vitamins-Minerals (MULTIVITAMIN WITH MINERALS) tablet Take 1 tablet by mouth daily.      . potassium chloride SA (K-DUR,KLOR-CON) 20 MEQ tablet TAKE ONE TABLET BY MOUTH ONE TIME DAILY   30 tablet  6  . sertraline (ZOLOFT) 25 MG tablet Take 25 mg by mouth daily.       No current facility-administered medications for this visit.    Past Medical History  Diagnosis Date  . SOB (shortness of breath)     conplicated by underlying a fib  with RVR,COPD, and CHF  . Atrial fibrillation     with Rapid Ventricular response  . Anxiety   . Hyperlipidemia   . COPD (chronic obstructive pulmonary disease)   . Diastolic CHF     Past Surgical History  Procedure Laterality Date  . Abdominal hysterectomy    . Cataract extraction    . Tonsillectomy    . Rotator cuff repair    . Breast biopsy      x3  . Cardioversion N/A 02/13/2013    Procedure: CARDIOVERSION;  Surgeon: [name], MD;  [facility] END[facility]COPY;  Service: Cardiovascular;  Laterality: N/A;  . Tee without cardioversion N/A 03/01/2013    Procedure: TRANSESOPHAGEAL ECHOCARDIOGRAM (TEE);  Surgeon: [name], MD;  Location: [place];  Service: Cardiovascular;  Laterality: N/A;  talked to bev. booking no. is [id number] called trish to verify time  . Cardioversion N/A 03/01/2013    Procedure: CARDIOVERSION;  Surgeon: [name], MD;  Location: [place];  Service: Cardiovascular;  Laterality: N/A;  . Aortic valve replacement N/A 03/05/2013    Procedure: AORTIC VALVE REPLACEMENT (AVR);  Surgeon: Gaye Pollack, MD;  Location: [place];  Service: Open Heart Surgery;  Laterality: N/A;  . Maze N/A 03/05/2013    Procedure: MAZE;  Surgeon: Gaye Pollack, MD;  Location: [place];  Service: Open Heart Surgery;  Laterality: N/A;  . Intraoperative transesophageal echocardiogram N/A 03/05/2013    Procedure: INTRAOPERATIVE TRANSESOPHAGEAL ECHOCARDIOGRAM;  Surgeon: Gaye Pollack, MD;  [facility];  Service: Open Heart Surgery;  Laterality: N/A;  . Mitral valve repair N/A 03/05/2013    Procedure: MITRAL VALVE REPAIR (MVR);  Surgeon: Gaye Pollack, MD;  Location: [place];  Service: Open Heart Surgery;  Laterality: N/A;    History   Social History  . Marital Status: Single    Spouse Name: N/A    Number of Children: N/A  . Years of Education: N/A   Occupational History  . Not on file.   Social History Main Topics  . Smoking status: Former Smoker -- 1.00 packs/day for 70 years    Types: Cigarettes    Start date: 06/12/1955    Quit date: 11/28/2012  . Smokeless tobacco: Never Used     Comment: Quit in July 2014  . Alcohol Use: No  . Drug Use: No  . Sexual Activity: Not on file   Other Topics Concern  . Not on file   Social History Narrative  . No narrative on file     Filed Vitals:   10/12/13 1050  BP: 118/82  Pulse: 76  Height: 5\' 3"  (1.6 m)  Weight: 190 lb (86.183 kg)    PHYSICAL EXAM General: NAD  Neck: JVP 7 cm H2O, no thyromegaly or thyroid nodule.  Lungs: Diminished b/l, no rales or wheezes. CV: Well healed midline sternotomy. Regular rate and rhythm, normal S1, S2  crisp. 2/6 SEM at RSB. 2/6 SEM at LSB No MR. No carotid bruit. Normal pedal pulses.  Abdomen: obese. soft, nontender, no hepatosplenomegaly, no distention.  Skin: Intact without lesions or rashes.  Neurologic: Alert and oriented x 3.  Psych: Normal affect.  Extremities: No edema. HEENT: Normal.    ECG: reviewed and available in electronic records.      ASSESSMENT AND PLAN: 1. Valvular heart disease: Severe aortic stenosis & moderate MR - s/p AVR (tissue) and MV ring with Maze procedure 03/15/13. She is symptomatically stable. No change in therapy indicated. 2. Atrial fibrillation -  s/p Maze. Remains in NSR. Event monitoring did not demonstrate any evidence of atrial fibrillation, thus she is no longer on anticoagulation. Currently taking long-acting diltiazem 120 mg daily. 3. Chronic diastolic CHF: Currently with NYHA class I-II symptoms. She is currently compensated and euvolemic. She checks her weight daily and adheres to a low-sodium diet. Currently taking Lasix 40 mg twice daily with potassium supplementation. She has been innstructed to call the HF clinic if weight increases more than 3 lbs overnight or 5 lbs in a week.  4. COPD: I will obtain pulmonary function tests and refer her to Dr. Luan Pulling, pulmonologist in [place]. 5. Hyperlipidemia: Currently taking Lipitor 40 mg daily.  Dispo: f/u 3 months.   Kate Sable, M.D., F.A.C.C.

## 2013-10-23 ENCOUNTER — Ambulatory Visit (HOSPITAL_COMMUNITY)
Admission: RE | Admit: 2013-10-23 | Discharge: 2013-10-23 | Disposition: A | Discharge status: Home / Self Care | Payer: Medicare Other | Source: Ambulatory Visit | Attending: Cardiovascular Disease | Admitting: Cardiovascular Disease

## 2013-10-23 DIAGNOSIS — J4489 Other specified chronic obstructive pulmonary disease: Secondary | ICD-10-CM | POA: Insufficient documentation

## 2013-10-23 DIAGNOSIS — J449 Chronic obstructive pulmonary disease, unspecified: Secondary | ICD-10-CM | POA: Diagnosis not present

## 2013-10-23 MED ORDER — ALBUTEROL SULFATE (2.5 MG/3ML) 0.083% IN NEBU
2.5000 mg | INHALATION_SOLUTION | Freq: Once | RESPIRATORY_TRACT | Status: AC
  Administered 2013-10-23: 2.5 mg via RESPIRATORY_TRACT

## 2013-10-30 LAB — PULMONARY FUNCTION TEST
DL/VA % pred: 83 %
DL/VA: 3.93 ml/min/mmHg/L
DLCO cor % pred: 50 %
DLCO cor: 11.59 ml/min/mmHg
DLCO unc % pred: 50 %
DLCO unc: 11.59 ml/min/mmHg
FEF 25-75 POST: 1.23 L/s
FEF 25-75 Pre: 0.85 L/sec
FEF2575-%CHANGE-POST: 43 %
FEF2575-%PRED-PRE: 55 %
FEF2575-%Pred-Post: 80 %
FEV1-%Change-Post: 11 %
FEV1-%PRED-PRE: 61 %
FEV1-%Pred-Post: 68 %
FEV1-POST: 1.34 L
FEV1-PRE: 1.2 L
FEV1FVC-%CHANGE-POST: 1 %
FEV1FVC-%PRED-PRE: 96 %
FEV6-%Change-Post: 10 %
FEV6-%PRED-POST: 73 %
FEV6-%Pred-Pre: 66 %
FEV6-PRE: 1.67 L
FEV6-Post: 1.84 L
FEV6FVC-%Pred-Post: 105 %
FEV6FVC-%Pred-Pre: 105 %
FVC-%Change-Post: 10 %
FVC-%Pred-Post: 69 %
FVC-%Pred-Pre: 63 %
FVC-POST: 1.84 L
FVC-Pre: 1.67 L
POST FEV1/FVC RATIO: 73 %
POST FEV6/FVC RATIO: 100 %
Pre FEV1/FVC ratio: 72 %
Pre FEV6/FVC Ratio: 100 %
RV % PRED: 85 %
RV: 1.93 L
TLC % pred: 73 %
TLC: 3.6 L

## 2013-11-05 ENCOUNTER — Other Ambulatory Visit (HOSPITAL_COMMUNITY): Payer: Self-pay | Admitting: Internal Medicine

## 2013-11-09 ENCOUNTER — Telehealth: Payer: Self-pay | Admitting: *Deleted

## 2013-11-09 NOTE — Telephone Encounter (Signed)
Notes Recorded by Laurine Blazer, LPN on 06/18/1476 at 29:56 AM Patient notified. She is scheduled to see Dr. Luan Pulling on 11/20/2013 at 3:00. Results will be forwarded to Dr. Luan Pulling.

## 2013-11-09 NOTE — Telephone Encounter (Signed)
Message copied by Laurine Blazer on Fri Nov 09, 2013 11:40 AM ------      Message from: Kate Sable A      Created: Tue Oct 23, 2013  4:31 PM       Should f/u with Dr. Luan Pulling. ------

## 2013-11-12 DIAGNOSIS — H04129 Dry eye syndrome of unspecified lacrimal gland: Secondary | ICD-10-CM | POA: Diagnosis not present

## 2013-11-12 DIAGNOSIS — H278 Other specified disorders of lens: Secondary | ICD-10-CM | POA: Diagnosis not present

## 2013-11-12 DIAGNOSIS — H526 Other disorders of refraction: Secondary | ICD-10-CM | POA: Diagnosis not present

## 2013-11-12 DIAGNOSIS — H2640 Unspecified secondary cataract: Secondary | ICD-10-CM | POA: Diagnosis not present

## 2013-11-20 DIAGNOSIS — I4891 Unspecified atrial fibrillation: Secondary | ICD-10-CM | POA: Diagnosis not present

## 2013-11-20 DIAGNOSIS — I10 Essential (primary) hypertension: Secondary | ICD-10-CM | POA: Diagnosis not present

## 2013-11-20 DIAGNOSIS — J449 Chronic obstructive pulmonary disease, unspecified: Secondary | ICD-10-CM | POA: Diagnosis not present

## 2013-11-20 DIAGNOSIS — I359 Nonrheumatic aortic valve disorder, unspecified: Secondary | ICD-10-CM | POA: Diagnosis not present

## 2013-11-26 DIAGNOSIS — M545 Low back pain, unspecified: Secondary | ICD-10-CM | POA: Diagnosis not present

## 2013-11-26 DIAGNOSIS — M5137 Other intervertebral disc degeneration, lumbosacral region: Secondary | ICD-10-CM | POA: Diagnosis not present

## 2014-01-01 DIAGNOSIS — F411 Generalized anxiety disorder: Secondary | ICD-10-CM | POA: Diagnosis not present

## 2014-01-01 DIAGNOSIS — I509 Heart failure, unspecified: Secondary | ICD-10-CM | POA: Diagnosis not present

## 2014-01-01 DIAGNOSIS — I259 Chronic ischemic heart disease, unspecified: Secondary | ICD-10-CM | POA: Diagnosis not present

## 2014-01-01 DIAGNOSIS — J449 Chronic obstructive pulmonary disease, unspecified: Secondary | ICD-10-CM | POA: Diagnosis not present

## 2014-01-15 ENCOUNTER — Ambulatory Visit (INDEPENDENT_AMBULATORY_CARE_PROVIDER_SITE_OTHER): Payer: Medicare Other | Admitting: Cardiovascular Disease

## 2014-01-15 ENCOUNTER — Encounter: Payer: Self-pay | Admitting: Cardiovascular Disease

## 2014-01-15 VITALS — BP 108/71 | HR 71 | Ht 63.0 in | Wt 198.0 lb

## 2014-01-15 DIAGNOSIS — Z8679 Personal history of other diseases of the circulatory system: Secondary | ICD-10-CM

## 2014-01-15 DIAGNOSIS — Z87891 Personal history of nicotine dependence: Secondary | ICD-10-CM

## 2014-01-15 DIAGNOSIS — I5032 Chronic diastolic (congestive) heart failure: Secondary | ICD-10-CM

## 2014-01-15 DIAGNOSIS — E785 Hyperlipidemia, unspecified: Secondary | ICD-10-CM

## 2014-01-15 DIAGNOSIS — I4891 Unspecified atrial fibrillation: Secondary | ICD-10-CM

## 2014-01-15 DIAGNOSIS — I480 Paroxysmal atrial fibrillation: Secondary | ICD-10-CM

## 2014-01-15 DIAGNOSIS — J438 Other emphysema: Secondary | ICD-10-CM | POA: Diagnosis not present

## 2014-01-15 DIAGNOSIS — Z954 Presence of other heart-valve replacement: Secondary | ICD-10-CM | POA: Diagnosis not present

## 2014-01-15 DIAGNOSIS — I509 Heart failure, unspecified: Secondary | ICD-10-CM

## 2014-01-15 DIAGNOSIS — Z9889 Other specified postprocedural states: Secondary | ICD-10-CM

## 2014-01-15 DIAGNOSIS — Z952 Presence of prosthetic heart valve: Secondary | ICD-10-CM

## 2014-01-15 MED ORDER — FUROSEMIDE 40 MG PO TABS: 40.0000 mg | ORAL_TABLET | Freq: Two times a day (BID) | ORAL | Status: DC

## 2014-01-15 NOTE — Progress Notes (Signed)
Patient ID: Nicole [name], female   DOB: 14-Dec-1935, 78 y.o.   MRN: [id number]      SUBJECTIVE: [name] is a 78 yr old woman with a history of COPD (35 pack years quit 11-28-2012), atrial fibrillation s/p AVR with pericardial valve, mitral annuloplasty, MAZE procedure and clipping of the LA appendage (97/9892) and diastolic CHF. Daughter is Nicole [name] (one of our CCU nurses at [employer]).  She is here for routine cardiovascular follow up.  In summary, she was admitted for recurrent atrial fibrillation in the fall of 2014 and was found to have severe AS and moderate MR. 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  She wore an event monitor between January and February which did not demonstrate any evidence of atrial fibrillation. She is no longer on anticoagulation.  She has been feeling well. She now takes Lasix 40 mg twice daily. She denies chest pain and palpitations. She has shortness of breath when climbing up steep hills such as the one to her daughter's house. She is bothered by lumbar spinal issues. She denies leg swelling and orthopnea as well as PND.    Review of Systems: As per "subjective", otherwise negative.  No Known Allergies  Current Outpatient Prescriptions  Medication Sig Dispense Refill  . atorvastatin (LIPITOR) 40 MG tablet Take 40 mg by mouth every evening.       Marland Kitchen CARTIA XT 120 MG 24 hr capsule TAKE ONE CAPSULE BY MOUTH ONE TIME DAILY   30 capsule  6  . furosemide (LASIX) 40 MG tablet Take 1 tablet (40 mg total) by mouth 2 (two) times daily.  60 tablet  4  . Multiple Vitamins-Minerals (MULTIVITAMIN WITH MINERALS) tablet Take 1 tablet by mouth daily.      . potassium chloride SA (K-DUR,KLOR-CON) 20 MEQ tablet TAKE ONE TABLET BY MOUTH ONE TIME DAILY   30 tablet  6  . sertraline (ZOLOFT) 25 MG tablet Take 25 mg by mouth daily.      Marland Kitchen tiotropium (SPIRIVA) 18 MCG inhalation capsule Place 18 mcg into inhaler  and inhale daily.       No current facility-administered medications for this visit.    Past Medical History  Diagnosis Date  . SOB (shortness of breath)     conplicated by underlying a fib with RVR,COPD, and CHF  . Atrial fibrillation     with Rapid Ventricular response  . Anxiety   . Hyperlipidemia   . COPD (chronic obstructive pulmonary disease)   . Diastolic CHF     Past Surgical History  Procedure Laterality Date  . Abdominal hysterectomy    . Cataract extraction    . Tonsillectomy    . Rotator cuff repair    . Breast biopsy      x3  . Cardioversion N/A 02/13/2013    Procedure: CARDIOVERSION;  Surgeon: [name], MD;  Location: [facility] ENDOSCOPY;  Service: Cardiovascular;  Laterality: N/A;  . Tee without cardioversion N/A 03/01/2013    Procedure: TRANSESOPHAGEAL ECHOCARDIOGRAM (TEE);  Surgeon: [name], MD;  Location: [place];  Service: Cardiovascular;  Laterality: N/A;  talked to bev. booking no. is [phone] called trish to verify time  . Cardioversion N/A 03/01/2013    Procedure: CARDIOVERSION;  Surgeon: [name], MD;  Location: [place];  Service: Cardiovascular;  Laterality: N/A;  . Aortic valve replacement N/A 03/05/2013    Procedure: AORTIC VALVE REPLACEMENT (AVR);  Surgeon: Gaye Pollack, MD;  Location:  [place] OR;  Service: Open Heart Surgery;  Laterality: N/A;  . Maze N/A 03/05/2013    Procedure: MAZE;  Surgeon: Gaye Pollack, MD;  Location: [place];  Service: Open Heart Surgery;  Laterality: N/A;  . Intraoperative transesophageal echocardiogram N/A 03/05/2013    Procedure: INTRAOPERATIVE TRANSESOPHAGEAL ECHOCARDIOGRAM;  Surgeon: Gaye Pollack, MD;  Location: [facility] OR;  Service: Open Heart Surgery;  Laterality: N/A;  . Mitral valve repair N/A 03/05/2013    Procedure: MITRAL VALVE REPAIR (MVR);  Surgeon: Gaye Pollack, MD;  Location: [place];  Service: Open Heart Surgery;  Laterality: N/A;    History   Social History  . Marital Status: Single     Spouse Name: N/A    Number of Children: N/A  . Years of Education: N/A   Occupational History  . Not on file.   Social History Main Topics  . Smoking status: Former Smoker -- 1.00 packs/day for 4 years    Types: Cigarettes    Start date: 06/12/1955    Quit date: 11/28/2012  . Smokeless tobacco: Never Used     Comment: Quit in July 2014  . Alcohol Use: No  . Drug Use: No  . Sexual Activity: Not on file   Other Topics Concern  . Not on file   Social History Narrative  . No narrative on file     Filed Vitals:   01/15/14 0954  BP: 108/71  Pulse: 71  Height: 5\' 3"  (1.6 m)  Weight: 198 lb (89.812 kg)  SpO2: 97%    PHYSICAL EXAM General: NAD  Neck: JVP 7 cm H2O, no thyromegaly or thyroid nodule.  Lungs: Diminished b/l, no rales or wheezes.  CV: Well healed midline sternotomy. Regular rate and rhythm, normal S1, S2 crisp. 2/6 SEM at RSB. 2/6 SEM at LSB No MR. No carotid bruit. Normal pedal pulses.  Abdomen: obese. soft, nontender, no hepatosplenomegaly, no distention.  Skin: Intact without lesions or rashes.  Neurologic: Alert and oriented x 3.  Psych: Normal affect.  Extremities: No edema.  HEENT: Normal.   ECG: reviewed and available in electronic records.      ASSESSMENT AND PLAN: 1. Valvular heart disease: Severe aortic stenosis & moderate MR - s/p AVR (tissue) and MV ring with Maze procedure 03/15/13. She is symptomatically stable. No change in therapy indicated.  2. Atrial fibrillation - s/p Maze. Remains in NSR. Event monitoring did not demonstrate any evidence of atrial fibrillation, thus she is no longer on anticoagulation. Currently taking long-acting diltiazem 120 mg daily.  3. Chronic diastolic CHF: Currently with NYHA class I-II symptoms. She is currently compensated and euvolemic. She checks her weight daily and adheres to a low-sodium diet. Currently taking Lasix 40 mg twice daily with potassium supplementation.  She has been innstructed to call the HF  clinic if weight increases more than 3 lbs overnight or 5 lbs in a week. Will check a BMET. 4. COPD: PFT's showed moderately reduced DLCO. Referred to Dr. Luan Pulling, pulmonologist in [place].  5. Hyperlipidemia: Currently taking Lipitor 40 mg daily.   Dispo: f/u 4 months.  Kate Sable, M.D., F.A.C.C.

## 2014-01-15 NOTE — Patient Instructions (Signed)
   Lab for BMET Office will contact with results via phone or letter.   Continue all current medications. Your physician wants you to follow up in:  4 months.  You will receive a reminder letter in the mail one-two months in advance.  If you don't receive a letter, please call our office to schedule the follow up appointment   

## 2014-02-07 DIAGNOSIS — I5032 Chronic diastolic (congestive) heart failure: Secondary | ICD-10-CM | POA: Diagnosis not present

## 2014-02-11 ENCOUNTER — Telehealth: Payer: Self-pay | Admitting: *Deleted

## 2014-02-11 NOTE — Telephone Encounter (Signed)
Pt notified of results

## 2014-02-11 NOTE — Telephone Encounter (Signed)
Message copied by Orion Modest on Mon Feb 11, 2014  3:22 PM ------      Message from: Kate Sable A      Created: Mon Feb 11, 2014  1:42 PM       Stable. ------

## 2014-02-27 DIAGNOSIS — E785 Hyperlipidemia, unspecified: Secondary | ICD-10-CM | POA: Diagnosis not present

## 2014-02-28 DIAGNOSIS — I10 Essential (primary) hypertension: Secondary | ICD-10-CM | POA: Diagnosis not present

## 2014-02-28 DIAGNOSIS — E785 Hyperlipidemia, unspecified: Secondary | ICD-10-CM | POA: Diagnosis not present

## 2014-02-28 DIAGNOSIS — F419 Anxiety disorder, unspecified: Secondary | ICD-10-CM | POA: Diagnosis not present

## 2014-02-28 DIAGNOSIS — J449 Chronic obstructive pulmonary disease, unspecified: Secondary | ICD-10-CM | POA: Diagnosis not present

## 2014-02-28 DIAGNOSIS — Z23 Encounter for immunization: Secondary | ICD-10-CM | POA: Diagnosis not present

## 2014-04-29 DIAGNOSIS — K432 Incisional hernia without obstruction or gangrene: Secondary | ICD-10-CM | POA: Diagnosis not present

## 2014-04-29 DIAGNOSIS — J449 Chronic obstructive pulmonary disease, unspecified: Secondary | ICD-10-CM | POA: Diagnosis not present

## 2014-04-29 DIAGNOSIS — I2510 Atherosclerotic heart disease of native coronary artery without angina pectoris: Secondary | ICD-10-CM | POA: Diagnosis not present

## 2014-04-29 DIAGNOSIS — R600 Localized edema: Secondary | ICD-10-CM | POA: Diagnosis not present

## 2014-04-30 ENCOUNTER — Ambulatory Visit (INDEPENDENT_AMBULATORY_CARE_PROVIDER_SITE_OTHER): Payer: Medicare Other | Admitting: Cardiovascular Disease

## 2014-04-30 ENCOUNTER — Encounter: Payer: Self-pay | Admitting: Cardiovascular Disease

## 2014-04-30 VITALS — BP 112/77 | HR 68 | Ht 63.0 in | Wt 202.0 lb

## 2014-04-30 DIAGNOSIS — J438 Other emphysema: Secondary | ICD-10-CM | POA: Diagnosis not present

## 2014-04-30 DIAGNOSIS — Z8679 Personal history of other diseases of the circulatory system: Secondary | ICD-10-CM

## 2014-04-30 DIAGNOSIS — I5032 Chronic diastolic (congestive) heart failure: Secondary | ICD-10-CM | POA: Diagnosis not present

## 2014-04-30 DIAGNOSIS — I8393 Asymptomatic varicose veins of bilateral lower extremities: Secondary | ICD-10-CM

## 2014-04-30 DIAGNOSIS — I480 Paroxysmal atrial fibrillation: Secondary | ICD-10-CM | POA: Diagnosis not present

## 2014-04-30 DIAGNOSIS — Z87891 Personal history of nicotine dependence: Secondary | ICD-10-CM | POA: Diagnosis not present

## 2014-04-30 DIAGNOSIS — Z9889 Other specified postprocedural states: Secondary | ICD-10-CM

## 2014-04-30 DIAGNOSIS — K432 Incisional hernia without obstruction or gangrene: Secondary | ICD-10-CM | POA: Diagnosis not present

## 2014-04-30 DIAGNOSIS — I8390 Asymptomatic varicose veins of unspecified lower extremity: Secondary | ICD-10-CM

## 2014-04-30 DIAGNOSIS — E785 Hyperlipidemia, unspecified: Secondary | ICD-10-CM | POA: Diagnosis not present

## 2014-04-30 DIAGNOSIS — Z954 Presence of other heart-valve replacement: Secondary | ICD-10-CM | POA: Diagnosis not present

## 2014-04-30 DIAGNOSIS — Z952 Presence of prosthetic heart valve: Secondary | ICD-10-CM

## 2014-04-30 NOTE — Patient Instructions (Signed)

## 2014-04-30 NOTE — Progress Notes (Signed)
Patient ID: Nicole [name], female   DOB: 1936/04/21, 78 y.o.   MRN: [id number]      SUBJECTIVE: Ms. Nicole [name] is a 78 yr old woman with a history of COPD (21 pack years quit 11-28-2012), atrial fibrillation s/p AVR with pericardial valve, mitral annuloplasty, MAZE procedure and clipping of the LA appendage (52/7782) and diastolic CHF. Daughter is Nicole [name] (one of our CCU nurses at [employer]).  She is here for routine cardiovascular follow up.  In summary, she was admitted for recurrent atrial fibrillation in the Fall of 2014 and was found to have severe AS and moderate MR. 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  She wore an event monitor between January and February which did not demonstrate any evidence of atrial fibrillation. She is no longer on anticoagulation.  BUN 20, SCr 0.9 on 02/07/14.  She has had some mild left ankle swelling and has varicose veins. She has not been wearing compression stockings. She has also developed an incisional hernia over the past few months, but denies significant pain/tenderness. She used to be an OR Marine scientist for 31 years.   Review of Systems: As per "subjective", otherwise negative.  No Known Allergies  Current Outpatient Prescriptions  Medication Sig Dispense Refill  . atorvastatin (LIPITOR) 40 MG tablet Take 40 mg by mouth every evening.     Marland Kitchen CARTIA XT 120 MG 24 hr capsule TAKE ONE CAPSULE BY MOUTH ONE TIME DAILY  30 capsule 6  . furosemide (LASIX) 40 MG tablet Take 1 tablet (40 mg total) by mouth 2 (two) times daily. 60 tablet 6  . Multiple Vitamins-Minerals (MULTIVITAMIN WITH MINERALS) tablet Take 1 tablet by mouth daily.    . potassium chloride SA (K-DUR,KLOR-CON) 20 MEQ tablet TAKE ONE TABLET BY MOUTH ONE TIME DAILY  30 tablet 6  . sertraline (ZOLOFT) 25 MG tablet Take 25 mg by mouth daily.    Marland Kitchen tiotropium (SPIRIVA) 18 MCG inhalation capsule Place 18 mcg into inhaler and inhale  daily.     No current facility-administered medications for this visit.    Past Medical History  Diagnosis Date  . SOB (shortness of breath)     conplicated by underlying a fib with RVR,COPD, and CHF  . Atrial fibrillation     with Rapid Ventricular response  . Anxiety   . Hyperlipidemia   . COPD (chronic obstructive pulmonary disease)   . Diastolic CHF     Past Surgical History  Procedure Laterality Date  . Abdominal hysterectomy    . Cataract extraction    . Tonsillectomy    . Rotator cuff repair    . Breast biopsy      x3  . Cardioversion N/A 02/13/2013    Procedure: CARDIOVERSION;  Surgeon: [name], MD;  Location: [facility] ENDOSCOPY;  Service: Cardiovascular;  Laterality: N/A;  . Tee without cardioversion N/A 03/01/2013    Procedure: TRANSESOPHAGEAL ECHOCARDIOGRAM (TEE);  Surgeon: [name], MD;  Location: [place];  Service: Cardiovascular;  Laterality: N/A;  talked to bev. booking no. is [phone] called trish to verify time  . Cardioversion N/A 03/01/2013    Procedure: CARDIOVERSION;  Surgeon: [name], MD;  Location: [place];  Service: Cardiovascular;  Laterality: N/A;  . Aortic valve replacement N/A 03/05/2013    Procedure: AORTIC VALVE REPLACEMENT (AVR);  Surgeon: Gaye Pollack, MD;  Location: [place];  Service: Open Heart Surgery;  Laterality: N/A;  . Maze N/A 03/05/2013  Procedure: MAZE;  Surgeon: Gaye Pollack, MD;  Location: [place];  Service: Open Heart Surgery;  Laterality: N/A;  . Intraoperative transesophageal echocardiogram N/A 03/05/2013    Procedure: INTRAOPERATIVE TRANSESOPHAGEAL ECHOCARDIOGRAM;  Surgeon: Gaye Pollack, MD;  Location: [facility] OR;  Service: Open Heart Surgery;  Laterality: N/A;  . Mitral valve repair N/A 03/05/2013    Procedure: MITRAL VALVE REPAIR (MVR);  Surgeon: Gaye Pollack, MD;  Location: [place];  Service: Open Heart Surgery;  Laterality: N/A;    History   Social History  . Marital Status: Single    Spouse Name: N/A      Number of Children: N/A  . Years of Education: N/A   Occupational History  . Not on file.   Social History Main Topics  . Smoking status: Former Smoker -- 1.00 packs/day for 39 years    Types: Cigarettes    Start date: 06/12/1955    Quit date: 11/28/2012  . Smokeless tobacco: Never Used     Comment: Quit in July 2014  . Alcohol Use: No  . Drug Use: No  . Sexual Activity: No   Other Topics Concern  . Not on file   Social History Narrative     Filed Vitals:   04/30/14 1014  BP: 112/77  Pulse: 68  Height: 5\' 3"  (1.6 m)  Weight: 202 lb (91.627 kg)  SpO2: 97%    PHYSICAL EXAM General: NAD  Neck: JVP 7 cm H2O, no thyromegaly or thyroid nodule.  Lungs: Diminished b/l, no rales or wheezes.  CV: Well healed midline sternotomy. Regular rate and rhythm, normal S1, S2 crisp. 2/6 SEM at RSB. 2/6 SEM at LSB No MR. No carotid bruit. Normal pedal pulses.  Abdomen: Midline incisional hernia. Soft, nontender, no hepatosplenomegaly, mild epigastric distention.  Skin: Intact without lesions or rashes.  Neurologic: Alert and oriented x 3.  Psych: Normal affect.  Extremities: No edema. Bilateral venous varicosities L>R noted. HEENT: Normal.   ECG: Most recent ECG reviewed.    ASSESSMENT AND PLAN: 1. Valvular heart disease: Severe aortic stenosis & moderate MR - s/p AVR (tissue) and MV ring with Maze procedure 03/15/13. She is symptomatically stable. No change in therapy indicated.  2. Atrial fibrillation - s/p Maze. Remains in NSR. Event monitoring did not demonstrate any evidence of atrial fibrillation, thus she is no longer on anticoagulation. Currently taking long-acting diltiazem 120 mg daily.  3. Chronic diastolic CHF: Currently with NYHA class I-II symptoms. She is currently compensated and euvolemic. She checks her weight daily and adheres to a low-sodium diet. Currently taking Lasix 40 mg twice daily with potassium supplementation.  She has been innstructed to  call the HF clinic if weight increases more than 3 lbs overnight or 5 lbs in a week. BUN 20, SCr 0.9 on 02/07/14. 4. COPD: PFT's showed moderately reduced DLCO. Previously referred to Dr. Luan Pulling, pulmonologist in [place].  5. Hyperlipidemia: Currently taking Lipitor 40 mg daily.  6. Bilateral leg swelling/venous varicosities: Instructed to wear knee-high compression stockings. 7. Midline incisional hernia: She is going to f/u with surgery. No significant tenderness. Recommended she try an abdominal binder for comfort until she follows up with surgery, who may obtain imaging.  Dispo: f/u 6 months.    Kate Sable, M.D., F.A.C.C.

## 2014-05-07 ENCOUNTER — Other Ambulatory Visit: Payer: Self-pay | Admitting: Surgery

## 2014-05-07 DIAGNOSIS — I350 Nonrheumatic aortic (valve) stenosis: Secondary | ICD-10-CM

## 2014-05-08 ENCOUNTER — Encounter: Payer: Self-pay | Admitting: Surgery

## 2014-05-08 ENCOUNTER — Ambulatory Visit
Admission: RE | Admit: 2014-05-08 | Discharge: 2014-05-08 | Disposition: A | Discharge status: Home / Self Care | Payer: Medicare Other | Source: Ambulatory Visit | Attending: Surgery | Admitting: Surgery

## 2014-05-08 ENCOUNTER — Ambulatory Visit (INDEPENDENT_AMBULATORY_CARE_PROVIDER_SITE_OTHER): Payer: Medicare Other | Admitting: Surgery

## 2014-05-08 VITALS — BP 136/77 | HR 79 | Resp 20 | Ht 63.0 in | Wt 202.0 lb

## 2014-05-08 DIAGNOSIS — J90 Pleural effusion, not elsewhere classified: Secondary | ICD-10-CM | POA: Diagnosis not present

## 2014-05-08 DIAGNOSIS — Z8679 Personal history of other diseases of the circulatory system: Secondary | ICD-10-CM

## 2014-05-08 DIAGNOSIS — K432 Incisional hernia without obstruction or gangrene: Secondary | ICD-10-CM

## 2014-05-08 DIAGNOSIS — I340 Nonrheumatic mitral (valve) insufficiency: Secondary | ICD-10-CM

## 2014-05-08 DIAGNOSIS — Z954 Presence of other heart-valve replacement: Secondary | ICD-10-CM | POA: Diagnosis not present

## 2014-05-08 DIAGNOSIS — Z9889 Other specified postprocedural states: Secondary | ICD-10-CM | POA: Diagnosis not present

## 2014-05-08 DIAGNOSIS — I480 Paroxysmal atrial fibrillation: Secondary | ICD-10-CM

## 2014-05-08 DIAGNOSIS — I350 Nonrheumatic aortic (valve) stenosis: Secondary | ICD-10-CM

## 2014-05-08 DIAGNOSIS — Z952 Presence of prosthetic heart valve: Secondary | ICD-10-CM

## 2014-05-09 ENCOUNTER — Encounter (HOSPITAL_COMMUNITY): Payer: Self-pay | Admitting: Cardiology

## 2014-05-10 ENCOUNTER — Other Ambulatory Visit (HOSPITAL_COMMUNITY): Payer: Self-pay | Admitting: Cardiology

## 2014-05-10 DIAGNOSIS — I5022 Chronic systolic (congestive) heart failure: Secondary | ICD-10-CM

## 2014-05-10 MED ORDER — POTASSIUM CHLORIDE CRYS ER 20 MEQ PO TBCR: 20.0000 meq | EXTENDED_RELEASE_TABLET | Freq: Every day | ORAL | Status: DC

## 2014-05-12 ENCOUNTER — Encounter: Payer: Self-pay | Admitting: Surgery

## 2014-05-12 NOTE — Progress Notes (Signed)
HPI:  The patient returns today for evaluation of a possible hernia at the lower end of her sternotomy incision just below the xyphoid process. She is S/P aortic valve replacement with a pericardial valve, mitral annuloplasty, MAZE procedure and clipping of the left atrial appendage on 03/05/2013. She noticed a bulge in the upper abdomen a few months ago and it has increased a little in size since then. It has not caused her any pain.  Current Outpatient Prescriptions  Medication Sig Dispense Refill  . atorvastatin (LIPITOR) 40 MG tablet Take 40 mg by mouth every evening.     Marland Kitchen CARTIA XT 120 MG 24 hr capsule TAKE ONE CAPSULE BY MOUTH ONE TIME DAILY  30 capsule 6  . furosemide (LASIX) 40 MG tablet Take 1 tablet (40 mg total) by mouth 2 (two) times daily. 60 tablet 6  . Multiple Vitamins-Minerals (MULTIVITAMIN WITH MINERALS) tablet Take 1 tablet by mouth daily.    . sertraline (ZOLOFT) 25 MG tablet Take 25 mg by mouth daily.    Marland Kitchen tiotropium (SPIRIVA) 18 MCG inhalation capsule Place 18 mcg into inhaler and inhale daily.    . potassium chloride SA (K-DUR,KLOR-CON) 20 MEQ tablet Take 1 tablet (20 mEq total) by mouth daily. 30 tablet 6   No current facility-administered medications for this visit.     Physical Exam:  BP 136/77 mmHg  Pulse 79  Resp 20  Ht 5\' 3"  (1.6 m)  Wt 202 lb (91.627 kg)  BMI 35.79 kg/m2  SpO2 96% She looks well Lungs are clear Cardiac exam shows a regular rate and rhythm with normal heart sounds The sternotomy scar is well-healed. The sternum is stable.  There is a large palpable defect in the midline fascia in the epigastrium below the lower end of the incision. I would estimate that it is 6-7 cm wide. The hernia sac is easily reducible. It is nontender. Abdomen: normal bowel sounds, obese and nontender  Diagnostic Tests:  CLINICAL DATA: Aortic stenosis. Shortness of breath. Stinging at incision site.  EXAM: CHEST 2 VIEW  COMPARISON:  04/11/2013  FINDINGS: Sequelae of prior median sternotomy are again identified with evidence of prior left atrial appendage clipping, aortic valve replacement, and mitral valve repair. Cardiac silhouette is upper limits of normal in size. Right pleural effusion has decreased in size, with trace bilateral pleural effusions questioned on the current examination. There is improved aeration of the right lung base. There is no evidence of acute air space consolidation, edema, or pneumothorax. Right upper quadrant abdominal surgical clips are noted. Aortic calcification is noted. Mild, chronic compression fracture at the thoracolumbar junction is unchanged.  IMPRESSION: 1. No evidence of acute airspace disease. 2. Decreased size of right pleural effusion. Question trace bilateral pleural effusions.   Electronically Signed  By: Logan Bores  On: 05/08/2014 11:46   Impression:  She has a large epigastric incisional hernia a little over 1 year postop. This is at the lower end of the sternotomy incision in the epigastric fascia and is certainly related to obesity and increased stress on the incision. It is not causing any symptoms at this time. I discussed the options of surgical repair requiring prosthetic mesh and continued observation. It may continue to enlarge over time given her obesity. I discussed using an abdominal binder although this is a difficult area since it is high on the abdominal wall and a binder is hard to wear in this location. There is no absolute reason that it  has to be repaired unless it is causing symptoms of pain. It is large enough that I don't think it would become incarcerated.   Plan:  She would like to continue observation for now and her daughter is a Marine scientist and will check it periodically for any changes. She will contact me if there is progressive enlargement or she develops symptoms.

## 2014-06-03 ENCOUNTER — Other Ambulatory Visit (HOSPITAL_COMMUNITY): Payer: Self-pay | Admitting: Internal Medicine

## 2014-08-01 DIAGNOSIS — I4891 Unspecified atrial fibrillation: Secondary | ICD-10-CM | POA: Diagnosis not present

## 2014-08-01 DIAGNOSIS — E669 Obesity, unspecified: Secondary | ICD-10-CM | POA: Diagnosis not present

## 2014-08-01 DIAGNOSIS — E785 Hyperlipidemia, unspecified: Secondary | ICD-10-CM | POA: Diagnosis not present

## 2014-08-01 DIAGNOSIS — I10 Essential (primary) hypertension: Secondary | ICD-10-CM | POA: Diagnosis not present

## 2014-08-01 DIAGNOSIS — E784 Other hyperlipidemia: Secondary | ICD-10-CM | POA: Diagnosis not present

## 2014-08-19 ENCOUNTER — Other Ambulatory Visit: Payer: Self-pay | Admitting: Cardiovascular Disease

## 2014-08-28 DIAGNOSIS — J449 Chronic obstructive pulmonary disease, unspecified: Secondary | ICD-10-CM | POA: Diagnosis not present

## 2014-08-28 DIAGNOSIS — I2510 Atherosclerotic heart disease of native coronary artery without angina pectoris: Secondary | ICD-10-CM | POA: Diagnosis not present

## 2014-08-28 DIAGNOSIS — I10 Essential (primary) hypertension: Secondary | ICD-10-CM | POA: Diagnosis not present

## 2014-08-28 DIAGNOSIS — F419 Anxiety disorder, unspecified: Secondary | ICD-10-CM | POA: Diagnosis not present

## 2014-10-14 ENCOUNTER — Ambulatory Visit (INDEPENDENT_AMBULATORY_CARE_PROVIDER_SITE_OTHER): Payer: Medicare Other | Admitting: Cardiovascular Disease

## 2014-10-14 ENCOUNTER — Encounter: Payer: Self-pay | Admitting: Cardiovascular Disease

## 2014-10-14 ENCOUNTER — Encounter (INDEPENDENT_AMBULATORY_CARE_PROVIDER_SITE_OTHER): Payer: Medicare Other

## 2014-10-14 ENCOUNTER — Other Ambulatory Visit: Payer: Self-pay | Admitting: *Deleted

## 2014-10-14 VITALS — BP 109/71 | HR 69 | Ht 63.0 in | Wt 209.0 lb

## 2014-10-14 DIAGNOSIS — E785 Hyperlipidemia, unspecified: Secondary | ICD-10-CM

## 2014-10-14 DIAGNOSIS — Z87891 Personal history of nicotine dependence: Secondary | ICD-10-CM

## 2014-10-14 DIAGNOSIS — Z9889 Other specified postprocedural states: Secondary | ICD-10-CM

## 2014-10-14 DIAGNOSIS — I5032 Chronic diastolic (congestive) heart failure: Secondary | ICD-10-CM | POA: Diagnosis not present

## 2014-10-14 DIAGNOSIS — I4891 Unspecified atrial fibrillation: Secondary | ICD-10-CM

## 2014-10-14 DIAGNOSIS — I8393 Asymptomatic varicose veins of bilateral lower extremities: Secondary | ICD-10-CM

## 2014-10-14 DIAGNOSIS — I5033 Acute on chronic diastolic (congestive) heart failure: Secondary | ICD-10-CM

## 2014-10-14 DIAGNOSIS — Z954 Presence of other heart-valve replacement: Secondary | ICD-10-CM

## 2014-10-14 DIAGNOSIS — J438 Other emphysema: Secondary | ICD-10-CM | POA: Diagnosis not present

## 2014-10-14 DIAGNOSIS — K432 Incisional hernia without obstruction or gangrene: Secondary | ICD-10-CM

## 2014-10-14 DIAGNOSIS — Z8679 Personal history of other diseases of the circulatory system: Secondary | ICD-10-CM

## 2014-10-14 DIAGNOSIS — Z952 Presence of prosthetic heart valve: Secondary | ICD-10-CM

## 2014-10-14 DIAGNOSIS — I8390 Asymptomatic varicose veins of unspecified lower extremity: Secondary | ICD-10-CM

## 2014-10-14 NOTE — Progress Notes (Signed)
Patient ID: Nicole [name], female   DOB: 11-23-1935, 79 y.o.   MRN: [id number]      SUBJECTIVE: Ms. Nicole [name] presents for routine follow up. She has a history of COPD (52 pack years quit 11-28-2012), atrial fibrillation s/p AVR with pericardial valve, mitral annuloplasty, MAZE procedure and clipping of the LA appendage (19/3790) and diastolic CHF.    In summary, she was admitted for recurrent atrial fibrillation in the Fall of 2014 and was found to have severe AS and moderate MR. 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  She wore an event monitor between January and February 2015 which did not demonstrate any evidence of atrial fibrillation. She is no longer on anticoagulation.  She is doing well and denies chest pain, palpitations, shortness of breath, dizziness, and leg swelling. She tried using an Ace wrap for her abdominal wall hernia but this caused her to have nausea. Since she has quit smoking, she says she eats a lot and has put on 40 pounds.  ECG performed in the office today demonstrates a mostly regular rhythm with very small and sometimes indiscernible P waves. This may represent sinus rhythm with PACs and sinus arrhythmia versus atrial fibrillation.  Soc: She used to be an Haematologist for 31 years. Daughter, Nicole [name], is a CCU nurse at [employer].   Review of Systems: As per "subjective", otherwise negative.  No Known Allergies  Current Outpatient Prescriptions  Medication Sig Dispense Refill  . ALPRAZolam (XANAX) 0.25 MG tablet Take 1 tablet by mouth 2 (two) times daily as needed.    Marland Kitchen atorvastatin (LIPITOR) 40 MG tablet Take 40 mg by mouth every evening.     Marland Kitchen CARTIA XT 120 MG 24 hr capsule TAKE ONE CAPSULE BY MOUTH ONE TIME DAILY  30 capsule 5  . Fluticasone Furoate-Vilanterol 100-25 MCG/INH AEPB Inhale 1 puff into the lungs daily.    . furosemide (LASIX) 40 MG tablet TAKE ONE TABLET BY MOUTH TWICE DAILY   60 tablet 6  . ipratropium-albuterol (DUONEB) 0.5-2.5 (3) MG/3ML SOLN Take 3 mLs by nebulization every 6 (six) hours as needed.    . Multiple Vitamins-Minerals (MULTIVITAMIN WITH MINERALS) tablet Take 1 tablet by mouth daily.    . potassium chloride SA (K-DUR,KLOR-CON) 20 MEQ tablet Take 1 tablet (20 mEq total) by mouth daily. 30 tablet 6  . sertraline (ZOLOFT) 25 MG tablet Take 25 mg by mouth daily.    Marland Kitchen tiotropium (SPIRIVA) 18 MCG inhalation capsule Place 18 mcg into inhaler and inhale daily.     No current facility-administered medications for this visit.    Past Medical History  Diagnosis Date  . SOB (shortness of breath)     conplicated by underlying a fib with RVR,COPD, and CHF  . Atrial fibrillation     with Rapid Ventricular response  . Anxiety   . Hyperlipidemia   . COPD (chronic obstructive pulmonary disease)   . Diastolic CHF     Past Surgical History  Procedure Laterality Date  . Abdominal hysterectomy    . Cataract extraction    . Tonsillectomy    . Rotator cuff repair    . Breast biopsy      x3  . Cardioversion N/A 02/13/2013    Procedure: CARDIOVERSION;  Surgeon: [name], MD;  Location: [facility] ENDOSCOPY;  Service: Cardiovascular;  Laterality: N/A;  . Tee without cardioversion N/A 03/01/2013    Procedure: TRANSESOPHAGEAL ECHOCARDIOGRAM (TEE);  Surgeon: [name],  MD;  Location: [place];  Service: Cardiovascular;  Laterality: N/A;  talked to bev. booking no. is [phone] called trish to verify time  . Cardioversion N/A 03/01/2013    Procedure: CARDIOVERSION;  Surgeon: [name], MD;  Location: [place];  Service: Cardiovascular;  Laterality: N/A;  . Aortic valve replacement N/A 03/05/2013    Procedure: AORTIC VALVE REPLACEMENT (AVR);  Surgeon: Gaye Pollack, MD;  Location: [place];  Service: Open Heart Surgery;  Laterality: N/A;  . Maze N/A 03/05/2013    Procedure: MAZE;  Surgeon: Gaye Pollack, MD;  Location: [place];  Service: Open Heart Surgery;   Laterality: N/A;  . Intraoperative transesophageal echocardiogram N/A 03/05/2013    Procedure: INTRAOPERATIVE TRANSESOPHAGEAL ECHOCARDIOGRAM;  Surgeon: Gaye Pollack, MD;  Location: [facility] OR;  Service: Open Heart Surgery;  Laterality: N/A;  . Mitral valve repair N/A 03/05/2013    Procedure: MITRAL VALVE REPAIR (MVR);  Surgeon: Gaye Pollack, MD;  Location: [place];  Service: Open Heart Surgery;  Laterality: N/A;  . Left and right heart catheterization with coronary angiogram N/A 03/02/2013    Procedure: LEFT AND RIGHT HEART CATHETERIZATION WITH CORONARY ANGIOGRAM;  Surgeon: [name], MD;  Location: [facility] CATH LAB;  Service: Cardiovascular;  Laterality: N/A;    History   Social History  . Marital Status: Single    Spouse Name: N/A  . Number of Children: N/A  . Years of Education: N/A   Occupational History  . Not on file.   Social History Main Topics  . Smoking status: Former Smoker -- 1.00 packs/day for 25 years    Types: Cigarettes    Start date: 06/12/1955    Quit date: 11/28/2012  . Smokeless tobacco: Never Used     Comment: Quit in July 2014  . Alcohol Use: No  . Drug Use: No  . Sexual Activity: No   Other Topics Concern  . Not on file   Social History Narrative     Filed Vitals:   10/14/14 0803  BP: 109/71  Pulse: 69  Height: 5\' 3"  (1.6 m)  Weight: 209 lb (94.802 kg)  SpO2: 95%    PHYSICAL EXAM General: NAD  Neck: No JVD, no thyromegaly or thyroid nodule.  Lungs: Diminished b/l, no rales or wheezes.  CV: Well healed midline sternotomy. Regular rate and rhythm, normal S1, S2 crisp. 1/6 SEM at RSB. 1/6 SEM at LSB No MR. No carotid bruit. Normal pedal pulses.  Abdomen: Midline incisional hernia. Soft, nontender, no hepatosplenomegaly, mild epigastric distention.  Skin: Intact without lesions or rashes.  Neurologic: Alert and oriented x 3.  Psych: Normal affect.  Extremities: No edema. Bilateral venous varicosities. HEENT: Normal.   ECG: Most recent  ECG reviewed.      ASSESSMENT AND PLAN: 1. Valvular heart disease: Severe aortic stenosis & moderate MR - s/p AVR (tissue) and MV ring with Maze procedure 03/15/13. She is symptomatically stable. No change in therapy indicated.   2. Atrial fibrillation - s/p Maze. ECG results noted above. Event monitoring in early 2015 did not demonstrate any evidence of atrial fibrillation, thus she is no longer on anticoagulation. Currently taking long-acting diltiazem 120 mg daily.  I will obtain a 48 hour Holter monitor to see whether or not she has developed a recurrence of atrial fibrillation, in which case she would require anticoagulation.  3. Chronic diastolic CHF: Currently with NYHA class I-II symptoms. She is currently compensated and euvolemic. She checks her weight daily and adheres  to a low-sodium diet. Currently taking Lasix 40 mg twice daily with potassium supplementation.  She has been innstructed to call the HF clinic if weight increases more than 3 lbs overnight or 5 lbs in a week.   4. COPD: PFT's showed moderately reduced DLCO. Previously referred to Dr. Luan Pulling, pulmonologist in [place].   5. Hyperlipidemia: Currently taking Lipitor 40 mg daily.   6. Bilateral leg swelling/venous varicosities: Instructed to wear knee-high compression stockings.  7. Midline incisional hernia: Conservatively managed. No significant tenderness.   Dispo: f/u 6 months.   Kate Sable, M.D., F.A.C.C.

## 2014-10-14 NOTE — Addendum Note (Signed)
Addended by: Julian Hy T on: 10/14/2014 09:28 AM   Modules accepted: Orders, Level of Service

## 2014-10-14 NOTE — Patient Instructions (Addendum)
Your physician wants you to follow-up in: 6 months with Dr. Virgina Jock will receive a reminder letter in the mail two months in advance. If you don't receive a letter, please call our office to schedule the follow-up appointment.  Your physician recommends that you continue on your current medications as directed. Please refer to the Current Medication list given to you today.  Your physician has recommended that you wear a holter monitor for 24 hrs. Holter monitors are medical devices that record the heart's electrical activity. Doctors most often use these monitors to diagnose arrhythmias. Arrhythmias are problems with the speed or rhythm of the heartbeat. The monitor is a small, portable device. You can wear one while you do your normal daily activities. This is usually used to diagnose what is causing palpitations/syncope (passing out).    Thank you for choosing [facility]!!

## 2014-10-24 DIAGNOSIS — F419 Anxiety disorder, unspecified: Secondary | ICD-10-CM | POA: Diagnosis not present

## 2014-10-24 DIAGNOSIS — J449 Chronic obstructive pulmonary disease, unspecified: Secondary | ICD-10-CM | POA: Diagnosis not present

## 2014-10-24 DIAGNOSIS — E669 Obesity, unspecified: Secondary | ICD-10-CM | POA: Diagnosis not present

## 2014-10-24 DIAGNOSIS — E785 Hyperlipidemia, unspecified: Secondary | ICD-10-CM | POA: Diagnosis not present

## 2014-10-31 ENCOUNTER — Telehealth: Payer: Self-pay | Admitting: *Deleted

## 2014-10-31 DIAGNOSIS — I5032 Chronic diastolic (congestive) heart failure: Secondary | ICD-10-CM

## 2014-10-31 DIAGNOSIS — I4891 Unspecified atrial fibrillation: Secondary | ICD-10-CM

## 2014-10-31 NOTE — Telephone Encounter (Signed)
24 hour holter results -   Sustained atrial fibrillation with RVR -    RE: Import    Herminio Commons, MD   Sent: Tue Oct 29, 2014 3:17 PM   To: Laurine Blazer, LPN     Message    She needs anticoagulation and additional rate slowing. Please obtain BMET and CBC.   Have her start Xarelto 20 mg daily.    I will likely need to slow her rate further, and I may use digoxin.   Have her f/u with me within next 2-4 weeks.

## 2014-10-31 NOTE — Telephone Encounter (Signed)
Patient notified.  She will come to office in the morning to pick up samples & prescriptions.  She will bring her daughter who is a nurse to receive information as well.  Will also give lab orders & she can do at [facility] since she will already be here in [place].  (lives in [place])

## 2014-11-01 DIAGNOSIS — I4891 Unspecified atrial fibrillation: Secondary | ICD-10-CM | POA: Diagnosis not present

## 2014-11-01 DIAGNOSIS — I5032 Chronic diastolic (congestive) heart failure: Secondary | ICD-10-CM | POA: Diagnosis not present

## 2014-11-01 MED ORDER — RIVAROXABAN 20 MG PO TABS: 20.0000 mg | ORAL_TABLET | Freq: Every day | ORAL | Status: DC

## 2014-11-01 MED ORDER — DILTIAZEM HCL ER COATED BEADS 120 MG PO CP24: 240.0000 mg | ORAL_CAPSULE | Freq: Every day | ORAL | Status: DC

## 2014-11-01 NOTE — Telephone Encounter (Signed)
Can increase Cartia XT to 240 mg daily. Will review labs once completed. She can be seen next week if I have an opening, but have her increase dose today.

## 2014-11-01 NOTE — Telephone Encounter (Signed)
Patient & daughter in office this morning.  Instructions given below.  Daughter is concerned about her elevated heart rate.  States that she does have notable SOB.  She questions adding rate control prior to her follow up in the office.  I have scheduled her for 11/27/2014.  Daughter also questions if she should be seen sooner?  Patient also states that she will get her labs done at [place] office.   Informed patient & daughter that message will be sent to provider for further advice.

## 2014-11-01 NOTE — Telephone Encounter (Signed)
Follow up scheduled for 11/25/2014 at 2:00 with Dr. Bronson Ing.

## 2014-11-01 NOTE — Telephone Encounter (Signed)
Patient notified.  Instructed to take 2 tabs of her 120mg  capsule on the Cartia XT to equal the 240mg  daily for now.  Stated she did her labs today at her [facility] office.  Will reschedule for Tuesday, 11/05/14 with Dr. Bronson Ing.  Previous OV scheduled for 11/25/14 has been cancelled & patient aware.

## 2014-11-05 ENCOUNTER — Ambulatory Visit (INDEPENDENT_AMBULATORY_CARE_PROVIDER_SITE_OTHER): Payer: Medicare Other | Admitting: Cardiovascular Disease

## 2014-11-05 ENCOUNTER — Encounter: Payer: Self-pay | Admitting: Cardiovascular Disease

## 2014-11-05 VITALS — BP 122/70 | HR 76 | Ht 63.0 in | Wt 202.0 lb

## 2014-11-05 DIAGNOSIS — I8393 Asymptomatic varicose veins of bilateral lower extremities: Secondary | ICD-10-CM

## 2014-11-05 DIAGNOSIS — Z87891 Personal history of nicotine dependence: Secondary | ICD-10-CM

## 2014-11-05 DIAGNOSIS — Z9889 Other specified postprocedural states: Secondary | ICD-10-CM

## 2014-11-05 DIAGNOSIS — J438 Other emphysema: Secondary | ICD-10-CM

## 2014-11-05 DIAGNOSIS — I4819 Other persistent atrial fibrillation: Secondary | ICD-10-CM

## 2014-11-05 DIAGNOSIS — Z8679 Personal history of other diseases of the circulatory system: Secondary | ICD-10-CM

## 2014-11-05 DIAGNOSIS — I5032 Chronic diastolic (congestive) heart failure: Secondary | ICD-10-CM | POA: Diagnosis not present

## 2014-11-05 DIAGNOSIS — E785 Hyperlipidemia, unspecified: Secondary | ICD-10-CM

## 2014-11-05 DIAGNOSIS — Z954 Presence of other heart-valve replacement: Secondary | ICD-10-CM | POA: Diagnosis not present

## 2014-11-05 DIAGNOSIS — I350 Nonrheumatic aortic (valve) stenosis: Secondary | ICD-10-CM

## 2014-11-05 DIAGNOSIS — Z952 Presence of prosthetic heart valve: Secondary | ICD-10-CM

## 2014-11-05 DIAGNOSIS — I8390 Asymptomatic varicose veins of unspecified lower extremity: Secondary | ICD-10-CM

## 2014-11-05 DIAGNOSIS — I481 Persistent atrial fibrillation: Secondary | ICD-10-CM

## 2014-11-05 DIAGNOSIS — F411 Generalized anxiety disorder: Secondary | ICD-10-CM

## 2014-11-05 MED ORDER — SERTRALINE HCL 50 MG PO TABS: 50.0000 mg | ORAL_TABLET | Freq: Every day | ORAL | Status: DC

## 2014-11-05 MED ORDER — DILTIAZEM HCL ER COATED BEADS 240 MG PO CP24: 240.0000 mg | ORAL_CAPSULE | Freq: Every day | ORAL | Status: DC

## 2014-11-05 NOTE — Progress Notes (Signed)
Patient ID: Nicole [name], female   DOB: 24-Jul-1935, 79 y.o.   MRN: [id number]      SUBJECTIVE: The patient presents for a focused visit to address rapid atrial fibrillation seen on Holter monitoring. I increased her long-acting diltiazem to 240 mg daily and started anticoagulation with Xarelto 20 mg daily.  Labs on 11/01/14 showed BUN 18, creatinine 0.99, potassium 4.3, hemoglobin 14.6.  She is feeling well and denies palpitations. She has intermittent and spontaneous episodes of shortness of breath as well as anxiety. She was started on Zoloft 25 mg daily 6 months ago by her PCP. She has not had any bleeding problems. She also denies dizziness and leg swelling.  She is here with [name], her daughter.  In summary, she has a history of COPD (52 pack years quit 11-28-2012), atrial fibrillation s/p AVR with pericardial valve, mitral annuloplasty, MAZE procedure and clipping of the LA appendage (19/5093) and diastolic CHF.   She was admitted for recurrent atrial fibrillation in the Fall of 2014 and was found to have severe AS and moderate MR. 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  She wore an event monitor between January and February 2015 which did not demonstrate any evidence of atrial fibrillation. She is no longer on anticoagulation.     Soc: She used to be an Haematologist for 31 years. Daughter, Heide Guile, is a CCU nurse at [employer].   Review of Systems: As per "subjective", otherwise negative.  No Known Allergies  Current Outpatient Prescriptions  Medication Sig Dispense Refill  . ALPRAZolam (XANAX) 0.25 MG tablet Take 1 tablet by mouth 2 (two) times daily as needed.    Marland Kitchen atorvastatin (LIPITOR) 40 MG tablet Take 40 mg by mouth every evening.     . diltiazem (CARTIA XT) 120 MG 24 hr capsule Take 2 capsules (240 mg total) by mouth daily.    . Fluticasone Furoate-Vilanterol 100-25 MCG/INH AEPB Inhale 1 puff into  the lungs daily.    . furosemide (LASIX) 40 MG tablet TAKE ONE TABLET BY MOUTH TWICE DAILY  60 tablet 6  . ipratropium-albuterol (DUONEB) 0.5-2.5 (3) MG/3ML SOLN Take 3 mLs by nebulization every 6 (six) hours as needed.    . Multiple Vitamins-Minerals (MULTIVITAMIN WITH MINERALS) tablet Take 1 tablet by mouth daily.    . potassium chloride SA (K-DUR,KLOR-CON) 20 MEQ tablet Take 1 tablet (20 mEq total) by mouth daily. 30 tablet 6  . rivaroxaban (XARELTO) 20 MG TABS tablet Take 1 tablet (20 mg total) by mouth daily with supper. 30 tablet 6  . sertraline (ZOLOFT) 25 MG tablet Take 25 mg by mouth daily.    Marland Kitchen tiotropium (SPIRIVA) 18 MCG inhalation capsule Place 18 mcg into inhaler and inhale daily.     No current facility-administered medications for this visit.    Past Medical History  Diagnosis Date  . SOB (shortness of breath)     conplicated by underlying a fib with RVR,COPD, and CHF  . Atrial fibrillation     with Rapid Ventricular response  . Anxiety   . Hyperlipidemia   . COPD (chronic obstructive pulmonary disease)   . Diastolic CHF     Past Surgical History  Procedure Laterality Date  . Abdominal hysterectomy    . Cataract extraction    . Tonsillectomy    . Rotator cuff repair    . Breast biopsy      x3  . Cardioversion N/A 02/13/2013  Procedure: CARDIOVERSION;  Surgeon: [name], MD;  Location: [facility] ENDOSCOPY;  Service: Cardiovascular;  Laterality: N/A;  . Tee without cardioversion N/A 03/01/2013    Procedure: TRANSESOPHAGEAL ECHOCARDIOGRAM (TEE);  Surgeon: [name], MD;  Location: [place];  Service: Cardiovascular;  Laterality: N/A;  talked to bev. booking no. is [phone] called trish to verify time  . Cardioversion N/A 03/01/2013    Procedure: CARDIOVERSION;  Surgeon: [name], MD;  Location: [place];  Service: Cardiovascular;  Laterality: N/A;  . Aortic valve replacement N/A 03/05/2013    Procedure: AORTIC VALVE REPLACEMENT (AVR);  Surgeon:  Gaye Pollack, MD;  Location: [place];  Service: Open Heart Surgery;  Laterality: N/A;  . Maze N/A 03/05/2013    Procedure: MAZE;  Surgeon: Gaye Pollack, MD;  Location: [place];  Service: Open Heart Surgery;  Laterality: N/A;  . Intraoperative transesophageal echocardiogram N/A 03/05/2013    Procedure: INTRAOPERATIVE TRANSESOPHAGEAL ECHOCARDIOGRAM;  Surgeon: Gaye Pollack, MD;  Location: [facility] OR;  Service: Open Heart Surgery;  Laterality: N/A;  . Mitral valve repair N/A 03/05/2013    Procedure: MITRAL VALVE REPAIR (MVR);  Surgeon: Gaye Pollack, MD;  Location: [place];  Service: Open Heart Surgery;  Laterality: N/A;  . Left and right heart catheterization with coronary angiogram N/A 03/02/2013    Procedure: LEFT AND RIGHT HEART CATHETERIZATION WITH CORONARY ANGIOGRAM;  Surgeon: [name], MD;  Location: [facility] CATH LAB;  Service: Cardiovascular;  Laterality: N/A;    History   Social History  . Marital Status: Single    Spouse Name: N/A  . Number of Children: N/A  . Years of Education: N/A   Occupational History  . Not on file.   Social History Main Topics  . Smoking status: Former Smoker -- 1.00 packs/day for 63 years    Types: Cigarettes    Start date: 06/12/1955    Quit date: 11/28/2012  . Smokeless tobacco: Never Used     Comment: Quit in July 2014  . Alcohol Use: No  . Drug Use: No  . Sexual Activity: No   Other Topics Concern  . Not on file   Social History Narrative     Filed Vitals:   11/05/14 1258  BP: 122/70  Pulse: 76  Height: 5\' 3"  (1.6 m)  Weight: 202 lb (91.627 kg)  SpO2: 96%    PHYSICAL EXAM General: NAD  Neck: No JVD, no thyromegaly or thyroid nodule.  Lungs: Diminished b/l, no rales or wheezes.  CV: Well healed midline sternotomy. Regular rate and rhythm with occasional premature contractions, normal S1, S2 crisp. 1/6 SEM at RSB. 1/6 SEM at LSB No MR. Normal pedal pulses.  Abdomen: Midline incisional hernia. Soft, nontender, no hepatosplenomegaly,  mild epigastric distention.  Skin: Intact without lesions or rashes.  Neurologic: Alert and oriented x 3.  Psych: Normal affect.  Extremities: No edema. Bilateral venous varicosities. HEENT: Normal.   ECG: Most recent ECG reviewed.      ASSESSMENT AND PLAN: 1. Valvular heart disease: Severe aortic stenosis & moderate MR - s/p AVR (tissue) and MV ring with Maze procedure 03/15/13. She is symptomatically stable. No change in therapy indicated.   2. Rapid atrial fibrillation: Recurrence after Maze procedure. Recently increased long-acting diltiazem to 240 mg daily and initiated anticoagulation with Xarelto 20 mg. No changes.  3. Chronic diastolic CHF: Currently with NYHA class I-II symptoms. She is currently compensated and euvolemic. She checks her weight daily and adheres to a low-sodium  diet. Currently taking Lasix 40 mg twice daily with potassium supplementation.  She has been innstructed to call the HF clinic if weight increases more than 3 lbs overnight or 5 lbs in a week.   4. COPD: PFT's showed moderately reduced DLCO. Previously referred to Dr. Luan Pulling, pulmonologist in [place].   5. Hyperlipidemia: Currently taking Lipitor 40 mg daily.   6. Bilateral leg swelling/venous varicosities: Instructed to wear knee-high compression stockings.  7. Midline incisional hernia: Conservatively managed. No significant tenderness.   8. Anxiety: Will increase Zoloft to 50 mg daily.  Dispo: f/u 3 months.   Kate Sable, M.D., F.A.C.C.

## 2014-11-05 NOTE — Patient Instructions (Signed)
   Increase Zoloft to 50mg  daily  Continue the Diltiazem CD at the 240mg  daily dosing.  Printed scripts given on both medications above.  Continue all other medications.   Follow up in  3 months

## 2014-11-25 ENCOUNTER — Ambulatory Visit: Payer: Medicare Other | Admitting: Cardiovascular Disease

## 2014-11-27 ENCOUNTER — Ambulatory Visit: Payer: Medicare Other | Admitting: Cardiovascular Disease

## 2014-11-28 ENCOUNTER — Other Ambulatory Visit: Payer: Self-pay | Admitting: *Deleted

## 2014-11-28 MED ORDER — RIVAROXABAN 20 MG PO TABS: 20.0000 mg | ORAL_TABLET | Freq: Every day | ORAL | Status: DC

## 2014-12-09 ENCOUNTER — Other Ambulatory Visit (HOSPITAL_COMMUNITY): Payer: Self-pay | Admitting: Internal Medicine

## 2015-01-23 DIAGNOSIS — E669 Obesity, unspecified: Secondary | ICD-10-CM | POA: Diagnosis not present

## 2015-01-23 DIAGNOSIS — J449 Chronic obstructive pulmonary disease, unspecified: Secondary | ICD-10-CM | POA: Diagnosis not present

## 2015-01-23 DIAGNOSIS — I4891 Unspecified atrial fibrillation: Secondary | ICD-10-CM | POA: Diagnosis not present

## 2015-01-23 DIAGNOSIS — F419 Anxiety disorder, unspecified: Secondary | ICD-10-CM | POA: Diagnosis not present

## 2015-01-31 ENCOUNTER — Encounter: Payer: Self-pay | Admitting: Cardiovascular Disease

## 2015-01-31 ENCOUNTER — Ambulatory Visit (INDEPENDENT_AMBULATORY_CARE_PROVIDER_SITE_OTHER): Payer: Medicare Other | Admitting: Cardiovascular Disease

## 2015-01-31 VITALS — BP 138/80 | HR 76 | Ht 63.0 in | Wt 211.0 lb

## 2015-01-31 DIAGNOSIS — I481 Persistent atrial fibrillation: Secondary | ICD-10-CM

## 2015-01-31 DIAGNOSIS — K432 Incisional hernia without obstruction or gangrene: Secondary | ICD-10-CM

## 2015-01-31 DIAGNOSIS — Z8679 Personal history of other diseases of the circulatory system: Secondary | ICD-10-CM

## 2015-01-31 DIAGNOSIS — J438 Other emphysema: Secondary | ICD-10-CM

## 2015-01-31 DIAGNOSIS — I5032 Chronic diastolic (congestive) heart failure: Secondary | ICD-10-CM

## 2015-01-31 DIAGNOSIS — Z9889 Other specified postprocedural states: Secondary | ICD-10-CM

## 2015-01-31 DIAGNOSIS — I2510 Atherosclerotic heart disease of native coronary artery without angina pectoris: Secondary | ICD-10-CM | POA: Diagnosis not present

## 2015-01-31 DIAGNOSIS — Z87891 Personal history of nicotine dependence: Secondary | ICD-10-CM

## 2015-01-31 DIAGNOSIS — Z954 Presence of other heart-valve replacement: Secondary | ICD-10-CM

## 2015-01-31 DIAGNOSIS — I8393 Asymptomatic varicose veins of bilateral lower extremities: Secondary | ICD-10-CM | POA: Diagnosis not present

## 2015-01-31 DIAGNOSIS — R0602 Shortness of breath: Secondary | ICD-10-CM | POA: Diagnosis not present

## 2015-01-31 DIAGNOSIS — Z952 Presence of prosthetic heart valve: Secondary | ICD-10-CM

## 2015-01-31 DIAGNOSIS — E785 Hyperlipidemia, unspecified: Secondary | ICD-10-CM

## 2015-01-31 DIAGNOSIS — I8390 Asymptomatic varicose veins of unspecified lower extremity: Secondary | ICD-10-CM

## 2015-01-31 DIAGNOSIS — I4819 Other persistent atrial fibrillation: Secondary | ICD-10-CM

## 2015-01-31 NOTE — Progress Notes (Signed)
Patient ID: Nicole [name], female   DOB: July 22, 1935, 79 y.o.   MRN: [id number]      SUBJECTIVE: The patient returns for routine cardiovascular follow-up. She said she feels like she is doing well but her daughter, [name], worries about her quite a bit. Her daughter reportedly told her she may need an atrial fibrillation ablation. The patient does not appear to be interested in this and said she cried when this was suggested to her by [facility].   She denies palpitations. She has a history of COPD and gets short of breath if she has to climb a flight of stairs or if she walks too quickly. She said if she walks slowly and takes her time she is able to carry out her activities of daily living without limitations.   She denies exertional chest pain. She said she loves to sew and watch TV. She recently returned from a trip to [place] where she visited her granddaughter. She denies leg swelling.   Review of Systems: As per "subjective", otherwise negative.  No Known Allergies  Current Outpatient Prescriptions  Medication Sig Dispense Refill  . ALPRAZolam (XANAX) 0.25 MG tablet Take 1 tablet by mouth 2 (two) times daily as needed.    Marland Kitchen atorvastatin (LIPITOR) 40 MG tablet Take 40 mg by mouth every evening.     . diltiazem (CARDIZEM CD) 240 MG 24 hr capsule Take 1 capsule (240 mg total) by mouth daily. 90 capsule 3  . Fluticasone Furoate-Vilanterol 100-25 MCG/INH AEPB Inhale 1 puff into the lungs daily.    . furosemide (LASIX) 40 MG tablet TAKE ONE TABLET BY MOUTH TWICE DAILY  60 tablet 6  . ipratropium-albuterol (DUONEB) 0.5-2.5 (3) MG/3ML SOLN Take 3 mLs by nebulization every 6 (six) hours as needed.    . Multiple Vitamins-Minerals (MULTIVITAMIN WITH MINERALS) tablet Take 1 tablet by mouth daily.    . potassium chloride SA (K-DUR,KLOR-CON) 20 MEQ tablet TAKE ONE TABLET BY MOUTH ONE TIME DAILY 30 tablet 6  . rivaroxaban (XARELTO) 20 MG TABS tablet Take 1 tablet (20 mg total) by mouth daily with supper. 30  tablet 6  . tiotropium (SPIRIVA) 18 MCG inhalation capsule Place 18 mcg into inhaler and inhale daily.     No current facility-administered medications for this visit.    Past Medical History  Diagnosis Date  . SOB (shortness of breath)     conplicated by underlying a fib with RVR,COPD, and CHF  . Atrial fibrillation     with Rapid Ventricular response  . Anxiety   . Hyperlipidemia   . COPD (chronic obstructive pulmonary disease)   . Diastolic CHF     Past Surgical History  Procedure Laterality Date  . Abdominal hysterectomy    . Cataract extraction    . Tonsillectomy    . Rotator cuff repair    . Breast biopsy      x3  . Cardioversion N/A 02/13/2013    Procedure: CARDIOVERSION;  Surgeon: [name], MD;  Location: [facility] ENDOSCOPY;  Service: Cardiovascular;  Laterality: N/A;  . Tee without cardioversion N/A 03/01/2013    Procedure: TRANSESOPHAGEAL ECHOCARDIOGRAM (TEE);  Surgeon: [name], MD;  Location: [place];  Service: Cardiovascular;  Laterality: N/A;  talked to bev. booking no. is [id number] called trish to verify time  . Cardioversion N/A 03/01/2013    Procedure: CARDIOVERSION;  Surgeon: [name], MD;  Location: [place];  Service: Cardiovascular;  Laterality: N/A;  . Aortic valve replacement N/A 03/05/2013  Procedure: AORTIC VALVE REPLACEMENT (AVR);  Surgeon: Gaye Pollack, MD;  Location: [place];  Service: Open Heart Surgery;  Laterality: N/A;  . Maze N/A 03/05/2013    Procedure: MAZE;  Surgeon: Gaye Pollack, MD;  Location: [place];  Service: Open Heart Surgery;  Laterality: N/A;  . Intraoperative transesophageal echocardiogram N/A 03/05/2013    Procedure: INTRAOPERATIVE TRANSESOPHAGEAL ECHOCARDIOGRAM;  Surgeon: Gaye Pollack, MD;  Location: [facility] OR;  Service: Open Heart Surgery;  Laterality: N/A;  . Mitral valve repair N/A 03/05/2013    Procedure: MITRAL VALVE REPAIR (MVR);  Surgeon: Gaye Pollack, MD;  Location: [place];  Service: Open Heart Surgery;   Laterality: N/A;  . Left and right heart catheterization with coronary angiogram N/A 03/02/2013    Procedure: LEFT AND RIGHT HEART CATHETERIZATION WITH CORONARY ANGIOGRAM;  Surgeon: [name], MD;  Location: [facility] CATH LAB;  Service: Cardiovascular;  Laterality: N/A;    Social History   Social History  . Marital Status: Single    Spouse Name: N/A  . Number of Children: N/A  . Years of Education: N/A   Occupational History  . Not on file.   Social History Main Topics  . Smoking status: Former Smoker -- 1.00 packs/day for 64 years    Types: Cigarettes    Start date: 06/12/1955    Quit date: 11/28/2012  . Smokeless tobacco: Never Used     Comment: Quit in July 2014  . Alcohol Use: No  . Drug Use: No  . Sexual Activity: No   Other Topics Concern  . Not on file   Social History Narrative     Filed Vitals:   01/31/15 1053  BP: 138/80  Pulse: 76  Height: 5\' 3"  (1.6 m)  Weight: 211 lb (95.709 kg)  SpO2: 94%    PHYSICAL EXAM General: NAD  Neck: No JVD, no thyromegaly or thyroid nodule.  Lungs: Diminished b/l, no rales or wheezes.  CV: Well healed midline sternotomy. Regular rate and rhythm, normal S1, S2 crisp. 1/6 SEM at RSB. 1/6 SEM at LSB No MR.   Abdomen: Midline incisional hernia. Soft, nontender, no hepatosplenomegaly, mild epigastric distention.  Skin: Intact without lesions or rashes.  Neurologic: Alert and oriented x 3.  Psych: Normal affect.  Extremities: No edema. Bilateral venous varicosities. HEENT: Normal.   ECG: Most recent ECG reviewed.      ASSESSMENT AND PLAN: 1. Valvular heart disease: Severe aortic stenosis & moderate MR - s/p AVR (tissue) and MV ring with Maze procedure 03/15/13. She is symptomatically stable. No change in therapy indicated.   2. Persistent atrial fibrillation: Recurrence after Maze procedure.  Symptomatically stable and remains in a regular rhythm. Continue long-acting diltiazem 240 mg daily and Xarelto 20 mg.  She is not interested in ablation.  3. Chronic diastolic CHF: Currently with NYHA class I-II symptoms. She is currently compensated and euvolemic. She checks her weight daily and adheres to a low-sodium diet. Currently taking Lasix 40 mg twice daily with potassium supplementation.  She has been innstructed to call the HF clinic if weight increases more than 3 lbs overnight or 5 lbs in a week.   4. COPD: PFT's showed moderately reduced DLCO.  Follows with Dr. Luan Pulling, pulmonologist in [place].   5. Hyperlipidemia: Currently taking Lipitor 40 mg daily.   6. Bilateral leg swelling/venous varicosities: Previously instructed to wear knee-high compression stockings.  7. Midline incisional hernia: Conservatively managed. Saw Dr. Cyndia Bent. No significant tenderness.   Dispo: f/u 6  months.   Kate Sable, M.D., F.A.C.C.

## 2015-01-31 NOTE — Patient Instructions (Signed)
Continue all current medications. Your physician wants you to follow up in: 6 months.  You will receive a reminder letter in the mail one-two months in advance.  If you don't receive a letter, please call our office to schedule the follow up appointment   

## 2015-02-05 DIAGNOSIS — E784 Other hyperlipidemia: Secondary | ICD-10-CM | POA: Diagnosis not present

## 2015-02-05 DIAGNOSIS — I10 Essential (primary) hypertension: Secondary | ICD-10-CM | POA: Diagnosis not present

## 2015-02-06 DIAGNOSIS — E782 Mixed hyperlipidemia: Secondary | ICD-10-CM | POA: Diagnosis not present

## 2015-02-06 DIAGNOSIS — I4891 Unspecified atrial fibrillation: Secondary | ICD-10-CM | POA: Diagnosis not present

## 2015-02-06 DIAGNOSIS — I10 Essential (primary) hypertension: Secondary | ICD-10-CM | POA: Diagnosis not present

## 2015-02-06 DIAGNOSIS — Z0000 Encounter for general adult medical examination without abnormal findings: Secondary | ICD-10-CM | POA: Diagnosis not present

## 2015-02-06 DIAGNOSIS — J449 Chronic obstructive pulmonary disease, unspecified: Secondary | ICD-10-CM | POA: Diagnosis not present

## 2015-02-21 ENCOUNTER — Encounter (HOSPITAL_COMMUNITY): Payer: Medicare Other

## 2015-03-31 ENCOUNTER — Other Ambulatory Visit: Payer: Self-pay

## 2015-03-31 MED ORDER — FUROSEMIDE 40 MG PO TABS: 40.0000 mg | ORAL_TABLET | Freq: Two times a day (BID) | ORAL | Status: DC

## 2015-04-01 DIAGNOSIS — H01021 Squamous blepharitis right upper eyelid: Secondary | ICD-10-CM | POA: Diagnosis not present

## 2015-04-01 DIAGNOSIS — H01004 Unspecified blepharitis left upper eyelid: Secondary | ICD-10-CM | POA: Diagnosis not present

## 2015-04-01 DIAGNOSIS — H01001 Unspecified blepharitis right upper eyelid: Secondary | ICD-10-CM | POA: Diagnosis not present

## 2015-04-01 DIAGNOSIS — H01024 Squamous blepharitis left upper eyelid: Secondary | ICD-10-CM | POA: Diagnosis not present

## 2015-05-22 DIAGNOSIS — I4891 Unspecified atrial fibrillation: Secondary | ICD-10-CM | POA: Diagnosis not present

## 2015-05-22 DIAGNOSIS — I509 Heart failure, unspecified: Secondary | ICD-10-CM | POA: Diagnosis not present

## 2015-05-22 DIAGNOSIS — E669 Obesity, unspecified: Secondary | ICD-10-CM | POA: Diagnosis not present

## 2015-05-22 DIAGNOSIS — J449 Chronic obstructive pulmonary disease, unspecified: Secondary | ICD-10-CM | POA: Diagnosis not present

## 2015-05-30 DIAGNOSIS — D481 Neoplasm of uncertain behavior of connective and other soft tissue: Secondary | ICD-10-CM | POA: Diagnosis not present

## 2015-06-12 ENCOUNTER — Telehealth: Payer: Self-pay | Admitting: *Deleted

## 2015-06-12 NOTE — Telephone Encounter (Signed)
Noted, will fax note back today.

## 2015-06-12 NOTE — Telephone Encounter (Signed)
Can hold 48 hrs beforehand.

## 2015-06-12 NOTE — Telephone Encounter (Signed)
Received fax from [facility].  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  Dr. Philbert Riser want to know if patient can hold Xarelto prior to procedure.

## 2015-06-17 ENCOUNTER — Encounter (HOSPITAL_COMMUNITY): Payer: Self-pay | Admitting: Internal Medicine

## 2015-06-17 ENCOUNTER — Ambulatory Visit (HOSPITAL_COMMUNITY)
Admission: RE | Admit: 2015-06-17 | Discharge: 2015-06-17 | Disposition: A | Discharge status: Home / Self Care | Payer: Medicare Other | Source: Ambulatory Visit | Attending: Internal Medicine | Admitting: Internal Medicine

## 2015-06-17 VITALS — BP 132/60 | HR 81 | Wt 210.2 lb

## 2015-06-17 DIAGNOSIS — J449 Chronic obstructive pulmonary disease, unspecified: Secondary | ICD-10-CM | POA: Diagnosis not present

## 2015-06-17 DIAGNOSIS — Z79899 Other long term (current) drug therapy: Secondary | ICD-10-CM | POA: Diagnosis not present

## 2015-06-17 DIAGNOSIS — R9431 Abnormal electrocardiogram [ECG] [EKG]: Secondary | ICD-10-CM | POA: Diagnosis not present

## 2015-06-17 DIAGNOSIS — I481 Persistent atrial fibrillation: Secondary | ICD-10-CM | POA: Diagnosis not present

## 2015-06-17 DIAGNOSIS — I5033 Acute on chronic diastolic (congestive) heart failure: Secondary | ICD-10-CM | POA: Diagnosis not present

## 2015-06-17 DIAGNOSIS — I350 Nonrheumatic aortic (valve) stenosis: Secondary | ICD-10-CM

## 2015-06-17 DIAGNOSIS — I5032 Chronic diastolic (congestive) heart failure: Secondary | ICD-10-CM

## 2015-06-17 DIAGNOSIS — I4819 Other persistent atrial fibrillation: Secondary | ICD-10-CM

## 2015-06-17 DIAGNOSIS — Z7901 Long term (current) use of anticoagulants: Secondary | ICD-10-CM | POA: Insufficient documentation

## 2015-06-17 LAB — COMPREHENSIVE METABOLIC PANEL
ALT: 30 U/L (ref 14–54)
AST: 27 U/L (ref 15–41)
Albumin: 4.3 g/dL (ref 3.5–5.0)
Alkaline Phosphatase: 73 U/L (ref 38–126)
Anion gap: 9 (ref 5–15)
BILIRUBIN TOTAL: 0.8 mg/dL (ref 0.3–1.2)
BUN: 13 mg/dL (ref 6–20)
CO2: 27 mmol/L (ref 22–32)
CREATININE: 0.95 mg/dL (ref 0.44–1.00)
Calcium: 9.6 mg/dL (ref 8.9–10.3)
Chloride: 106 mmol/L (ref 101–111)
GFR calc Af Amer: >60 mL/min (ref >60)
GFR, EST NON AFRICAN AMERICAN: 56 mL/min — AB (ref >60)
Glucose, Bld: 114 mg/dL — ABNORMAL HIGH (ref 65–99)
Potassium: 4 mmol/L (ref 3.5–5.1)
Sodium: 142 mmol/L (ref 135–145)
TOTAL PROTEIN: 7 g/dL (ref 6.5–8.1)

## 2015-06-17 LAB — CBC
HCT: 42.3 % (ref 36.0–46.0)
Hemoglobin: 14 g/dL (ref 12.0–15.0)
MCH: 29.2 pg (ref 26.0–34.0)
MCHC: 33.1 g/dL (ref 30.0–36.0)
MCV: 88.3 fL (ref 78.0–100.0)
PLATELETS: 159 10*3/uL (ref 150–400)
RBC: 4.79 MIL/uL (ref 3.87–5.11)
RDW: 13.2 % (ref 11.5–15.5)
WBC: 8.1 10*3/uL (ref 4.0–10.5)

## 2015-06-17 LAB — BRAIN NATRIURETIC PEPTIDE: B NATRIURETIC PEPTIDE 5: 192.4 pg/mL — AB (ref 0.0–100.0)

## 2015-06-17 MED ORDER — FUROSEMIDE 40 MG PO TABS: ORAL_TABLET | ORAL | Status: DC

## 2015-06-17 MED ORDER — AMIODARONE HCL 200 MG PO TABS: 200.0000 mg | ORAL_TABLET | Freq: Two times a day (BID) | ORAL | Status: DC

## 2015-06-17 NOTE — Patient Instructions (Signed)
Increase Furosemide (Lasix) to 60mg  (1.5 tabs) in AM and 40mg  (1 tab) in PM  Start Amiodarone 200mg  Twice daily   Labs today  Labs in 2 weeks at [facility]  Your physician has requested that you have an echocardiogram. Echocardiography is a painless test that uses sound waves to create images of your heart. It provides your doctor with information about the size and shape of your heart and how well your heart's chambers and valves are working. This procedure takes approximately one hour. There are no restrictions for this procedure.  Your physician recommends that you schedule a follow-up appointment in: 1 month

## 2015-06-17 NOTE — Progress Notes (Signed)
Patient ID: Nicole [name], female   DOB: 31-Jul-1935, 80 y.o.   MRN: [id number]  PCP: Dr. Lovena Le [facility] [name]) Cardiology: Dr. Jacinta [name] HF Cardiology: Dr. Aundra [name]  Nicole [name] is a 80 yo with history of COPD (38 pack years quit April 2014), atrial fibrillation s/p AVR with pericardial valve, mitral annuloplasty, MAZE procedure and clipping of the LA appendage (99991111) and diastolic CHF. Daughter is Nicole [name] (one of our CCU nurses).   She has not been seen in this office for a couple of years, has been following with Dr. Jacinta [name] in [place].  However, made appointment down here due to increased dyspnea.  It appears that she went back into atrial fibrillation around 5/16 and has been in persistent atrial fibrillation since that time.  She has been more short of breath since going back into atrial fibrillation.  She is unable to climb steps due to dyspnea.  She walks with a walker and is short of breath after about 100 feet.  She seems to think that this is fairly stable for her, but her daughter thinks that she is considerably worse.  No chest pain.  She is limited also by low back pain.  No orthopnea/PND.  No falls or lightheadedness.  She continues to abstain from smoking.    ECG: Atrial fibrillation  Labs (9/14): K 4, creatinine 0.9 => 1.04, HCT 33.7, TnI 0.21 (while hospitalized in [place]), BNP 158, TSH normal, AST normal, ALT 39 Labs (03/16/13):  K 3.9 Cr 1.0 Labs (6/16): K 4.3, creatinine 0.99, Hgb 14.6  PMH: 1. COPD: PFTs (5/15) with FEV1 61%, RVC 63%, ratio 96%, DLCO 50%.   2. Atrial fibrillation: First noted in 7/14.  DCCV in 8/14 to NSR.  Recurred by 9/14.  DCCV to NSR 9/14 but recurred. S/p Maze procedure 10/14.  Recurrent atrial fibrillation around 5/16, now persistent.  3. Hyperlipidemia 4. Diastolic CHF: Echo (123456) with EF 55-60%, grade II diastolic dysfunction, mild LVH, moderate to severe AS with mean gradient 33 mmHg/peak 49 mmHg and AVA 0.74 cm^2.  5. Severe AS/Moderate MR. 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 Normal coronaries cath 10/14 7. Ventral hernia.  8. Low back pain: Lumbar disc disease.   SH: Lives in [place], retired, nonsmoker (quit 7/14).  Lives alone.  Daughter come to appointments.   FH: No premature CAD.  Breast cancer.   ROS: All systems reviewed and negative except as per HPI.   Current Outpatient Prescriptions  Medication Sig Dispense Refill  . atorvastatin (LIPITOR) 40 MG tablet Take 40 mg by mouth every evening.     . diltiazem (CARDIZEM CD) 240 MG 24 hr capsule Take 1 capsule (240 mg total) by mouth daily. 90 capsule 3  . Fluticasone Furoate-Vilanterol 100-25 MCG/INH AEPB Inhale 1 puff into the lungs daily.    . furosemide (LASIX) 40 MG tablet Take 1.5 tabs in AM and 1 tab in PM 75 tablet 6  . ipratropium-albuterol (DUONEB) 0.5-2.5 (3) MG/3ML SOLN Take 3 mLs by nebulization every 6 (six) hours as needed.    . Multiple Vitamins-Minerals (MULTIVITAMIN WITH MINERALS) tablet Take 1 tablet by mouth daily.    . potassium chloride SA (K-DUR,KLOR-CON) 20 MEQ tablet TAKE ONE TABLET BY MOUTH ONE TIME DAILY 30 tablet 6  . rivaroxaban (XARELTO) 20 MG TABS tablet Take 1 tablet (20 mg total) by mouth daily with supper. 30 tablet 6  . sertraline (ZOLOFT) 50 MG tablet Take  50 mg by mouth daily.    Marland Kitchen tiotropium (SPIRIVA) 18 MCG inhalation capsule Place 18 mcg into inhaler and inhale daily.    Marland Kitchen ALPRAZolam (XANAX) 0.25 MG tablet Take 1 tablet by mouth 2 (two) times daily as needed. Reported on 06/17/2015    . amiodarone (PACERONE) 200 MG tablet Take 1 tablet (200 mg total) by mouth 2 (two) times daily. 60 tablet 3   No current facility-administered medications for this encounter.    Filed Vitals:   06/17/15 1150  BP: 132/60  Pulse: 81  Weight: 210 lb 3.2 oz (95.346 kg)  SpO2: 93%   Physical Exam: General: NAD Neck: JVP 8 cm prominent CV waves no thyromegaly or thyroid nodule.   Lungs: decreased throughout. Clear CV: Chest scar looks good. Irregular S1S2. 2/6 early SEM at RSB. No carotid bruit.  Normal pedal pulses.  Abdomen: obese. soft, nontender, no hepatosplenomegaly, no distention.  Skin: Intact without lesions or rashes.  Neurologic: Alert and oriented x 3.  Psych: Normal affect. Extremities: No clubbing or cyanosis. Trace ankle edema.  HEENT: Normal.   Assessment/Plan:  1. Valvular heart disease - Severe aortic stenosis and moderate MR, s/p AVR (tissue) and MV ring with Maze procedure 03/15/13.  - Needs echo, did not get post-op echo, will arrange.  2. Atrial fibrillation: s/p MAZE. She has gone back in atrial fibrillation that is now persistent.  She clearly feels worse in atrial fibrillation.   - HR controlled on diltiazem CD.  - Continue anticoagulation with Xarelto.  Check CBC today.  - Given increased symptoms, I do think that it would be reasonable to cardiovert her.  I will start her on amiodarone 200 mg bid.  She will have eye surgery on Friday and will hold Xarelto for 2 days for this.  After eye surgery, will plan DCCV after 1 month of anticoagulation.  To try to hold NSR, I will start her on amiodarone 200 mg bid. Will need to follow LFTs, TSH, lung functions.   - Given prior MAZE, she is probably not a good atrial fibrillation ablation candidate.  3. Chronic diastolic CHF: NYHA II-III symptoms. Suspect at least mild volume overload on exam.  - Increase Lasix to 60 qam, 40 qpm.  BMET/BNP today and will get BMET in 2 wks.  4. COPD: stable. She no longer is smoking.   I will see her back in about a month to arrange the DCCV.   Nicole [name] 06/18/2015

## 2015-06-20 DIAGNOSIS — L570 Actinic keratosis: Secondary | ICD-10-CM | POA: Diagnosis not present

## 2015-06-20 DIAGNOSIS — D485 Neoplasm of uncertain behavior of skin: Secondary | ICD-10-CM | POA: Diagnosis not present

## 2015-07-01 DIAGNOSIS — I5032 Chronic diastolic (congestive) heart failure: Secondary | ICD-10-CM | POA: Diagnosis not present

## 2015-07-01 DIAGNOSIS — R0602 Shortness of breath: Secondary | ICD-10-CM | POA: Diagnosis not present

## 2015-07-11 ENCOUNTER — Other Ambulatory Visit (HOSPITAL_COMMUNITY): Payer: Self-pay | Admitting: Cardiology

## 2015-07-18 ENCOUNTER — Encounter (HOSPITAL_COMMUNITY): Payer: Self-pay

## 2015-07-18 ENCOUNTER — Ambulatory Visit (HOSPITAL_BASED_OUTPATIENT_CLINIC_OR_DEPARTMENT_OTHER)
Admission: RE | Admit: 2015-07-18 | Discharge: 2015-07-18 | Disposition: A | Discharge status: Home / Self Care | Payer: Medicare Other | Source: Ambulatory Visit | Attending: Internal Medicine | Admitting: Internal Medicine

## 2015-07-18 ENCOUNTER — Ambulatory Visit (HOSPITAL_COMMUNITY)
Admission: RE | Admit: 2015-07-18 | Discharge: 2015-07-18 | Disposition: A | Discharge status: Home / Self Care | Payer: Medicare Other | Source: Ambulatory Visit | Attending: Internal Medicine | Admitting: Internal Medicine

## 2015-07-18 VITALS — BP 112/74 | HR 75 | Resp 18 | Wt 209.8 lb

## 2015-07-18 DIAGNOSIS — I480 Paroxysmal atrial fibrillation: Secondary | ICD-10-CM | POA: Diagnosis not present

## 2015-07-18 DIAGNOSIS — I517 Cardiomegaly: Secondary | ICD-10-CM | POA: Insufficient documentation

## 2015-07-18 DIAGNOSIS — I493 Ventricular premature depolarization: Secondary | ICD-10-CM | POA: Diagnosis not present

## 2015-07-18 DIAGNOSIS — I350 Nonrheumatic aortic (valve) stenosis: Secondary | ICD-10-CM | POA: Diagnosis not present

## 2015-07-18 DIAGNOSIS — I5032 Chronic diastolic (congestive) heart failure: Secondary | ICD-10-CM | POA: Diagnosis not present

## 2015-07-18 DIAGNOSIS — I4891 Unspecified atrial fibrillation: Secondary | ICD-10-CM | POA: Insufficient documentation

## 2015-07-18 DIAGNOSIS — Z953 Presence of xenogenic heart valve: Secondary | ICD-10-CM | POA: Diagnosis not present

## 2015-07-18 DIAGNOSIS — I313 Pericardial effusion (noninflammatory): Secondary | ICD-10-CM | POA: Insufficient documentation

## 2015-07-18 DIAGNOSIS — I5023 Acute on chronic systolic (congestive) heart failure: Secondary | ICD-10-CM | POA: Insufficient documentation

## 2015-07-18 DIAGNOSIS — R9431 Abnormal electrocardiogram [ECG] [EKG]: Secondary | ICD-10-CM | POA: Insufficient documentation

## 2015-07-18 LAB — COMPREHENSIVE METABOLIC PANEL
ALK PHOS: 71 U/L (ref 38–126)
ALT: 43 U/L (ref 14–54)
ANION GAP: 10 (ref 5–15)
AST: 27 U/L (ref 15–41)
Albumin: 4.1 g/dL (ref 3.5–5.0)
BILIRUBIN TOTAL: 0.9 mg/dL (ref 0.3–1.2)
BUN: 17 mg/dL (ref 6–20)
CALCIUM: 9.1 mg/dL (ref 8.9–10.3)
CO2: 26 mmol/L (ref 22–32)
Chloride: 105 mmol/L (ref 101–111)
Creatinine, Ser: 1.04 mg/dL — ABNORMAL HIGH (ref 0.44–1.00)
GFR calc non Af Amer: 50 mL/min — ABNORMAL LOW (ref >60)
GFR, EST AFRICAN AMERICAN: 58 mL/min — AB (ref >60)
Glucose, Bld: 128 mg/dL — ABNORMAL HIGH (ref 65–99)
Potassium: 3.5 mmol/L (ref 3.5–5.1)
SODIUM: 141 mmol/L (ref 135–145)
TOTAL PROTEIN: 6.9 g/dL (ref 6.5–8.1)

## 2015-07-18 LAB — CBC
HCT: 39.4 % (ref 36.0–46.0)
HEMOGLOBIN: 13.2 g/dL (ref 12.0–15.0)
MCH: 29.9 pg (ref 26.0–34.0)
MCHC: 33.5 g/dL (ref 30.0–36.0)
MCV: 89.1 fL (ref 78.0–100.0)
Platelets: 161 10*3/uL (ref 150–400)
RBC: 4.42 MIL/uL (ref 3.87–5.11)
RDW: 13.6 % (ref 11.5–15.5)
WBC: 8.7 10*3/uL (ref 4.0–10.5)

## 2015-07-18 LAB — BRAIN NATRIURETIC PEPTIDE: B NATRIURETIC PEPTIDE 5: 220.3 pg/mL — AB (ref 0.0–100.0)

## 2015-07-18 LAB — TSH: TSH: 0.063 u[IU]/mL — ABNORMAL LOW (ref 0.350–4.500)

## 2015-07-18 MED ORDER — PERFLUTREN LIPID MICROSPHERE
INTRAVENOUS | Status: AC
  Filled 2015-07-18: qty 10

## 2015-07-18 MED ORDER — AMIODARONE HCL 200 MG PO TABS: 200.0000 mg | ORAL_TABLET | Freq: Every day | ORAL | Status: DC

## 2015-07-18 MED ORDER — PERFLUTREN LIPID MICROSPHERE
1.0000 mL | Freq: Once | INTRAVENOUS | Status: AC
  Administered 2015-07-18: 4 mL via INTRAVENOUS

## 2015-07-18 NOTE — Progress Notes (Signed)
Echocardiogram 2D Echocardiogram with Definity has been performed.  Tresa Res 07/18/2015, 12:07 PM

## 2015-07-18 NOTE — Patient Instructions (Addendum)
DECREASE Amiodarone to 200 mg ONCE daily.  EKG today looks great!  Routine lab work today. Will notify you of abnormal results, otherwise no news is good news!  Follow up 3 weeks with Dr. Aundra Dubin.  Do the following things EVERYDAY: 1) Weigh yourself in the morning before breakfast. Write it down and keep it in a log. 2) Take your medicines as prescribed 3) Eat low salt foods-Limit salt (sodium) to 2000 mg per day.  4) Stay as active as you can everyday 5) Limit all fluids for the day to less than 2 liters

## 2015-07-18 NOTE — Progress Notes (Signed)
PIV 22g x 1 attempt started in [place] for definity. 4cc definity pushed per echo tech instruction. Patient tolerated well. PIV removed, clean dry gauze dressing aplied to site.  Renee Pain

## 2015-07-19 ENCOUNTER — Other Ambulatory Visit (HOSPITAL_COMMUNITY): Payer: Self-pay | Admitting: Internal Medicine

## 2015-07-19 ENCOUNTER — Other Ambulatory Visit: Payer: Self-pay | Admitting: Cardiovascular Disease

## 2015-07-20 NOTE — Progress Notes (Signed)
Patient ID: Nicole [name], female   DOB: Jan 13, 1936, 80 y.o.   MRN: [id number]  PCP: Dr. Lovena Le [facility]) Cardiology: Dr. Jacinta Shoe HF Cardiology: Dr. Aundra Dubin  Ms Nicole [name] is a 80 yo with history of COPD (44 pack years quit April 2014), atrial fibrillation s/p AVR with pericardial valve, mitral annuloplasty, MAZE procedure and clipping of the LA appendage (99991111) and diastolic CHF. Daughter is Personnel officer (CCU nurse).   It appears that she went back into atrial fibrillation around 5/16 and has been in persistent atrial fibrillation since that time.  At last appointment, she was short of breath with volume overload on exam. She was in atrial fibrillation.  Given increased symptoms when in atrial fibrillation, I started her on amiodarone and planned for DCCV after amiodarone load if she remained in atrial fibrillation.  I increased Lasix as well.  Today, she feels much better.  She is walking without a walker now.  She is short of breath after walking 50-100 yards, improved.  No lightheadedness. No chest pain.  No orthopnea/PND.   ECG: Junctional rhythm at 75  Labs (9/14): K 4, creatinine 0.9 => 1.04, HCT 33.7, TnI 0.21 (while hospitalized in [place]), BNP 158, TSH normal, AST normal, ALT 39 Labs (03/16/13):  K 3.9 Cr 1.0 Labs (6/16): K 4.3, creatinine 0.99, Hgb 14.6 Labs (1/17): K 4.3, creatinine 1.08  PMH: 1. COPD: PFTs (5/15) with FEV1 61%, RVC 63%, ratio 96%, DLCO 50%.   2. Atrial fibrillation: First noted in 7/14.  DCCV in 8/14 to NSR.  Recurred by 9/14.  DCCV to NSR 9/14 but recurred. S/p Maze procedure 10/14.  Recurrent atrial fibrillation around 5/16, now persistent.  3. Hyperlipidemia 4. Diastolic CHF: Echo (123456) with EF 55-60%, grade II diastolic dysfunction, mild LVH, moderate to severe AS with mean gradient 33 mmHg/peak 49 mmHg and AVA 0.74 cm^2.  Echo (2/17) with EF 60-65%, mild focal basal septal hypertrophy, bioprosthetic aortic valve appeared normal, s/p mitral valve repair with no  significant mitral stenosis.  5. Severe AS/Moderate MR. 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 Normal coronaries cath 10/14 7. Ventral hernia.  8. Low back pain: Lumbar disc disease.   SH: Lives in [place], retired, nonsmoker (quit 7/14).  Lives alone.  Daughter come to appointments.   FH: No premature CAD.  Breast cancer.   ROS: All systems reviewed and negative except as per HPI.   Current Outpatient Prescriptions  Medication Sig Dispense Refill  . ALPRAZolam (XANAX) 0.25 MG tablet Take 1 tablet by mouth 2 (two) times daily as needed. Reported on 06/17/2015    . amiodarone (PACERONE) 200 MG tablet Take 1 tablet (200 mg total) by mouth daily. 30 tablet 3  . atorvastatin (LIPITOR) 40 MG tablet Take 40 mg by mouth every evening.     . diltiazem (CARDIZEM CD) 240 MG 24 hr capsule Take 1 capsule (240 mg total) by mouth daily. 90 capsule 3  . Fluticasone Furoate-Vilanterol 100-25 MCG/INH AEPB Inhale 1 puff into the lungs daily.    . furosemide (LASIX) 40 MG tablet Take 1.5 tabs in AM and 1 tab in PM 75 tablet 6  . ipratropium-albuterol (DUONEB) 0.5-2.5 (3) MG/3ML SOLN Take 3 mLs by nebulization every 6 (six) hours as needed.    . Multiple Vitamins-Minerals (MULTIVITAMIN WITH MINERALS) tablet Take 1 tablet by mouth daily.    . potassium chloride SA (K-DUR,KLOR-CON) 20 MEQ tablet TAKE ONE TABLET BY MOUTH  ONE TIME DAILY 30 tablet 6  . rivaroxaban (XARELTO) 20 MG TABS tablet Take 1 tablet (20 mg total) by mouth daily with supper. 30 tablet 6  . sertraline (ZOLOFT) 50 MG tablet Take 50 mg by mouth daily.    Marland Kitchen tiotropium (SPIRIVA) 18 MCG inhalation capsule Place 18 mcg into inhaler and inhale daily.     No current facility-administered medications for this encounter.    Filed Vitals:   07/18/15 1119  BP: 112/74  Pulse: 75  Resp: 18  Weight: 209 lb 12 oz (95.142 kg)  SpO2: 94%   Physical Exam: General: NAD Neck: JVP  7 cm prominent CV waves no thyromegaly or thyroid nodule.  Lungs: decreased throughout. Clear CV: Chest scar looks good. Regular S1S2. 2/6 early SEM at RSB. No carotid bruit.  Normal pedal pulses.  Abdomen: obese. soft, nontender, no hepatosplenomegaly, no distention.  Skin: Intact without lesions or rashes.  Neurologic: Alert and oriented x 3.  Psych: Normal affect. Extremities: No clubbing or cyanosis. No edema.   HEENT: Normal.   Assessment/Plan:  1. Valvular heart disease - Severe aortic stenosis and moderate MR, s/p AVR (tissue) and MV ring with Maze procedure 03/15/13.  - Aortic and mitral valves appeared stable on 2/17 echo.  2. Atrial fibrillation: s/p MAZE. She was back in atrial fibrillation with increased CHF symptoms at last appointment.  She is now on amiodarone and is in a regular junctional rhythm today.    - Continue anticoagulation with Xarelto.  We have discussed the lack of data regarding use of Xarelto with bioprosthetic valves.  She understands this but had a very hard time regulating INR with warfarin in the past and strongly wants to stay with DOAC.  I will not change.  - Rhythm more organized than prior to still not NSR => junctional rhythm. Decrease amiodarone to 200 mg daily and followup in 1 month to reassess rhythm.  Check LFTs and TSH today, she will need regular eye exams given amiodarone use.  - Given prior MAZE, she is probably not a good atrial fibrillation ablation candidate.  3. Chronic diastolic CHF: NYHA II-III symptoms, improved. She is not volume overloaded on exam.  Current exertional dyspnea is likely more due to COPD.  - Continue Lasix 60 qam, 40 qpm.  BMET/BNP today.  4. COPD: stable. She no longer is smoking.   Nicole [name] 07/20/2015

## 2015-07-21 MED FILL — Perflutren Lipid Microsphere IV Susp 1.1 MG/ML: INTRAVENOUS | Qty: 10 | Status: AC

## 2015-07-28 DIAGNOSIS — I480 Paroxysmal atrial fibrillation: Secondary | ICD-10-CM | POA: Diagnosis not present

## 2015-07-31 ENCOUNTER — Ambulatory Visit: Payer: Medicare Other | Admitting: Cardiovascular Disease

## 2015-08-01 ENCOUNTER — Telehealth (HOSPITAL_COMMUNITY): Payer: Self-pay

## 2015-08-01 NOTE — Telephone Encounter (Signed)
Recent lab work shows low TSH high Free T4.  Per Dr. Aundra Dubin, this may indicate possible hyperthyroidism secondary to [facility]. Asked patient to come in to clinic next week to be evaluated.  Patient will call CHF clinic when daughter can make arrangements to transport patient to appointment.  Nicole [name]

## 2015-08-05 ENCOUNTER — Ambulatory Visit (HOSPITAL_COMMUNITY)
Admission: RE | Admit: 2015-08-05 | Discharge: 2015-08-05 | Disposition: A | Discharge status: Home / Self Care | Payer: Medicare Other | Source: Ambulatory Visit | Attending: Cardiology | Admitting: Cardiology

## 2015-08-05 VITALS — BP 138/74 | HR 74 | Wt 210.5 lb

## 2015-08-05 DIAGNOSIS — Z7902 Long term (current) use of antithrombotics/antiplatelets: Secondary | ICD-10-CM | POA: Insufficient documentation

## 2015-08-05 DIAGNOSIS — E0590 Thyrotoxicosis, unspecified without thyrotoxic crisis or storm: Secondary | ICD-10-CM | POA: Diagnosis not present

## 2015-08-05 DIAGNOSIS — Z953 Presence of xenogenic heart valve: Secondary | ICD-10-CM | POA: Insufficient documentation

## 2015-08-05 DIAGNOSIS — Z79899 Other long term (current) drug therapy: Secondary | ICD-10-CM | POA: Diagnosis not present

## 2015-08-05 DIAGNOSIS — I4891 Unspecified atrial fibrillation: Secondary | ICD-10-CM | POA: Insufficient documentation

## 2015-08-05 DIAGNOSIS — I5032 Chronic diastolic (congestive) heart failure: Secondary | ICD-10-CM | POA: Insufficient documentation

## 2015-08-05 DIAGNOSIS — Z87891 Personal history of nicotine dependence: Secondary | ICD-10-CM | POA: Diagnosis not present

## 2015-08-05 DIAGNOSIS — E785 Hyperlipidemia, unspecified: Secondary | ICD-10-CM | POA: Diagnosis not present

## 2015-08-05 DIAGNOSIS — J449 Chronic obstructive pulmonary disease, unspecified: Secondary | ICD-10-CM | POA: Diagnosis not present

## 2015-08-05 NOTE — Patient Instructions (Signed)
Stop Amiodarone  You have been referred to Dr Dorris Fetch for Hyperthyroidism  Your physician recommends that you schedule a follow-up appointment in: 3 months

## 2015-08-05 NOTE — Progress Notes (Signed)
Patient ID: Nicole [name], female   DOB: [birth year], 80 y.o.   MRN: [id number]  PCP: Dr. Philipp Deputy [facility]) Cardiology: Dr. Jacinta Shoe HF Cardiology: Dr. Aundra Dubin  Nicole [name] is a 80 yo with history of COPD (59 pack years quit April 2014), atrial fibrillation s/p AVR with pericardial valve, mitral annuloplasty, MAZE procedure and clipping of the LA appendage (99991111) and diastolic CHF. Daughter is Personnel officer ([employer] nurse).   It appears that she went back into atrial fibrillation around 5/16 and has been in persistent atrial fibrillation since that time.  At last appointment, she was short of breath with volume overload on exam. She was in atrial fibrillation.  Given increased symptoms when in atrial fibrillation, I started her on amiodarone and planned for DCCV after amiodarone load if she remained in atrial fibrillation.  I increased Lasix as well.  She is no longer in atrial fibrillation but has been in a junctional rhythm the last 2 visits.   She is doing well on higher Lasix dose.  Weight is stable.  No orthopnea/PND.  She is short of breath walking long distances, this is her chronic pattern likely due to COPD.  No chest pain. Also of note, she has been noted to have developed hyperthyroidism.    ECG: Junctional rhythm at 61, inferior Qs  Labs (9/14): K 4, creatinine 0.9 => 1.04, HCT 33.7, TnI 0.21 (while hospitalized in [place]), BNP 158, TSH normal, AST normal, ALT 39 Labs (03/16/13):  K 3.9 Cr 1.0 Labs (6/16): K 4.3, creatinine 0.99, Hgb 14.6 Labs (1/17): K 4.3, creatinine 1.08 Labs (2/17): K 3.5, creatinine 1.04, BNP 220, free T3 normal, free T4 high, TSH 0.09, LFTs normal  PMH: 1. COPD: PFTs (5/15) with FEV1 61%, RVC 63%, ratio 96%, DLCO 50%.   2. Atrial fibrillation: First noted in 7/14.  DCCV in 8/14 to NSR.  Recurred by 9/14.  DCCV to NSR 9/14 but recurred. S/p Maze procedure 10/14.  Recurrent atrial fibrillation around 5/16, now persistent.  3. Hyperlipidemia 4. Diastolic CHF: Echo (123456)  with EF 55-60%, grade II diastolic dysfunction, mild LVH, moderate to severe AS with mean gradient 33 mmHg/peak 49 mmHg and AVA 0.74 cm^2.  Echo (2/17) with EF 60-65%, mild focal basal septal hypertrophy, bioprosthetic aortic valve appeared normal, s/p mitral valve repair with no significant mitral stenosis.  5. Severe AS/Moderate MR. 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 Normal coronaries cath 10/14 7. Ventral hernia.  8. Low back pain: Lumbar disc disease.  9. Hyperthyroidism: Likely related to amiodarone use.   SH: Lives in [place], retired, nonsmoker (quit 7/14).  Lives alone.  Daughter come to appointments.   FH: No premature CAD.  Breast cancer.   ROS: All systems reviewed and negative except as per HPI.   Current Outpatient Prescriptions  Medication Sig Dispense Refill  . ALPRAZolam (XANAX) 0.25 MG tablet Take 1 tablet by mouth 2 (two) times daily as needed. Reported on 06/17/2015    . atorvastatin (LIPITOR) 40 MG tablet Take 40 mg by mouth every evening.     . diltiazem (CARDIZEM CD) 240 MG 24 hr capsule Take 1 capsule (240 mg total) by mouth daily. 90 capsule 3  . Fluticasone Furoate-Vilanterol 100-25 MCG/INH AEPB Inhale 1 puff into the lungs daily.    . furosemide (LASIX) 40 MG tablet Take 1.5 tabs in AM and 1 tab in PM 75 tablet 6  . ipratropium-albuterol (DUONEB) 0.5-2.5 (3)  MG/3ML SOLN Take 3 mLs by nebulization every 6 (six) hours as needed.    . Multiple Vitamins-Minerals (MULTIVITAMIN WITH MINERALS) tablet Take 1 tablet by mouth daily.    . potassium chloride SA (K-DUR,KLOR-CON) 20 MEQ tablet TAKE ONE TABLET BY MOUTH ONE TIME DAILY 30 tablet 6  . sertraline (ZOLOFT) 50 MG tablet Take 50 mg by mouth daily.    Marland Kitchen tiotropium (SPIRIVA) 18 MCG inhalation capsule Place 18 mcg into inhaler and inhale daily.    [name] 20 MG TABS tablet TAKE 1 TABLET BY MOUTH DAILY WITH SUPPER. 30 tablet 6   No current  facility-administered medications for this encounter.    Filed Vitals:   08/05/15 0939  BP: 138/74  Pulse: 74  Weight: 210 lb 8 oz (95.482 kg)  SpO2: 96%   Physical Exam: General: NAD Neck: JVP 7 cm, no thyromegaly or thyroid nodule.  Lungs: decreased throughout. Clear CV: Chest scar looks good. Regular S1S2. 1/6 early SEM at RUSB. No carotid bruit.  Normal pedal pulses.  Abdomen: obese. soft, nontender, no hepatosplenomegaly, no distention.  Skin: Intact without lesions or rashes.  Neurologic: Alert and oriented x 3.  Psych: Normal affect. Extremities: No clubbing or cyanosis. No edema.   HEENT: Normal.   Assessment/Plan:  1. Valvular heart disease - Severe aortic stenosis and moderate MR, s/p AVR (tissue) and MV ring with Maze procedure 03/15/13.  - Aortic and mitral valves appeared stable on 2/17 echo.  2. Atrial fibrillation: s/p MAZE. She was back in atrial fibrillation with increased CHF symptoms at a prior appointment.  She is now on amiodarone and is in a regular junctional rhythm today.  She is noted to have developed hyperthyroidism in the setting of amiodarone use.  - Continue anticoagulation with Xarelto.  We have discussed the lack of data regarding use of Xarelto with bioprosthetic valves.  She understands this but had a very hard time regulating INR with warfarin in the past and strongly wants to stay with DOAC.  I will not change.  - Rhythm remains a junctional rhythm. As she has not reverted to NSR and has developed hyperthyroidism, I am going to stop amiodarone.   - Given prior MAZE, she is probably not a good atrial fibrillation ablation candidate.  3. Chronic diastolic CHF: NYHA II-III symptoms, improved. She is not volume overloaded on exam.  Current exertional dyspnea is likely more due to COPD.  - Continue Lasix 60 qam, 40 qpm.   4. COPD: stable. She no longer is smoking.  5. Hyperthyroidism: Suspect this is related to amiodarone.  Stopping amiodarone and will  refer her to endocrinology.   [name] 08/05/2015

## 2015-08-08 ENCOUNTER — Other Ambulatory Visit (HOSPITAL_COMMUNITY): Payer: Self-pay | Admitting: Cardiology

## 2015-08-15 ENCOUNTER — Encounter (HOSPITAL_COMMUNITY): Payer: Medicare Other

## 2015-09-01 ENCOUNTER — Encounter: Payer: Self-pay | Admitting: "Endocrinology

## 2015-09-01 ENCOUNTER — Ambulatory Visit (INDEPENDENT_AMBULATORY_CARE_PROVIDER_SITE_OTHER): Payer: Medicare Other | Admitting: "Endocrinology

## 2015-09-01 VITALS — BP 119/70 | HR 72 | Ht 63.0 in | Wt 214.0 lb

## 2015-09-01 DIAGNOSIS — E0590 Thyrotoxicosis, unspecified without thyrotoxic crisis or storm: Secondary | ICD-10-CM | POA: Insufficient documentation

## 2015-09-01 LAB — T3, FREE: T3, Free: 2.8 pg/mL (ref 2.3–4.2)

## 2015-09-01 LAB — T4, FREE: FREE T4: 1.6 ng/dL (ref 0.8–1.8)

## 2015-09-01 LAB — TSH: TSH: 0.3 m[IU]/L — AB

## 2015-09-01 NOTE — Progress Notes (Signed)
Subjective:    Patient ID: Nicole [name], female    DOB: 05-01-1936, PCP Lavonia Dana, MD   Past Medical History  Diagnosis Date  . SOB (shortness of breath)     conplicated by underlying a fib with RVR,COPD, and CHF  . Atrial fibrillation ([place])     with Rapid Ventricular response  . Anxiety   . Hyperlipidemia   . COPD (chronic obstructive pulmonary disease) ([place])   . Diastolic CHF [facility])    Past Surgical History  Procedure Laterality Date  . Abdominal hysterectomy    . Cataract extraction    . Tonsillectomy    . Rotator cuff repair    . Breast biopsy      x3  . Cardioversion N/A 02/13/2013    Procedure: CARDIOVERSION;  Surgeon: [name], MD;  Location: [facility] ENDOSCOPY;  Service: Cardiovascular;  Laterality: N/A;  . Tee without cardioversion N/A 03/01/2013    Procedure: TRANSESOPHAGEAL ECHOCARDIOGRAM (TEE);  Surgeon: [name], MD;  Location: [place];  Service: Cardiovascular;  Laterality: N/A;  talked to bev. booking no. is [id number] called trish to verify time  . Cardioversion N/A 03/01/2013    Procedure: CARDIOVERSION;  Surgeon: [name], MD;  Location: [place];  Service: Cardiovascular;  Laterality: N/A;  . Aortic valve replacement N/A 03/05/2013    Procedure: AORTIC VALVE REPLACEMENT (AVR);  Surgeon: Gaye Pollack, MD;  Location: [place];  Service: Open Heart Surgery;  Laterality: N/A;  . Maze N/A 03/05/2013    Procedure: MAZE;  Surgeon: Gaye Pollack, MD;  Location: [place];  Service: Open Heart Surgery;  Laterality: N/A;  . Intraoperative transesophageal echocardiogram N/A 03/05/2013    Procedure: INTRAOPERATIVE TRANSESOPHAGEAL ECHOCARDIOGRAM;  Surgeon: Gaye Pollack, MD;  Location[facility] OR;  Service: Open Heart Surgery;  Laterality: N/A;  . Mitral valve repair N/A 03/05/2013    Procedure: MITRAL VALVE REPAIR (MVR);  Surgeon: Gaye Pollack, MD;  Location: [place];  Service: Open Heart Surgery;  Laterality: N/A;  . Left and right heart catheterization  with coronary angiogram N/A 03/02/2013    Procedure: LEFT AND RIGHT HEART CATHETERIZATION WITH CORONARY ANGIOGRAM;  Surgeon: [name], MD;  Location: [facility] CATH LAB;  Service: Cardiovascular;  Laterality: N/A;   Social History   Social History  . Marital Status: Single    Spouse Name: N/A  . Number of Children: N/A  . Years of Education: N/A   Social History Main Topics  . Smoking status: Former Smoker -- 1.00 packs/day for 59 years    Types: Cigarettes    Start date: 06/12/1955    Quit date: 11/28/2012  . Smokeless tobacco: Never Used     Comment: Quit in July 2014  . Alcohol Use: No  . Drug Use: No  . Sexual Activity: No   Other Topics Concern  . None   Social History Narrative   Outpatient Encounter Prescriptions as of 09/01/2015  Medication Sig  . ALPRAZolam (XANAX) 0.25 MG tablet Take 1 tablet by mouth 2 (two) times daily as needed. Reported on 06/17/2015  . atorvastatin (LIPITOR) 40 MG tablet Take 40 mg by mouth every evening.   . diltiazem (CARDIZEM CD) 240 MG 24 hr capsule Take 1 capsule (240 mg total) by mouth daily.  . Fluticasone Furoate-Vilanterol 100-25 MCG/INH AEPB Inhale 1 puff into the lungs daily.  . furosemide (LASIX) 40 MG tablet Take 1.5 tabs in AM and 1 tab in PM  . ipratropium-albuterol (DUONEB)  0.5-2.5 (3) MG/3ML SOLN Take 3 mLs by nebulization every 6 (six) hours as needed.  . Multiple Vitamins-Minerals (MULTIVITAMIN WITH MINERALS) tablet Take 1 tablet by mouth daily.  . potassium chloride SA (K-DUR,KLOR-CON) 20 MEQ tablet TAKE ONE TABLET BY MOUTH ONE TIME DAILY  . sertraline (ZOLOFT) 50 MG tablet Take 50 mg by mouth daily.  Marland Kitchen tiotropium (SPIRIVA) 18 MCG inhalation capsule Place 18 mcg into inhaler and inhale daily.  [name] 20 MG TABS tablet TAKE 1 TABLET BY MOUTH DAILY WITH SUPPER.   No facility-administered encounter medications on file as of 09/01/2015.   ALLERGIES: No Known Allergies VACCINATION STATUS:  There is no immunization history on  file for this patient.  HPI  80 year old female patient with medical history as above. She is being seen in consultation for abnormal thyroid function test requested by Lavonia Dana, MD.  She denies any prior history of thyroid dysfunction. On her thyroid function test on 07/18/2015 she was found to have suppressed TSH of 0.063. She denies any weight loss, palpitations, sleep disturbance, heat intolerance, nor tremors. She reports that she cannot lose weight, feels fatigued, she has cold intolerance. She has a family history of thyroid problem in her daughter. She denies history of goiter nor any neck surgery. She is a chronic former smoker with 52 years of smoking. She has coronary artery disease status post coronary artery bypass graft.  Review of Systems Constitutional: + weight gain, + fatigue, + subjective hypothermia Eyes: no blurry vision, no xerophthalmia ENT: no sore throat, no nodules palpated in throat, no dysphagia/odynophagia, no hoarseness Cardiovascular: no CP/SOB/palpitations/leg swelling Respiratory: +cough, +SOB Gastrointestinal: no N/V/D/C Musculoskeletal: no muscle/joint aches Skin: no rashes Neurological: no tremors/numbness/tingling/dizziness Psychiatric: no depression/anxiety  Objective:    BP 119/70 mmHg  Pulse 72  Ht 5\' 3"  (1.6 m)  Wt 214 lb (97.07 kg)  BMI 37.92 kg/m2  SpO2 96%  Wt Readings from Last 3 Encounters:  09/01/15 214 lb (97.07 kg)  08/05/15 210 lb 8 oz (95.482 kg)  07/18/15 209 lb 12 oz (95.142 kg)    Physical Exam Constitutional: overweight, in NAD Eyes: PERRLA, EOMI, no exophthalmos ENT: moist mucous membranes, no thyromegaly, no cervical lymphadenopathy Cardiovascular: RRR, No MRG Respiratory: + scattered wheezes Gastrointestinal: abdomen soft, NT, ND, BS+ Musculoskeletal: no deformities, strength intact in all 4 Skin: moist, warm, no rashes Neurological: no tremor with outstretched hands, DTR normal in all 4   CMP      Component Value Date/Time   NA 141 07/18/2015 1200   K 3.5 07/18/2015 1200   CL 105 07/18/2015 1200   CO2 26 07/18/2015 1200   GLUCOSE 128* 07/18/2015 1200   BUN 17 07/18/2015 1200   CREATININE 1.04* 07/18/2015 1200   CREATININE 1.04 02/09/2013 1630   CALCIUM 9.1 07/18/2015 1200   PROT 6.9 07/18/2015 1200   ALBUMIN 4.1 07/18/2015 1200   AST 27 07/18/2015 1200   ALT 43 07/18/2015 1200   ALKPHOS 71 07/18/2015 1200   BILITOT 0.9 07/18/2015 1200   GFRNONAA 50* 07/18/2015 1200   GFRAA 58* 07/18/2015 1200      Lab Results  Component Value Date   HGBA1C 5.4 03/04/2013     Results for NETHRA, FRANCIONE (MRN [id number]) as of 09/01/2015 19:01  Ref. Range 07/18/2015 12:00  TSH Latest Ref Range: 0.350-4.500 uIU/mL 0.063 (L)     Assessment & Plan:   1. Subclinical hyperthyroidism - I reviewed her available thyroid workup and evaluation the patient clinically.  Her symptom  complexes suggest hypothyroidism as opposed to hyperthyroidism, not clear yet if she has secondary hypothyroidism. Based on findings she has subclinical hyperthyroidism which would not need immediate intervention however she will need to have full profile thyroid function test including: Free T4, TSH, free T3, TPO antibodies, and thyroglobulin antibodies. She will return in 1 week to review this results with me and treatment decision if necessary. Physical exam is negative for goiter hence no need for imaging studies.   -I have given her detailed dietary advise to help her lose weight.  - I advised patient to maintain close follow up with Lavonia Dana, MD for primary care needs. Follow up plan: Return in about 1 week (around 09/08/2015) for labs today.  Glade Lloyd, MD Phone: [phone]  Fax: [phone]   09/01/2015, 6:59 PM

## 2015-09-02 LAB — THYROID PEROXIDASE ANTIBODY: THYROID PEROXIDASE ANTIBODY: 1 [IU]/mL (ref <9)

## 2015-09-02 LAB — THYROGLOBULIN ANTIBODY

## 2015-09-03 DIAGNOSIS — L570 Actinic keratosis: Secondary | ICD-10-CM | POA: Diagnosis not present

## 2015-09-09 ENCOUNTER — Encounter: Payer: Self-pay | Admitting: "Endocrinology

## 2015-09-09 ENCOUNTER — Ambulatory Visit (INDEPENDENT_AMBULATORY_CARE_PROVIDER_SITE_OTHER): Payer: Medicare Other | Admitting: "Endocrinology

## 2015-09-09 DIAGNOSIS — E0590 Thyrotoxicosis, unspecified without thyrotoxic crisis or storm: Secondary | ICD-10-CM | POA: Diagnosis not present

## 2015-09-09 MED ORDER — METHIMAZOLE 5 MG PO TABS: 5.0000 mg | ORAL_TABLET | Freq: Three times a day (TID) | ORAL | Status: DC

## 2015-09-09 NOTE — Progress Notes (Signed)
Subjective:    Patient ID: Nicole [name], female    DOB: [birth year], PCP Lavonia Dana, MD   Past Medical History  Diagnosis Date  . SOB (shortness of breath)     conplicated by underlying a fib with RVR,COPD, and CHF  . Atrial fibrillation ([place])     with Rapid Ventricular response  . Anxiety   . Hyperlipidemia   . COPD (chronic obstructive pulmonary disease) ([place])   . Diastolic CHF [facility])    Past Surgical History  Procedure Laterality Date  . Abdominal hysterectomy    . Cataract extraction    . Tonsillectomy    . Rotator cuff repair    . Breast biopsy      x3  . Cardioversion N/A 02/13/2013    Procedure: CARDIOVERSION;  Surgeon: [name], MD;  Location: [facility] ENDOSCOPY;  Service: Cardiovascular;  Laterality: N/A;  . Tee without cardioversion N/A 03/01/2013    Procedure: TRANSESOPHAGEAL ECHOCARDIOGRAM (TEE);  Surgeon: [name], MD;  Location: [place];  Service: Cardiovascular;  Laterality: N/A;  talked to bev. booking no. is [id number] called trish to verify time  . Cardioversion N/A 03/01/2013    Procedure: CARDIOVERSION;  Surgeon: [name], MD;  Location: [place];  Service: Cardiovascular;  Laterality: N/A;  . Aortic valve replacement N/A 03/05/2013    Procedure: AORTIC VALVE REPLACEMENT (AVR);  Surgeon: Gaye Pollack, MD;  Location: [place];  Service: Open Heart Surgery;  Laterality: N/A;  . Maze N/A 03/05/2013    Procedure: MAZE;  Surgeon: Gaye Pollack, MD;  Location: [place];  Service: Open Heart Surgery;  Laterality: N/A;  . Intraoperative transesophageal echocardiogram N/A 03/05/2013    Procedure: INTRAOPERATIVE TRANSESOPHAGEAL ECHOCARDIOGRAM;  Surgeon: Gaye Pollack, MD;  Location: [facility] OR;  Service: Open Heart Surgery;  Laterality: N/A;  . Mitral valve repair N/A 03/05/2013    Procedure: MITRAL VALVE REPAIR (MVR);  Surgeon: Gaye Pollack, MD;  Location: [place];  Service: Open Heart Surgery;  Laterality: N/A;  . Left and right heart catheterization  with coronary angiogram N/A 03/02/2013    Procedure: LEFT AND RIGHT HEART CATHETERIZATION WITH CORONARY ANGIOGRAM;  Surgeon: [name], MD;  Location: [facility] CATH LAB;  Service: Cardiovascular;  Laterality: N/A;   Social History   Social History  . Marital Status: Single    Spouse Name: N/A  . Number of Children: N/A  . Years of Education: N/A   Social History Main Topics  . Smoking status: Former Smoker -- 1.00 packs/day for 43 years    Types: Cigarettes    Start date: 06/12/1955    Quit date: 11/28/2012  . Smokeless tobacco: Never Used     Comment: Quit in July 2014  . Alcohol Use: No  . Drug Use: No  . Sexual Activity: No   Other Topics Concern  . None   Social History Narrative   Outpatient Encounter Prescriptions as of 09/09/2015  Medication Sig  . ALPRAZolam (XANAX) 0.25 MG tablet Take 1 tablet by mouth 2 (two) times daily as needed. Reported on 06/17/2015  . atorvastatin (LIPITOR) 40 MG tablet Take 40 mg by mouth every evening.   . diltiazem (CARDIZEM CD) 240 MG 24 hr capsule Take 1 capsule (240 mg total) by mouth daily.  . Fluticasone Furoate-Vilanterol 100-25 MCG/INH AEPB Inhale 1 puff into the lungs daily.  . furosemide (LASIX) 40 MG tablet Take 1.5 tabs in AM and 1 tab in PM  . ipratropium-albuterol (DUONEB)  0.5-2.5 (3) MG/3ML SOLN Take 3 mLs by nebulization every 6 (six) hours as needed.  . methimazole (TAPAZOLE) 5 MG tablet Take 1 tablet (5 mg total) by mouth 3 (three) times daily.  . Multiple Vitamins-Minerals (MULTIVITAMIN WITH MINERALS) tablet Take 1 tablet by mouth daily.  . potassium chloride SA (K-DUR,KLOR-CON) 20 MEQ tablet TAKE ONE TABLET BY MOUTH ONE TIME DAILY  . sertraline (ZOLOFT) 50 MG tablet Take 50 mg by mouth daily.  Marland Kitchen tiotropium (SPIRIVA) 18 MCG inhalation capsule Place 18 mcg into inhaler and inhale daily.  [name] 20 MG TABS tablet TAKE 1 TABLET BY MOUTH DAILY WITH SUPPER.   No facility-administered encounter medications on file as of  09/09/2015.   ALLERGIES: No Known Allergies VACCINATION STATUS:  There is no immunization history on file for this patient.  HPI  80 year old female patient with medical history as above. She is  here to follow-up after being seen in  in consultation for abnormal thyroid function test requested by Lavonia Dana, MD. - Her repeat thyroid function test is still consistent with subclinical hyperthyroidism, with TSH 0.3 slightly suppressed, and free T4 high normal at 1.6. She denies any prior history of thyroid dysfunction. On her thyroid function test on 07/18/2015 she was found to have suppressed TSH of 0.063.  She denies new complaints today.  She has a family history of thyroid problem in her daughter. She denies history of goiter nor any neck surgery. She is a chronic former smoker with 52 years of smoking. She has coronary artery disease status post coronary artery bypass graft.  Review of Systems Constitutional: + weight gain, + fatigue, + subjective hypothermia Eyes: no blurry vision, no xerophthalmia ENT: no sore throat, no nodules palpated in throat, no dysphagia/odynophagia, no hoarseness Cardiovascular: no CP/SOB/palpitations/leg swelling Respiratory: +cough, +SOB Gastrointestinal: no N/V/D/C Musculoskeletal: no muscle/joint aches Skin: no rashes Neurological: no tremors/numbness/tingling/dizziness Psychiatric: no depression/anxiety  Objective:    BP 119/78 mmHg  Pulse 94  Ht 5\' 3"  (1.6 m)  Wt 215 lb (97.523 kg)  BMI 38.09 kg/m2  SpO2 94%  Wt Readings from Last 3 Encounters:  09/09/15 215 lb (97.523 kg)  09/01/15 214 lb (97.07 kg)  08/05/15 210 lb 8 oz (95.482 kg)    Physical Exam Constitutional: overweight, in NAD Eyes: PERRLA, EOMI, no exophthalmos ENT: moist mucous membranes, no thyromegaly, no cervical lymphadenopathy Cardiovascular: RRR, No MRG Respiratory: + scattered wheezes Gastrointestinal: abdomen soft, NT, ND, BS+ Musculoskeletal: no deformities,  strength intact in all 4 Skin: moist, warm, no rashes Neurological: no tremor with outstretched hands, DTR normal in all 4   CMP     Component Value Date/Time   NA 141 07/18/2015 1200   K 3.5 07/18/2015 1200   CL 105 07/18/2015 1200   CO2 26 07/18/2015 1200   GLUCOSE 128* 07/18/2015 1200   BUN 17 07/18/2015 1200   CREATININE 1.04* 07/18/2015 1200   CREATININE 1.04 02/09/2013 1630   CALCIUM 9.1 07/18/2015 1200   PROT 6.9 07/18/2015 1200   ALBUMIN 4.1 07/18/2015 1200   AST 27 07/18/2015 1200   ALT 43 07/18/2015 1200   ALKPHOS 71 07/18/2015 1200   BILITOT 0.9 07/18/2015 1200   GFRNONAA 50* 07/18/2015 1200   GFRAA 58* 07/18/2015 1200    Lab Results  Component Value Date   HGBA1C 5.4 03/04/2013     Results for Nicole [name], Nicole [name] (MRN [id number]) as of 09/09/2015 10:44  Ref. Range 09/01/2015 10:41  TSH Latest Units: mIU/L 0.30 (L)  T4,Free(Direct)  Latest Ref Range: 0.8-1.8 ng/dL 1.6  Triiodothyronine,Free,Serum Latest Ref Range: 2.3-4.2 pg/mL 2.8  Thyroglobulin Ab Latest Ref Range: <2 IU/mL <1  Thyroperoxidase Ab SerPl-aCnc Latest Ref Range: <9 IU/mL 1    Assessment & Plan:   1. Subclinical hyperthyroidism - Based on findings she has subclinical hyperthyroidism. -However, given her history of atrial fibrillation and some other subtle body symptoms, she would benefit from low-dose antithyroid medication barring RAI thyroid ablation. I discussed this  with her and she agreed with my plan to initiate Tapazole 5 mg by mouth every morning. Side effects and precautions discussed in detail.  She will have repeat thyroid function test in 3 months time. Physical exam is negative for goiter hence no need for imaging studies. -I have given her detailed dietary advise to help her lose weight.  - I advised patient to maintain close follow up with Lavonia Dana, MD for primary care needs. Follow up plan: No Follow-up on file.  Glade Lloyd, MD Phone: [phone]  Fax: [phone]    09/09/2015, 11:15 AM

## 2015-09-19 DIAGNOSIS — I509 Heart failure, unspecified: Secondary | ICD-10-CM | POA: Diagnosis not present

## 2015-09-19 DIAGNOSIS — E669 Obesity, unspecified: Secondary | ICD-10-CM | POA: Diagnosis not present

## 2015-09-19 DIAGNOSIS — I4891 Unspecified atrial fibrillation: Secondary | ICD-10-CM | POA: Diagnosis not present

## 2015-09-19 DIAGNOSIS — J449 Chronic obstructive pulmonary disease, unspecified: Secondary | ICD-10-CM | POA: Diagnosis not present

## 2015-10-31 ENCOUNTER — Other Ambulatory Visit: Payer: Self-pay | Admitting: *Deleted

## 2015-10-31 MED ORDER — DILTIAZEM HCL ER COATED BEADS 240 MG PO CP24: 240.0000 mg | ORAL_CAPSULE | Freq: Every day | ORAL | Status: DC

## 2015-11-06 ENCOUNTER — Encounter (HOSPITAL_COMMUNITY): Payer: Self-pay

## 2015-11-06 ENCOUNTER — Ambulatory Visit (HOSPITAL_COMMUNITY)
Admission: RE | Admit: 2015-11-06 | Discharge: 2015-11-06 | Disposition: A | Discharge status: Home / Self Care | Payer: Medicare Other | Source: Ambulatory Visit | Attending: Internal Medicine | Admitting: Internal Medicine

## 2015-11-06 VITALS — BP 130/82 | HR 67 | Wt 215.2 lb

## 2015-11-06 DIAGNOSIS — Z79899 Other long term (current) drug therapy: Secondary | ICD-10-CM | POA: Diagnosis not present

## 2015-11-06 DIAGNOSIS — I5032 Chronic diastolic (congestive) heart failure: Secondary | ICD-10-CM | POA: Insufficient documentation

## 2015-11-06 DIAGNOSIS — I4891 Unspecified atrial fibrillation: Secondary | ICD-10-CM | POA: Insufficient documentation

## 2015-11-06 DIAGNOSIS — Z87891 Personal history of nicotine dependence: Secondary | ICD-10-CM | POA: Insufficient documentation

## 2015-11-06 DIAGNOSIS — Z953 Presence of xenogenic heart valve: Secondary | ICD-10-CM | POA: Diagnosis not present

## 2015-11-06 DIAGNOSIS — E785 Hyperlipidemia, unspecified: Secondary | ICD-10-CM | POA: Diagnosis not present

## 2015-11-06 DIAGNOSIS — E0590 Thyrotoxicosis, unspecified without thyrotoxic crisis or storm: Secondary | ICD-10-CM | POA: Diagnosis not present

## 2015-11-06 DIAGNOSIS — Z7901 Long term (current) use of anticoagulants: Secondary | ICD-10-CM | POA: Insufficient documentation

## 2015-11-06 DIAGNOSIS — I480 Paroxysmal atrial fibrillation: Secondary | ICD-10-CM

## 2015-11-06 DIAGNOSIS — J449 Chronic obstructive pulmonary disease, unspecified: Secondary | ICD-10-CM | POA: Insufficient documentation

## 2015-11-06 LAB — LIPID PANEL
CHOLESTEROL: 120 mg/dL (ref 0–200)
HDL: 54 mg/dL (ref >40)
LDL Cholesterol: 54 mg/dL (ref 0–99)
TRIGLYCERIDES: 62 mg/dL (ref <150)
Total CHOL/HDL Ratio: 2.2 RATIO
VLDL: 12 mg/dL (ref 0–40)

## 2015-11-06 LAB — BASIC METABOLIC PANEL
ANION GAP: 7 (ref 5–15)
BUN: 18 mg/dL (ref 6–20)
CHLORIDE: 107 mmol/L (ref 101–111)
CO2: 27 mmol/L (ref 22–32)
Calcium: 9.3 mg/dL (ref 8.9–10.3)
Creatinine, Ser: 1.04 mg/dL — ABNORMAL HIGH (ref 0.44–1.00)
GFR calc Af Amer: 58 mL/min — ABNORMAL LOW (ref >60)
GFR, EST NON AFRICAN AMERICAN: 50 mL/min — AB (ref >60)
GLUCOSE: 114 mg/dL — AB (ref 65–99)
POTASSIUM: 3.7 mmol/L (ref 3.5–5.1)
Sodium: 141 mmol/L (ref 135–145)

## 2015-11-06 LAB — T4, FREE: FREE T4: 0.79 ng/dL (ref 0.61–1.12)

## 2015-11-06 LAB — TSH: TSH: 0.406 u[IU]/mL (ref 0.350–4.500)

## 2015-11-06 NOTE — Patient Instructions (Signed)
Routine lab work today. Will notify you of abnormal results  Follow up with Dr.McLean in 4 months 

## 2015-11-06 NOTE — Progress Notes (Signed)
Patient ID: Nicole [name], female   DOB: 11-Jul-1935, 80 y.o.   MRN: [id number] PCP: Dr. Philipp Deputy [facility]) Cardiology: Dr. Jacinta Shoe HF Cardiology: Dr. Aundra Dubin  Nicole [name] is a 80 yo with history of COPD (93 pack years quit April 2014), atrial fibrillation s/p AVR with pericardial valve, mitral annuloplasty, MAZE procedure and clipping of the LA appendage (99991111) and diastolic CHF. Daughter is Nicole [name] (CCU nurse).   It appears that she went back into atrial fibrillation around 5/16.  Given increased symptoms when in atrial fibrillation, I started her on amiodarone and planned for DCCV after amiodarone load. She went out of atrial fibrillation and into a stable junctional rhythm with rate in the 60s.  She remains in this junctional rhythm today.  She also developed subclinical hyperthyroidism.  I stopped her amiodarone at last appointment.   She is doing well currently.  Weight is up a few pounds on our scale.  No orthopnea/PND.  She is short of breath walking 100-200 feet, this is her chronic pattern likely due to COPD.  No chest pain.  She saw an endocrinologist who recommended that she take methimazole.  However, she never started it.  No BRBPR or melena on Xarelto.   ECG: Junctional rhythm at 58, inferior Qs  Labs (9/14): K 4, creatinine 0.9 => 1.04, HCT 33.7, TnI 0.21 (while hospitalized in [place]), BNP 158, TSH normal, AST normal, ALT 39 Labs (03/16/13):  K 3.9 Cr 1.0 Labs (6/16): K 4.3, creatinine 0.99, Hgb 14.6 Labs (1/17): K 4.3, creatinine 1.08 Labs (2/17): K 3.5, creatinine 1.04, BNP 220, free T3 normal, free T4 high, TSH 0.09, LFTs normal  PMH: 1. COPD: PFTs (5/15) with FEV1 61%, RVC 63%, ratio 96%, DLCO 50%.   2. Atrial fibrillation: First noted in 7/14.  DCCV in 8/14 to NSR.  Recurred by 9/14.  DCCV to NSR 9/14 but recurred. S/p Maze procedure 10/14.  Recurrent atrial fibrillation around 5/16, later developed a junctional rhythm on amiodarone.  3. Hyperlipidemia 4. Diastolic CHF:  Echo (123456) with EF 55-60%, grade II diastolic dysfunction, mild LVH, moderate to severe AS with mean gradient 33 mmHg/peak 49 mmHg and AVA 0.74 cm^2.  Echo (2/17) with EF 60-65%, mild focal basal septal hypertrophy, bioprosthetic aortic valve appeared normal, s/p mitral valve repair with no significant mitral stenosis.  5. Severe AS/Moderate MR. 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 Normal coronaries cath 10/14 7. Ventral hernia.  8. Low back pain: Lumbar disc disease.  9. Hyperthyroidism: Subclinical.  Likely related to amiodarone use.  10. Junctional rhythm.   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  Lives alone.  Daughter come to appointments.   FH: No premature CAD.  Breast cancer.   ROS: All systems reviewed and negative except as per HPI.   Current Outpatient Prescriptions  Medication Sig Dispense Refill  . atorvastatin (LIPITOR) 40 MG tablet Take 40 mg by mouth every evening.     . diltiazem (CARDIZEM CD) 240 MG 24 hr capsule Take 1 capsule (240 mg total) by mouth daily. 90 capsule 0  . Fluticasone Furoate-Vilanterol 100-25 MCG/INH AEPB Inhale 1 puff into the lungs daily.    . furosemide (LASIX) 40 MG tablet Take 1.5 tabs in AM and 1 tab in PM 75 tablet 6  . ipratropium-albuterol (DUONEB) 0.5-2.5 (3) MG/3ML SOLN Take 3 mLs by nebulization every 6 (six) hours as needed.    . Multiple  Vitamins-Minerals (MULTIVITAMIN WITH MINERALS) tablet Take 1 tablet by mouth daily.    . potassium chloride SA (K-DUR,KLOR-CON) 20 MEQ tablet TAKE ONE TABLET BY MOUTH ONE TIME DAILY 30 tablet 6  . sertraline (ZOLOFT) 50 MG tablet Take 50 mg by mouth daily.    [name] 20 MG TABS tablet TAKE 1 TABLET BY MOUTH DAILY WITH SUPPER. 30 tablet 6  . ALPRAZolam (XANAX) 0.25 MG tablet Take 1 tablet by mouth 2 (two) times daily as needed. Reported on 11/06/2015     No current facility-administered medications for this  encounter.    Filed Vitals:   11/06/15 0959  BP: 130/82  Pulse: 67  Weight: 215 lb 4 oz (97.637 kg)  SpO2: 99%   Physical Exam: General: NAD Neck: JVP 7 cm, no thyromegaly or thyroid nodule.  Lungs: Decreased throughout. Clear CV: Regular S1S2. 1/6 early SEM at RUSB. No carotid bruit.  No edema.  Bilateral lower leg varicosities.  Abdomen: obese. soft, nontender, no hepatosplenomegaly, no distention.  Skin: Intact without lesions or rashes.  Neurologic: Alert and oriented x 3.  Psych: Normal affect. Extremities: No clubbing or cyanosis. No edema.   HEENT: Normal.   Assessment/Plan:  1. Valvular heart disease: Severe aortic stenosis and moderate MR, s/p AVR (tissue) and MV ring with Maze procedure 03/15/13.  - Aortic and mitral valves appeared stable on 2/17 echo.  2. Atrial fibrillation: s/p MAZE. She was back in atrial fibrillation with increased CHF symptoms at a prior appointment.  She went into a regular junctional rhythm on amiodarone.  She also developed hyperthyroidism in the setting of amiodarone use.  - Continue anticoagulation with Xarelto.  She has refused warfarin, and most recent data suggests that DOACs should be ok with bioprosthetic valves.  CBC today.  - Rhythm remains a junctional rhythm. As she has not reverted to NSR and developed hyperthyroidism, I stopped amiodarone at a prior appointment. - Given prior MAZE, she is probably not a good atrial fibrillation ablation candidate.  3. Chronic diastolic CHF: NYHA II-III symptoms, improved. She is not volume overloaded on exam.  Current exertional dyspnea is likely more due to COPD.  - Continue Lasix 60 qam, 40 qpm.  BMET/BNP today.  4. COPD: stable. She no longer is smoking.  5. Hyperthyroidism: At this point, appears to have subclinical hyperthyroidism based on most recent thyroid indices (normal free T3 and free T4, low TSH).  She saw endocrinology and was recommended to start methimazole.  She decided against this.  I will resend thyroid indices today.  If overtly hyperthyroid, she needs to start methimazole.    Followup in 4 months.   [name] 11/06/2015

## 2015-11-07 LAB — T3, FREE: T3 FREE: 3 pg/mL (ref 2.0–4.4)

## 2015-12-12 ENCOUNTER — Ambulatory Visit: Payer: Medicare Other | Admitting: "Endocrinology

## 2015-12-16 DIAGNOSIS — R42 Dizziness and giddiness: Secondary | ICD-10-CM | POA: Diagnosis not present

## 2016-01-08 ENCOUNTER — Other Ambulatory Visit (HOSPITAL_COMMUNITY): Payer: Self-pay | Admitting: *Deleted

## 2016-01-08 MED ORDER — FUROSEMIDE 40 MG PO TABS: ORAL_TABLET | ORAL | 6 refills | Status: DC

## 2016-01-13 DIAGNOSIS — M5442 Lumbago with sciatica, left side: Secondary | ICD-10-CM | POA: Diagnosis not present

## 2016-01-13 DIAGNOSIS — M5136 Other intervertebral disc degeneration, lumbar region: Secondary | ICD-10-CM | POA: Diagnosis not present

## 2016-01-13 DIAGNOSIS — M25552 Pain in left hip: Secondary | ICD-10-CM | POA: Diagnosis not present

## 2016-01-13 DIAGNOSIS — G8929 Other chronic pain: Secondary | ICD-10-CM | POA: Diagnosis not present

## 2016-01-23 DIAGNOSIS — M5442 Lumbago with sciatica, left side: Secondary | ICD-10-CM | POA: Diagnosis not present

## 2016-01-27 DIAGNOSIS — M5442 Lumbago with sciatica, left side: Secondary | ICD-10-CM | POA: Diagnosis not present

## 2016-01-27 DIAGNOSIS — M5136 Other intervertebral disc degeneration, lumbar region: Secondary | ICD-10-CM | POA: Diagnosis not present

## 2016-01-27 DIAGNOSIS — G8929 Other chronic pain: Secondary | ICD-10-CM | POA: Diagnosis not present

## 2016-01-27 DIAGNOSIS — M5416 Radiculopathy, lumbar region: Secondary | ICD-10-CM | POA: Diagnosis not present

## 2016-01-31 ENCOUNTER — Other Ambulatory Visit: Payer: Self-pay | Admitting: Cardiovascular Disease

## 2016-03-08 ENCOUNTER — Other Ambulatory Visit: Payer: Self-pay | Admitting: Cardiovascular Disease

## 2016-03-10 ENCOUNTER — Other Ambulatory Visit: Payer: Self-pay | Admitting: Cardiology

## 2016-03-10 ENCOUNTER — Other Ambulatory Visit: Payer: Self-pay | Admitting: *Deleted

## 2016-03-10 MED ORDER — DILTIAZEM HCL ER COATED BEADS 240 MG PO CP24: 240.0000 mg | ORAL_CAPSULE | Freq: Every day | ORAL | 0 refills | Status: DC

## 2016-03-10 MED ORDER — RIVAROXABAN 20 MG PO TABS: 20.0000 mg | ORAL_TABLET | Freq: Every day | ORAL | 0 refills | Status: DC

## 2016-03-29 ENCOUNTER — Other Ambulatory Visit: Payer: Self-pay | Admitting: *Deleted

## 2016-03-29 ENCOUNTER — Other Ambulatory Visit (HOSPITAL_COMMUNITY): Payer: Self-pay | Admitting: *Deleted

## 2016-03-29 MED ORDER — POTASSIUM CHLORIDE CRYS ER 20 MEQ PO TBCR: 20.0000 meq | EXTENDED_RELEASE_TABLET | Freq: Every day | ORAL | 3 refills | Status: DC

## 2016-03-29 MED ORDER — DILTIAZEM HCL ER COATED BEADS 240 MG PO CP24: 240.0000 mg | ORAL_CAPSULE | Freq: Every day | ORAL | 0 refills | Status: DC

## 2016-04-06 ENCOUNTER — Other Ambulatory Visit (HOSPITAL_COMMUNITY): Payer: Self-pay | Admitting: *Deleted

## 2016-04-06 DIAGNOSIS — Z961 Presence of intraocular lens: Secondary | ICD-10-CM | POA: Diagnosis not present

## 2016-04-06 DIAGNOSIS — H35372 Puckering of macula, left eye: Secondary | ICD-10-CM | POA: Diagnosis not present

## 2016-04-06 DIAGNOSIS — H26491 Other secondary cataract, right eye: Secondary | ICD-10-CM | POA: Diagnosis not present

## 2016-04-06 MED ORDER — DILTIAZEM HCL ER COATED BEADS 240 MG PO CP24: 240.0000 mg | ORAL_CAPSULE | Freq: Every day | ORAL | 3 refills | Status: DC

## 2016-04-19 DIAGNOSIS — M5416 Radiculopathy, lumbar region: Secondary | ICD-10-CM | POA: Diagnosis not present

## 2016-04-19 DIAGNOSIS — G8929 Other chronic pain: Secondary | ICD-10-CM | POA: Diagnosis not present

## 2016-04-19 DIAGNOSIS — M5136 Other intervertebral disc degeneration, lumbar region: Secondary | ICD-10-CM | POA: Diagnosis not present

## 2016-04-19 DIAGNOSIS — M5442 Lumbago with sciatica, left side: Secondary | ICD-10-CM | POA: Diagnosis not present

## 2016-04-28 DIAGNOSIS — M5416 Radiculopathy, lumbar region: Secondary | ICD-10-CM | POA: Diagnosis not present

## 2016-04-30 DIAGNOSIS — M5416 Radiculopathy, lumbar region: Secondary | ICD-10-CM | POA: Diagnosis not present

## 2016-05-02 ENCOUNTER — Other Ambulatory Visit: Payer: Self-pay | Admitting: Cardiovascular Disease

## 2016-05-03 ENCOUNTER — Other Ambulatory Visit: Payer: Self-pay | Admitting: Cardiovascular Disease

## 2016-05-03 DIAGNOSIS — M5416 Radiculopathy, lumbar region: Secondary | ICD-10-CM | POA: Diagnosis not present

## 2016-05-07 DIAGNOSIS — M5416 Radiculopathy, lumbar region: Secondary | ICD-10-CM | POA: Diagnosis not present

## 2016-05-10 DIAGNOSIS — M5416 Radiculopathy, lumbar region: Secondary | ICD-10-CM | POA: Diagnosis not present

## 2016-05-12 DIAGNOSIS — M5416 Radiculopathy, lumbar region: Secondary | ICD-10-CM | POA: Diagnosis not present

## 2016-05-14 ENCOUNTER — Other Ambulatory Visit (HOSPITAL_COMMUNITY): Payer: Self-pay | Admitting: Cardiology

## 2016-05-14 DIAGNOSIS — M5416 Radiculopathy, lumbar region: Secondary | ICD-10-CM | POA: Diagnosis not present

## 2016-05-18 DIAGNOSIS — M5136 Other intervertebral disc degeneration, lumbar region: Secondary | ICD-10-CM | POA: Diagnosis not present

## 2016-05-18 DIAGNOSIS — M25552 Pain in left hip: Secondary | ICD-10-CM | POA: Diagnosis not present

## 2016-05-18 DIAGNOSIS — M5416 Radiculopathy, lumbar region: Secondary | ICD-10-CM | POA: Diagnosis not present

## 2016-05-23 ENCOUNTER — Other Ambulatory Visit (HOSPITAL_COMMUNITY): Payer: Self-pay | Admitting: Cardiology

## 2016-06-03 ENCOUNTER — Telehealth (HOSPITAL_COMMUNITY): Payer: Self-pay | Admitting: *Deleted

## 2016-06-03 NOTE — Telephone Encounter (Addendum)
[name] called saying she noticed her mother to have increased labored breathing with activity.  Has an appointment scheduled for Jan 11th but was concerned if she needed to bring her in sooner.    [name] said her mother is compliant with Lasix taking 60mg  in the morning and 40mg  in the evening.    Spoke with Dr. Aundra Dubin and he advised patient to increase lasix to 80 mg in the AM and 80 mg in the PM for today only, then take 60 mg BID until patient comes for her appointment next week.   [name] is aware and agreeable with medication change and will call us if patient continues having labored breathing.

## 2016-06-03 NOTE — Telephone Encounter (Deleted)
Dr. Aundra Dubin advised patient to take lasix 80 mg BID today then take 60 mg BID until she comes for her appointment January 11th.    [name] is aware and agreeable.  She will call if patient continues having SOB.

## 2016-06-10 ENCOUNTER — Encounter (HOSPITAL_COMMUNITY): Payer: Self-pay

## 2016-06-10 ENCOUNTER — Ambulatory Visit (HOSPITAL_COMMUNITY)
Admission: RE | Admit: 2016-06-10 | Discharge: 2016-06-10 | Disposition: A | Discharge status: Home / Self Care | Payer: Medicare Other | Source: Ambulatory Visit | Attending: Cardiology | Admitting: Cardiology

## 2016-06-10 ENCOUNTER — Encounter (HOSPITAL_COMMUNITY): Payer: Self-pay | Admitting: *Deleted

## 2016-06-10 VITALS — BP 120/78 | HR 90 | Wt 217.0 lb

## 2016-06-10 DIAGNOSIS — J449 Chronic obstructive pulmonary disease, unspecified: Secondary | ICD-10-CM | POA: Insufficient documentation

## 2016-06-10 DIAGNOSIS — Z79899 Other long term (current) drug therapy: Secondary | ICD-10-CM | POA: Diagnosis not present

## 2016-06-10 DIAGNOSIS — I4819 Other persistent atrial fibrillation: Secondary | ICD-10-CM

## 2016-06-10 DIAGNOSIS — I5032 Chronic diastolic (congestive) heart failure: Secondary | ICD-10-CM | POA: Insufficient documentation

## 2016-06-10 DIAGNOSIS — E0590 Thyrotoxicosis, unspecified without thyrotoxic crisis or storm: Secondary | ICD-10-CM | POA: Diagnosis not present

## 2016-06-10 DIAGNOSIS — Z7901 Long term (current) use of anticoagulants: Secondary | ICD-10-CM | POA: Diagnosis not present

## 2016-06-10 DIAGNOSIS — I481 Persistent atrial fibrillation: Secondary | ICD-10-CM | POA: Diagnosis not present

## 2016-06-10 DIAGNOSIS — I350 Nonrheumatic aortic (valve) stenosis: Secondary | ICD-10-CM | POA: Insufficient documentation

## 2016-06-10 LAB — CBC
HCT: 40.2 % (ref 36.0–46.0)
Hemoglobin: 12.8 g/dL (ref 12.0–15.0)
MCH: 26.7 pg (ref 26.0–34.0)
MCHC: 31.8 g/dL (ref 30.0–36.0)
MCV: 83.8 fL (ref 78.0–100.0)
PLATELETS: 209 10*3/uL (ref 150–400)
RBC: 4.8 MIL/uL (ref 3.87–5.11)
RDW: 13.4 % (ref 11.5–15.5)
WBC: 9.3 10*3/uL (ref 4.0–10.5)

## 2016-06-10 LAB — BASIC METABOLIC PANEL
Anion gap: 12 (ref 5–15)
BUN: 7 mg/dL (ref 6–20)
CHLORIDE: 101 mmol/L (ref 101–111)
CO2: 29 mmol/L (ref 22–32)
CREATININE: 1.08 mg/dL — AB (ref 0.44–1.00)
Calcium: 10.2 mg/dL (ref 8.9–10.3)
GFR calc Af Amer: 55 mL/min — ABNORMAL LOW (ref >60)
GFR calc non Af Amer: 48 mL/min — ABNORMAL LOW (ref >60)
Glucose, Bld: 123 mg/dL — ABNORMAL HIGH (ref 65–99)
Potassium: 3.9 mmol/L (ref 3.5–5.1)
Sodium: 142 mmol/L (ref 135–145)

## 2016-06-10 LAB — BRAIN NATRIURETIC PEPTIDE: B Natriuretic Peptide: 129.2 pg/mL — ABNORMAL HIGH (ref 0.0–100.0)

## 2016-06-10 MED ORDER — FUROSEMIDE 40 MG PO TABS
60.0000 mg | ORAL_TABLET | Freq: Two times a day (BID) | ORAL | 6 refills | Status: DC
Start: 2016-06-10 — End: 2016-06-23

## 2016-06-10 NOTE — Progress Notes (Signed)
Patient ID: Nicole [name], female   DOB: [birth year], 81 y.o.   MRN: [id number] PCP: Dr. Philipp Deputy [facility]) HF Cardiology: Dr. Aundra Dubin  Nicole [name] is a 81 yo with history of COPD (36 pack years quit April 2014), atrial fibrillation s/p AVR with pericardial valve, mitral annuloplasty, MAZE procedure and clipping of the LA appendage (99991111) and diastolic CHF. 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   She went back into atrial fibrillation around 5/16.  Given increased symptoms when in atrial fibrillation, I started her on amiodarone and planned for DCCV after amiodarone load. She went out of atrial fibrillation and into a stable junctional rhythm with rate in the 60s.  She also developed subclinical hyperthyroidism.  I stopped her amiodarone.  She saw an endocrinologist who recommended that she take methimazole.  However, she never started it.  No BRBPR or melena on Xarelto.   Around Christmas, she developed increased exertional dyspnea and lower extremity edema.  Weight rose.  I had her increase Lasix to 60 mg bid.  She has felt considerably better since then.  No dyspnea walking into the office today.  No orthopnea/PND.  No chest pain.  No lightheadedness.  She is back in atrial fibrillation today.   ECG: atrial fibrillation rate 88  Labs (9/14): K 4, creatinine 0.9 => 1.04, HCT 33.7, TnI 0.21 (while hospitalized in [place]), BNP 158, TSH normal, AST normal, ALT 39 Labs (03/16/13):  K 3.9 Cr 1.0 Labs (6/16): K 4.3, creatinine 0.99, Hgb 14.6 Labs (1/17): K 4.3, creatinine 1.08 Labs (2/17): K 3.5, creatinine 1.04, BNP 220, free T3 normal, free T4 high, TSH 0.09, LFTs normal Labs (6/17): K 3.7, creatinine 1.04, TSH/free T3/free T4 normal, LDL 54, HDL 54  PMH: 1. COPD: PFTs (5/15) with FEV1 61%, RVC 63%, ratio 96%, DLCO 50%.   2. Atrial fibrillation: First noted in 7/14.  DCCV in 8/14 to NSR.  Recurred by 9/14.  DCCV to NSR 9/14 but recurred. S/p Maze procedure 10/14.  Recurrent atrial fibrillation around 5/16,  later developed a junctional rhythm on amiodarone and amiodarone stopped.  She was noted to be back in atrial fibrillation in 1/18.  3. Hyperlipidemia 4. Diastolic CHF: Echo (123456) with EF 55-60%, grade II diastolic dysfunction, mild LVH, moderate to severe AS with mean gradient 33 mmHg/peak 49 mmHg and AVA 0.74 cm^2.  Echo (2/17) with EF 60-65%, mild focal basal septal hypertrophy, bioprosthetic aortic valve appeared normal, s/p mitral valve repair with no significant mitral stenosis.  5. Severe AS/Moderate MR. 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 Normal coronaries cath 10/14 7. Ventral hernia.  8. Low back pain: Lumbar disc disease.  9. Hyperthyroidism: Subclinical.  Likely related to amiodarone use.  10. Junctional rhythm.   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  Lives alone.  Nicole [name] come to appointments.   FH: No premature CAD.  Breast cancer.   ROS: All systems reviewed and negative except as per HPI.   Current Outpatient Prescriptions  Medication Sig Dispense Refill  . ALPRAZolam (XANAX) 0.25 MG tablet Take 1 tablet by mouth 2 (two) times daily as needed. Reported on 11/06/2015    . atorvastatin (LIPITOR) 40 MG tablet TAKE ONE TABLET BY MOUTH ONCE DAILY 90 tablet 0  . diltiazem (CARDIZEM CD) 240 MG 24 hr capsule Take 1 capsule (240 mg total) by mouth daily. 90 capsule 3  . Fluticasone Furoate-Vilanterol 100-25 MCG/INH AEPB Inhale 1  puff into the lungs daily.    . furosemide (LASIX) 40 MG tablet Take 1.5 tablets (60 mg total) by mouth 2 (two) times daily. 225 tablet 6  . ipratropium-albuterol (DUONEB) 0.5-2.5 (3) MG/3ML SOLN Take 3 mLs by nebulization every 6 (six) hours as needed.    . Multiple Vitamins-Minerals (MULTIVITAMIN WITH MINERALS) tablet Take 1 tablet by mouth daily.    . potassium chloride SA (K-DUR,KLOR-CON) 20 MEQ tablet Take 1 tablet (20 mEq total) by mouth daily. 90 tablet 3  .  sertraline (ZOLOFT) 25 MG tablet TAKE 1 TABLET BY MOUTH ONCE DAILY 90 tablet 0  . sertraline (ZOLOFT) 50 MG tablet Take 50 mg by mouth daily.    [name] 20 MG TABS tablet TAKE 1 TABLET BY MOUTH ONCE DAILY WITH SUPPER NEED OFFICE VISIT 30 tablet 2   No current facility-administered medications for this encounter.     Vitals:   06/10/16 1109  BP: 120/78  Pulse: 90  SpO2: 97%  Weight: 217 lb (98.4 kg)   Physical Exam: General: NAD Neck: JVP 7 cm, no thyromegaly or thyroid nodule.  Lungs: Decreased throughout. Clear CV: Irregular S1S2. 1/6 early SEM at RUSB. No carotid bruit.  No edema.  Bilateral lower leg varicosities.  Abdomen: obese. soft, nontender, no hepatosplenomegaly, no distention.  Skin: Intact without lesions or rashes.  Neurologic: Alert and oriented x 3.  Psych: Normal affect. Extremities: No clubbing or cyanosis. No edema.   HEENT: Normal.   Assessment/Plan:  1. Valvular heart disease: Severe aortic stenosis and moderate MR, s/p AVR (tissue) and MV ring with Maze procedure 03/15/13.  - Aortic and mitral valves appeared stable on 2/17 echo.  2. Atrial fibrillation: s/p MAZE. She had been on amiodarone to keep her in NSR.  She went into a regular junctional rhythm on amiodarone.  She also developed hyperthyroidism in the setting of amiodarone use. Amiodarone was stopped. She is back in atrial fibrillation today.  Onset of atrial fibrillation may be cause of her CHF exacerbation around Christmas.  - Continue anticoagulation with Xarelto.  She has refused warfarin, and most recent data suggests that DOACs should be ok with bioprosthetic valves.  CBC today.  - Given prior MAZE, she is probably not a good atrial fibrillation ablation candidate.  - She will stay off amiodarone.  - I will try DCCV next week as atrial fibrillation worsens her CHF (has not missed any Xarelto doses).  If this does not hold, then likely will need to follow rate control plan.  3. Chronic diastolic  CHF: NYHA II-III symptoms, some of symptoms likely due to COPD.  Recent worsening of dyspnea, suspect due to volume overload in setting of recurrence of atrial fibrillation.  On higher Lasix dose, symptoms have resolved.  She looks euvolemic today.  - Continue Lasix 60 mg bid.  BMET/BNP today.  4. COPD: stable. She no longer is smoking. Sees Dr. Luan Pulling.  5. Hyperthyroidism: Most recent thyroid indices back to normal off amiodarone.    Followup in 1 month.    [name] 06/10/2016

## 2016-06-10 NOTE — Patient Instructions (Signed)
Labs today (will call for abnormal results, otherwise no news is good news)  Cardioversion has been scheduled for you for January 24th, please see attached directions  INCREASE Lasix to 60 mg (1.5 Tabs) Two times daily.  Follow up in 1 Month

## 2016-06-11 ENCOUNTER — Other Ambulatory Visit (HOSPITAL_COMMUNITY): Payer: Self-pay | Admitting: *Deleted

## 2016-06-23 ENCOUNTER — Ambulatory Visit (HOSPITAL_COMMUNITY): Payer: Medicare Other | Admitting: Anesthesiology

## 2016-06-23 ENCOUNTER — Encounter (HOSPITAL_COMMUNITY): Payer: Self-pay

## 2016-06-23 ENCOUNTER — Encounter (HOSPITAL_COMMUNITY)
Admission: RE | Disposition: A | Discharge status: Home / Self Care | Payer: Self-pay | Source: Ambulatory Visit | Attending: Cardiology

## 2016-06-23 ENCOUNTER — Ambulatory Visit (HOSPITAL_COMMUNITY)
Admission: RE | Admit: 2016-06-23 | Discharge: 2016-06-23 | Disposition: A | Discharge status: Home / Self Care | Payer: Medicare Other | Source: Ambulatory Visit | Attending: Cardiology | Admitting: Cardiology

## 2016-06-23 DIAGNOSIS — I5032 Chronic diastolic (congestive) heart failure: Secondary | ICD-10-CM | POA: Diagnosis not present

## 2016-06-23 DIAGNOSIS — Z87891 Personal history of nicotine dependence: Secondary | ICD-10-CM | POA: Insufficient documentation

## 2016-06-23 DIAGNOSIS — I2510 Atherosclerotic heart disease of native coronary artery without angina pectoris: Secondary | ICD-10-CM | POA: Insufficient documentation

## 2016-06-23 DIAGNOSIS — Z79899 Other long term (current) drug therapy: Secondary | ICD-10-CM | POA: Diagnosis not present

## 2016-06-23 DIAGNOSIS — Z952 Presence of prosthetic heart valve: Secondary | ICD-10-CM | POA: Insufficient documentation

## 2016-06-23 DIAGNOSIS — E0590 Thyrotoxicosis, unspecified without thyrotoxic crisis or storm: Secondary | ICD-10-CM | POA: Insufficient documentation

## 2016-06-23 DIAGNOSIS — E785 Hyperlipidemia, unspecified: Secondary | ICD-10-CM | POA: Diagnosis not present

## 2016-06-23 DIAGNOSIS — M4646 Discitis, unspecified, lumbar region: Secondary | ICD-10-CM | POA: Diagnosis not present

## 2016-06-23 DIAGNOSIS — Z7901 Long term (current) use of anticoagulants: Secondary | ICD-10-CM | POA: Insufficient documentation

## 2016-06-23 DIAGNOSIS — I4891 Unspecified atrial fibrillation: Secondary | ICD-10-CM | POA: Insufficient documentation

## 2016-06-23 DIAGNOSIS — J449 Chronic obstructive pulmonary disease, unspecified: Secondary | ICD-10-CM | POA: Diagnosis not present

## 2016-06-23 DIAGNOSIS — I5023 Acute on chronic systolic (congestive) heart failure: Secondary | ICD-10-CM | POA: Diagnosis not present

## 2016-06-23 DIAGNOSIS — I110 Hypertensive heart disease with heart failure: Secondary | ICD-10-CM | POA: Diagnosis not present

## 2016-06-23 HISTORY — PX: CARDIOVERSION: SHX1299

## 2016-06-23 LAB — POCT I-STAT 4, (NA,K, GLUC, HGB,HCT)
Glucose, Bld: 107 mg/dL — ABNORMAL HIGH (ref 65–99)
HEMATOCRIT: 36 % (ref 36.0–46.0)
HEMOGLOBIN: 12.2 g/dL (ref 12.0–15.0)
POTASSIUM: 3.4 mmol/L — AB (ref 3.5–5.1)
Sodium: 141 mmol/L (ref 135–145)

## 2016-06-23 SURGERY — CARDIOVERSION
Anesthesia: General

## 2016-06-23 MED ORDER — SODIUM CHLORIDE 0.9 % IV SOLN
INTRAVENOUS | Status: DC | PRN
  Administered 2016-06-23: 13:00:00 via INTRAVENOUS

## 2016-06-23 MED ORDER — FUROSEMIDE 40 MG PO TABS: 40.0000 mg | ORAL_TABLET | Freq: Every day | ORAL | 6 refills | Status: DC

## 2016-06-23 MED ORDER — PROPOFOL 10 MG/ML IV BOLUS
INTRAVENOUS | Status: DC | PRN
  Administered 2016-06-23: 50 mg via INTRAVENOUS
  Administered 2016-06-23 (×5): 10 mg via INTRAVENOUS

## 2016-06-23 MED ORDER — POTASSIUM CHLORIDE CRYS ER 20 MEQ PO TBCR: 40.0000 meq | EXTENDED_RELEASE_TABLET | Freq: Every day | ORAL | 3 refills | Status: DC

## 2016-06-23 NOTE — Procedures (Signed)
Electrical Cardioversion Procedure Note Nicole [name] [id number] [birth year]  Procedure: Electrical Cardioversion Indications:  Atrial Fibrillation.  She has been on Xarelto without missing doses.   Procedure Details Consent: Risks of procedure as well as the alternatives and risks of each were explained to the (patient/caregiver).  Consent for procedure obtained. Time Out: Verified patient identification, verified procedure, site/side was marked, verified correct patient position, special equipment/implants available, medications/allergies/relevent history reviewed, required imaging and test results available.  Performed  Patient placed on cardiac monitor, pulse oximetry, supplemental oxygen as necessary.  Sedation given: Propofol per anesthesiology Pacer pads placed anterior and posterior chest.  Cardioverted 2 time(s).  Cardioverted at [place].  Evaluation Findings: Post procedure EKG shows: Atrial Fibrillation.  After cardioversions, patient went into what appeared to be a junctional rhythm but then atrial fibrillation would recur.  Complications: None Patient did tolerate procedure well.  For now, will plan rate control/anticoagulation strategy.   [name] 06/23/2016, 1:54 PM

## 2016-06-23 NOTE — Anesthesia Preprocedure Evaluation (Addendum)
Anesthesia Evaluation  Patient identified by MRN, date of birth, ID band Patient awake    Reviewed: Allergy & Precautions, NPO status , Patient's Chart, lab work & pertinent test results  Airway Mallampati: I  TM Distance: >3 FB Neck ROM: Full    Dental  (+) Lower Dentures, Dental Advisory Given, Upper Dentures   Pulmonary COPD, former smoker,    breath sounds clear to auscultation       Cardiovascular + CAD and +CHF   Rhythm:Irregular     Neuro/Psych    GI/Hepatic   Endo/Other  Hyperthyroidism   Renal/GU      Musculoskeletal   Abdominal   Peds  Hematology   Anesthesia Other Findings   Reproductive/Obstetrics                            Anesthesia Physical Anesthesia Plan  ASA: III  Anesthesia Plan: General   Post-op Pain Management:    Induction: Intravenous  Airway Management Planned: Simple Face Mask  Additional Equipment: None  Intra-op Plan:   Post-operative Plan:   Informed Consent: I have reviewed the patients History and Physical, chart, labs and discussed the procedure including the risks, benefits and alternatives for the proposed anesthesia with the patient or authorized representative who has indicated his/her understanding and acceptance.   Dental advisory given  Plan Discussed with: CRNA, Anesthesiologist and Surgeon  Anesthesia Plan Comments:         Anesthesia Quick Evaluation

## 2016-06-23 NOTE — H&P (View-Only) (Signed)
Patient ID: Nicole [name], female   DOB: [birth year], 81 y.o.   MRN: [id number] PCP: Dr. Philipp Deputy [facility]) HF Cardiology: Dr. Aundra Dubin  [name] is a 81 yo with history of COPD (37 pack years quit April 2014), atrial fibrillation s/p AVR with pericardial valve, mitral annuloplasty, MAZE procedure and clipping of the LA appendage (99991111) and diastolic CHF. Daughter is Personnel officer ([employer] nurse).   She went back into atrial fibrillation around 5/16.  Given increased symptoms when in atrial fibrillation, I started her on amiodarone and planned for DCCV after amiodarone load. She went out of atrial fibrillation and into a stable junctional rhythm with rate in the 60s.  She also developed subclinical hyperthyroidism.  I stopped her amiodarone.  She saw an endocrinologist who recommended that she take methimazole.  However, she never started it.  No BRBPR or melena on Xarelto.   Around Christmas, she developed increased exertional dyspnea and lower extremity edema.  Weight rose.  I had her increase Lasix to 60 mg bid.  She has felt considerably better since then.  No dyspnea walking into the office today.  No orthopnea/PND.  No chest pain.  No lightheadedness.  She is back in atrial fibrillation today.   ECG: atrial fibrillation rate 88  Labs (9/14): K 4, creatinine 0.9 => 1.04, HCT 33.7, TnI 0.21 (while hospitalized in [place]), BNP 158, TSH normal, AST normal, ALT 39 Labs (03/16/13):  K 3.9 Cr 1.0 Labs (6/16): K 4.3, creatinine 0.99, Hgb 14.6 Labs (1/17): K 4.3, creatinine 1.08 Labs (2/17): K 3.5, creatinine 1.04, BNP 220, free T3 normal, free T4 high, TSH 0.09, LFTs normal Labs (6/17): K 3.7, creatinine 1.04, TSH/free T3/free T4 normal, LDL 54, HDL 54  PMH: 1. COPD: PFTs (5/15) with FEV1 61%, RVC 63%, ratio 96%, DLCO 50%.   2. Atrial fibrillation: First noted in 7/14.  DCCV in 8/14 to NSR.  Recurred by 9/14.  DCCV to NSR 9/14 but recurred. S/p Maze procedure 10/14.  Recurrent atrial fibrillation around 5/16,  later developed a junctional rhythm on amiodarone and amiodarone stopped.  She was noted to be back in atrial fibrillation in 1/18.  3. Hyperlipidemia 4. Diastolic CHF: Echo (123456) with EF 55-60%, grade II diastolic dysfunction, mild LVH, moderate to severe AS with mean gradient 33 mmHg/peak 49 mmHg and AVA 0.74 cm^2.  Echo (2/17) with EF 60-65%, mild focal basal septal hypertrophy, bioprosthetic aortic valve appeared normal, s/p mitral valve repair with no significant mitral stenosis.  5. Severe AS/Moderate MR. 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 Normal coronaries cath 10/14 7. Ventral hernia.  8. Low back pain: Lumbar disc disease.  9. Hyperthyroidism: Subclinical.  Likely related to amiodarone use.  10. Junctional rhythm.   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  Daughter come to appointments.   FH: No premature CAD.  Breast cancer.   ROS: All systems reviewed and negative except as per HPI.   Current Outpatient Prescriptions  Medication Sig Dispense Refill  . ALPRAZolam (XANAX) 0.25 MG tablet Take 1 tablet by mouth 2 (two) times daily as needed. Reported on 11/06/2015    . atorvastatin (LIPITOR) 40 MG tablet TAKE ONE TABLET BY MOUTH ONCE DAILY 90 tablet 0  . diltiazem (CARDIZEM CD) 240 MG 24 hr capsule Take 1 capsule (240 mg total) by mouth daily. 90 capsule 3  . Fluticasone Furoate-Vilanterol 100-25 MCG/INH AEPB Inhale 1  puff into the lungs daily.    . furosemide (LASIX) 40 MG tablet Take 1.5 tablets (60 mg total) by mouth 2 (two) times daily. 225 tablet 6  . ipratropium-albuterol (DUONEB) 0.5-2.5 (3) MG/3ML SOLN Take 3 mLs by nebulization every 6 (six) hours as needed.    . Multiple Vitamins-Minerals (MULTIVITAMIN WITH MINERALS) tablet Take 1 tablet by mouth daily.    . potassium chloride SA (K-DUR,KLOR-CON) 20 MEQ tablet Take 1 tablet (20 mEq total) by mouth daily. 90 tablet 3  .  sertraline (ZOLOFT) 25 MG tablet TAKE 1 TABLET BY MOUTH ONCE DAILY 90 tablet 0  . sertraline (ZOLOFT) 50 MG tablet Take 50 mg by mouth daily.    [name] 20 MG TABS tablet TAKE 1 TABLET BY MOUTH ONCE DAILY WITH SUPPER NEED OFFICE VISIT 30 tablet 2   No current facility-administered medications for this encounter.     Vitals:   06/10/16 1109  BP: 120/78  Pulse: 90  SpO2: 97%  Weight: 217 lb (98.4 kg)   Physical Exam: General: NAD Neck: JVP 7 cm, no thyromegaly or thyroid nodule.  Lungs: Decreased throughout. Clear CV: Irregular S1S2. 1/6 early SEM at RUSB. No carotid bruit.  No edema.  Bilateral lower leg varicosities.  Abdomen: obese. soft, nontender, no hepatosplenomegaly, no distention.  Skin: Intact without lesions or rashes.  Neurologic: Alert and oriented x 3.  Psych: Normal affect. Extremities: No clubbing or cyanosis. No edema.   HEENT: Normal.   Assessment/Plan:  1. Valvular heart disease: Severe aortic stenosis and moderate MR, s/p AVR (tissue) and MV ring with Maze procedure 03/15/13.  - Aortic and mitral valves appeared stable on 2/17 echo.  2. Atrial fibrillation: s/p MAZE. She had been on amiodarone to keep her in NSR.  She went into a regular junctional rhythm on amiodarone.  She also developed hyperthyroidism in the setting of amiodarone use. Amiodarone was stopped. She is back in atrial fibrillation today.  Onset of atrial fibrillation may be cause of her CHF exacerbation around Christmas.  - Continue anticoagulation with Xarelto.  She has refused warfarin, and most recent data suggests that DOACs should be ok with bioprosthetic valves.  CBC today.  - Given prior MAZE, she is probably not a good atrial fibrillation ablation candidate.  - She will stay off amiodarone.  - I will try DCCV next week as atrial fibrillation worsens her CHF (has not missed any Xarelto doses).  If this does not hold, then likely will need to follow rate control plan.  3. Chronic diastolic  CHF: NYHA II-III symptoms, some of symptoms likely due to COPD.  Recent worsening of dyspnea, suspect due to volume overload in setting of recurrence of atrial fibrillation.  On higher Lasix dose, symptoms have resolved.  She looks euvolemic today.  - Continue Lasix 60 mg bid.  BMET/BNP today.  4. COPD: stable. She no longer is smoking. Sees Dr. Luan Pulling.  5. Hyperthyroidism: Most recent thyroid indices back to normal off amiodarone.    Followup in 1 month.    [name] 06/10/2016

## 2016-06-23 NOTE — Interval H&P Note (Signed)
History and Physical Interval Note:  06/23/2016 1:28 PM  Nicole [name]  has presented today for surgery, with the diagnosis of afib  The various methods of treatment have been discussed with the patient and family. After consideration of risks, benefits and other options for treatment, the patient has consented to  Procedure(s): CARDIOVERSION (N/A) as a surgical intervention .  The patient's history has been reviewed, patient examined, no change in status, stable for surgery.  I have reviewed the patient's chart and labs.  Questions were answered to the patient's satisfaction.     [place] [employer]

## 2016-06-23 NOTE — Anesthesia Postprocedure Evaluation (Addendum)
Anesthesia Post Note  Patient: Nicole [name]  Procedure(s) Performed: Procedure(s) (LRB): CARDIOVERSION (N/A)  Patient location during evaluation: Endoscopy Anesthesia Type: General Level of consciousness: awake Pain management: pain level controlled Vital Signs Assessment: post-procedure vital signs reviewed and stable Respiratory status: spontaneous breathing, nonlabored ventilation, respiratory function stable and patient connected to nasal cannula oxygen Cardiovascular status: stable Postop Assessment: no signs of nausea or vomiting Anesthetic complications: no       Last Vitals:  Vitals:   06/23/16 1159 06/23/16 1338  BP: (!) 125/51 (!) 92/54  Pulse: 83 68  Resp: (!) 26 (!) 22    Last Pain:  Vitals:   06/23/16 1159  TempSrc: Oral                 [name]

## 2016-06-23 NOTE — Transfer of Care (Signed)
Immediate Anesthesia Transfer of Care Note  Patient: Nicole [name]  Procedure(s) Performed: Procedure(s): CARDIOVERSION (N/A)  Patient Location: Endoscopy Unit  Anesthesia Type:General  Level of Consciousness: awake and alert   Airway & Oxygen Therapy: Patient Spontanous Breathing and Patient connected to nasal cannula oxygen  Post-op Assessment: Report given to RN and Post -op Vital signs reviewed and stable  Post vital signs: Reviewed and stable  Last Vitals:  Vitals:   06/23/16 1159  BP: (!) 125/51  Pulse: 83  Resp: (!) 26    Last Pain:  Vitals:   06/23/16 1159  TempSrc: Oral         Complications: No apparent anesthesia complications

## 2016-06-23 NOTE — Discharge Instructions (Signed)
Electrical Cardioversion, Care After °This sheet gives you information about how to care for yourself after your procedure. Your health care provider may also give you more specific instructions. If you have problems or questions, contact your health care provider. °What can I expect after the procedure? °After the procedure, it is common to have: °· Some redness on the skin where the shocks were given. °Follow these instructions at home: °· Do not drive for 24 hours if you were given a medicine to help you relax (sedative). °· Take over-the-counter and prescription medicines only as told by your health care provider. °· Ask your health care provider how to check your pulse. Check it often. °· Rest for 48 hours after the procedure or as told by your health care provider. °· Avoid or limit your caffeine use as told by your health care provider. °Contact a health care provider if: °· You feel like your heart is beating too quickly or your pulse is not regular. °· You have a serious muscle cramp that does not go away. °Get help right away if: °· You have discomfort in your chest. °· You are dizzy or you feel faint. °· You have trouble breathing or you are short of breath. °· Your speech is slurred. °· You have trouble moving an arm or leg on one side of your body. °· Your fingers or toes turn cold or blue. °This information is not intended to replace advice given to you by your health care provider. Make sure you discuss any questions you have with your health care provider. °Document Released: 03/07/2013 Document Revised: 12/19/2015 Document Reviewed: 11/21/2015 °Elsevier Interactive Patient Education © 2017 [employer] ° °

## 2016-06-24 ENCOUNTER — Encounter (HOSPITAL_COMMUNITY): Payer: Self-pay | Admitting: Cardiology

## 2016-07-13 ENCOUNTER — Ambulatory Visit (HOSPITAL_COMMUNITY)
Admission: RE | Admit: 2016-07-13 | Discharge: 2016-07-13 | Disposition: A | Discharge status: Home / Self Care | Payer: Medicare Other | Source: Ambulatory Visit | Attending: Cardiology | Admitting: Cardiology

## 2016-07-13 VITALS — BP 144/82 | HR 84 | Wt 219.0 lb

## 2016-07-13 DIAGNOSIS — I4819 Other persistent atrial fibrillation: Secondary | ICD-10-CM

## 2016-07-13 DIAGNOSIS — Z79899 Other long term (current) drug therapy: Secondary | ICD-10-CM | POA: Diagnosis not present

## 2016-07-13 DIAGNOSIS — E0590 Thyrotoxicosis, unspecified without thyrotoxic crisis or storm: Secondary | ICD-10-CM | POA: Insufficient documentation

## 2016-07-13 DIAGNOSIS — I5032 Chronic diastolic (congestive) heart failure: Secondary | ICD-10-CM | POA: Diagnosis not present

## 2016-07-13 DIAGNOSIS — J449 Chronic obstructive pulmonary disease, unspecified: Secondary | ICD-10-CM | POA: Insufficient documentation

## 2016-07-13 DIAGNOSIS — Z87891 Personal history of nicotine dependence: Secondary | ICD-10-CM | POA: Diagnosis not present

## 2016-07-13 DIAGNOSIS — I340 Nonrheumatic mitral (valve) insufficiency: Secondary | ICD-10-CM | POA: Insufficient documentation

## 2016-07-13 DIAGNOSIS — I350 Nonrheumatic aortic (valve) stenosis: Secondary | ICD-10-CM | POA: Diagnosis present

## 2016-07-13 DIAGNOSIS — I481 Persistent atrial fibrillation: Secondary | ICD-10-CM | POA: Insufficient documentation

## 2016-07-13 DIAGNOSIS — Z7901 Long term (current) use of anticoagulants: Secondary | ICD-10-CM | POA: Insufficient documentation

## 2016-07-13 DIAGNOSIS — I38 Endocarditis, valve unspecified: Secondary | ICD-10-CM | POA: Diagnosis present

## 2016-07-13 MED ORDER — FUROSEMIDE 40 MG PO TABS: ORAL_TABLET | ORAL | 3 refills | Status: DC

## 2016-07-13 MED ORDER — POTASSIUM CHLORIDE CRYS ER 20 MEQ PO TBCR: EXTENDED_RELEASE_TABLET | ORAL | 3 refills | Status: DC

## 2016-07-13 NOTE — Progress Notes (Signed)
Patient ID: Nicole [name], female   DOB: 24-Oct-1935, 81 y.o.   MRN: [id number] PCP: Dr. Philipp Deputy [facility]) HF Cardiology: Dr. Aundra Dubin  Nicole [name] is a 81 yo with history of COPD (76 pack years quit April 2014), atrial fibrillation s/p AVR with pericardial valve, mitral annuloplasty, MAZE procedure and clipping of the LA appendage (99991111) and diastolic CHF. Daughter is Personnel officer (CCU nurse).   She went back into atrial fibrillation around 5/16.  Given increased symptoms when in atrial fibrillation, I started her on amiodarone and planned for DCCV after amiodarone load. She went out of atrial fibrillation and into a stable junctional rhythm with rate in the 60s.  She also developed subclinical hyperthyroidism.  I stopped her amiodarone.  She saw an endocrinologist who recommended that she take methimazole.  However, she never started it.  No BRBPR or melena on Xarelto.   Around Christmas, she developed increased exertional dyspnea and lower extremity edema.  Weight rose.  I had her increase Lasix to 60 mg bid.  She has felt considerably better since then.  She is still short of breath, however, after walking about 100 feet.  She is able to get around the grocery store.  No chest pain.  No orthopnea/PND.  No BRBPR/melena.  At last appointment, I noted that she was in atrial fibrillation.  She did not notice it.  We decided to give her a trial of DCCV without starting an antiarrhythmic.  DCCV was attempted in 1/18 but failed. She remains in atrial fibrillation today.   ECG: atrial fibrillation, nonspecific T wave flattening.   Labs (9/14): K 4, creatinine 0.9 => 1.04, HCT 33.7, TnI 0.21 (while hospitalized in [place]), BNP 158, TSH normal, AST normal, ALT 39 Labs (03/16/13):  K 3.9 Cr 1.0 Labs (6/16): K 4.3, creatinine 0.99, Hgb 14.6 Labs (1/17): K 4.3, creatinine 1.08 Labs (2/17): K 3.5, creatinine 1.04, BNP 220, free T3 normal, free T4 high, TSH 0.09, LFTs normal Labs (6/17): K 3.7, creatinine 1.04,  TSH/free T3/free T4 normal, LDL 54, HDL 54 Labs (1/18): K 3.9, creatinine 1.08, BNP 129, HCT 40.2  PMH: 1. COPD: PFTs (5/15) with FEV1 61%, RVC 63%, ratio 96%, DLCO 50%.   2. Atrial fibrillation: First noted in 7/14.  DCCV in 8/14 to NSR.  Recurred by 9/14.  DCCV to NSR 9/14 but recurred. S/p Maze procedure 10/14.  Recurrent atrial fibrillation around 5/16, later developed a junctional rhythm on amiodarone and amiodarone stopped.  She was noted to be back in atrial fibrillation in 1/18, failed DCCV in 1/18.  3. Hyperlipidemia 4. Diastolic CHF: Echo (123456) with EF 55-60%, grade II diastolic dysfunction, mild LVH, moderate to severe AS with mean gradient 33 mmHg/peak 49 mmHg and AVA 0.74 cm^2.  Echo (2/17) with EF 60-65%, mild focal basal septal hypertrophy, bioprosthetic aortic valve appeared normal, s/p mitral valve repair with no significant mitral stenosis.  5. Severe AS/Moderate MR. 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 Normal coronaries cath 10/14 7. Ventral hernia.  8. Low back pain: Lumbar disc disease.  9. Hyperthyroidism: Subclinical.  Likely related to amiodarone use.  10. Junctional rhythm.   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  Lives alone.  Daughter come to appointments.   FH: No premature CAD.  Breast cancer.   ROS: All systems reviewed and negative except as per HPI.   Current Outpatient Prescriptions  Medication Sig Dispense Refill  .  ALPRAZolam (XANAX) 0.25 MG tablet Take 1 tablet by mouth 2 (two) times daily as needed. Reported on 11/06/2015    . atorvastatin (LIPITOR) 40 MG tablet TAKE ONE TABLET BY MOUTH ONCE DAILY 90 tablet 0  . diltiazem (CARDIZEM CD) 240 MG 24 hr capsule Take 1 capsule (240 mg total) by mouth daily. 90 capsule 3  . Fluticasone Furoate-Vilanterol 100-25 MCG/INH AEPB Inhale 1 puff into the lungs daily.    . furosemide (LASIX) 40 MG tablet Take 2 tabs Twice daily  every other day ALTERNATING with 2 tabs in AM and 1 tab in PM every other day 120 tablet 3  . ipratropium-albuterol (DUONEB) 0.5-2.5 (3) MG/3ML SOLN Take 3 mLs by nebulization every 6 (six) hours as needed.    . Multiple Vitamins-Minerals (MULTIVITAMIN WITH MINERALS) tablet Take 1 tablet by mouth daily.    . potassium chloride SA (K-DUR,KLOR-CON) 20 MEQ tablet Take 2 tabs in AM and 1 tab in PM 90 tablet 3  . sertraline (ZOLOFT) 50 MG tablet Take 50 mg by mouth daily.    [name] 20 MG TABS tablet TAKE 1 TABLET BY MOUTH ONCE DAILY WITH SUPPER NEED OFFICE VISIT 30 tablet 2   No current facility-administered medications for this encounter.     Vitals:   07/13/16 1425  BP: (!) 144/82  BP Location: Right Arm  Patient Position: Sitting  Cuff Size: Normal  Pulse: 84  SpO2: 97%  Weight: 219 lb (99.3 kg)   Physical Exam: General: NAD Neck: JVP 8 cm, no thyromegaly or thyroid nodule.  Lungs: Decreased throughout. Clear CV: Irregular S1S2. 1/6 early SEM at RUSB. No carotid bruit.  1+ ankle edema.  Bilateral lower leg varicosities.  Abdomen: obese. soft, nontender, no hepatosplenomegaly, no distention.  Skin: Intact without lesions or rashes.  Neurologic: Alert and oriented x 3.  Psych: Normal affect. Extremities: No clubbing or cyanosis. No edema.   HEENT: Normal.   Assessment/Plan:  1. Valvular heart disease: Severe aortic stenosis and moderate MR, s/p AVR (tissue) and MV ring with Maze procedure 03/15/13.  - Aortic and mitral valves appeared stable on 2/17 echo.  2. Atrial fibrillation: s/p MAZE. She had been on amiodarone to keep her in NSR.  She went into a regular junctional rhythm on amiodarone.  She also developed hyperthyroidism in the setting of amiodarone use. Amiodarone was stopped. She is now on persistent atrial fibrillation and failed DCCV in 1/18.  Onset of atrial fibrillation may be cause of her CHF exacerbation around Christmas.  - Continue anticoagulation with Xarelto.   She has refused warfarin, and most recent data suggests that DOACs should be ok with bioprosthetic valves.    - Given prior MAZE, she is probably not a good atrial fibrillation ablation candidate.  - She will stay off amiodarone.  At this point, will plan rate control/anticoagulation strategy.  3. Chronic diastolic CHF: NYHA II-III symptoms, some of symptoms likely due to COPD.  Mild volume overload on exam today.  - Increase Lasix to 80 mg bid alternating with 80 qam/40 qpm.  Increase KCl to 40 qam/20 qpm.  BMET in 2 wks.   4. COPD: stable. She no longer is smoking. Sees Dr. Luan Pulling.  5. Hyperthyroidism: Most recent thyroid indices back to normal off amiodarone.    Followup in 3 months.    [name] 07/13/2016

## 2016-07-13 NOTE — Patient Instructions (Signed)
Increase Furosemide to 80 mg Twice daily every other day ALTERNATING WITH 80 mg in AM and 40 mg in PM every other day   Increase Potassium to 40 meq (2 tabs) in AM and 20 meq (1 tab) in PM  Labs in 2 weeks in [place] recommends that you schedule a follow-up appointment in: 3 months

## 2016-07-27 DIAGNOSIS — I5032 Chronic diastolic (congestive) heart failure: Secondary | ICD-10-CM | POA: Diagnosis not present

## 2016-07-28 DIAGNOSIS — I4891 Unspecified atrial fibrillation: Secondary | ICD-10-CM | POA: Diagnosis not present

## 2016-07-28 DIAGNOSIS — J449 Chronic obstructive pulmonary disease, unspecified: Secondary | ICD-10-CM | POA: Diagnosis not present

## 2016-07-28 DIAGNOSIS — E669 Obesity, unspecified: Secondary | ICD-10-CM | POA: Diagnosis not present

## 2016-07-28 DIAGNOSIS — I509 Heart failure, unspecified: Secondary | ICD-10-CM | POA: Diagnosis not present

## 2016-08-08 ENCOUNTER — Other Ambulatory Visit (HOSPITAL_COMMUNITY): Payer: Self-pay | Admitting: Internal Medicine

## 2016-08-09 NOTE — Telephone Encounter (Signed)
Pt last saw Dr Aundra Dubin on 07/13/16, last labs 07/27/16 Creat 0.86, Age 81, Wt 99.3kg, CrCl 83.15.  Pt is on Appropriate dosage of Xarelto 20mg  QD.  Will refill rx.

## 2016-08-27 ENCOUNTER — Other Ambulatory Visit (HOSPITAL_COMMUNITY): Payer: Self-pay | Admitting: Cardiology

## 2016-09-01 ENCOUNTER — Other Ambulatory Visit (HOSPITAL_COMMUNITY): Payer: Self-pay | Admitting: Cardiology

## 2016-09-01 MED ORDER — ATORVASTATIN CALCIUM 40 MG PO TABS: 40.0000 mg | ORAL_TABLET | Freq: Every day | ORAL | 0 refills | Status: DC

## 2016-09-20 DIAGNOSIS — E669 Obesity, unspecified: Secondary | ICD-10-CM | POA: Diagnosis not present

## 2016-09-20 DIAGNOSIS — F419 Anxiety disorder, unspecified: Secondary | ICD-10-CM | POA: Diagnosis not present

## 2016-09-20 DIAGNOSIS — J449 Chronic obstructive pulmonary disease, unspecified: Secondary | ICD-10-CM | POA: Diagnosis not present

## 2016-09-20 DIAGNOSIS — I4891 Unspecified atrial fibrillation: Secondary | ICD-10-CM | POA: Diagnosis not present

## 2016-09-25 ENCOUNTER — Other Ambulatory Visit (HOSPITAL_COMMUNITY): Payer: Self-pay | Admitting: Cardiology

## 2016-10-08 ENCOUNTER — Other Ambulatory Visit (HOSPITAL_COMMUNITY): Payer: Self-pay | Admitting: *Deleted

## 2016-10-08 MED ORDER — SERTRALINE HCL 50 MG PO TABS
50.0000 mg | ORAL_TABLET | Freq: Every day | ORAL | 6 refills | Status: DC
Start: 2016-10-08 — End: 2017-06-14

## 2016-10-18 ENCOUNTER — Ambulatory Visit (HOSPITAL_COMMUNITY)
Admission: RE | Admit: 2016-10-18 | Discharge: 2016-10-18 | Disposition: A | Discharge status: Home / Self Care | Payer: Medicare Other | Source: Ambulatory Visit | Attending: Cardiology | Admitting: Cardiology

## 2016-10-18 ENCOUNTER — Encounter (HOSPITAL_COMMUNITY): Payer: Self-pay

## 2016-10-18 VITALS — BP 108/60 | HR 72 | Ht 63.0 in | Wt 219.8 lb

## 2016-10-18 DIAGNOSIS — J449 Chronic obstructive pulmonary disease, unspecified: Secondary | ICD-10-CM | POA: Diagnosis not present

## 2016-10-18 DIAGNOSIS — I350 Nonrheumatic aortic (valve) stenosis: Secondary | ICD-10-CM | POA: Diagnosis not present

## 2016-10-18 DIAGNOSIS — Z952 Presence of prosthetic heart valve: Secondary | ICD-10-CM | POA: Diagnosis not present

## 2016-10-18 DIAGNOSIS — E785 Hyperlipidemia, unspecified: Secondary | ICD-10-CM | POA: Diagnosis not present

## 2016-10-18 DIAGNOSIS — E0590 Thyrotoxicosis, unspecified without thyrotoxic crisis or storm: Secondary | ICD-10-CM | POA: Insufficient documentation

## 2016-10-18 DIAGNOSIS — I5032 Chronic diastolic (congestive) heart failure: Secondary | ICD-10-CM | POA: Diagnosis not present

## 2016-10-18 DIAGNOSIS — Z7901 Long term (current) use of anticoagulants: Secondary | ICD-10-CM | POA: Diagnosis not present

## 2016-10-18 DIAGNOSIS — I481 Persistent atrial fibrillation: Secondary | ICD-10-CM | POA: Diagnosis not present

## 2016-10-18 DIAGNOSIS — Z9889 Other specified postprocedural states: Secondary | ICD-10-CM | POA: Diagnosis not present

## 2016-10-18 DIAGNOSIS — Z79899 Other long term (current) drug therapy: Secondary | ICD-10-CM | POA: Diagnosis not present

## 2016-10-18 DIAGNOSIS — I4819 Other persistent atrial fibrillation: Secondary | ICD-10-CM

## 2016-10-18 LAB — CBC
HEMATOCRIT: 37.2 % (ref 36.0–46.0)
Hemoglobin: 11.9 g/dL — ABNORMAL LOW (ref 12.0–15.0)
MCH: 26.6 pg (ref 26.0–34.0)
MCHC: 32 g/dL (ref 30.0–36.0)
MCV: 83 fL (ref 78.0–100.0)
Platelets: 182 10*3/uL (ref 150–400)
RBC: 4.48 MIL/uL (ref 3.87–5.11)
RDW: 14.5 % (ref 11.5–15.5)
WBC: 8.4 10*3/uL (ref 4.0–10.5)

## 2016-10-18 LAB — BASIC METABOLIC PANEL
Anion gap: 11 (ref 5–15)
BUN: 15 mg/dL (ref 6–20)
CO2: 26 mmol/L (ref 22–32)
Calcium: 9 mg/dL (ref 8.9–10.3)
Chloride: 101 mmol/L (ref 101–111)
Creatinine, Ser: 1.01 mg/dL — ABNORMAL HIGH (ref 0.44–1.00)
GFR calc Af Amer: 60 mL/min — ABNORMAL LOW (ref >60)
GFR calc non Af Amer: 52 mL/min — ABNORMAL LOW (ref >60)
GLUCOSE: 115 mg/dL — AB (ref 65–99)
POTASSIUM: 3.8 mmol/L (ref 3.5–5.1)
Sodium: 138 mmol/L (ref 135–145)

## 2016-10-18 LAB — TSH: TSH: 0.848 u[IU]/mL (ref 0.350–4.500)

## 2016-10-18 NOTE — Patient Instructions (Signed)
You have been referred to [facility]. They will call you to schedule appointment.   Routine lab work today. Will notify you of abnormal results   Follow up in 6 months with Dr.McLean

## 2016-10-19 NOTE — Progress Notes (Signed)
Patient ID: Nicole [name], female   DOB: July 06, 1935, 81 y.o.   MRN: [id number] PCP: Nicole [name]) HF Cardiology: Dr. Aundra [name]  Nicole [name] is a 81 yo with history of COPD (58 pack years quit April 2014), atrial fibrillation s/p AVR with pericardial valve, mitral annuloplasty, MAZE procedure and clipping of the LA appendage (02/9322) and diastolic CHF. Daughter is Nicole [name] (CCU nurse).   She went back into atrial fibrillation around 5/16.  Given increased symptoms when in atrial fibrillation, I started her on amiodarone and planned for DCCV after amiodarone load. She went out of atrial fibrillation and into a stable junctional rhythm with rate in the 60s.  She also developed subclinical hyperthyroidism.  I stopped her amiodarone.  She saw an endocrinologist who recommended that she take methimazole.  However, she never started it.  No BRBPR or melena on Xarelto.   She went back into atrial fibrillation again in 1/18.  We decided to give her a trial of DCCV without starting an antiarrhythmic.  DCCV was attempted in 1/18 but failed. She remains in atrial fibrillation today.   Weight is stable.  She is taking Lasix 80 mg bid alternating with Lasix 80 qam/60 qpm.  Breathing is stable, she is short of breath after walking about 100 feet.  She is not on home oxygen.  No chest pain.  No orthopnea/PND.    Labs (9/14): K 4, creatinine 0.9 => 1.04, HCT 33.7, TnI 0.21 (while hospitalized in [name]), BNP 158, TSH normal, AST normal, ALT 39 Labs (03/16/13):  K 3.9 Cr 1.0 Labs (6/16): K 4.3, creatinine 0.99, Hgb 14.6 Labs (1/17): K 4.3, creatinine 1.08 Labs (2/17): K 3.5, creatinine 1.04, BNP 220, free T3 normal, free T4 high, TSH 0.09, LFTs normal Labs (6/17): K 3.7, creatinine 1.04, TSH/free T3/free T4 normal, LDL 54, HDL 54 Labs (1/18): K 3.9, creatinine 1.08, BNP 129, HCT 40.2 Labs (2/18): K 4.5, creatinine 0.86  PMH: 1. COPD: PFTs (5/15) with FEV1 61%, RVC 63%, ratio 96%, DLCO 50%.   2. Atrial  fibrillation: First noted in 7/14.  DCCV in 8/14 to NSR.  Recurred by 9/14.  DCCV to NSR 9/14 but recurred. S/p Maze procedure 10/14.  Recurrent atrial fibrillation around 5/16, later developed a junctional rhythm on amiodarone and amiodarone stopped.  She was noted to be back in atrial fibrillation in 1/18, failed DCCV in 1/18.  3. Hyperlipidemia 4. Diastolic CHF: Echo (5/57) with EF 55-60%, grade II diastolic dysfunction, mild LVH, moderate to severe AS with mean gradient 33 mmHg/peak 49 mmHg and AVA 0.74 cm^2.  Echo (2/17) with EF 60-65%, mild focal basal septal hypertrophy, bioprosthetic aortic valve appeared normal, s/p mitral valve repair with no significant mitral stenosis.  5. Severe AS/Moderate MR. 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 Normal coronaries cath 10/14 7. Ventral hernia.  8. Low back pain: Lumbar disc disease.  9. Hyperthyroidism: Subclinical.  Likely related to amiodarone use.  10. Junctional rhythm.   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  Daughter come to appointments.   FH: No premature CAD.  Breast cancer.   ROS: All systems reviewed and negative except as per HPI.   Current Outpatient Prescriptions  Medication Sig Dispense Refill  . ALPRAZolam (XANAX) 0.25 MG tablet Take 1 tablet by mouth 2 (two) times daily as needed. Reported on 11/06/2015    . atorvastatin (LIPITOR) 40 MG tablet  Take 1 tablet (40 mg total) by mouth daily. 90 tablet 0  . diltiazem (CARDIZEM CD) 240 MG 24 hr capsule Take 1 capsule (240 mg total) by mouth daily. 90 capsule 3  . Fluticasone Furoate-Vilanterol 100-25 MCG/INH AEPB Inhale 1 puff into the lungs daily.    . furosemide (LASIX) 40 MG tablet Take 2 tabs Twice daily every other day ALTERNATING with 2 tabs in AM and 1 tab in PM every other day 120 tablet 3  . ipratropium-albuterol (DUONEB) 0.5-2.5 (3) MG/3ML SOLN Take 3 mLs by nebulization  every 6 (six) hours as needed.    . Multiple Vitamins-Minerals (MULTIVITAMIN WITH MINERALS) tablet Take 1 tablet by mouth daily.    . potassium chloride SA (K-DUR,KLOR-CON) 20 MEQ tablet Take 2 tabs in AM and 1 tab in PM 90 tablet 3  . sertraline (ZOLOFT) 50 MG tablet Take 1 tablet (50 mg total) by mouth daily. 30 tablet 6  . XARELTO 20 MG TABS tablet TAKE 1 TABLET BY MOUTH ONCE DAILY WITH SUPPER NEED (OFFICE VISIT) 30 tablet 11   No current facility-administered medications for this encounter.     Vitals:   10/18/16 1059  BP: 108/60  Pulse: 72  SpO2: 98%  Weight: 219 lb 12 oz (99.7 kg)  Height: 5\' 3"  (1.6 m)   Physical Exam: General: NAD Neck: JVP 7 cm, no thyromegaly or thyroid nodule.  Lungs: Decreased throughout. Clear CV: Irregular S1S2. 1/6 early SEM at RUSB. No carotid bruit.  No edema.  Bilateral lower leg varicosities.  Abdomen: obese. soft, nontender, no hepatosplenomegaly, no distention.  Skin: Intact without lesions or rashes.  Neurologic: Alert and oriented x 3.  Psych: Normal affect. Extremities: No clubbing or cyanosis. No edema.   HEENT: Normal.   Assessment/Plan:  1. Valvular heart disease: Severe aortic stenosis and moderate MR, s/p AVR (tissue) and MV ring with Maze procedure 03/15/13.  - Aortic and mitral valves appeared stable on 2/17 echo.  2. Atrial fibrillation: s/p MAZE. She had been on amiodarone to keep her in NSR.  She went into a regular junctional rhythm on amiodarone.  She also developed hyperthyroidism in the setting of amiodarone use. Amiodarone was stopped. She is now in persistent atrial fibrillation and failed DCCV in 1/18.  Atrial fibrillation seems to trigger CHF, but she has been doing better on higher diuretic doses. - Continue anticoagulation with Xarelto.  She has refused warfarin, and most recent data suggests that DOACs should be ok with bioprosthetic valves.  CBC today.  - Given prior MAZE, she is probably not a good atrial fibrillation  ablation candidate.  - She will stay off amiodarone.  At this point, will continue control/anticoagulation strategy.  3. Chronic diastolic CHF: NYHA II-III symptoms, a lot of her symptoms are likely due to COPD.  She is not volume overloaded on exam. - Continue current Lasix and KCl. Check BMET today.  4. COPD: stable. She no longer is smoking. Sees Dr. Luan Pulling.  5. Hyperthyroidism: Most recent thyroid indices back to normal off amiodarone.  Check TSH today.   Followup in 6 months.    [name] 10/19/2016

## 2016-10-20 ENCOUNTER — Telehealth (HOSPITAL_COMMUNITY): Payer: Self-pay | Admitting: *Deleted

## 2016-10-20 NOTE — Telephone Encounter (Signed)
Order and office note faced to Pulmonary Rehab they will call patient to schedule appt.

## 2016-11-01 NOTE — Addendum Note (Signed)
Addendum  created 11/01/16 1028 by Oleta Mouse, MD   Sign clinical note

## 2016-11-04 ENCOUNTER — Other Ambulatory Visit (HOSPITAL_COMMUNITY): Payer: Self-pay | Admitting: Cardiology

## 2016-11-10 ENCOUNTER — Other Ambulatory Visit (HOSPITAL_COMMUNITY): Payer: Self-pay | Admitting: Cardiology

## 2016-11-10 MED ORDER — FUROSEMIDE 40 MG PO TABS: ORAL_TABLET | ORAL | 3 refills | Status: DC

## 2016-11-17 DIAGNOSIS — J449 Chronic obstructive pulmonary disease, unspecified: Secondary | ICD-10-CM | POA: Diagnosis not present

## 2016-11-17 DIAGNOSIS — I5032 Chronic diastolic (congestive) heart failure: Secondary | ICD-10-CM | POA: Diagnosis not present

## 2016-11-24 DIAGNOSIS — J449 Chronic obstructive pulmonary disease, unspecified: Secondary | ICD-10-CM | POA: Diagnosis not present

## 2016-11-24 DIAGNOSIS — I5032 Chronic diastolic (congestive) heart failure: Secondary | ICD-10-CM | POA: Diagnosis not present

## 2016-11-26 DIAGNOSIS — I5032 Chronic diastolic (congestive) heart failure: Secondary | ICD-10-CM | POA: Diagnosis not present

## 2016-11-26 DIAGNOSIS — J449 Chronic obstructive pulmonary disease, unspecified: Secondary | ICD-10-CM | POA: Diagnosis not present

## 2016-11-29 ENCOUNTER — Other Ambulatory Visit (HOSPITAL_COMMUNITY): Payer: Self-pay | Admitting: Cardiology

## 2016-11-29 DIAGNOSIS — I5032 Chronic diastolic (congestive) heart failure: Secondary | ICD-10-CM | POA: Diagnosis not present

## 2016-11-29 DIAGNOSIS — J449 Chronic obstructive pulmonary disease, unspecified: Secondary | ICD-10-CM | POA: Diagnosis not present

## 2016-12-03 DIAGNOSIS — J449 Chronic obstructive pulmonary disease, unspecified: Secondary | ICD-10-CM | POA: Diagnosis not present

## 2016-12-03 DIAGNOSIS — I5032 Chronic diastolic (congestive) heart failure: Secondary | ICD-10-CM | POA: Diagnosis not present

## 2016-12-05 ENCOUNTER — Other Ambulatory Visit (HOSPITAL_COMMUNITY): Payer: Self-pay | Admitting: Cardiology

## 2016-12-06 DIAGNOSIS — J449 Chronic obstructive pulmonary disease, unspecified: Secondary | ICD-10-CM | POA: Diagnosis not present

## 2016-12-06 DIAGNOSIS — I5032 Chronic diastolic (congestive) heart failure: Secondary | ICD-10-CM | POA: Diagnosis not present

## 2016-12-08 DIAGNOSIS — I5032 Chronic diastolic (congestive) heart failure: Secondary | ICD-10-CM | POA: Diagnosis not present

## 2016-12-08 DIAGNOSIS — J449 Chronic obstructive pulmonary disease, unspecified: Secondary | ICD-10-CM | POA: Diagnosis not present

## 2016-12-10 DIAGNOSIS — I5032 Chronic diastolic (congestive) heart failure: Secondary | ICD-10-CM | POA: Diagnosis not present

## 2016-12-10 DIAGNOSIS — J449 Chronic obstructive pulmonary disease, unspecified: Secondary | ICD-10-CM | POA: Diagnosis not present

## 2016-12-13 DIAGNOSIS — J449 Chronic obstructive pulmonary disease, unspecified: Secondary | ICD-10-CM | POA: Diagnosis not present

## 2016-12-13 DIAGNOSIS — E669 Obesity, unspecified: Secondary | ICD-10-CM | POA: Diagnosis not present

## 2016-12-13 DIAGNOSIS — I4891 Unspecified atrial fibrillation: Secondary | ICD-10-CM | POA: Diagnosis not present

## 2016-12-13 DIAGNOSIS — I5032 Chronic diastolic (congestive) heart failure: Secondary | ICD-10-CM | POA: Diagnosis not present

## 2016-12-13 DIAGNOSIS — F419 Anxiety disorder, unspecified: Secondary | ICD-10-CM | POA: Diagnosis not present

## 2016-12-15 DIAGNOSIS — I5032 Chronic diastolic (congestive) heart failure: Secondary | ICD-10-CM | POA: Diagnosis not present

## 2016-12-15 DIAGNOSIS — J449 Chronic obstructive pulmonary disease, unspecified: Secondary | ICD-10-CM | POA: Diagnosis not present

## 2017-01-26 DIAGNOSIS — M546 Pain in thoracic spine: Secondary | ICD-10-CM | POA: Diagnosis not present

## 2017-02-06 DIAGNOSIS — I509 Heart failure, unspecified: Secondary | ICD-10-CM | POA: Diagnosis not present

## 2017-02-06 DIAGNOSIS — J449 Chronic obstructive pulmonary disease, unspecified: Secondary | ICD-10-CM | POA: Diagnosis not present

## 2017-02-06 DIAGNOSIS — R109 Unspecified abdominal pain: Secondary | ICD-10-CM | POA: Diagnosis not present

## 2017-02-06 DIAGNOSIS — M5489 Other dorsalgia: Secondary | ICD-10-CM | POA: Diagnosis not present

## 2017-02-06 DIAGNOSIS — R52 Pain, unspecified: Secondary | ICD-10-CM | POA: Diagnosis not present

## 2017-02-06 DIAGNOSIS — I4891 Unspecified atrial fibrillation: Secondary | ICD-10-CM | POA: Diagnosis not present

## 2017-02-06 DIAGNOSIS — Z87891 Personal history of nicotine dependence: Secondary | ICD-10-CM | POA: Diagnosis not present

## 2017-02-06 DIAGNOSIS — R1031 Right lower quadrant pain: Secondary | ICD-10-CM | POA: Diagnosis not present

## 2017-02-06 DIAGNOSIS — N83202 Unspecified ovarian cyst, left side: Secondary | ICD-10-CM | POA: Diagnosis not present

## 2017-02-06 DIAGNOSIS — M545 Low back pain: Secondary | ICD-10-CM | POA: Diagnosis not present

## 2017-02-07 DIAGNOSIS — M5134 Other intervertebral disc degeneration, thoracic region: Secondary | ICD-10-CM | POA: Diagnosis not present

## 2017-02-07 DIAGNOSIS — M546 Pain in thoracic spine: Secondary | ICD-10-CM | POA: Diagnosis not present

## 2017-02-07 DIAGNOSIS — M5414 Radiculopathy, thoracic region: Secondary | ICD-10-CM | POA: Diagnosis not present

## 2017-02-09 ENCOUNTER — Emergency Department (HOSPITAL_COMMUNITY): Payer: Medicare Other

## 2017-02-09 ENCOUNTER — Emergency Department (HOSPITAL_COMMUNITY)
Admission: EM | Admit: 2017-02-09 | Discharge: 2017-02-09 | Disposition: A | Discharge status: Home / Self Care | Payer: Medicare Other | Attending: Emergency Medicine | Admitting: Emergency Medicine

## 2017-02-09 ENCOUNTER — Encounter (HOSPITAL_COMMUNITY): Payer: Self-pay | Admitting: Emergency Medicine

## 2017-02-09 DIAGNOSIS — Z79899 Other long term (current) drug therapy: Secondary | ICD-10-CM | POA: Insufficient documentation

## 2017-02-09 DIAGNOSIS — Z87891 Personal history of nicotine dependence: Secondary | ICD-10-CM | POA: Insufficient documentation

## 2017-02-09 DIAGNOSIS — J449 Chronic obstructive pulmonary disease, unspecified: Secondary | ICD-10-CM | POA: Insufficient documentation

## 2017-02-09 DIAGNOSIS — I5032 Chronic diastolic (congestive) heart failure: Secondary | ICD-10-CM | POA: Diagnosis not present

## 2017-02-09 DIAGNOSIS — I2510 Atherosclerotic heart disease of native coronary artery without angina pectoris: Secondary | ICD-10-CM | POA: Insufficient documentation

## 2017-02-09 DIAGNOSIS — Z7901 Long term (current) use of anticoagulants: Secondary | ICD-10-CM | POA: Diagnosis not present

## 2017-02-09 DIAGNOSIS — I517 Cardiomegaly: Secondary | ICD-10-CM | POA: Diagnosis not present

## 2017-02-09 DIAGNOSIS — M545 Low back pain: Secondary | ICD-10-CM | POA: Diagnosis present

## 2017-02-09 DIAGNOSIS — M546 Pain in thoracic spine: Secondary | ICD-10-CM | POA: Insufficient documentation

## 2017-02-09 LAB — CBC WITH DIFFERENTIAL/PLATELET
Basophils Absolute: 0 10*3/uL (ref 0.0–0.1)
Basophils Relative: 0 %
EOS ABS: 0 10*3/uL (ref 0.0–0.7)
Eosinophils Relative: 0 %
HCT: 38.3 % (ref 36.0–46.0)
HEMOGLOBIN: 11.7 g/dL — AB (ref 12.0–15.0)
LYMPHS ABS: 2.2 10*3/uL (ref 0.7–4.0)
LYMPHS PCT: 14 %
MCH: 23.5 pg — AB (ref 26.0–34.0)
MCHC: 30.5 g/dL (ref 30.0–36.0)
MCV: 76.9 fL — AB (ref 78.0–100.0)
MONOS PCT: 11 %
Monocytes Absolute: 1.7 10*3/uL — ABNORMAL HIGH (ref 0.1–1.0)
NEUTROS ABS: 11.1 10*3/uL — AB (ref 1.7–7.7)
Neutrophils Relative %: 75 %
Platelets: 230 10*3/uL (ref 150–400)
RBC: 4.98 MIL/uL (ref 3.87–5.11)
RDW: 16.2 % — ABNORMAL HIGH (ref 11.5–15.5)
WBC: 15 10*3/uL — ABNORMAL HIGH (ref 4.0–10.5)

## 2017-02-09 LAB — URINALYSIS, ROUTINE W REFLEX MICROSCOPIC
BILIRUBIN URINE: NEGATIVE
Glucose, UA: NEGATIVE mg/dL
HGB URINE DIPSTICK: NEGATIVE
Ketones, ur: NEGATIVE mg/dL
Leukocytes, UA: NEGATIVE
Nitrite: NEGATIVE
PH: 7 (ref 5.0–8.0)
Protein, ur: NEGATIVE mg/dL
SPECIFIC GRAVITY, URINE: 1.006 (ref 1.005–1.030)

## 2017-02-09 LAB — COMPREHENSIVE METABOLIC PANEL
ALBUMIN: 4.6 g/dL (ref 3.5–5.0)
ALK PHOS: 83 U/L (ref 38–126)
ALT: 37 U/L (ref 14–54)
ANION GAP: 11 (ref 5–15)
AST: 33 U/L (ref 15–41)
BILIRUBIN TOTAL: 0.8 mg/dL (ref 0.3–1.2)
BUN: 19 mg/dL (ref 6–20)
CALCIUM: 9.6 mg/dL (ref 8.9–10.3)
CO2: 28 mmol/L (ref 22–32)
Chloride: 102 mmol/L (ref 101–111)
Creatinine, Ser: 1 mg/dL (ref 0.44–1.00)
GFR calc Af Amer: >60 mL/min (ref >60)
GFR, EST NON AFRICAN AMERICAN: 52 mL/min — AB (ref >60)
GLUCOSE: 107 mg/dL — AB (ref 65–99)
Potassium: 4 mmol/L (ref 3.5–5.1)
Sodium: 141 mmol/L (ref 135–145)
TOTAL PROTEIN: 7.5 g/dL (ref 6.5–8.1)

## 2017-02-09 LAB — LIPASE, BLOOD: Lipase: 25 U/L (ref 11–51)

## 2017-02-09 MED ORDER — HYDROCODONE-ACETAMINOPHEN 5-325 MG PO TABS: 2.0000 | ORAL_TABLET | ORAL | 0 refills | Status: DC | PRN

## 2017-02-09 MED ORDER — ONDANSETRON HCL 4 MG/2ML IJ SOLN
4.0000 mg | Freq: Once | INTRAMUSCULAR | Status: AC
  Administered 2017-02-09: 4 mg via INTRAVENOUS
  Filled 2017-02-09: qty 2

## 2017-02-09 MED ORDER — HYDROMORPHONE HCL 1 MG/ML IJ SOLN
1.0000 mg | Freq: Once | INTRAMUSCULAR | Status: AC
  Administered 2017-02-09: 1 mg via INTRAVENOUS
  Filled 2017-02-09: qty 1

## 2017-02-09 MED ORDER — LORAZEPAM 1 MG PO TABS: 1.0000 mg | ORAL_TABLET | Freq: Three times a day (TID) | ORAL | 0 refills | Status: DC | PRN

## 2017-02-09 MED ORDER — SODIUM CHLORIDE 0.9 % IV SOLN
INTRAVENOUS | Status: DC
  Administered 2017-02-09: 18:00:00 via INTRAVENOUS

## 2017-02-09 MED ORDER — HYDROCODONE-ACETAMINOPHEN 5-325 MG PO TABS: 1.0000 | ORAL_TABLET | Freq: Four times a day (QID) | ORAL | 0 refills | Status: DC | PRN

## 2017-02-09 MED ORDER — LORAZEPAM 2 MG/ML IJ SOLN
1.0000 mg | Freq: Once | INTRAMUSCULAR | Status: AC
  Administered 2017-02-09: 1 mg via INTRAVENOUS
  Filled 2017-02-09: qty 1

## 2017-02-09 NOTE — ED Triage Notes (Signed)
Pt reports right flank pain for last several weeks. Pt reports has been seen by [facility] ER, Orthopedic surgeon and reports was told had pinch nerve. Pt given toradol and steroid injection. Pt reports continued intense pain. Pt denies urinary frequency or dysuria. Pt denies any known injury.

## 2017-02-09 NOTE — ED Notes (Signed)
Pt is back from MRI, unable to perform MRI due to patient size

## 2017-02-09 NOTE — ED Notes (Signed)
Patient was in extreme pain when I walked into the room.  Patient was bent over the cabinet.   Patient was moaning and could barely walk back to the stretcher.  Advised nurse.

## 2017-02-09 NOTE — ED Provider Notes (Signed)
[facility] DEPT Provider Note   CSN: [id number] Arrival date & time: 02/09/17  1300     History   Chief Complaint Chief Complaint  Patient presents with  . Flank Pain    HPI Nicole [name] is a 81 y.o. female.  Patient with complaint of right posterior thoracic back pain for the past several days to weeks but significantly worse in the past 2 days. Patient brought in by her daughter. Patient with evaluation in the [facility] emergency department with a CT of chest and abdomen to rule out dissection. Which was negative.  No other acute findings. Patient also seen by orthopedics. Orthopedics in order an MR which was scheduled to be done today the patient was in too much pain. The MR was to be done in [place]. Patient without any neuro focal deficits. No fevers. The pain comes in waves and is quite intense and is localized just below the tip of the right shoulder blade. Not made worse or better by anything. Patient has been taking Motrin with limited success. Patient is not on any pain medications.  In addition no history of fall or injury. She reportedly also had plain films of the area without any abnormalities.      Past Medical History:  Diagnosis Date  . Anxiety   . Atrial fibrillation ([place])    with Rapid Ventricular response  . COPD (chronic obstructive pulmonary disease) ([place])   . Diastolic CHF ([place])   . Hyperlipidemia   . SOB (shortness of breath)    conplicated by underlying a fib with RVR,COPD, and CHF    Patient Active Problem List   Diagnosis Date Noted  . Hyperthyroidism 08/05/2015  . Chronic diastolic CHF (congestive heart failure) ([place]) 02/28/2013  . Aortic stenosis 02/28/2013  . CAD (coronary artery disease) 02/11/2013  . Acute on chronic diastolic HF (heart failure) ([place]) 02/11/2013  . Atrial fibrillation ([employer]) 02/11/2013    Past Surgical History:  Procedure Laterality Date  . ABDOMINAL HYSTERECTOMY    . AORTIC VALVE REPLACEMENT N/A 03/05/2013   Procedure: AORTIC VALVE REPLACEMENT (AVR);  Surgeon: Gaye Pollack, MD;  Location: [place];  Service: Open Heart Surgery;  Laterality: N/A;  . BREAST BIOPSY     x3  . CARDIOVERSION N/A 02/13/2013   Procedure: CARDIOVERSION;  Surgeon: [name], MD;  Location[facility] ENDOSCOPY;  Service: Cardiovascular;  Laterality: N/A;  . CARDIOVERSION N/A 03/01/2013   Procedure: CARDIOVERSION;  Surgeon: [name], MD;  Location: [facility] ENDOSCOPY;  Service: Cardiovascular;  Laterality: N/A;  . CARDIOVERSION N/A 06/23/2016   Procedure: CARDIOVERSION;  Surgeon: [name], MD;  Location: [place];  Service: Cardiovascular;  Laterality: N/A;  . CATARACT EXTRACTION    . INTRAOPERATIVE TRANSESOPHAGEAL ECHOCARDIOGRAM N/A 03/05/2013   Procedure: INTRAOPERATIVE TRANSESOPHAGEAL ECHOCARDIOGRAM;  Surgeon: Gaye Pollack, MD;  Location: [facility];  Service: Open Heart Surgery;  Laterality: N/A;  . LEFT AND RIGHT HEART CATHETERIZATION WITH CORONARY ANGIOGRAM N/A 03/02/2013   Procedure: LEFT AND RIGHT HEART CATHETERIZATION WITH CORONARY ANGIOGRAM;  Surgeon: [name], MD;  Location: [facility] CATH LAB;  Service: Cardiovascular;  Laterality: N/A;  . MAZE N/A 03/05/2013   Procedure: MAZE;  Surgeon: Gaye Pollack, MD;  Location: [place];  Service: Open Heart Surgery;  Laterality: N/A;  . MITRAL VALVE REPAIR N/A 03/05/2013   Procedure: MITRAL VALVE REPAIR (MVR);  Surgeon: Gaye Pollack, MD;  Location: [place];  Service: Open Heart Surgery;  Laterality: N/A;  . ROTATOR CUFF REPAIR    .  TEE WITHOUT CARDIOVERSION N/A 03/01/2013   Procedure: TRANSESOPHAGEAL ECHOCARDIOGRAM (TEE);  Surgeon: [name], MD;  Location: [place];  Service: Cardiovascular;  Laterality: N/A;  talked to bev. booking no. is [id number] called trish to verify time  . TONSILLECTOMY      OB History    No data available       Home Medications    Prior to Admission medications   Medication Sig Start Date End Date Taking? Authorizing Provider    ALPRAZolam Duanne Moron) 0.25 MG tablet Take 1-2 tablets by mouth 2 (two) times daily as needed for anxiety. Reported on 11/06/2015 08/28/14  Yes [provider]  atorvastatin (LIPITOR) 40 MG tablet TAKE 1 TABLET BY MOUTH ONCE DAILY 11/29/16  Yes [name], MD  diltiazem (CARDIZEM CD) 240 MG 24 hr capsule Take 1 capsule (240 mg total) by mouth daily. 04/06/16  Yes [name], MD  Fluticasone Furoate-Vilanterol 100-25 MCG/INH AEPB Inhale 1 puff into the lungs daily.   Yes [provider]  furosemide (LASIX) 40 MG tablet Take 2 tabs Twice daily every other day ALTERNATING with 2 tabs in AM and 1 tab in PM every other day Patient taking differently: Take 40-80 mg by mouth See admin instructions. Take 2 tabs Twice daily every other day ALTERNATING with 2 tabs in AM and 1 tab in PM every other day 11/10/16  Yes Bensimhon, Shaune Pascal, MD  ipratropium-albuterol (DUONEB) 0.5-2.5 (3) MG/3ML SOLN Take 3 mLs by nebulization every 6 (six) hours as needed (FOR SHORTNESS OF BREATH-WHEEZING).    Yes [provider]  ketorolac (TORADOL) 10 MG tablet TAKE 1 TABLET BY MOUTH UP TO EVERY 6 HOURS FOR A MAX OF 5 DAYS 01/26/17  Yes [provider]  KLOR-CON M20 20 MEQ tablet TAKE 2 TABLETS BY MOUTH IN THE MORNING & 1 TABLET IN THE EVENING 11/04/16  Yes Bensimhon, Shaune Pascal, MD  Multiple Vitamins-Minerals (MULTIVITAMIN WITH MINERALS) tablet Take 1 tablet by mouth daily.   Yes [provider]  naproxen sodium (ALEVE) 220 MG tablet Take 220 mg by mouth daily as needed (for pain).   Yes [provider]  sertraline (ZOLOFT) 50 MG tablet Take 1 tablet (50 mg total) by mouth daily. 10/08/16  Yes [name], MD  XARELTO 20 MG TABS tablet TAKE 1 TABLET BY MOUTH ONCE DAILY WITH SUPPER NEED (OFFICE VISIT) Patient taking differently: TAKE 1 TABLET BY MOUTH ONCE DAILY WITH SUPPER 08/09/16  Yes [name], MD  furosemide (LASIX) 40 MG tablet TAKE 2 TABS BY MOUTH 2 TIMES A DAY  ALTERNATE WITH 2 TABS IN MORNING AND 1 TAB IN EVENING DOSE CHANGE Patient not taking: Reported on 02/09/2017 12/06/16   Bensimhon, Shaune Pascal, MD  HYDROcodone-acetaminophen (NORCO/VICODIN) 5-325 MG tablet Take 2 tablets by mouth every 4 (four) hours as needed. 02/09/17   Fredia Sorrow, MD  HYDROcodone-acetaminophen (NORCO/VICODIN) 5-325 MG tablet Take 1-2 tablets by mouth every 6 (six) hours as needed. 02/09/17   Fredia Sorrow, MD  LORazepam (ATIVAN) 1 MG tablet Take 1 tablet (1 mg total) by mouth 3 (three) times daily as needed for anxiety. 02/09/17   Fredia Sorrow, MD    Family History Family History  Problem Relation Age of Onset  . Breast cancer Unknown        family history    Social History Social History  Substance Use Topics  . Smoking status: Former Smoker    Packs/day: 1.00    Years: 57.00  Types: Cigarettes    Start date: 06/12/1955    Quit date: 11/28/2012  . Smokeless tobacco: Never Used     Comment: Quit in July 2014  . Alcohol use No     Allergies   Patient has no known allergies.   Review of Systems Review of Systems  Constitutional: Negative for fever.  HENT: Positive for congestion.   Eyes: Negative for visual disturbance.  Respiratory: Negative for shortness of breath.   Cardiovascular: Negative for chest pain.  Gastrointestinal: Negative for abdominal pain.  Genitourinary: Negative for dysuria and hematuria.  Musculoskeletal: Positive for back pain. Negative for neck pain.  Skin: Negative for rash.  Neurological: Negative for dizziness, facial asymmetry, speech difficulty, weakness, numbness and headaches.  Hematological: Does not bruise/bleed easily.  Psychiatric/Behavioral: Negative for confusion.     Physical Exam Updated Vital Signs BP (!) 111/51 (BP Location: Left Arm)   Pulse 85   Temp 98.2 F (36.8 C) (Oral)   Resp 18   Ht 1.6 m (5\' 3" )   Wt 95.3 kg (210 lb)   SpO2 91%   BMI 37.20 kg/m   Physical Exam  Constitutional: She is  oriented to person, place, and time. She appears well-developed and well-nourished. She appears distressed.  HENT:  Head: Normocephalic and atraumatic.  Mouth/Throat: Oropharynx is clear and moist.  Eyes: Pupils are equal, round, and reactive to light. Conjunctivae and EOM are normal.  Neck: Normal range of motion. Neck supple.  Cardiovascular: Normal rate, regular rhythm and normal heart sounds.   Pulmonary/Chest: Effort normal and breath sounds normal. No respiratory distress.  Abdominal: Soft. Bowel sounds are normal. There is no tenderness.  Musculoskeletal: Normal range of motion. She exhibits no tenderness.  No tenderness to palpation to the location of the pain site just below the tip of the right scapula. No appreciable spasm. No rash present.  Neurological: She is alert and oriented to person, place, and time. No cranial nerve deficit or sensory deficit. She exhibits normal muscle tone. Coordination normal.  Skin: Skin is warm. No rash noted.  Nursing note and vitals reviewed.    ED Treatments / Results  Labs (all labs ordered are listed, but only abnormal results are displayed) Labs Reviewed  URINALYSIS, ROUTINE W REFLEX MICROSCOPIC - Abnormal; Notable for the following:       Result Value   Color, Urine STRAW (*)    All other components within normal limits  COMPREHENSIVE METABOLIC PANEL - Abnormal; Notable for the following:    Glucose, Bld 107 (*)    GFR calc non Af Amer 52 (*)    All other components within normal limits  CBC WITH DIFFERENTIAL/PLATELET - Abnormal; Notable for the following:    WBC 15.0 (*)    Hemoglobin 11.7 (*)    MCV 76.9 (*)    MCH 23.5 (*)    RDW 16.2 (*)    Neutro Abs 11.1 (*)    Monocytes Absolute 1.7 (*)    All other components within normal limits  LIPASE, BLOOD    EKG  EKG Interpretation None       Radiology Dg Chest 2 View  Result Date: 02/09/2017 CLINICAL DATA:  Flank pain EXAM: CHEST  2 VIEW COMPARISON:  05/08/2014  FINDINGS: Cardiomegaly, mild vascular congestion. Prior valve replacement. Low lung volumes. No confluent opacities, effusions or overt edema. No acute bony abnormality. IMPRESSION: Low lung volumes.  Cardiomegaly with vascular congestion. Electronically Signed   By: Rolm Baptise M.D.   On:  02/09/2017 20:42    Procedures Procedures (including critical care time)  Medications Ordered in ED Medications  0.9 %  sodium chloride infusion ( Intravenous New Bag/Given 02/09/17 1817)  HYDROmorphone (DILAUDID) injection 1 mg (1 mg Intravenous Given 02/09/17 1818)  ondansetron (ZOFRAN) injection 4 mg (4 mg Intravenous Given 02/09/17 1820)  LORazepam (ATIVAN) injection 1 mg (1 mg Intravenous Given 02/09/17 1931)     Initial Impression / Assessment and Plan / ED Course  I have reviewed the triage vital signs and the nursing notes.  Pertinent labs & imaging results that were available during my care of the patient were reviewed by me and considered in my medical decision making (see chart for details).     Patient with significant improvement to the right sided thoracic posterior back pain that seemed to be like may be muscle spasm with 1 mg of Dilaudid and 1 mg of Ativan. Attempted to do MRI of thoracic and lumbar spine with patients to large for the scanner. Patient was scheduled for an MRI not sure what part of the back in [place] today where they have a more open scanner but she was in too much pain to go there. Will treat with hydrocodone and Ativan at home recommend MRI of thoracic and lumbar spine at [facility]. Symptoms seem to be consistent with a musculoskeletal type back pain. There was some leukocytosis today for chest x-ray without any acute findings. Feel that the elevated white blood cell count probably secondary to de-margination from pain. Patient's urine without any evidence of urinary tract infection or any significant hematuria.  Final Clinical Impressions(s) / ED Diagnoses   Final  diagnoses:  Acute right-sided thoracic back pain    New Prescriptions New Prescriptions   HYDROCODONE-ACETAMINOPHEN (NORCO/VICODIN) 5-325 MG TABLET    Take 2 tablets by mouth every 4 (four) hours as needed.   HYDROCODONE-ACETAMINOPHEN (NORCO/VICODIN) 5-325 MG TABLET    Take 1-2 tablets by mouth every 6 (six) hours as needed.   LORAZEPAM (ATIVAN) 1 MG TABLET    Take 1 tablet (1 mg total) by mouth 3 (three) times daily as needed for anxiety.     Fredia Sorrow, MD 02/09/17 2122

## 2017-02-09 NOTE — Discharge Instructions (Signed)
Take the hydrocodone one or 2 tablets every 6 hours. Prepack provided. Then a follow-up prescription provided. Take the Ativan for the muscle spasm as directed. Attempt to try to get MRI rescheduled at [facility], MRI was not large enough to do MRI of thoracic and lumbar spine. Follow-up with her doctors. Return for any new or worse symptoms.

## 2017-02-10 DIAGNOSIS — M5414 Radiculopathy, thoracic region: Secondary | ICD-10-CM | POA: Diagnosis not present

## 2017-02-17 MED FILL — Hydrocodone-Acetaminophen Tab 5-325 MG: ORAL | Qty: 6 | Status: AC

## 2017-03-05 ENCOUNTER — Other Ambulatory Visit (HOSPITAL_COMMUNITY): Payer: Self-pay | Admitting: Internal Medicine

## 2017-03-10 DIAGNOSIS — M545 Low back pain: Secondary | ICD-10-CM | POA: Diagnosis not present

## 2017-03-10 DIAGNOSIS — M47816 Spondylosis without myelopathy or radiculopathy, lumbar region: Secondary | ICD-10-CM | POA: Diagnosis not present

## 2017-03-10 DIAGNOSIS — S32000A Wedge compression fracture of unspecified lumbar vertebra, initial encounter for closed fracture: Secondary | ICD-10-CM | POA: Diagnosis not present

## 2017-03-10 DIAGNOSIS — G8929 Other chronic pain: Secondary | ICD-10-CM | POA: Diagnosis not present

## 2017-03-16 DIAGNOSIS — I4891 Unspecified atrial fibrillation: Secondary | ICD-10-CM | POA: Diagnosis not present

## 2017-03-16 DIAGNOSIS — I509 Heart failure, unspecified: Secondary | ICD-10-CM | POA: Diagnosis not present

## 2017-03-16 DIAGNOSIS — J449 Chronic obstructive pulmonary disease, unspecified: Secondary | ICD-10-CM | POA: Diagnosis not present

## 2017-03-16 DIAGNOSIS — M545 Low back pain: Secondary | ICD-10-CM | POA: Diagnosis not present

## 2017-04-08 DIAGNOSIS — H35372 Puckering of macula, left eye: Secondary | ICD-10-CM | POA: Diagnosis not present

## 2017-04-08 DIAGNOSIS — H01021 Squamous blepharitis right upper eyelid: Secondary | ICD-10-CM | POA: Diagnosis not present

## 2017-04-08 DIAGNOSIS — H01022 Squamous blepharitis right lower eyelid: Secondary | ICD-10-CM | POA: Diagnosis not present

## 2017-04-08 DIAGNOSIS — H04213 Epiphora due to excess lacrimation, bilateral lacrimal glands: Secondary | ICD-10-CM | POA: Diagnosis not present

## 2017-05-13 ENCOUNTER — Ambulatory Visit (HOSPITAL_COMMUNITY)
Admission: RE | Admit: 2017-05-13 | Discharge: 2017-05-13 | Disposition: A | Discharge status: Home / Self Care | Payer: Medicare Other | Source: Ambulatory Visit | Attending: Cardiology | Admitting: Cardiology

## 2017-05-13 VITALS — Wt 216.0 lb

## 2017-05-13 DIAGNOSIS — Z7901 Long term (current) use of anticoagulants: Secondary | ICD-10-CM | POA: Diagnosis not present

## 2017-05-13 DIAGNOSIS — Z952 Presence of prosthetic heart valve: Secondary | ICD-10-CM | POA: Diagnosis not present

## 2017-05-13 DIAGNOSIS — J449 Chronic obstructive pulmonary disease, unspecified: Secondary | ICD-10-CM | POA: Diagnosis not present

## 2017-05-13 DIAGNOSIS — I350 Nonrheumatic aortic (valve) stenosis: Secondary | ICD-10-CM | POA: Diagnosis not present

## 2017-05-13 DIAGNOSIS — I481 Persistent atrial fibrillation: Secondary | ICD-10-CM

## 2017-05-13 DIAGNOSIS — I5032 Chronic diastolic (congestive) heart failure: Secondary | ICD-10-CM | POA: Insufficient documentation

## 2017-05-13 DIAGNOSIS — Z79899 Other long term (current) drug therapy: Secondary | ICD-10-CM | POA: Insufficient documentation

## 2017-05-13 DIAGNOSIS — E0590 Thyrotoxicosis, unspecified without thyrotoxic crisis or storm: Secondary | ICD-10-CM | POA: Insufficient documentation

## 2017-05-13 DIAGNOSIS — I4819 Other persistent atrial fibrillation: Secondary | ICD-10-CM

## 2017-05-13 DIAGNOSIS — Z9889 Other specified postprocedural states: Secondary | ICD-10-CM | POA: Diagnosis not present

## 2017-05-13 DIAGNOSIS — Z79891 Long term (current) use of opiate analgesic: Secondary | ICD-10-CM | POA: Insufficient documentation

## 2017-05-13 LAB — CBC
HEMATOCRIT: 34 % — AB (ref 36.0–46.0)
Hemoglobin: 10.1 g/dL — ABNORMAL LOW (ref 12.0–15.0)
MCH: 22.5 pg — AB (ref 26.0–34.0)
MCHC: 29.7 g/dL — ABNORMAL LOW (ref 30.0–36.0)
MCV: 75.7 fL — AB (ref 78.0–100.0)
PLATELETS: 237 10*3/uL (ref 150–400)
RBC: 4.49 MIL/uL (ref 3.87–5.11)
RDW: 17.4 % — AB (ref 11.5–15.5)
WBC: 10.7 10*3/uL — AB (ref 4.0–10.5)

## 2017-05-13 LAB — BASIC METABOLIC PANEL
Anion gap: 9 (ref 5–15)
BUN: 6 mg/dL (ref 6–20)
CHLORIDE: 102 mmol/L (ref 101–111)
CO2: 28 mmol/L (ref 22–32)
CREATININE: 0.96 mg/dL (ref 0.44–1.00)
Calcium: 9.4 mg/dL (ref 8.9–10.3)
GFR calc Af Amer: >60 mL/min (ref >60)
GFR calc non Af Amer: 55 mL/min — ABNORMAL LOW (ref >60)
Glucose, Bld: 112 mg/dL — ABNORMAL HIGH (ref 65–99)
POTASSIUM: 3.3 mmol/L — AB (ref 3.5–5.1)
Sodium: 139 mmol/L (ref 135–145)

## 2017-05-13 MED ORDER — METOLAZONE 2.5 MG PO TABS: 2.5000 mg | ORAL_TABLET | Freq: Every day | ORAL | 0 refills | Status: DC

## 2017-05-13 MED ORDER — TORSEMIDE 20 MG PO TABS: 80.0000 mg | ORAL_TABLET | Freq: Every day | ORAL | 3 refills | Status: DC

## 2017-05-13 NOTE — Patient Instructions (Signed)
Stop Furosemide  Start Torsemide 80 mg (4 tabs) daily  Take Metolazone 2.5 mg tomorrow before torsemide  Your physician has requested that you have an echocardiogram. Echocardiography is a painless test that uses sound waves to create images of your heart. It provides your doctor with information about the size and shape of your heart and how well your heart's chambers and valves are working. This procedure takes approximately one hour. There are no restrictions for this procedure.  Your physician recommends that you schedule a follow-up appointment in: on Friday with Dr. Aundra Dubin an a echocardiogram and lab

## 2017-05-15 NOTE — Progress Notes (Signed)
Patient ID: Nicole [name], female   DOB: 23-Jan-1936, 81 y.o.   MRN: [id number] PCP: Nicole [name] [facility]) HF Cardiology: Dr. Aundra Dubin  Nicole [name] is a 81 yo with history of COPD (46 pack years quit April 2014), atrial fibrillation s/p AVR with pericardial valve, mitral annuloplasty, MAZE procedure and clipping of the LA appendage ([id number]) and diastolic CHF. Daughter is Personnel officer ([employer] nurse).   She went back into atrial fibrillation around 5/16.  Given increased symptoms when in atrial fibrillation, I started her on amiodarone and planned for DCCV after amiodarone load. She went out of atrial fibrillation and into a stable junctional rhythm with rate in the 60s.  She also developed subclinical hyperthyroidism.  I stopped her amiodarone.  She saw an endocrinologist who recommended that she take methimazole.  However, she never started it.  No BRBPR or melena on Xarelto.   She went back into atrial fibrillation again in 1/18.  We decided to give her a trial of DCCV without starting an antiarrhythmic.  DCCV was attempted in 1/18 but failed. She remains in atrial fibrillation today.   She returns today for followup of CHF.  Breathing has been worse for about 2 months.  She is short of breath with minimal ambulation.  Using walker.  Sleeps on side because of orthopnea.  Somewhat tachypneic at rest today. No chest pain.    ECG (personally reviewed): atrial fibrillation, nonspecific T wave flattening   Labs (9/14): K 4, creatinine 0.9 => 1.04, HCT 33.7, TnI 0.21 (while hospitalized in [place]), BNP 158, TSH normal, AST normal, ALT 39 Labs (03/16/13):  K 3.9 Cr 1.0 Labs (6/16): K 4.3, creatinine 0.99, Hgb 14.6 Labs (1/17): K 4.3, creatinine 1.08 Labs (2/17): K 3.5, creatinine 1.04, BNP 220, free T3 normal, free T4 high, TSH 0.09, LFTs normal Labs (6/17): K 3.7, creatinine 1.04, TSH/free T3/free T4 normal, LDL 54, HDL 54 Labs (1/18): K 3.9, creatinine 1.08, BNP 129, HCT 40.2 Labs (2/18): K 4.5, creatinine  0.86 Labs (5/18): TSH normal Labs (9/18): K 4, creatinine 1.0  PMH: 1. COPD: PFTs (5/15) with FEV1 61%, RVC 63%, ratio 96%, DLCO 50%.   2. Atrial fibrillation: First noted in 7/14.  DCCV in 8/14 to NSR.  Recurred by 9/14.  DCCV to NSR 9/14 but recurred. S/p Maze procedure 10/14.  Recurrent atrial fibrillation around 5/16, later developed a junctional rhythm on amiodarone and amiodarone stopped.  She was noted to be back in atrial fibrillation in 1/18, failed DCCV in 1/18.  3. Hyperlipidemia 4. Diastolic CHF: Echo (0/09) with EF 55-60%, grade II diastolic dysfunction, mild LVH, moderate to severe AS with mean gradient 33 mmHg/peak 49 mmHg and AVA 0.74 cm^2.  Echo (2/17) with EF 60-65%, mild focal basal septal hypertrophy, bioprosthetic aortic valve appeared normal, s/p mitral valve repair with no significant mitral stenosis.  5. Severe AS/Moderate MR. 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 Normal coronaries cath 10/14 7. Ventral hernia.  8. Low back pain: Lumbar disc disease.  9. Hyperthyroidism: Subclinical.  Likely related to amiodarone use.  10. Junctional rhythm.   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  Lives alone.  Daughter come to appointments.   FH: No premature CAD.  Breast cancer.   ROS: All systems reviewed and negative except as per HPI.   Current Outpatient Medications  Medication Sig Dispense Refill  . ALPRAZolam (XANAX) 0.25 MG tablet Take 1-2 tablets  by mouth 2 (two) times daily as needed for anxiety. Reported on 11/06/2015    . atorvastatin (LIPITOR) 40 MG tablet TAKE 1 TABLET BY MOUTH ONCE DAILY 90 tablet 2  . diltiazem (CARDIZEM CD) 240 MG 24 hr capsule Take 1 capsule (240 mg total) by mouth daily. 90 capsule 3  . Fluticasone Furoate-Vilanterol 100-25 MCG/INH AEPB Inhale 1 puff into the lungs daily.    Marland Kitchen HYDROcodone-acetaminophen (NORCO/VICODIN) 5-325 MG tablet Take 2 tablets by  mouth every 4 (four) hours as needed. 6 tablet 0  . HYDROcodone-acetaminophen (NORCO/VICODIN) 5-325 MG tablet Take 1-2 tablets by mouth every 6 (six) hours as needed. 10 tablet 0  . ipratropium-albuterol (DUONEB) 0.5-2.5 (3) MG/3ML SOLN Take 3 mLs by nebulization every 6 (six) hours as needed (FOR SHORTNESS OF BREATH-WHEEZING).     Marland Kitchen ketorolac (TORADOL) 10 MG tablet TAKE 1 TABLET BY MOUTH UP TO EVERY 6 HOURS FOR A MAX OF 5 DAYS  0  . KLOR-CON M20 20 MEQ tablet TAKE 2 TABLETS BY MOUTH IN THE MORNING & 1 TABLET IN THE EVENING 90 tablet 3  . LORazepam (ATIVAN) 1 MG tablet Take 1 tablet (1 mg total) by mouth 3 (three) times daily as needed for anxiety. 15 tablet 0  . Multiple Vitamins-Minerals (MULTIVITAMIN WITH MINERALS) tablet Take 1 tablet by mouth daily.    . naproxen sodium (ALEVE) 220 MG tablet Take 220 mg by mouth daily as needed (for pain).    Marland Kitchen sertraline (ZOLOFT) 50 MG tablet Take 1 tablet (50 mg total) by mouth daily. 30 tablet 6  . XARELTO 20 MG TABS tablet TAKE 1 TABLET BY MOUTH ONCE DAILY WITH SUPPER NEED (OFFICE VISIT) (Patient taking differently: TAKE 1 TABLET BY MOUTH ONCE DAILY WITH SUPPER) 30 tablet 11  . metolazone (ZAROXOLYN) 2.5 MG tablet Take 1 tablet (2.5 mg total) by mouth daily. Take 1 tab before torsemide tomorrow 3 tablet 0  . torsemide (DEMADEX) 20 MG tablet Take 4 tablets (80 mg total) by mouth daily. 120 tablet 3   No current facility-administered medications for this encounter.     Vitals:   05/13/17 1505  Weight: 216 lb (98 kg)   Physical Exam: General: NAD Neck: JVP 12 cm, no thyromegaly or thyroid nodule.  Lungs: Distant breath sounds bilaterally. CV: Nondisplaced PMI.  Heart irregular S1/S2, no S3/S4, 1/6 SEM RUSB.  1+ edema 1/2 to knees on bilaterally.  No carotid bruit.  Normal pedal pulses.  Abdomen: Soft, nontender, no hepatosplenomegaly, no distention.  Skin: Intact without lesions or rashes.  Neurologic: Alert and oriented x 3.  Psych: Normal  affect. Extremities: No clubbing or cyanosis.  HEENT: Normal.     Assessment/Plan:  1. Valvular heart disease: Severe aortic stenosis and moderate MR, s/p AVR (tissue) and MV ring with Maze procedure 03/15/13.  - Aortic and mitral valves appeared stable on 2/17 echo.  - I will arrange for followup echo.  2. Atrial fibrillation: s/p MAZE. She had been on amiodarone to keep her in NSR.  She went into a regular junctional rhythm on amiodarone.  She also developed hyperthyroidism in the setting of amiodarone use. Amiodarone was stopped. She is now in persistent atrial fibrillation and failed DCCV in 1/18.   - Continue anticoagulation with Xarelto.  She has refused warfarin, and most recent data suggests that DOACs should be ok with bioprosthetic valves.  CBC today.  - Given prior MAZE, she is probably not a good atrial fibrillation ablation candidate.  - She will stay  off amiodarone.  At this point, will continue control/anticoagulation strategy.  3. Chronic diastolic CHF:  NYHA class IIIb symptoms.  She is volume overloaded on exam.  I think her current worsening of dyspnea is due to CHF rather than COPD.  - Stop Lasix and start torsemide 80 mg daily.  I will give her 1 dose of metolazone to take with torsemide tomorrow (no metolazone after tomorrow).  - BMET 1 week.  4. COPD: stable. She no longer is smoking. Sees Dr. Luan Pulling.  5. Hyperthyroidism: Most recent thyroid indices back to normal off amiodarone.   Followup in 1 week.    [name] 05/15/2017

## 2017-05-17 ENCOUNTER — Telehealth (HOSPITAL_COMMUNITY): Payer: Self-pay | Admitting: *Deleted

## 2017-05-17 ENCOUNTER — Telehealth (HOSPITAL_COMMUNITY): Payer: Self-pay

## 2017-05-17 MED ORDER — POTASSIUM CHLORIDE CRYS ER 20 MEQ PO TBCR: 40.0000 meq | EXTENDED_RELEASE_TABLET | Freq: Two times a day (BID) | ORAL | 3 refills | Status: DC

## 2017-05-17 NOTE — Telephone Encounter (Signed)
Left message to call back  

## 2017-05-17 NOTE — Telephone Encounter (Signed)
Decrease torsemide to 60 mg daily and will need a BMET.

## 2017-05-17 NOTE — Telephone Encounter (Signed)
Pts daughter called in stating her mothers weight was down 10lbs in 4-5 days went from 216lbs to 206lbs.  Her last office visit she was switched from lasix to Torsemide. She take Tor 80mg  daily.  She feels a lot better except for some dizziness and nausea that started yesterday. Pts daughter thinks pt may be too dry now and might need to decrease medication.  Her weight was 206lbs yesterday and today. Her next office visit is 12/21.   Message routed to [place] for advice

## 2017-05-17 NOTE — Telephone Encounter (Signed)
Notes recorded by Shirley Muscat, RN on 05/17/2017 at 1:40 PM EST Pt aware of results  ------  Notes recorded by Shirley Muscat, RN on 05/16/2017 at 10:57 AM EST Left VM  ------  Notes recorded by [name], MD on 05/15/2017 at 9:36 PM EST Increase KCl by 20 mEq a day.

## 2017-05-18 ENCOUNTER — Other Ambulatory Visit (HOSPITAL_COMMUNITY): Payer: Self-pay | Admitting: *Deleted

## 2017-05-18 ENCOUNTER — Other Ambulatory Visit (HOSPITAL_COMMUNITY): Payer: Self-pay | Admitting: Cardiology

## 2017-05-18 MED ORDER — TORSEMIDE 20 MG PO TABS: 60.0000 mg | ORAL_TABLET | Freq: Every day | ORAL | 3 refills | Status: DC

## 2017-05-18 NOTE — Telephone Encounter (Signed)
Left VM for pts daughter to call back

## 2017-05-18 NOTE — Telephone Encounter (Signed)
Pts daughter [name] aware and agreeable.

## 2017-05-20 ENCOUNTER — Ambulatory Visit (HOSPITAL_COMMUNITY)
Admission: RE | Admit: 2017-05-20 | Discharge: 2017-05-20 | Disposition: A | Discharge status: Home / Self Care | Payer: Medicare Other | Source: Ambulatory Visit | Attending: Cardiology | Admitting: Cardiology

## 2017-05-20 ENCOUNTER — Ambulatory Visit (HOSPITAL_BASED_OUTPATIENT_CLINIC_OR_DEPARTMENT_OTHER)
Admission: RE | Admit: 2017-05-20 | Discharge: 2017-05-20 | Disposition: A | Discharge status: Home / Self Care | Payer: Medicare Other | Source: Ambulatory Visit | Attending: Cardiology | Admitting: Cardiology

## 2017-05-20 ENCOUNTER — Encounter (HOSPITAL_COMMUNITY): Payer: Self-pay | Admitting: Cardiology

## 2017-05-20 VITALS — BP 132/98 | HR 122 | Wt 210.4 lb

## 2017-05-20 DIAGNOSIS — I481 Persistent atrial fibrillation: Secondary | ICD-10-CM

## 2017-05-20 DIAGNOSIS — Z79899 Other long term (current) drug therapy: Secondary | ICD-10-CM | POA: Insufficient documentation

## 2017-05-20 DIAGNOSIS — I5032 Chronic diastolic (congestive) heart failure: Secondary | ICD-10-CM

## 2017-05-20 DIAGNOSIS — M5136 Other intervertebral disc degeneration, lumbar region: Secondary | ICD-10-CM | POA: Diagnosis not present

## 2017-05-20 DIAGNOSIS — J449 Chronic obstructive pulmonary disease, unspecified: Secondary | ICD-10-CM | POA: Insufficient documentation

## 2017-05-20 DIAGNOSIS — I350 Nonrheumatic aortic (valve) stenosis: Secondary | ICD-10-CM | POA: Diagnosis not present

## 2017-05-20 DIAGNOSIS — E0590 Thyrotoxicosis, unspecified without thyrotoxic crisis or storm: Secondary | ICD-10-CM | POA: Diagnosis not present

## 2017-05-20 DIAGNOSIS — I4819 Other persistent atrial fibrillation: Secondary | ICD-10-CM

## 2017-05-20 DIAGNOSIS — Z87891 Personal history of nicotine dependence: Secondary | ICD-10-CM | POA: Insufficient documentation

## 2017-05-20 DIAGNOSIS — Z9889 Other specified postprocedural states: Secondary | ICD-10-CM | POA: Diagnosis not present

## 2017-05-20 DIAGNOSIS — K439 Ventral hernia without obstruction or gangrene: Secondary | ICD-10-CM | POA: Diagnosis not present

## 2017-05-20 DIAGNOSIS — E785 Hyperlipidemia, unspecified: Secondary | ICD-10-CM | POA: Diagnosis not present

## 2017-05-20 DIAGNOSIS — I4891 Unspecified atrial fibrillation: Secondary | ICD-10-CM | POA: Diagnosis present

## 2017-05-20 DIAGNOSIS — Z7901 Long term (current) use of anticoagulants: Secondary | ICD-10-CM | POA: Insufficient documentation

## 2017-05-20 LAB — BASIC METABOLIC PANEL
Anion gap: 13 (ref 5–15)
BUN: 16 mg/dL (ref 6–20)
CHLORIDE: 93 mmol/L — AB (ref 101–111)
CO2: 30 mmol/L (ref 22–32)
CREATININE: 1.11 mg/dL — AB (ref 0.44–1.00)
Calcium: 9.4 mg/dL (ref 8.9–10.3)
GFR calc Af Amer: 53 mL/min — ABNORMAL LOW (ref >60)
GFR calc non Af Amer: 46 mL/min — ABNORMAL LOW (ref >60)
Glucose, Bld: 138 mg/dL — ABNORMAL HIGH (ref 65–99)
POTASSIUM: 2.9 mmol/L — AB (ref 3.5–5.1)
SODIUM: 136 mmol/L (ref 135–145)

## 2017-05-20 MED ORDER — DILTIAZEM HCL ER COATED BEADS 360 MG PO CP24: 360.0000 mg | ORAL_CAPSULE | Freq: Every day | ORAL | 6 refills | Status: DC

## 2017-05-20 MED ORDER — TORSEMIDE 20 MG PO TABS: ORAL_TABLET | ORAL | 3 refills | Status: DC

## 2017-05-20 NOTE — Progress Notes (Signed)
  Echocardiogram 2D Echocardiogram has been performed.  Jennette Dubin 05/20/2017, 11:01 AM

## 2017-05-20 NOTE — Patient Instructions (Signed)
Change Torsemide to 80 mg (4 tabs) daily every other day ALTERNATING with 60 mg (3 tabs) daily every other day   Increase Diltiazem to 360 mg daily  Lab today  Your physician recommends that you schedule a follow-up appointment in: 2-3 weeks

## 2017-05-22 NOTE — Progress Notes (Signed)
Patient ID: Nicole [name], female   DOB: Oct 28, 1935, 81 y.o.   MRN: [id number] PCP: [name] [facility]) HF Cardiology: Dr. Aundra Dubin  Ms Nicole [name] is a 81 yo with history of COPD (71 pack years quit April 2014), atrial fibrillation s/p AVR with pericardial valve, mitral annuloplasty, MAZE procedure and clipping of the LA appendage (31/4970) and diastolic CHF. Daughter is Personnel officer ([employer] nurse).   She went back into atrial fibrillation around 5/16.  Given increased symptoms when in atrial fibrillation, I started her on amiodarone and planned for DCCV after amiodarone load. She went out of atrial fibrillation and into a stable junctional rhythm with rate in the 60s.  She also developed subclinical hyperthyroidism.  I stopped her amiodarone.  She saw an endocrinologist who recommended that she take methimazole.  However, she never started it.  No BRBPR or melena on Xarelto.   She went back into atrial fibrillation again in 1/18.  We decided to give her a trial of DCCV without starting an antiarrhythmic.  DCCV was attempted in 1/18 but failed. She remains in atrial fibrillation today.   She returns today for followup of CHF.  At last appointment, she was more short of breath and appeared volume overloaded on exam.  I changed her from Lasix to torsemide and gave her 1 dose of metolazone. She diuresed extensively with metolazone.  The day after she took metolazone, she felt nauseated and lightheaded.  I had her decrease torsemide to 60 mg daily.  Today, her weight is down 6 lbs.  She is breathing better than at last appointment.  Still fatigues very easily.  Short of breath after walking about 100 feet or so. No orthopnea/PND.  No chest pain.   Labs (9/14): K 4, creatinine 0.9 => 1.04, HCT 33.7, TnI 0.21 (while hospitalized in [place]), BNP 158, TSH normal, AST normal, ALT 39 Labs (03/16/13):  K 3.9 Cr 1.0 Labs (6/16): K 4.3, creatinine 0.99, Hgb 14.6 Labs (1/17): K 4.3, creatinine 1.08 Labs (2/17): K 3.5, creatinine  1.04, BNP 220, free T3 normal, free T4 high, TSH 0.09, LFTs normal Labs (6/17): K 3.7, creatinine 1.04, TSH/free T3/free T4 normal, LDL 54, HDL 54 Labs (1/18): K 3.9, creatinine 1.08, BNP 129, HCT 40.2 Labs (2/18): K 4.5, creatinine 0.86 Labs (5/18): TSH normal Labs (9/18): K 4, creatinine 1.0 Labs (12/18): K 3.3, creatinine 0.96  PMH: 1. COPD: PFTs (5/15) with FEV1 61%, RVC 63%, ratio 96%, DLCO 50%.   2. Atrial fibrillation: First noted in 7/14.  DCCV in 8/14 to NSR.  Recurred by 9/14.  DCCV to NSR 9/14 but recurred. S/p Maze procedure 10/14.  Recurrent atrial fibrillation around 5/16, later developed a junctional rhythm on amiodarone and amiodarone stopped.  She was noted to be back in atrial fibrillation in 1/18, failed DCCV in 1/18.  3. Hyperlipidemia 4. Diastolic CHF: Echo (2/63) with EF 55-60%, grade II diastolic dysfunction, mild LVH, moderate to severe AS with mean gradient 33 mmHg/peak 49 mmHg and AVA 0.74 cm^2.  Echo (2/17) with EF 60-65%, mild focal basal septal hypertrophy, bioprosthetic aortic valve appeared normal, s/p mitral valve repair with no significant mitral stenosis.  - Echo (12/18): EF 60-65%, moderate RV dilation with mildly decreased systolic function, peak RV-RA gradient 31 mmHg, normal bioprosthetic aortic valve, normal repaired mitral valve.  5. Severe AS/Moderate MR. 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 Normal coronaries cath 10/14 7. Ventral hernia.  8. Low back pain: Lumbar disc disease.  9. Hyperthyroidism: Subclinical.  Likely related to amiodarone use.  10. Junctional rhythm.   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  Lives alone.  Daughter come to appointments.   FH: No premature CAD.  Breast cancer.   ROS: All systems reviewed and negative except as per HPI.   Current Outpatient Medications  Medication Sig Dispense Refill  . ALPRAZolam (XANAX) 0.25 MG tablet  Take 1-2 tablets by mouth 2 (two) times daily as needed for anxiety. Reported on 11/06/2015    . atorvastatin (LIPITOR) 40 MG tablet TAKE 1 TABLET BY MOUTH ONCE DAILY 90 tablet 2  . diltiazem (CARDIZEM CD) 360 MG 24 hr capsule Take 1 capsule (360 mg total) by mouth daily. 30 capsule 6  . Fluticasone Furoate-Vilanterol 100-25 MCG/INH AEPB Inhale 1 puff into the lungs daily.    Marland Kitchen HYDROcodone-acetaminophen (NORCO/VICODIN) 5-325 MG tablet Take 2 tablets by mouth every 4 (four) hours as needed. 6 tablet 0  . HYDROcodone-acetaminophen (NORCO/VICODIN) 5-325 MG tablet Take 1-2 tablets by mouth every 6 (six) hours as needed. 10 tablet 0  . ipratropium-albuterol (DUONEB) 0.5-2.5 (3) MG/3ML SOLN Take 3 mLs by nebulization every 6 (six) hours as needed (FOR SHORTNESS OF BREATH-WHEEZING).     Marland Kitchen ketorolac (TORADOL) 10 MG tablet TAKE 1 TABLET BY MOUTH UP TO EVERY 6 HOURS FOR A MAX OF 5 DAYS  0  . LORazepam (ATIVAN) 1 MG tablet Take 1 tablet (1 mg total) by mouth 3 (three) times daily as needed for anxiety. 15 tablet 0  . metolazone (ZAROXOLYN) 2.5 MG tablet Take 1 tablet (2.5 mg total) by mouth daily. Take 1 tab before torsemide tomorrow 3 tablet 0  . Multiple Vitamins-Minerals (MULTIVITAMIN WITH MINERALS) tablet Take 1 tablet by mouth daily.    . naproxen sodium (ALEVE) 220 MG tablet Take 220 mg by mouth daily as needed (for pain).    . potassium chloride SA (KLOR-CON M20) 20 MEQ tablet Take 2 tablets (40 mEq total) by mouth 2 (two) times daily. 180 tablet 3  . sertraline (ZOLOFT) 50 MG tablet Take 1 tablet (50 mg total) by mouth daily. 30 tablet 6  . torsemide (DEMADEX) 20 MG tablet Take 4 tabs daily every other day ALTERNATING with 3 tabs daily every other day 120 tablet 3  . XARELTO 20 MG TABS tablet TAKE 1 TABLET BY MOUTH ONCE DAILY WITH SUPPER NEED (OFFICE VISIT) (Patient taking differently: TAKE 1 TABLET BY MOUTH ONCE DAILY WITH SUPPER) 30 tablet 11   No current facility-administered medications for this  encounter.     Vitals:   05/20/17 1119  BP: (!) 132/98  Pulse: (!) 122  SpO2: 93%  Weight: 210 lb 6.4 oz (95.4 kg)   Physical Exam: General: NAD Neck: JVP 8-9 cm, no thyromegaly or thyroid nodule.  Lungs: Clear to auscultation bilaterally with normal respiratory effort. CV: Nondisplaced PMI.  Heart regular S1/S2, no S3/S4, 1/6 SEM RUSB.  No peripheral edema.  No carotid bruit.  Normal pedal pulses.  Abdomen: Soft, nontender, no hepatosplenomegaly, no distention.  Skin: Intact without lesions or rashes.  Neurologic: Alert and oriented x 3.  Psych: Normal affect. Extremities: No clubbing or cyanosis.  HEENT: Normal.   Assessment/Plan:  1. Valvular heart disease: Severe aortic stenosis and moderate MR, s/p AVR (tissue) and MV ring with Maze procedure 03/15/13.  - Aortic and mitral valves appeared stable on 12/18 echo (reviewed today).  2. Atrial fibrillation: s/p MAZE. She  had been on amiodarone to keep her in NSR.  She went into a regular junctional rhythm on amiodarone.  She also developed hyperthyroidism in the setting of amiodarone use. Amiodarone was stopped. She is now in persistent atrial fibrillation and failed DCCV in 1/18.   - Continue anticoagulation with Xarelto.  She has refused warfarin, and most recent data suggests that DOACs should be ok with bioprosthetic valves.   - Given prior MAZE, she is probably not a good atrial fibrillation ablation candidate.  - She will stay off amiodarone.  At this point, will continue control/anticoagulation strategy.  - With elevated HR and BP today, increase diltiazem CD to 360 mg daily.  3. Chronic diastolic CHF:  NYHA class III symptoms.  She is volume overloaded on exam but looks better than at last appointment and weight is down 6 lbs.   - Increase torsemide to 80 daily alternating with 60 mg daily. BMET today and in 2 wks.  4. COPD: stable. She no longer is smoking. Sees Dr. Luan Pulling.  5. Hyperthyroidism: Most recent thyroid indices  back to normal off amiodarone.   Followup in 2 wks with NP/PA.    [name] 05/22/2017

## 2017-05-23 ENCOUNTER — Telehealth (HOSPITAL_COMMUNITY): Payer: Self-pay | Admitting: *Deleted

## 2017-05-23 MED ORDER — POTASSIUM CHLORIDE CRYS ER 20 MEQ PO TBCR: 60.0000 meq | EXTENDED_RELEASE_TABLET | Freq: Two times a day (BID) | ORAL | 3 refills | Status: DC

## 2017-05-23 NOTE — Telephone Encounter (Signed)
Result Notes for Basic metabolic panel   Notes recorded by Darron Doom, RN on 05/23/2017 at 9:43 AM EST Called and spoke with patient's daughter, she is aware and will increase potassium to 60 mEq BID. Medication updated. ------  Notes recorded by [name], MD on 05/23/2017 at 12:15 AM EST Increase KCl to 60 mEq bid. Low K may be why she has been feeling worse.

## 2017-05-29 ENCOUNTER — Other Ambulatory Visit (HOSPITAL_COMMUNITY): Payer: Self-pay | Admitting: Cardiology

## 2017-06-02 ENCOUNTER — Other Ambulatory Visit (HOSPITAL_COMMUNITY): Payer: Self-pay

## 2017-06-02 MED ORDER — PREDNISONE 20 MG PO TABS: 40.0000 mg | ORAL_TABLET | Freq: Every day | ORAL | 0 refills | Status: AC

## 2017-06-02 MED ORDER — DOXYCYCLINE HYCLATE 50 MG PO CAPS: 100.0000 mg | ORAL_CAPSULE | Freq: Two times a day (BID) | ORAL | 0 refills | Status: AC

## 2017-06-06 ENCOUNTER — Other Ambulatory Visit (HOSPITAL_COMMUNITY): Payer: Self-pay | Admitting: Respiratory Therapy

## 2017-06-06 DIAGNOSIS — I509 Heart failure, unspecified: Secondary | ICD-10-CM | POA: Diagnosis not present

## 2017-06-06 DIAGNOSIS — I4891 Unspecified atrial fibrillation: Secondary | ICD-10-CM | POA: Diagnosis not present

## 2017-06-06 DIAGNOSIS — E669 Obesity, unspecified: Secondary | ICD-10-CM | POA: Diagnosis not present

## 2017-06-06 DIAGNOSIS — J449 Chronic obstructive pulmonary disease, unspecified: Secondary | ICD-10-CM | POA: Diagnosis not present

## 2017-06-06 DIAGNOSIS — J441 Chronic obstructive pulmonary disease with (acute) exacerbation: Secondary | ICD-10-CM

## 2017-06-10 ENCOUNTER — Encounter (HOSPITAL_COMMUNITY): Payer: Medicare Other

## 2017-06-14 ENCOUNTER — Other Ambulatory Visit (HOSPITAL_COMMUNITY): Payer: Self-pay | Admitting: Cardiology

## 2017-06-17 DIAGNOSIS — E785 Hyperlipidemia, unspecified: Secondary | ICD-10-CM | POA: Diagnosis not present

## 2017-06-17 DIAGNOSIS — J441 Chronic obstructive pulmonary disease with (acute) exacerbation: Secondary | ICD-10-CM | POA: Diagnosis not present

## 2017-06-17 DIAGNOSIS — Z7901 Long term (current) use of anticoagulants: Secondary | ICD-10-CM | POA: Diagnosis not present

## 2017-06-17 DIAGNOSIS — Z952 Presence of prosthetic heart valve: Secondary | ICD-10-CM | POA: Diagnosis not present

## 2017-06-17 DIAGNOSIS — Z87891 Personal history of nicotine dependence: Secondary | ICD-10-CM | POA: Diagnosis not present

## 2017-06-17 DIAGNOSIS — N83299 Other ovarian cyst, unspecified side: Secondary | ICD-10-CM | POA: Diagnosis not present

## 2017-06-17 DIAGNOSIS — R069 Unspecified abnormalities of breathing: Secondary | ICD-10-CM | POA: Diagnosis not present

## 2017-06-17 DIAGNOSIS — I509 Heart failure, unspecified: Secondary | ICD-10-CM | POA: Diagnosis not present

## 2017-06-17 DIAGNOSIS — R0602 Shortness of breath: Secondary | ICD-10-CM | POA: Diagnosis not present

## 2017-06-17 DIAGNOSIS — K439 Ventral hernia without obstruction or gangrene: Secondary | ICD-10-CM | POA: Diagnosis not present

## 2017-06-17 DIAGNOSIS — I4891 Unspecified atrial fibrillation: Secondary | ICD-10-CM | POA: Diagnosis not present

## 2017-06-17 DIAGNOSIS — D500 Iron deficiency anemia secondary to blood loss (chronic): Secondary | ICD-10-CM | POA: Diagnosis not present

## 2017-06-17 DIAGNOSIS — N83292 Other ovarian cyst, left side: Secondary | ICD-10-CM | POA: Diagnosis not present

## 2017-06-21 ENCOUNTER — Ambulatory Visit (HOSPITAL_COMMUNITY): Admission: RE | Admit: 2017-06-21 | Payer: Medicare Other | Source: Ambulatory Visit

## 2017-06-22 DIAGNOSIS — M47816 Spondylosis without myelopathy or radiculopathy, lumbar region: Secondary | ICD-10-CM | POA: Diagnosis not present

## 2017-06-22 DIAGNOSIS — G8929 Other chronic pain: Secondary | ICD-10-CM | POA: Diagnosis not present

## 2017-06-22 DIAGNOSIS — M545 Low back pain: Secondary | ICD-10-CM | POA: Diagnosis not present

## 2017-06-22 DIAGNOSIS — F419 Anxiety disorder, unspecified: Secondary | ICD-10-CM | POA: Diagnosis not present

## 2017-06-22 DIAGNOSIS — D649 Anemia, unspecified: Secondary | ICD-10-CM | POA: Diagnosis not present

## 2017-06-22 DIAGNOSIS — M546 Pain in thoracic spine: Secondary | ICD-10-CM | POA: Diagnosis not present

## 2017-06-22 DIAGNOSIS — M5134 Other intervertebral disc degeneration, thoracic region: Secondary | ICD-10-CM | POA: Diagnosis not present

## 2017-06-23 ENCOUNTER — Encounter (HOSPITAL_COMMUNITY): Payer: Self-pay | Admitting: *Deleted

## 2017-06-23 ENCOUNTER — Other Ambulatory Visit: Payer: Self-pay

## 2017-06-23 ENCOUNTER — Inpatient Hospital Stay (HOSPITAL_COMMUNITY)
Admission: EM | Admit: 2017-06-23 | Discharge: 2017-06-24 | DRG: 812 | Disposition: A | Discharge status: Home / Self Care | Payer: Medicare Other | Attending: Internal Medicine | Admitting: Internal Medicine

## 2017-06-23 DIAGNOSIS — E785 Hyperlipidemia, unspecified: Secondary | ICD-10-CM | POA: Diagnosis present

## 2017-06-23 DIAGNOSIS — D509 Iron deficiency anemia, unspecified: Secondary | ICD-10-CM | POA: Diagnosis present

## 2017-06-23 DIAGNOSIS — K649 Unspecified hemorrhoids: Secondary | ICD-10-CM | POA: Diagnosis not present

## 2017-06-23 DIAGNOSIS — Z79899 Other long term (current) drug therapy: Secondary | ICD-10-CM

## 2017-06-23 DIAGNOSIS — D62 Acute posthemorrhagic anemia: Secondary | ICD-10-CM | POA: Diagnosis not present

## 2017-06-23 DIAGNOSIS — G8929 Other chronic pain: Secondary | ICD-10-CM

## 2017-06-23 DIAGNOSIS — Z90710 Acquired absence of both cervix and uterus: Secondary | ICD-10-CM | POA: Diagnosis not present

## 2017-06-23 DIAGNOSIS — Z7901 Long term (current) use of anticoagulants: Secondary | ICD-10-CM | POA: Diagnosis not present

## 2017-06-23 DIAGNOSIS — Z87891 Personal history of nicotine dependence: Secondary | ICD-10-CM

## 2017-06-23 DIAGNOSIS — D649 Anemia, unspecified: Secondary | ICD-10-CM | POA: Diagnosis not present

## 2017-06-23 DIAGNOSIS — J449 Chronic obstructive pulmonary disease, unspecified: Secondary | ICD-10-CM | POA: Diagnosis present

## 2017-06-23 DIAGNOSIS — K921 Melena: Secondary | ICD-10-CM | POA: Diagnosis not present

## 2017-06-23 DIAGNOSIS — K922 Gastrointestinal hemorrhage, unspecified: Secondary | ICD-10-CM

## 2017-06-23 DIAGNOSIS — D123 Benign neoplasm of transverse colon: Secondary | ICD-10-CM | POA: Diagnosis present

## 2017-06-23 DIAGNOSIS — I2510 Atherosclerotic heart disease of native coronary artery without angina pectoris: Secondary | ICD-10-CM | POA: Diagnosis not present

## 2017-06-23 DIAGNOSIS — Z9849 Cataract extraction status, unspecified eye: Secondary | ICD-10-CM | POA: Diagnosis not present

## 2017-06-23 DIAGNOSIS — Z9889 Other specified postprocedural states: Secondary | ICD-10-CM | POA: Diagnosis not present

## 2017-06-23 DIAGNOSIS — Z952 Presence of prosthetic heart valve: Secondary | ICD-10-CM | POA: Diagnosis not present

## 2017-06-23 DIAGNOSIS — I5032 Chronic diastolic (congestive) heart failure: Secondary | ICD-10-CM | POA: Diagnosis present

## 2017-06-23 DIAGNOSIS — D500 Iron deficiency anemia secondary to blood loss (chronic): Secondary | ICD-10-CM | POA: Diagnosis not present

## 2017-06-23 DIAGNOSIS — F419 Anxiety disorder, unspecified: Secondary | ICD-10-CM | POA: Diagnosis not present

## 2017-06-23 DIAGNOSIS — K439 Ventral hernia without obstruction or gangrene: Secondary | ICD-10-CM | POA: Diagnosis not present

## 2017-06-23 DIAGNOSIS — M545 Low back pain, unspecified: Secondary | ICD-10-CM

## 2017-06-23 DIAGNOSIS — I9581 Postprocedural hypotension: Secondary | ICD-10-CM | POA: Diagnosis not present

## 2017-06-23 DIAGNOSIS — R062 Wheezing: Secondary | ICD-10-CM | POA: Diagnosis not present

## 2017-06-23 DIAGNOSIS — I4891 Unspecified atrial fibrillation: Secondary | ICD-10-CM | POA: Diagnosis not present

## 2017-06-23 DIAGNOSIS — M549 Dorsalgia, unspecified: Secondary | ICD-10-CM

## 2017-06-23 DIAGNOSIS — I5030 Unspecified diastolic (congestive) heart failure: Secondary | ICD-10-CM | POA: Diagnosis not present

## 2017-06-23 DIAGNOSIS — K625 Hemorrhage of anus and rectum: Secondary | ICD-10-CM | POA: Diagnosis present

## 2017-06-23 LAB — COMPREHENSIVE METABOLIC PANEL
ALBUMIN: 3.3 g/dL — AB (ref 3.5–5.0)
ALT: 73 U/L — ABNORMAL HIGH (ref 14–54)
AST: 37 U/L (ref 15–41)
Alkaline Phosphatase: 58 U/L (ref 38–126)
Anion gap: 11 (ref 5–15)
BUN: 22 mg/dL — AB (ref 6–20)
CHLORIDE: 101 mmol/L (ref 101–111)
CO2: 25 mmol/L (ref 22–32)
Calcium: 8.5 mg/dL — ABNORMAL LOW (ref 8.9–10.3)
Creatinine, Ser: 1.11 mg/dL — ABNORMAL HIGH (ref 0.44–1.00)
GFR calc Af Amer: 53 mL/min — ABNORMAL LOW (ref >60)
GFR calc non Af Amer: 46 mL/min — ABNORMAL LOW (ref >60)
GLUCOSE: 146 mg/dL — AB (ref 65–99)
POTASSIUM: 4 mmol/L (ref 3.5–5.1)
SODIUM: 137 mmol/L (ref 135–145)
Total Bilirubin: 0.9 mg/dL (ref 0.3–1.2)
Total Protein: 5.4 g/dL — ABNORMAL LOW (ref 6.5–8.1)

## 2017-06-23 LAB — CBC
HEMATOCRIT: 24.3 % — AB (ref 36.0–46.0)
Hemoglobin: 7 g/dL — ABNORMAL LOW (ref 12.0–15.0)
MCH: 21.8 pg — ABNORMAL LOW (ref 26.0–34.0)
MCHC: 28.8 g/dL — AB (ref 30.0–36.0)
MCV: 75.7 fL — AB (ref 78.0–100.0)
Platelets: 240 10*3/uL (ref 150–400)
RBC: 3.21 MIL/uL — ABNORMAL LOW (ref 3.87–5.11)
RDW: 19 % — AB (ref 11.5–15.5)
WBC: 11.8 10*3/uL — AB (ref 4.0–10.5)

## 2017-06-23 LAB — PREPARE RBC (CROSSMATCH)

## 2017-06-23 MED ORDER — TRAMADOL HCL 50 MG PO TABS
50.0000 mg | ORAL_TABLET | Freq: Four times a day (QID) | ORAL | Status: DC | PRN
  Administered 2017-06-23 – 2017-06-24 (×2): 50 mg via ORAL
  Filled 2017-06-23 (×2): qty 1

## 2017-06-23 MED ORDER — SERTRALINE HCL 50 MG PO TABS
50.0000 mg | ORAL_TABLET | Freq: Every day | ORAL | Status: DC
  Administered 2017-06-23: 50 mg via ORAL
  Filled 2017-06-23 (×2): qty 1

## 2017-06-23 MED ORDER — LIDOCAINE 5 % EX PTCH
1.0000 | MEDICATED_PATCH | CUTANEOUS | Status: DC
  Administered 2017-06-23 – 2017-06-24 (×2): 1 via TRANSDERMAL
  Filled 2017-06-23 (×2): qty 1

## 2017-06-23 MED ORDER — ATORVASTATIN CALCIUM 40 MG PO TABS
40.0000 mg | ORAL_TABLET | Freq: Every day | ORAL | Status: DC
  Administered 2017-06-23: 40 mg via ORAL
  Filled 2017-06-23: qty 1

## 2017-06-23 MED ORDER — PEG 3350-KCL-NA BICARB-NACL 420 G PO SOLR
4000.0000 mL | Freq: Once | ORAL | Status: AC
  Administered 2017-06-23: 4000 mL via ORAL
  Filled 2017-06-23: qty 4000

## 2017-06-23 MED ORDER — IPRATROPIUM-ALBUTEROL 0.5-2.5 (3) MG/3ML IN SOLN: 3.0000 mL | RESPIRATORY_TRACT | Status: DC | PRN

## 2017-06-23 MED ORDER — IPRATROPIUM-ALBUTEROL 0.5-2.5 (3) MG/3ML IN SOLN: 3.0000 mL | Freq: Four times a day (QID) | RESPIRATORY_TRACT | Status: DC | PRN

## 2017-06-23 MED ORDER — FENTANYL CITRATE (PF) 100 MCG/2ML IJ SOLN
50.0000 ug | Freq: Once | INTRAMUSCULAR | Status: AC
  Administered 2017-06-23: 50 ug via INTRAVENOUS
  Filled 2017-06-23: qty 2

## 2017-06-23 MED ORDER — SODIUM CHLORIDE 0.9 % IV BOLUS (SEPSIS)
1000.0000 mL | Freq: Once | INTRAVENOUS | Status: AC
  Administered 2017-06-23: 1000 mL via INTRAVENOUS

## 2017-06-23 MED ORDER — ACETAMINOPHEN 325 MG PO TABS
650.0000 mg | ORAL_TABLET | Freq: Four times a day (QID) | ORAL | Status: DC | PRN
  Administered 2017-06-24: 650 mg via ORAL
  Filled 2017-06-23: qty 2

## 2017-06-23 MED ORDER — FLUTICASONE FUROATE-VILANTEROL 100-25 MCG/INH IN AEPB
1.0000 | INHALATION_SPRAY | Freq: Every day | RESPIRATORY_TRACT | Status: DC
  Administered 2017-06-24: 1 via RESPIRATORY_TRACT
  Filled 2017-06-23: qty 28

## 2017-06-23 MED ORDER — FUROSEMIDE 10 MG/ML IJ SOLN
20.0000 mg | Freq: Once | INTRAMUSCULAR | Status: AC
  Administered 2017-06-23: 20 mg via INTRAVENOUS
  Filled 2017-06-23: qty 2

## 2017-06-23 MED ORDER — PANTOPRAZOLE SODIUM 40 MG IV SOLR
40.0000 mg | Freq: Two times a day (BID) | INTRAVENOUS | Status: DC
  Administered 2017-06-24: 40 mg via INTRAVENOUS
  Filled 2017-06-23 (×2): qty 40

## 2017-06-23 NOTE — Progress Notes (Signed)
Paged on call physician regarding pt NPO status  Pt states she has not eaten in awhile and is hungry  MD stated okay to place clear liquid diet until midnight

## 2017-06-23 NOTE — Consult Note (Signed)
Reason for Consult: Hematochezia Referring Physician: Teaching Service  Baron Sane HPI: This is an 82 year old female with a PMH of Afib s/p AVR, mitral annuloplasty, MAZE procedure with clipping of atrial appendage (88/4166), diastolic CHF, and COPD admitted with complaints of hematochezia.  Her daughter reports that she had two episodes of painless hematochezia and she witnessed one of the bleeding events.  The patient denied any symptoms of abdominal pain with the bleeding.  She was on Xarelto for her intermittent afib.  Per Dr. Claris Gladden notes she was in afib in 09/2014 and then out of afib into a junctional rhythm.  Cardioversion was attempted 05/2016, but it failed.  Her COPD is very severe, but she is not on home oxygen.  In the ER her oxygenation was in the upper 90's on RA.  Past Medical History:  Diagnosis Date  . Anxiety   . Atrial fibrillation ([place])    with Rapid Ventricular response  . COPD (chronic obstructive pulmonary disease) ([place])   . Diastolic CHF ([place])   . Hyperlipidemia   . SOB (shortness of breath)    conplicated by underlying a fib with RVR,COPD, and CHF    Past Surgical History:  Procedure Laterality Date  . ABDOMINAL HYSTERECTOMY    . AORTIC VALVE REPLACEMENT N/A 03/05/2013   Procedure: AORTIC VALVE REPLACEMENT (AVR);  Surgeon: Gaye Pollack, MD;  Location: [place];  Service: Open Heart Surgery;  Laterality: N/A;  . BREAST BIOPSY     x3  . CARDIOVERSION N/A 02/13/2013   Procedure: CARDIOVERSION;  Surgeon: [name], MD;  Location: [facility] ENDOSCOPY;  Service: Cardiovascular;  Laterality: N/A;  . CARDIOVERSION N/A 03/01/2013   Procedure: CARDIOVERSION;  Surgeon: [name], MD;  Location: [facility] ENDOSCOPY;  Service: Cardiovascular;  Laterality: N/A;  . CARDIOVERSION N/A 06/23/2016   Procedure: CARDIOVERSION;  Surgeon: [name], MD;  Location: [place];  Service: Cardiovascular;  Laterality: N/A;  . CATARACT EXTRACTION    . INTRAOPERATIVE TRANSESOPHAGEAL  ECHOCARDIOGRAM N/A 03/05/2013   Procedure: INTRAOPERATIVE TRANSESOPHAGEAL ECHOCARDIOGRAM;  Surgeon: Gaye Pollack, MD;  Location: [facility] OR;  Service: Open Heart Surgery;  Laterality: N/A;  . LEFT AND RIGHT HEART CATHETERIZATION WITH CORONARY ANGIOGRAM N/A 03/02/2013   Procedure: LEFT AND RIGHT HEART CATHETERIZATION WITH CORONARY ANGIOGRAM;  Surgeon: [name], MD;  Location: [facility] CATH LAB;  Service: Cardiovascular;  Laterality: N/A;  . MAZE N/A 03/05/2013   Procedure: MAZE;  Surgeon: Gaye Pollack, MD;  Location: [facility];  Service: Open Heart Surgery;  Laterality: N/A;  . MITRAL VALVE REPAIR N/A 03/05/2013   Procedure: MITRAL VALVE REPAIR (MVR);  Surgeon: Gaye Pollack, MD;  Location: [place];  Service: Open Heart Surgery;  Laterality: N/A;  . ROTATOR CUFF REPAIR    . TEE WITHOUT CARDIOVERSION N/A 03/01/2013   Procedure: TRANSESOPHAGEAL ECHOCARDIOGRAM (TEE);  Surgeon: [name], MD;  Location: [place];  Service: Cardiovascular;  Laterality: N/A;  talked to bev. booking no. is [id number] called trish to verify time  . TONSILLECTOMY      Family History  Problem Relation Age of Onset  . Breast cancer Unknown        family history    Social History:  reports that she quit smoking about 4 years ago. Her smoking use included cigarettes. She started smoking about 62 years ago. She has a 57.00 pack-year smoking history. she has never used smokeless tobacco. She reports that she does not drink alcohol or use drugs.  Allergies: No Known Allergies  Medications:  Scheduled: . atorvastatin  40 mg Oral Daily  . fluticasone furoate-vilanterol  1 puff Inhalation Daily  . lidocaine  1 patch Transdermal Q24H  . polyethylene glycol-electrolytes  4,000 mL Oral Once  . sertraline  50 mg Oral Daily   Continuous:   Results for orders placed or performed during the hospital encounter of 06/23/17 (from the past 24 hour(s))  Comprehensive metabolic panel     Status: Abnormal   Collection Time: 06/23/17  12:35 PM  Result Value Ref Range   Sodium 137 135 - 145 mmol/L   Potassium 4.0 3.5 - 5.1 mmol/L   Chloride 101 101 - 111 mmol/L   CO2 25 22 - 32 mmol/L   Glucose, Bld 146 (H) 65 - 99 mg/dL   BUN 22 (H) 6 - 20 mg/dL   Creatinine, Ser 1.11 (H) 0.44 - 1.00 mg/dL   Calcium 8.5 (L) 8.9 - 10.3 mg/dL   Total Protein 5.4 (L) 6.5 - 8.1 g/dL   Albumin 3.3 (L) 3.5 - 5.0 g/dL   AST 37 15 - 41 U/L   ALT 73 (H) 14 - 54 U/L   Alkaline Phosphatase 58 38 - 126 U/L   Total Bilirubin 0.9 0.3 - 1.2 mg/dL   GFR calc non Af Amer 46 (L) >60 mL/min   GFR calc Af Amer 53 (L) >60 mL/min   Anion gap 11 5 - 15  CBC     Status: Abnormal   Collection Time: 06/23/17 12:35 PM  Result Value Ref Range   WBC 11.8 (H) 4.0 - 10.5 K/uL   RBC 3.21 (L) 3.87 - 5.11 MIL/uL   Hemoglobin 7.0 (L) 12.0 - 15.0 g/dL   HCT 24.3 (L) 36.0 - 46.0 %   MCV 75.7 (L) 78.0 - 100.0 fL   MCH 21.8 (L) 26.0 - 34.0 pg   MCHC 28.8 (L) 30.0 - 36.0 g/dL   RDW 19.0 (H) 11.5 - 15.5 %   Platelets 240 150 - 400 K/uL  Type and screen [place]     Status: None (Preliminary result)   Collection Time: 06/23/17 12:43 PM  Result Value Ref Range   ABO/RH(D) A POS    Antibody Screen NEG    Sample Expiration 06/26/2017    Unit Number [id number]    Blood Component Type RBC LR PHER2    Unit division 00    Status of Unit ISSUED    Transfusion Status OK TO TRANSFUSE    Crossmatch Result Compatible    Unit Number [id number]    Blood Component Type RED CELLS,LR    Unit division 00    Status of Unit ALLOCATED    Transfusion Status OK TO TRANSFUSE    Crossmatch Result Compatible   Prepare RBC     Status: None   Collection Time: 06/23/17  3:49 PM  Result Value Ref Range   Order Confirmation ORDER PROCESSED BY BLOOD BANK      No results found.  ROS:  As stated above in the HPI otherwise negative.  Blood pressure (!) 115/49, pulse 76, temperature 97.8 F (36.6 C), temperature source Oral, resp. rate 18, height 5\' 3"   (1.6 m), weight 93.4 kg (206 lb), SpO2 99 %.    PE: Gen: Uncomfortable secondary to back pain, ashen appearing, Pickwickian body habitus HEENT:  [place]/AT, EOMI Lungs: CTA Bilaterally CV: Irreg irreg ABM: Soft, NTND, +BS, large upper abdominal incisional hernia Ext: No C/C/E  Assessment/Plan: 1) Hematochezia/Anemia. 2)  Afib. 3) Ventral hernia. 4) Diastolic CHF. 5) COPD from a 52 pack year history of smoking.   She requires further evaluation, but her cardiac and pulmonary status are poor.  She appears to be compensated at this time, but I am concerned about her breathing.  She states that she is SOB when her back is hurting.  There is also the issue of a large upper incisional hernia.  I do not have any imaging to know if the transverse colon is in the hernia versus small bowel.  I told the patient and her daughter that if I encounter the hernia with the colonoscopy, I will have to abort the procedure.    Plan: 1) EGD/Colonoscopy tomorrow provided anesthesia clears her for the procedures.  Cyenna Rebello D 06/23/2017, 4:49 PM

## 2017-06-23 NOTE — ED Provider Notes (Signed)
[facility] EMERGENCY DEPARTMENT Provider Note   CSN: [id number] Arrival date & time: 06/23/17  1215     History   Chief Complaint Chief Complaint  Patient presents with  . Rectal Bleeding  . Abnormal Lab    HPI Nicole [name] is a 82 y.o. female.  HPI  The patient is an 82 year old female, she has a known history of recurrent and frequent episodes of back pain which has been described as degenerative back pain, muscle spasms but has not been a surgical problem, she has even seen the pain clinic for this.  Recently she was found to have a drop in her hemoglobin from 11-8 and [place] was diagnosed with COPD exacerbation and anemia but she refused a rectal exam at that time.  She went home, her daughter came over to the house this morning and found that she had a large amount of bright red blood per rectum with a bowel movement.  She has had 2 of these this morning.  She has had some mild abdominal cramping, reportedly she had had a CT scan of the abdomen and pelvis at the outside hospital which did not show any etiologies of her symptoms.  This is per her daughter who is a Marine scientist at the hospital here.  The patient is on Xarelto for atrial fibrillation, she last took it last night, she states that she has been feeling slightly weak and increasingly short of breath but at this time is most complained about her back pain.  Past Medical History:  Diagnosis Date  . Anxiety   . Atrial fibrillation ([place])    with Rapid Ventricular response  . COPD (chronic obstructive pulmonary disease) ([place])   . Diastolic CHF ([place])   . Hyperlipidemia   . SOB (shortness of breath)    conplicated by underlying a fib with RVR,COPD, and CHF    Patient Active Problem List   Diagnosis Date Noted  . Hyperthyroidism 08/05/2015  . Chronic diastolic CHF (congestive heart failure) ([place]) 02/28/2013  . Aortic stenosis 02/28/2013  . CAD (coronary artery  disease) 02/11/2013  . Acute on chronic diastolic HF (heart failure) ([place]) 02/11/2013  . Atrial fibrillation ([place]) 02/11/2013    Past Surgical History:  Procedure Laterality Date  . ABDOMINAL HYSTERECTOMY    . AORTIC VALVE REPLACEMENT N/A 03/05/2013   Procedure: AORTIC VALVE REPLACEMENT (AVR);  Surgeon: Gaye Pollack, MD;  Location: [facility];  Service: Open Heart Surgery;  Laterality: N/A;  . BREAST BIOPSY     x3  . CARDIOVERSION N/A 02/13/2013   Procedure: CARDIOVERSION;  Surgeon: [name], MD;  Location: [facility] ENDOSCOPY;  Service: Cardiovascular;  Laterality: N/A;  . CARDIOVERSION N/A 03/01/2013   Procedure: CARDIOVERSION;  Surgeon: [name], MD;  Location: [facility] ENDOSCOPY;  Service: Cardiovascular;  Laterality: N/A;  . CARDIOVERSION N/A 06/23/2016   Procedure: CARDIOVERSION;  Surgeon: [name], MD;  Location: [facility];  Service: Cardiovascular;  Laterality: N/A;  . CATARACT EXTRACTION    . INTRAOPERATIVE TRANSESOPHAGEAL ECHOCARDIOGRAM N/A 03/05/2013   Procedure: INTRAOPERATIVE TRANSESOPHAGEAL ECHOCARDIOGRAM;  Surgeon: Gaye Pollack, MD;  Location: [facility] OR;  Service: Open Heart Surgery;  Laterality: N/A;  . LEFT AND RIGHT HEART CATHETERIZATION WITH CORONARY ANGIOGRAM N/A 03/02/2013   Procedure: LEFT AND RIGHT HEART CATHETERIZATION WITH CORONARY ANGIOGRAM;  Surgeon: [name], MD;  Location: [facility] CATH LAB;  Service: Cardiovascular;  Laterality: N/A;  . MAZE N/A 03/05/2013  Procedure: MAZE;  Surgeon: Gaye Pollack, MD;  Location: [facility];  Service: Open Heart Surgery;  Laterality: N/A;  . MITRAL VALVE REPAIR N/A 03/05/2013   Procedure: MITRAL VALVE REPAIR (MVR);  Surgeon: Gaye Pollack, MD;  Location: [facility];  Service: Open Heart Surgery;  Laterality: N/A;  . ROTATOR CUFF REPAIR    . TEE WITHOUT CARDIOVERSION N/A 03/01/2013   Procedure: TRANSESOPHAGEAL ECHOCARDIOGRAM (TEE);  Surgeon: [name], MD;  Location: [facility];  Service: Cardiovascular;  Laterality: N/A;   talked to bev. booking no. is [id number] called trish to verify time  . TONSILLECTOMY      OB History    No data available       Home Medications    Prior to Admission medications   Medication Sig Start Date End Date Taking? Authorizing Provider  ALPRAZolam Duanne Moron) 0.25 MG tablet Take 1-2 tablets by mouth 2 (two) times daily as needed for anxiety. Reported on 11/06/2015 08/28/14   [provider]  atorvastatin (LIPITOR) 40 MG tablet TAKE 1 TABLET BY MOUTH ONCE DAILY 11/29/16   [name], MD  diltiazem (CARDIZEM CD) 360 MG 24 hr capsule Take 1 capsule (360 mg total) by mouth daily. 05/20/17   [name], MD  Fluticasone Furoate-Vilanterol 100-25 MCG/INH AEPB Inhale 1 puff into the lungs daily.    [provider]  HYDROcodone-acetaminophen (NORCO/VICODIN) 5-325 MG tablet Take 2 tablets by mouth every 4 (four) hours as needed. 02/09/17   Fredia Sorrow, MD  HYDROcodone-acetaminophen (NORCO/VICODIN) 5-325 MG tablet Take 1-2 tablets by mouth every 6 (six) hours as needed. 02/09/17   Fredia Sorrow, MD  ipratropium-albuterol (DUONEB) 0.5-2.5 (3) MG/3ML SOLN Take 3 mLs by nebulization every 6 (six) hours as needed (FOR SHORTNESS OF BREATH-WHEEZING).     [provider]  ketorolac (TORADOL) 10 MG tablet TAKE 1 TABLET BY MOUTH UP TO EVERY 6 HOURS FOR A MAX OF 5 DAYS 01/26/17   [provider]  LORazepam (ATIVAN) 1 MG tablet Take 1 tablet (1 mg total) by mouth 3 (three) times daily as needed for anxiety. 02/09/17   Fredia Sorrow, MD  metolazone (ZAROXOLYN) 2.5 MG tablet Take 1 tablet (2.5 mg total) by mouth daily. Take 1 tab before torsemide tomorrow 05/13/17 08/11/17  [name], MD  Multiple Vitamins-Minerals (MULTIVITAMIN WITH MINERALS) tablet Take 1 tablet by mouth daily.    [provider]  naproxen sodium (ALEVE) 220 MG tablet Take 220 mg by mouth daily as needed (for pain).    [provider]  potassium chloride SA (KLOR-CON  M20) 20 MEQ tablet Take 3 tablets (60 mEq total) by mouth 2 (two) times daily. 05/23/17   [name], MD  sertraline (ZOLOFT) 50 MG tablet TAKE 1 TABLET BY MOUTH ONCE DAILY 06/15/17   [name], MD  torsemide [facility]) 20 MG tablet Take 4 tabs daily every other day ALTERNATING with 3 tabs daily every other day 05/20/17   [name], MD  XARELTO 20 MG TABS tablet TAKE 1 TABLET BY MOUTH ONCE DAILY WITH SUPPER NEED (OFFICE VISIT) Patient taking differently: TAKE 1 TABLET BY MOUTH ONCE DAILY WITH SUPPER 08/09/16   [name], MD    Family History Family History  Problem Relation Age of Onset  . Breast cancer Unknown        family history    Social History Social History   Tobacco Use  . Smoking status: Former Smoker    Packs/day: 1.00  Years: 57.00    Pack years: 57.00    Types: Cigarettes    Start date: 06/12/1955    Last attempt to quit: 11/28/2012    Years since quitting: 4.5  . Smokeless tobacco: Never Used  . Tobacco comment: Quit in July 2014  Substance Use Topics  . Alcohol use: No    Alcohol/week: 0.0 oz  . Drug use: No     Allergies   Patient has no known allergies.   Review of Systems Review of Systems  All other systems reviewed and are negative.    Physical Exam Updated Vital Signs BP (!) 115/49   Pulse 76   Temp 97.8 F (36.6 C) (Oral)   Resp 18   Ht 5\' 3"  (1.6 m)   Wt 93.4 kg (206 lb)   SpO2 99%   BMI 36.49 kg/m   Physical Exam  Constitutional: She appears well-developed and well-nourished. No distress.  HENT:  Head: Normocephalic and atraumatic.  Mouth/Throat: No oropharyngeal exudate.  Dry mucous membranes  Eyes: Conjunctivae and EOM are normal. Pupils are equal, round, and reactive to light. Right eye exhibits no discharge. Left eye exhibits no discharge. No scleral icterus.  Neck: Normal range of motion. Neck supple. No JVD present. No thyromegaly present.  Cardiovascular: Normal heart sounds and intact distal  pulses. Exam reveals no gallop and no friction rub.  No murmur heard. Atrial fibrillation with rapid rate at 110 bpm, variable between 90 and 115  Pulmonary/Chest: Effort normal. No respiratory distress. She has wheezes. She has no rales.  Mild diffuse expiratory wheezing, no increased work of breathing  Abdominal: Soft. Bowel sounds are normal. She exhibits no distension and no mass. There is no tenderness.  Large ventral wall hernia just above the umbilicus, nontender abdomen otherwise  Genitourinary:  Genitourinary Comments: Deferred  Musculoskeletal: Normal range of motion. She exhibits tenderness. She exhibits no edema.  Patient is able to move all 4 extremities, soft compartments diffusely, supple joints, she does have tenderness over her lower back diffusely  Lymphadenopathy:    She has no cervical adenopathy.  Neurological: She is alert. Coordination normal.  The patient is able to move all 4 extremities with normal strength and sensation.  Normal level of alertness, speech and memory  Skin: Skin is warm and dry. No rash noted. No erythema.  She has some bruising in the left antecubital fossa  Psychiatric: She has a normal mood and affect. Her behavior is normal.  Nursing note and vitals reviewed.    ED Treatments / Results  Labs (all labs ordered are listed, but only abnormal results are displayed) Labs Reviewed  COMPREHENSIVE METABOLIC PANEL - Abnormal; Notable for the following components:      Result Value   Glucose, Bld 146 (*)    BUN 22 (*)    Creatinine, Ser 1.11 (*)    Calcium 8.5 (*)    Total Protein 5.4 (*)    Albumin 3.3 (*)    ALT 73 (*)    GFR calc non Af Amer 46 (*)    GFR calc Af Amer 53 (*)    All other components within normal limits  CBC - Abnormal; Notable for the following components:   WBC 11.8 (*)    RBC 3.21 (*)    Hemoglobin 7.0 (*)    HCT 24.3 (*)    MCV 75.7 (*)    MCH 21.8 (*)    MCHC 28.8 (*)    RDW 19.0 (*)  All other components  within normal limits  POC OCCULT BLOOD, ED  TYPE AND SCREEN  PREPARE RBC (CROSSMATCH)    EKG  EKG Interpretation None       Radiology No results found.  Procedures .Critical Care Performed by: Noemi Chapel, MD Authorized by: Noemi Chapel, MD   Critical care provider statement:    Critical care time (minutes):  35   Critical care time was exclusive of:  Separately billable procedures and treating other patients and teaching time   Critical care was necessary to treat or prevent imminent or life-threatening deterioration of the following conditions: severe anemia - GI bleeding.   Critical care was time spent personally by me on the following activities:  Blood draw for specimens, development of treatment plan with patient or surrogate, discussions with consultants, evaluation of patient's response to treatment, examination of patient, obtaining history from patient or surrogate, ordering and performing treatments and interventions, ordering and review of laboratory studies, ordering and review of radiographic studies, pulse oximetry, re-evaluation of patient's condition and review of old charts   (including critical care time)  Medications Ordered in ED Medications  sodium chloride 0.9 % bolus 1,000 mL (not administered)     Initial Impression / Assessment and Plan / ED Course  I have reviewed the triage vital signs and the nursing notes.  Pertinent labs & imaging results that were available during my care of the patient were reviewed by me and considered in my medical decision making (see chart for details).     Patient has evidence of gastrointestinal bleeding and has had progressive anemia, her hemoglobin today is around 7, this is concerning that she is on a novel oral anticoagulant, she has had continued drop in her hemoglobin and will likely need to be transfused.  I would also consider using [facility] however at this time the patient has a good blood pressure though she is  tachycardic.  She will need to be admitted to the hospitalist, gastroenterology will also be consulted.  The patient has never had a colonoscopy.  The patient is critically ill with severe anemia  BP is currently 110 - pulse of 100 D/w resident who will admit GI paged for consultation Transfusion pending for severe anemia.  Discussed with Dr. Benson [place] of GI, he will provide consultation from his service.  Final Clinical Impressions(s) / ED Diagnoses   Final diagnoses:  Acute GI bleeding  Severe anemia      Noemi Chapel, MD 06/23/17 1620

## 2017-06-23 NOTE — Progress Notes (Signed)
Awaiting bowel prep from main pharmacy

## 2017-06-23 NOTE — ED Triage Notes (Signed)
Pt and family reports pt having ongoing problems with fatigue and weakness, was told hgb was 7. Pt is on xarelto and had bright red rectal bleeding this am. Also had nausea this am, no vomiting. Appears pale at triage.

## 2017-06-23 NOTE — H&P (Signed)
Date: 06/23/2017               Patient Name:  Nicole [name] MRN: [id number]  DOB: [birth year] Age / Sex: 82 y.o., female   PCP: Patient, No Pcp Per         Medical Service: Internal Medicine Teaching Service         Attending Physician: Dr. Aldine Contes, MD    First Contact: Dr. Thomasene Ripple Pager: [phone]  Second Contact: Dr. Ledell Noss Pager: [phone]       After Hours (After 5p/  First Contact Pager: [phone]  weekends / holidays): Second Contact Pager: [phone]   Chief Complaint: Bright red blood in stool, SOB  History of Present Illness:  Nicole [name] is an 82 yo with a PMH of COPD, diastolic HF, A-Fib on Xarelto for anticoagulation, chronic low back pain and anxiety who presents for evaluation after two episodes of bloody bowel movements that occurred on the day of presentation. The history was obtained by the patient and daughter who was bedside. The patient states that she has been experiencing worsening back pain over the past few weeks but in her usual state of health leading up to admission. The morning of presentation she had to strain to have a bowel movement and notice bright red blood in the toilet bowel after the bowel movement passed. States that the stool was medium caliber, formed, and brown with bright red streaks around it. She endorsed some abdominal cramping and nausea during the episode. Denied rectal pain, hematemesis, history of melena, constipation. She has never had a colonoscopy because she "doesn't need one". Later in the day she had a second bloody bowel movement, which she showed her daughter who is a Marine scientist. Her daughter confirmed that there was bright red blood in the toilet bowel which was alarming because the patient had never experienced anything like this before. Patient denied chest pain, lower extremity swelling, orthopnea, PND, palpitations, dysuria, and hematuria.   In the weeks, months leading up to admission the patient has experienced  progressively worsening shortness of breath. The patient was initially evaluated in the ED within the last month for this problem and treated for a COPD exacerbation. The patient was also recently seen by her nurse practitioner for SOB, who noted that her hemoglobin was lower than it had been in the past.  Upon arrival to the ED the patient was afebrile, nontachycardic, normotensive (110s-120s/60-70s), and saturating >96% on room air. The patient's labs showed a creatinine around baseline of 1.0-1.1. Her Hgb was found to be 7.0, decreased from 10.1 in 04/2017 and historical baseline of around 12.0. The patient was ordered 2 units of pRBC's and GI was consulted. IMTS was also called for admission.   Meds:  Current Meds  Medication Sig  . ALPRAZolam (XANAX) 0.25 MG tablet Take 1-2 tablets by mouth 2 (two) times daily as needed for anxiety. Reported on 11/06/2015  . atorvastatin (LIPITOR) 40 MG tablet TAKE 1 TABLET BY MOUTH ONCE DAILY (Patient taking differently: TAKE 40 mg  TABLET BY MOUTH in the evening)  . busPIRone (BUSPAR) 7.5 MG tablet Take 7.5 mg by mouth 2 (two) times daily as needed.  . COLCRYS 0.6 MG tablet Take 0.6 mg by mouth once.  . diltiazem (CARDIZEM CD) 360 MG 24 hr capsule Take 1 capsule (360 mg total) by mouth daily.  Marland Kitchen HYDROcodone-acetaminophen (NORCO/VICODIN) 5-325 MG tablet Take 1-2 tablets by mouth every 6 (six) hours as needed.  Marland Kitchen ipratropium-albuterol (  DUONEB) 0.5-2.5 (3) MG/3ML SOLN Take 3 mLs by nebulization every 6 (six) hours as needed (FOR SHORTNESS OF BREATH-WHEEZING).   . LORazepam (ATIVAN) 1 MG tablet Take 1 tablet (1 mg total) by mouth 3 (three) times daily as needed for anxiety. (Patient taking differently: Take 0.5 mg by mouth 3 (three) times daily as needed for anxiety. )  . Multiple Vitamins-Minerals (MULTIVITAMIN WITH MINERALS) tablet Take 1 tablet by mouth daily.  . naproxen sodium (ALEVE) 220 MG tablet Take 220 mg by mouth daily as needed (for pain).  . potassium  chloride SA (KLOR-CON M20) 20 MEQ tablet Take 3 tablets (60 mEq total) by mouth 2 (two) times daily.  . sertraline (ZOLOFT) 50 MG tablet TAKE 1 TABLET BY MOUTH ONCE DAILY (Patient taking differently: TAKE 50 mg TABLET BY MOUTH ONCE DAILY)  . torsemide (DEMADEX) 20 MG tablet Take 4 tabs daily every other day ALTERNATING with 3 tabs daily every other day (Patient taking differently: Take 80 mg by mouth daily. Take 4 tabs daily every other day ALTERNATING with 3 tabs daily every other day)  . VENTOLIN HFA 108 (90 Base) MCG/ACT inhaler Inhale 2 puffs into the lungs as needed.  [name] 20 MG TABS tablet TAKE 1 TABLET BY MOUTH ONCE DAILY WITH SUPPER NEED (OFFICE VISIT) (Patient taking differently: TAKE 20 mg TABLET BY MOUTH ONCE DAILY WITH SUPPER)   Allergies: Allergies as of 06/23/2017  . (No Known Allergies)   Past Medical History: Past Medical History:  Diagnosis Date  . Anxiety   . Atrial fibrillation ([place])    with Rapid Ventricular response  . COPD (chronic obstructive pulmonary disease) ([place])   . Diastolic CHF ([place])   . Hyperlipidemia   . SOB (shortness of breath)    conplicated by underlying a fib with RVR,COPD, and CHF   Past Surgical History: Past Surgical History:  Procedure Laterality Date  . ABDOMINAL HYSTERECTOMY    . AORTIC VALVE REPLACEMENT N/A 03/05/2013   Procedure: AORTIC VALVE REPLACEMENT (AVR);  Surgeon: Gaye Pollack, MD;  Location: [place];  Service: Open Heart Surgery;  Laterality: N/A;  . BREAST BIOPSY     x3  . CARDIOVERSION N/A 02/13/2013   Procedure: CARDIOVERSION;  Surgeon: [name], MD;  Location: [facility] ENDOSCOPY;  Service: Cardiovascular;  Laterality: N/A;  . CARDIOVERSION N/A 03/01/2013   Procedure: CARDIOVERSION;  Surgeon: [name], MD;  Location: [facility] ENDOSCOPY;  Service: Cardiovascular;  Laterality: N/A;  . CARDIOVERSION N/A 06/23/2016   Procedure: CARDIOVERSION;  Surgeon: [name], MD;  Location: [place];  Service: Cardiovascular;   Laterality: N/A;  . CATARACT EXTRACTION    . INTRAOPERATIVE TRANSESOPHAGEAL ECHOCARDIOGRAM N/A 03/05/2013   Procedure: INTRAOPERATIVE TRANSESOPHAGEAL ECHOCARDIOGRAM;  Surgeon: Gaye Pollack, MD;  [facility];  Service: Open Heart Surgery;  Laterality: N/A;  . LEFT AND RIGHT HEART CATHETERIZATION WITH CORONARY ANGIOGRAM N/A 03/02/2013   Procedure: LEFT AND RIGHT HEART CATHETERIZATION WITH CORONARY ANGIOGRAM;  Surgeon: [name], MD;  Location: [facility] CATH LAB;  Service: Cardiovascular;  Laterality: N/A;  . MAZE N/A 03/05/2013   Procedure: MAZE;  Surgeon: Gaye Pollack, MD;  Location: [place];  Service: Open Heart Surgery;  Laterality: N/A;  . MITRAL VALVE REPAIR N/A 03/05/2013   Procedure: MITRAL VALVE REPAIR (MVR);  Surgeon: Gaye Pollack, MD;  Location: [place];  Service: Open Heart Surgery;  Laterality: N/A;  . ROTATOR CUFF REPAIR    . TEE WITHOUT CARDIOVERSION N/A 03/01/2013   Procedure:  TRANSESOPHAGEAL ECHOCARDIOGRAM (TEE);  Surgeon: [name], MD;  Location: [place];  Service: Cardiovascular;  Laterality: N/A;  talked to bev. booking no. is [phone] called trish to verify time  . TONSILLECTOMY     Family History:  Family History  Problem Relation Age of Onset  . Breast cancer Unknown        family history   Social History:  Social History   Tobacco Use  . Smoking status: Former Smoker    Packs/day: 1.00    Years: 57.00    Pack years: 57.00    Types: Cigarettes    Start date: 06/12/1955    Last attempt to quit: 11/28/2012    Years since quitting: 4.5  . Smokeless tobacco: Never Used  . Tobacco comment: Quit in July 2014  Substance Use Topics  . Alcohol use: No    Alcohol/week: 0.0 oz  . Drug use: No   Review of Systems: A complete ROS was negative except as per HPI.   Physical Exam: Blood pressure 122/65, pulse (!) 48, temperature 97.9 F (36.6 C), temperature source Oral, resp. rate 19, height 5\' 3"  (1.6 m), weight 219 lb 14.4 oz (99.7 kg), SpO2 94  %.  Physical Exam  Constitutional:  Elderly woman sitting uncomfortably in bed in no acute distress  HENT:  Mouth/Throat: Oropharynx is clear and moist. No oropharyngeal exudate.  Cardiovascular: Normal rate and intact distal pulses. Exam reveals no friction rub.  No murmur heard. Respiratory:  Breathing through pursed lips. Speaking in full sentences. NO accessory muscle use or nasal flaring. Bibasilar crackles without wheezing.  GI: Soft. She exhibits no distension. There is no tenderness.  Large ventral hernia in mid chest/epigastric region.  Musculoskeletal: She exhibits no edema (of bilateral lower extremities) or tenderness (of bilateral lower extremities).  Skin: Skin is warm and dry. No rash noted. No erythema.   Assessment & Plan by Problem: Active Problems:   Symptomatic anemia  Marlean Mortell is an 82 yo with a PMH of COPD, diastolic HF, A-Fib on Xarelto for anticoagulation, chronic low back pain and anxiety who presented for evaluation after painless rectal bleeding on the morning of presentation. Upon arrival to the ED the patient was found to have a Hgb of 7 and pRBC transfusion was ordered. She was admitted to the internal m  Symptomatic Microcytic Anemia: Patient's complaints of progressively worsening SOB are consistent with anemia. Although patient's symptoms have been attributed to COPD and diastolic heart failure, there were no signs of volume overload on exam and/or wheezing to suggest these medical problems contributing to current presentation. Patient had microcytic anemia since 01/2017 but no clear sings of bleeding on exam per chart review. Patient does not report history of dark, tarry stools. It's possible that patient has been chronically bleeding from lower GI tract, which would explain her symptoms of progressively worsening SOB and microcytic anemia. Unfortunately patient already got transfusion started in ED so iron deficiency labs wouldn't be useful at this time to  work up microcytic anemia. Patient has never had a colonoscopy before, which is worrisome for GI malignancy as a cause of her bleeding. I'm hopeful that the cause of her bleeding is a result of a diverticular bleed, which has been exacerbated in the setting of Xarelto use. This cause of bleeding is more common and usually self resolving. Either way, the patient will receive two units of pRBC's in the ED and undergo EGD/colonscopy per GI recommendations tomorrow. Will hold home anticoagulation, trend CBC  after transfusion, and monitor for clinical signs of bleeding.  -GI consulted and appreciated -POC occult blood in ED pending -NPO at midnight pending EGD and Colonoscopy tomorrow -CBC in AM  A-Fib and Hx of diastolic heart failure: Patient does have bibasilar crackles, but no other evidence of volume overload on exam. Patient denies symptoms of orthopnea, PND. Patient's rate currently <100, but intermittently hypotensive in ED. Will hold home diltiazem until after transfusion in this setting.  -Hold Xarelto in setting of acute lower GI bleed -Holding diltiazem 360 mg -Continuous cardiorespiratory monitoring  COPD: Patient appears comfortable on exam. No wheezing, cough, or hypoxia to suggest acute exacerbation. Will continue to monitor -DuoNebs q3 hours PRN -Home Breo Ellipta daily  Chronic Back Pain: Patient complaining of chronic lower back pain which she states is a result of bilateral muscle spasms and not relieved with anything but Xanax. She states that lidocaine patches do help a little. Will provide these and continue to monitor. Reluctant to provide strong narcotic pain medications in elderly patient at this time, especially in setting of intermittent hypotension.  -Lidocaine patch -Tylenol and Tramadol PRN for pain  FEN/GI: -NPO at midnight pending colonoscopy/EGD tomorrow -No IVF, replace electrolytes as needed -Protonix  VTE Prophylaxis: SCD's while in bed, ambulate as  tolerated Code Status: Full  Dispo: Admit patient to Inpatient with expected length of stay greater than 2 midnights.  SignedThomasene Ripple, MD 06/23/2017, 6:28 PM  Pager: [phone]

## 2017-06-23 NOTE — H&P (View-Only) (Signed)
Reason for Consult: Hematochezia Referring Physician: Teaching Service  Baron Sane HPI: This is an 82 year old female with a PMH of Afib s/p AVR, mitral annuloplasty, MAZE procedure with clipping of atrial appendage (12/1446), diastolic CHF, and COPD admitted with complaints of hematochezia.  Her daughter reports that she had two episodes of painless hematochezia and she witnessed one of the bleeding events.  The patient denied any symptoms of abdominal pain with the bleeding.  She was on Xarelto for her intermittent afib.  Per Dr. Claris Gladden notes she was in afib in 09/2014 and then out of afib into a junctional rhythm.  Cardioversion was attempted 05/2016, but it failed.  Her COPD is very severe, but she is not on home oxygen.  In the ER her oxygenation was in the upper 90's on RA.  Past Medical History:  Diagnosis Date  . Anxiety   . Atrial fibrillation ([place])    with Rapid Ventricular response  . COPD (chronic obstructive pulmonary disease) ([place])   . Diastolic CHF ([place])   . Hyperlipidemia   . SOB (shortness of breath)    conplicated by underlying a fib with RVR,COPD, and CHF    Past Surgical History:  Procedure Laterality Date  . ABDOMINAL HYSTERECTOMY    . AORTIC VALVE REPLACEMENT N/A 03/05/2013   Procedure: AORTIC VALVE REPLACEMENT (AVR);  Surgeon: Gaye Pollack, MD;  Location: [place];  Service: Open Heart Surgery;  Laterality: N/A;  . BREAST BIOPSY     x3  . CARDIOVERSION N/A 02/13/2013   Procedure: CARDIOVERSION;  Surgeon: [name], MD;  Location: [facility] ENDOSCOPY;  Service: Cardiovascular;  Laterality: N/A;  . CARDIOVERSION N/A 03/01/2013   Procedure: CARDIOVERSION;  Surgeon: [name], MD;  Location: [facility] ENDOSCOPY;  Service: Cardiovascular;  Laterality: N/A;  . CARDIOVERSION N/A 06/23/2016   Procedure: CARDIOVERSION;  Surgeon: [name], MD;  Location: [place];  Service: Cardiovascular;  Laterality: N/A;  . CATARACT EXTRACTION    . INTRAOPERATIVE TRANSESOPHAGEAL  ECHOCARDIOGRAM N/A 03/05/2013   Procedure: INTRAOPERATIVE TRANSESOPHAGEAL ECHOCARDIOGRAM;  Surgeon: Gaye Pollack, MD;  Location: [facility] OR;  Service: Open Heart Surgery;  Laterality: N/A;  . LEFT AND RIGHT HEART CATHETERIZATION WITH CORONARY ANGIOGRAM N/A 03/02/2013   Procedure: LEFT AND RIGHT HEART CATHETERIZATION WITH CORONARY ANGIOGRAM;  Surgeon: [name], MD;  Location: [facility] CATH LAB;  Service: Cardiovascular;  Laterality: N/A;  . MAZE N/A 03/05/2013   Procedure: MAZE;  Surgeon: Gaye Pollack, MD;  Location: [place];  Service: Open Heart Surgery;  Laterality: N/A;  . MITRAL VALVE REPAIR N/A 03/05/2013   Procedure: MITRAL VALVE REPAIR (MVR);  Surgeon: Gaye Pollack, MD;  Location: [place];  Service: Open Heart Surgery;  Laterality: N/A;  . ROTATOR CUFF REPAIR    . TEE WITHOUT CARDIOVERSION N/A 03/01/2013   Procedure: TRANSESOPHAGEAL ECHOCARDIOGRAM (TEE);  Surgeon: [name], MD;  Location: [place];  Service: Cardiovascular;  Laterality: N/A;  talked to bev. booking no. is [phone] called trish to verify time  . TONSILLECTOMY      Family History  Problem Relation Age of Onset  . Breast cancer Unknown        family history    Social History:  reports that she quit smoking about 4 years ago. Her smoking use included cigarettes. She started smoking about 62 years ago. She has a 57.00 pack-year smoking history. she has never used smokeless tobacco. She reports that she does not drink alcohol or use drugs.  Allergies: No Known Allergies  Medications:  Scheduled: . atorvastatin  40 mg Oral Daily  . fluticasone furoate-vilanterol  1 puff Inhalation Daily  . lidocaine  1 patch Transdermal Q24H  . polyethylene glycol-electrolytes  4,000 mL Oral Once  . sertraline  50 mg Oral Daily   Continuous:   Results for orders placed or performed during the hospital encounter of 06/23/17 (from the past 24 hour(s))  Comprehensive metabolic panel     Status: Abnormal   Collection Time: 06/23/17  12:35 PM  Result Value Ref Range   Sodium 137 135 - 145 mmol/L   Potassium 4.0 3.5 - 5.1 mmol/L   Chloride 101 101 - 111 mmol/L   CO2 25 22 - 32 mmol/L   Glucose, Bld 146 (H) 65 - 99 mg/dL   BUN 22 (H) 6 - 20 mg/dL   Creatinine, Ser 1.11 (H) 0.44 - 1.00 mg/dL   Calcium 8.5 (L) 8.9 - 10.3 mg/dL   Total Protein 5.4 (L) 6.5 - 8.1 g/dL   Albumin 3.3 (L) 3.5 - 5.0 g/dL   AST 37 15 - 41 U/L   ALT 73 (H) 14 - 54 U/L   Alkaline Phosphatase 58 38 - 126 U/L   Total Bilirubin 0.9 0.3 - 1.2 mg/dL   GFR calc non Af Amer 46 (L) >60 mL/min   GFR calc Af Amer 53 (L) >60 mL/min   Anion gap 11 5 - 15  CBC     Status: Abnormal   Collection Time: 06/23/17 12:35 PM  Result Value Ref Range   WBC 11.8 (H) 4.0 - 10.5 K/uL   RBC 3.21 (L) 3.87 - 5.11 MIL/uL   Hemoglobin 7.0 (L) 12.0 - 15.0 g/dL   HCT 24.3 (L) 36.0 - 46.0 %   MCV 75.7 (L) 78.0 - 100.0 fL   MCH 21.8 (L) 26.0 - 34.0 pg   MCHC 28.8 (L) 30.0 - 36.0 g/dL   RDW 19.0 (H) 11.5 - 15.5 %   Platelets 240 150 - 400 K/uL  Type and screen [place]     Status: None (Preliminary result)   Collection Time: 06/23/17 12:43 PM  Result Value Ref Range   ABO/RH(D) A POS    Antibody Screen NEG    Sample Expiration 06/26/2017    Unit Number [id number]    Blood Component Type RBC LR PHER2    Unit division 00    Status of Unit ISSUED    Transfusion Status OK TO TRANSFUSE    Crossmatch Result Compatible    Unit Number [id number]    Blood Component Type RED CELLS,LR    Unit division 00    Status of Unit ALLOCATED    Transfusion Status OK TO TRANSFUSE    Crossmatch Result Compatible   Prepare RBC     Status: None   Collection Time: 06/23/17  3:49 PM  Result Value Ref Range   Order Confirmation ORDER PROCESSED BY BLOOD BANK      No results found.  ROS:  As stated above in the HPI otherwise negative.  Blood pressure (!) 115/49, pulse 76, temperature 97.8 F (36.6 C), temperature source Oral, resp. rate 18, height 5\' 3"   (1.6 m), weight 93.4 kg (206 lb), SpO2 99 %.    PE: Gen: Uncomfortable secondary to back pain, ashen appearing, Pickwickian body habitus HEENT:  [place]/AT, EOMI Lungs: CTA Bilaterally CV: Irreg irreg ABM: Soft, NTND, +BS, large upper abdominal incisional hernia Ext: No C/C/E  Assessment/Plan: 1) Hematochezia/Anemia. 2)  Afib. 3) Ventral hernia. 4) Diastolic CHF. 5) COPD from a 52 pack year history of smoking.   She requires further evaluation, but her cardiac and pulmonary status are poor.  She appears to be compensated at this time, but I am concerned about her breathing.  She states that she is SOB when her back is hurting.  There is also the issue of a large upper incisional hernia.  I do not have any imaging to know if the transverse colon is in the hernia versus small bowel.  I told the patient and her daughter that if I encounter the hernia with the colonoscopy, I will have to abort the procedure.    Plan: 1) EGD/Colonoscopy tomorrow provided anesthesia clears her for the procedures.  Jovon Streetman D 06/23/2017, 4:49 PM

## 2017-06-23 NOTE — ED Notes (Signed)
Report given to accepting receiving RN on 3E30

## 2017-06-24 ENCOUNTER — Inpatient Hospital Stay (HOSPITAL_COMMUNITY): Payer: Medicare Other | Admitting: Anesthesiology

## 2017-06-24 ENCOUNTER — Encounter (HOSPITAL_COMMUNITY)
Admission: EM | Disposition: A | Discharge status: Home / Self Care | Payer: Self-pay | Source: Home / Self Care | Attending: Internal Medicine

## 2017-06-24 ENCOUNTER — Encounter (HOSPITAL_COMMUNITY): Payer: Self-pay | Admitting: *Deleted

## 2017-06-24 DIAGNOSIS — I4891 Unspecified atrial fibrillation: Secondary | ICD-10-CM

## 2017-06-24 DIAGNOSIS — J449 Chronic obstructive pulmonary disease, unspecified: Secondary | ICD-10-CM | POA: Diagnosis not present

## 2017-06-24 DIAGNOSIS — I5030 Unspecified diastolic (congestive) heart failure: Secondary | ICD-10-CM | POA: Diagnosis not present

## 2017-06-24 DIAGNOSIS — D500 Iron deficiency anemia secondary to blood loss (chronic): Secondary | ICD-10-CM | POA: Diagnosis not present

## 2017-06-24 DIAGNOSIS — D62 Acute posthemorrhagic anemia: Secondary | ICD-10-CM | POA: Diagnosis not present

## 2017-06-24 DIAGNOSIS — Z9849 Cataract extraction status, unspecified eye: Secondary | ICD-10-CM | POA: Diagnosis not present

## 2017-06-24 DIAGNOSIS — Z7901 Long term (current) use of anticoagulants: Secondary | ICD-10-CM | POA: Diagnosis not present

## 2017-06-24 DIAGNOSIS — Z9889 Other specified postprocedural states: Secondary | ICD-10-CM

## 2017-06-24 DIAGNOSIS — F419 Anxiety disorder, unspecified: Secondary | ICD-10-CM | POA: Diagnosis not present

## 2017-06-24 DIAGNOSIS — Z79899 Other long term (current) drug therapy: Secondary | ICD-10-CM | POA: Diagnosis not present

## 2017-06-24 DIAGNOSIS — M545 Low back pain: Secondary | ICD-10-CM

## 2017-06-24 DIAGNOSIS — Z87891 Personal history of nicotine dependence: Secondary | ICD-10-CM | POA: Diagnosis not present

## 2017-06-24 DIAGNOSIS — G8929 Other chronic pain: Secondary | ICD-10-CM

## 2017-06-24 DIAGNOSIS — K649 Unspecified hemorrhoids: Secondary | ICD-10-CM

## 2017-06-24 DIAGNOSIS — D123 Benign neoplasm of transverse colon: Secondary | ICD-10-CM | POA: Diagnosis not present

## 2017-06-24 DIAGNOSIS — I2510 Atherosclerotic heart disease of native coronary artery without angina pectoris: Secondary | ICD-10-CM | POA: Diagnosis not present

## 2017-06-24 DIAGNOSIS — M549 Dorsalgia, unspecified: Secondary | ICD-10-CM

## 2017-06-24 DIAGNOSIS — Z952 Presence of prosthetic heart valve: Secondary | ICD-10-CM | POA: Diagnosis not present

## 2017-06-24 DIAGNOSIS — Z90710 Acquired absence of both cervix and uterus: Secondary | ICD-10-CM | POA: Diagnosis not present

## 2017-06-24 DIAGNOSIS — D509 Iron deficiency anemia, unspecified: Secondary | ICD-10-CM | POA: Diagnosis not present

## 2017-06-24 DIAGNOSIS — K921 Melena: Secondary | ICD-10-CM | POA: Diagnosis not present

## 2017-06-24 DIAGNOSIS — I5032 Chronic diastolic (congestive) heart failure: Secondary | ICD-10-CM | POA: Diagnosis not present

## 2017-06-24 DIAGNOSIS — K625 Hemorrhage of anus and rectum: Secondary | ICD-10-CM | POA: Diagnosis not present

## 2017-06-24 HISTORY — PX: ESOPHAGOGASTRODUODENOSCOPY: SHX5428

## 2017-06-24 HISTORY — PX: COLONOSCOPY WITH PROPOFOL: SHX5780

## 2017-06-24 LAB — BPAM RBC
BLOOD PRODUCT EXPIRATION DATE: 201902072359
Blood Product Expiration Date: 201901312359
ISSUE DATE / TIME: 201901242253
ISSUE DATE / TIME: 201901241638
UNIT TYPE AND RH: 6200
UNIT TYPE AND RH: 6200

## 2017-06-24 LAB — TYPE AND SCREEN
ABO/RH(D): A POS
Antibody Screen: NEGATIVE
Unit division: 0
Unit division: 0

## 2017-06-24 LAB — BASIC METABOLIC PANEL
Anion gap: 10 (ref 5–15)
BUN: 14 mg/dL (ref 6–20)
CALCIUM: 8.5 mg/dL — AB (ref 8.9–10.3)
CHLORIDE: 102 mmol/L (ref 101–111)
CO2: 26 mmol/L (ref 22–32)
CREATININE: 0.92 mg/dL (ref 0.44–1.00)
GFR, EST NON AFRICAN AMERICAN: 57 mL/min — AB (ref >60)
Glucose, Bld: 89 mg/dL (ref 65–99)
Potassium: 4.2 mmol/L (ref 3.5–5.1)
SODIUM: 138 mmol/L (ref 135–145)

## 2017-06-24 LAB — HEMOGLOBIN AND HEMATOCRIT, BLOOD
HEMATOCRIT: 29.6 % — AB (ref 36.0–46.0)
Hemoglobin: 8.9 g/dL — ABNORMAL LOW (ref 12.0–15.0)

## 2017-06-24 SURGERY — COLONOSCOPY WITH PROPOFOL
Anesthesia: Monitor Anesthesia Care

## 2017-06-24 MED ORDER — DULOXETINE HCL 20 MG PO CPEP: 20.0000 mg | ORAL_CAPSULE | Freq: Every day | ORAL | 0 refills | Status: AC

## 2017-06-24 MED ORDER — SODIUM CHLORIDE 0.9 % IV SOLN
INTRAVENOUS | Status: DC
  Administered 2017-06-24: 09:00:00 via INTRAVENOUS

## 2017-06-24 MED ORDER — DEXMEDETOMIDINE HCL 200 MCG/2ML IV SOLN
INTRAVENOUS | Status: DC | PRN
  Administered 2017-06-24 (×6): 4 ug via INTRAVENOUS

## 2017-06-24 MED ORDER — DIPHENHYDRAMINE HCL 50 MG/ML IJ SOLN
INTRAMUSCULAR | Status: AC
  Filled 2017-06-24: qty 1

## 2017-06-24 MED ORDER — LIDOCAINE HCL (CARDIAC) 20 MG/ML IV SOLN
INTRAVENOUS | Status: DC | PRN
  Administered 2017-06-24: 50 mg via INTRATRACHEAL

## 2017-06-24 MED ORDER — PROPOFOL 500 MG/50ML IV EMUL
INTRAVENOUS | Status: DC | PRN
  Administered 2017-06-24: 75 ug/kg/min via INTRAVENOUS

## 2017-06-24 MED ORDER — PHENYLEPHRINE HCL 10 MG/ML IJ SOLN
INTRAMUSCULAR | Status: DC | PRN
  Administered 2017-06-24 (×2): 80 ug via INTRAVENOUS

## 2017-06-24 MED ORDER — DIPHENHYDRAMINE HCL 50 MG/ML IJ SOLN
INTRAMUSCULAR | Status: DC | PRN
  Administered 2017-06-24: 12.5 mg via INTRAVENOUS

## 2017-06-24 SURGICAL SUPPLY — 21 items

## 2017-06-24 NOTE — Interval H&P Note (Signed)
History and Physical Interval Note:  06/24/2017 8:57 AM  Nicole [name]  has presented today for surgery, with the diagnosis of Hematochezia and anemia  The various methods of treatment have been discussed with the patient and family. After consideration of risks, benefits and other options for treatment, the patient has consented to  Procedure(s): COLONOSCOPY WITH PROPOFOL (N/A) ESOPHAGOGASTRODUODENOSCOPY (EGD) (N/A) as a surgical intervention .  The patient's history has been reviewed, patient examined, no change in status, stable for surgery.  I have reviewed the patient's chart and labs.  Questions were answered to the patient's satisfaction.     Dorinda Stehr D

## 2017-06-24 NOTE — Discharge Summary (Signed)
Name: Nicole [name] MRN: [id number] DOB: May 28, 1936 82 y.o. PCP: Patient, No Pcp Per  Date of Admission: 06/23/2017  2:49 PM Date of Discharge: 06/24/2017 Attending Physician: Dr. Aldine Contes  Discharge Diagnosis: Active Problems:   Symptomatic anemia   Chronic back pain  Discharge Medications: Allergies as of 06/24/2017   No Known Allergies     Medication List    STOP taking these medications   busPIRone 7.5 MG tablet Commonly known as:  BUSPAR   sertraline 50 MG tablet Commonly known as:  ZOLOFT     TAKE these medications   ALEVE 220 MG tablet Generic drug:  naproxen sodium Take 220 mg by mouth daily as needed (for pain).   atorvastatin 40 MG tablet Commonly known as:  LIPITOR TAKE 1 TABLET BY MOUTH ONCE DAILY What changed:    how much to take  how to take this  when to take this   COLCRYS 0.6 MG tablet Generic drug:  colchicine Take 0.6 mg by mouth once.   diltiazem 360 MG 24 hr capsule Commonly known as:  CARDIZEM CD Take 1 capsule (360 mg total) by mouth daily.   DULoxetine 20 MG capsule Commonly known as:  CYMBALTA Take 1 capsule (20 mg total) by mouth daily.   HYDROcodone-acetaminophen 5-325 MG tablet Commonly known as:  NORCO/VICODIN Take 1-2 tablets by mouth every 6 (six) hours as needed.   ipratropium-albuterol 0.5-2.5 (3) MG/3ML Soln Commonly known as:  DUONEB Take 3 mLs by nebulization every 6 (six) hours as needed (FOR SHORTNESS OF BREATH-WHEEZING).   LORazepam 1 MG tablet Commonly known as:  ATIVAN Take 1 tablet (1 mg total) by mouth 3 (three) times daily as needed for anxiety. What changed:  how much to take   multivitamin with minerals tablet Take 1 tablet by mouth daily.   potassium chloride SA 20 MEQ tablet Commonly known as:  KLOR-CON M20 Take 3 tablets (60 mEq total) by mouth 2 (two) times daily.   torsemide 20 MG tablet Commonly known as:  DEMADEX Take 4 tabs daily every other day ALTERNATING with 3 tabs daily every  other day What changed:    how much to take  how to take this  when to take this  additional instructions   VENTOLIN HFA 108 (90 Base) MCG/ACT inhaler Generic drug:  albuterol Inhale 2 puffs into the lungs as needed.   XARELTO 20 MG Tabs tablet Generic drug:  rivaroxaban TAKE 1 TABLET BY MOUTH ONCE DAILY WITH SUPPER NEED (OFFICE VISIT) What changed:  See the new instructions.       Disposition and follow-up:   [name] was discharged from [facility] in Stable condition.  At the hospital follow up visit please address:  1.  Patient admitted with progressively worsening SOB and acute on chronic anemia 2/2 hemorrhoidal bleeding on Xarelto. She arrived with Hgb of 7.0, received 2 units pRBCs. Her Hgb on discharge was 8.9. She underwent endoscopy and colonoscopy but no acute source of her bleeding, including ulceration, diverticulosis, or malignancy, was identified. Please assess for signs/symptoms of lower GI bleeding. Please assess her SOB.    Patient also had worsening of chronic lower back pain on admission. Patient's daughter requested recommendations for this chronic pain. Suggested switching anti-depressants to Cymbalta, trying topical agents such as lidocaine or capsicin, and discussing possible use of muscle relaxants like baclofen or robaxin. Patient given referral to sports medicine as PT was thought to be helpful for previous episodes of back  pain.   2.  Labs / imaging needed at time of follow-up: CBC, consider iron studies if not already performed to work up microcytic anemia  3.  Pending labs/ test needing follow-up: Surg Path from Colonoscopy on 06/24/2016  Follow-up Appointments:   Hospital Course by problem list: Active Problems:   Symptomatic anemia   Chronic back pain   Nicole [name] is an 82 yo with a PMH of COPD, diastolic HF, A-Fib on Xarelto for anticoagulation, chronic low back pain and anxiety who presented for evaluation after  painless rectal bleeding that occurred on the morning of presentation. Upon arrival to the ED the patient was found to have a Hgb of 7 and pRBC transfusion was ordered. She was admitted to the internal medicine teaching service for management   Symptomatic Microcytic Anemia: The patient presented for evaluation of acute blood loss after having two bloody bowel movements on the morning of admission. On admission the patient's Hgb was 7.2. She remained hemodynamically stable during admission. Review of patient's blood work showed a progressively worsening chronic, microcytic anemia that had been present on labs from 01/2017 (baseline Hgb 12). The patient patient denied history of BRBPR, constipation, melena, and hematochezia. ROS was positive for progressively worsening SOB in the months leading up to admission, which had been attributed to patient's worsening of patient's baseline COPD and diastolic CHF. It appears that a component of the patient's progressively worsening SOB was related to worsening anemia, as her Hgb dropped from baseline of 12 to 10 on 04/2017 and 7.0 during current presentation. Given initial compliant of BRBPR, the most likely source of bleeding was GI loss. Patient never had a colonoscopy before, so there was concern on admission for diverticular bleeding and/or GI malignancy. GI was consulted in ED and patient was given transfusion of 2 pRBC's overnight. Her Hgb increased to 8.9 after receiving this transfusion and patient felt improvement in her symptoms of SOB. Overnight, the patient had no signs of blood in stool overnight and no overt bleeding on exam. She underwent endoscopy and colonoscopy on HD #1 that did not reveal upper GI ulcers, colonic diverticular disease, or lesions concerning for malignancy (although final pathology not resulted on discharge). Ultimately the source of her bleeding was thought to be hemorrhoidal in origin and the bleed was thought to be exacerbated by daily  Xarelto use. Since the patient was hemodynamically stable and without evidence of bleeding on exam, she was safe to resume Xarelto on discharge. Patient will have close follow up with PCP after discharge.  Chronic Back Pain: Patient complaining of chronic lower back pain which she states is a result of bilateral muscle spasms. The patient was counseled to avoid narcotic medications while inpatient, but rather to treat muscle spasms with topical analgesics such as lidocaine patches and capsaicin cream. She was instructed to use Tylenol for OTC pain relief, as NSAIDs can cause GI bleeding. She was started on Cymbalta per patient's daughter request to help with chronic pain and mood symptoms and her recently started zoloft was discontinued as a result. The patient was encouraged to follow up closely with PCP to discuss other medications including robaxin and/or baclofen which could help treat acute muscle spasms. Patient given referral to sports medicine for further evaluation of back pain as the patient did not think her pain doctor was helping with her chronic back pain.   Discharge Vitals:   BP 123/75 (BP Location: Left Arm)   Pulse 99   Temp 98.1 F (36.7  C) (Oral)   Resp 14   Ht 5\' 3"  (1.6 m)   Wt 218 lb 11.2 oz (99.2 kg)   SpO2 98%   BMI 38.74 kg/m   Pertinent Labs, Studies, and Procedures:   CMP Latest Ref Rng & Units 06/24/2017 06/23/2017 05/20/2017  Glucose 65 - 99 mg/dL 89 146(H) 138(H)  BUN 6 - 20 mg/dL 14 22(H) 16  Creatinine 0.44 - 1.00 mg/dL 0.92 1.11(H) 1.11(H)  Sodium 135 - 145 mmol/L 138 137 136  Potassium 3.5 - 5.1 mmol/L 4.2 4.0 2.9(L)  Chloride 101 - 111 mmol/L 102 101 93(L)  CO2 22 - 32 mmol/L 26 25 30   Calcium 8.9 - 10.3 mg/dL 8.5(L) 8.5(L) 9.4  Total Protein 6.5 - 8.1 g/dL - 5.4(L) -  Total Bilirubin 0.3 - 1.2 mg/dL - 0.9 -  Alkaline Phos 38 - 126 U/L - 58 -  AST 15 - 41 U/L - 37 -  ALT 14 - 54 U/L - 73(H) -   CBC Latest Ref Rng & Units 06/24/2017 06/23/2017  05/13/2017  WBC 4.0 - 10.5 K/uL - 11.8(H) 10.7(H)  Hemoglobin 12.0 - 15.0 g/dL 8.9(L) 7.0(L) 10.1(L)  Hematocrit 36.0 - 46.0 % 29.6(L) 24.3(L) 34.0(L)  Platelets 150 - 400 K/uL - 240 237   Upper GI Endoscopy 06/24/2017 - Normal esophagus. - Normal stomach. - Normal examined duodenum. - No specimens collected.  Colonoscopy 06/24/2017 Three sessile polyps were found in the transverse colon. The polyps were 3 to 4 mm in size. These polyps were removed with a cold snare. Resection and retrieval were complete. Care was taken with insertion of the colonoscope. The scope was able to pass through the ventral hernia portion without any issues. External pressure was applied to prevent any looping, but no looping occurred with inerstion. There was no evidence of diverticula or any stigmata of bleeding. It is likely that the bleeding was hemorrhoidal in origin on Xarelto. Findings: - Three 3 to 4 mm polyps in the transverse colon, removed with a cold snare. Resected  Discharge Instructions: Discharge Instructions    AMB referral to sports medicine   Complete by:  As directed    Call MD for:  difficulty breathing, headache or visual disturbances   Complete by:  As directed    Call MD for:  extreme fatigue   Complete by:  As directed    Call MD for:  persistant dizziness or light-headedness   Complete by:  As directed    Call MD for:  temperature >100.4   Complete by:  As directed    Diet - low sodium heart healthy   Complete by:  As directed    Discharge instructions   Complete by:  As directed    You were evaluated for progressively worsening shortness of breath, which may be due to the low red blood cell counts we found on admission. You were given to units of blood and your hemoglobin rose appropriately to 9.0 after this transfusion. You had a colonoscopy and an endoscopy which did not identify any masses or lesions which could be responsible for your bleeding. The GI doctor thought that your  bleeding may have been due to hemorrhoids and that you lost so much blood because you are taking Xarelto as a result of your irregular heart rhythm. It is safe to restart Xarelto after leaving the hospital. Please follow up with your primary care physician after leaving the hospital to make sure that your blood counts are staying up. Please inquire  about iron supplementation during this visit to see if this will help bring your blood counts up.  For your chronic back pain, we recommend starting Cymbalta rather than Zoloft. Cymbalta can help with your mood and with chronic pain. You should continue to use lidocaine patches if these help. You are also free to try other over the counter topical medications such as Capsaisin creme - this medication sometimes relieves pain better than lidocaine patchese. You should take scheduled Tylenol for your pain as well. This medication is preferred to ibuprofen as NSAIDs can cause GI upset and/or GI bleeding. Talk to your primary care physician about taking medications such as robaxin or baclofen to treat muscle spasms. These medications, if taken daily for short periods of time, are often quite effective at treating back pain that results from intermittent muscle spasms. You can also try taking hot baths before bedtime to relieve these muscles prior to sleep. Try to stay active and complete muscle stretches daily, as laying in bed or sitting too much can aggravate chronic back pain that occurs as a result of muscle spasms. If have put in a referral to sports medicine because these doctors can help evaluate you and provide recommendations for stretching exercises, medications, and other interventions, such as back braces, to help manage your daily pain. Since you were not interested in surgery, I did not refer you to a neurosurgeon - I think sports medicine will be a better fit for you and your needs. Please talk to your primary care provider about a referral for neurosurgery if  you think surgery is something you may want in the future.   Increase activity slowly   Complete by:  As directed       Signed: Thomasene Ripple, MD 06/24/2017, 5:33 PM   Pager: [phone]

## 2017-06-24 NOTE — Op Note (Signed)
[facility] Patient Name: Nicole [name] Procedure Date : 06/24/2017 MRN: [id number] Attending MD: Carol Ada , MD Date of Birth: Dec 07, 1935 CSN: [id number] Age: 82 Admit Type: Inpatient Procedure:                Colonoscopy Indications:              Hematochezia Providers:                Carol Ada, MD, Laverta Baltimore RN, RN, Alan Mulder, Technician, Virgilio Belling. Beckner, CRNA Referring MD:              Medicines:                Propofol per Anesthesia Complications:            No immediate complications. Estimated Blood Loss:     Estimated blood loss was minimal. Procedure:                Pre-Anesthesia Assessment:                           - Prior to the procedure, a History and Physical                            was performed, and patient medications and                            allergies were reviewed. The patient's tolerance of                            previous anesthesia was also reviewed. The risks                            and benefits of the procedure and the sedation                            options and risks were discussed with the patient.                            All questions were answered, and informed consent                            was obtained. Prior Anticoagulants: The patient has                            taken Xarelto (rivaroxaban), last dose was 1 day                            prior to procedure. ASA Grade Assessment: III - A                            patient with severe systemic disease. After  reviewing the risks and benefits, the patient was                            deemed in satisfactory condition to undergo the                            procedure.                           - Sedation was administered by an anesthesia                            professional. Deep sedation was attained.                           After obtaining informed consent, the colonoscope                     was passed under direct vision. Throughout the                            procedure, the patient's blood pressure, pulse, and                            oxygen saturations were monitored continuously. The                            EC-[id number] ([id number]) scope was introduced through                            the anus and advanced to the the cecum, identified                            by appendiceal orifice and ileocecal valve. The                            colonoscopy was performed without difficulty. The                            patient tolerated the procedure well. The quality                            of the bowel preparation was adequate to identify                            polyps. The ileocecal valve, appendiceal orifice,                            and rectum were photographed. Scope In: 9:16:52 AM Scope Out: 9:36:13 AM Scope Withdrawal Time: 0 hours 13 minutes 56 seconds  Total Procedure Duration: 0 hours 19 minutes 21 seconds  Findings:      Three sessile polyps were found in the transverse colon. The polyps were       3 to 4 mm in size. These polyps were removed with a cold snare.  Resection and retrieval were complete.      Care was taken with insertion of the colonoscope. The scope was able to       pass through the ventral hernia portion without any issues. External       pressure was applied to prevent any looping, but no looping occurred       with inerstion. There was no evidence of diverticula or any stigmata of       bleeding. It is likely that the bleeding was hemorrhoidal in origin on       Xarelto. Impression:               - Three 3 to 4 mm polyps in the transverse colon,                            removed with a cold snare. Resected and retrieved. Moderate Sedation:      None Recommendation:           - Return patient to hospital ward for ongoing care.                           - Resume regular diet.                           - Continue  present medications.                           - Await pathology results.                           - Repeat colonoscopy is not recommended for                            surveillance with her severe comorbidities and age. Procedure Code(s):        --- Professional ---                           [phone], Colonoscopy, flexible; with removal of                            tumor(s), polyp(s), or other lesion(s) by snare                            technique Diagnosis Code(s):        --- Professional ---                           D12.3, Benign neoplasm of transverse colon (hepatic                            flexure or splenic flexure)                           K92.1, Melena (includes Hematochezia) CPT copyright 2016 American [employer]. All rights reserved. The codes documented in this report are preliminary and upon coder review may  be revised to meet current compliance requirements. Carol Ada, MD Carol Ada, MD 06/24/2017 9:42:11 AM This report has  been signed electronically. Number of Addenda: 0

## 2017-06-24 NOTE — Anesthesia Postprocedure Evaluation (Signed)
Anesthesia Post Note  Patient: Nicole [name]  Procedure(s) Performed: COLONOSCOPY WITH PROPOFOL (N/A ) ESOPHAGOGASTRODUODENOSCOPY (EGD) (N/A )     Patient location during evaluation: PACU Anesthesia Type: MAC Level of consciousness: awake and alert Pain management: pain level controlled Vital Signs Assessment: post-procedure vital signs reviewed and stable Respiratory status: spontaneous breathing Cardiovascular status: stable Anesthetic complications: no    Last Vitals:  Vitals:   06/24/17 1200 06/24/17 1521  BP: (!) 121/50 123/75  Pulse: (!) 103 99  Resp: 16 14  Temp:  36.7 C  SpO2: 98% 98%    Last Pain:  Vitals:   06/24/17 1521  TempSrc: Oral  PainSc:                  Nolon Nations

## 2017-06-24 NOTE — Progress Notes (Signed)
  Date: 06/24/2017  Patient name: Nicole [name]  Medical record number: [id number]  Date of birth: 18-Aug-1935   I have seen and evaluated Baron Sane and discussed their care with the Residency Team.  In brief, patient is an 82 year old female with a past medical history of COPD, chronic diastolic heart failure, A. fib on Xarelto, chronic low back pain and anxiety who presented with bright red blood per rectum x2 episodes.  Patient states that he been having worsening back pain over the last couple of weeks but felt well otherwise.  On the morning of admission she said she had to strain to have a bowel movement and noticed bright red blood in the toilet bowl and some associated abdominal cramping and nausea.  Patient then had another similar episode later in the day and showed that to her daughter who is a Marine scientist and was brought to the ED for further workup.  No chest pain, shortness of breath, no lightheadedness, no syncope, no focal weakness, no palpitations, diaphoresis, no headache, no blurry vision, no tingling or numbness.  Today patient states that she feels well except for her chronic back pain.  She has had no further recurrent bloody bowel movements.  PMHx, Fam Hx, and/or Soc Hx : As per resident admit note  Vitals:   06/24/17 1148 06/24/17 1200  BP: 94/64 (!) 121/50  Pulse: 82 (!) 103  Resp: 16 16  Temp:    SpO2: 95% 98%   General: Awake alert and oriented x3, NAD CVS: Regular rate and rhythm, normal heart sounds Lungs: CTA bilaterally Abdomen: Soft, nontender, nondistended, normoactive bowel sounds Extremities: No edema noted  Assessment and Plan: I have seen and evaluated the patient as outlined above. I agree with the formulated Assessment and Plan as detailed in the residents' note, with the following changes:   1.  Lower GI bleed: -Patient presented to the ED with 2 episodes of bright red blood per rectum in the setting of having to strain for her bowel movement and being on  anticoagulation for her A. fib.  Of note, patient was anemic with a hemoglobin of 7 from a baseline of 10-11.  I suspect that she may have had some hemorrhoidal bleeding leading to some acute blood loss anemia.  I do suspect that the patient's hemoglobin likely trended down over longer period of time with likely intermittent bleeding given that she was microcytic on presentation and would likely be more symptomatic with a three-point drop in hemoglobin. -Patient status post 2 units PRBC with appropriate increase in her hemoglobin -GI follow-up recognitions appreciated.  Patient is now status post EGD and colonoscopy with no clear etiology for her bleed -Continue clear liquid diet and advance as tolerated -We will consider resuming Xarelto for her A. fib today -No further workup for now.  Patient stable for discharge home later today if she continues to remain stable  Aldine Contes, MD 1/25/20192:01 PM

## 2017-06-24 NOTE — Progress Notes (Signed)
Pt is drowsy and arousable with daughter and nursing student at beside vitals stable and bowel sounds distant hypoactive.

## 2017-06-24 NOTE — Transfer of Care (Signed)
Immediate Anesthesia Transfer of Care Note  Patient: Nicole [name]  Procedure(s) Performed: COLONOSCOPY WITH PROPOFOL (N/A ) ESOPHAGOGASTRODUODENOSCOPY (EGD) (N/A )  Patient Location: Endoscopy Unit  Anesthesia Type:MAC  Level of Consciousness: awake, alert  and oriented  Airway & Oxygen Therapy: Patient Spontanous Breathing and Patient connected to nasal cannula oxygen  Post-op Assessment: Report given to RN, Post -op Vital signs reviewed and stable and Patient moving all extremities X 4  Post vital signs: Reviewed and stable  Last Vitals:  Vitals:   06/24/17 0807 06/24/17 0939  BP: 125/66 (!) 89/53  Pulse: 97 85  Resp: (!) 34 18  Temp: 36.8 C   SpO2: 96% 100%    Last Pain:  Vitals:   06/24/17 0939  TempSrc: Oral  PainSc:          Complications: No apparent anesthesia complications

## 2017-06-24 NOTE — Progress Notes (Signed)
Patient received bowel prep as ordered. Patient unable to drink 200cc of prep. Had some vomiting when attempting to complete prep. Patient has been having liquid stools,tan in color,no solid pieces noted.

## 2017-06-24 NOTE — Anesthesia Preprocedure Evaluation (Addendum)
Anesthesia Evaluation  Patient identified by MRN, date of birth, ID band Patient awake    Reviewed: Allergy & Precautions, NPO status , Patient's Chart, lab work & pertinent test results  Airway Mallampati: I  TM Distance: >3 FB Neck ROM: Full    Dental  (+) Lower Dentures, Dental Advisory Given, Upper Dentures   Pulmonary COPD, former smoker,    breath sounds clear to auscultation       Cardiovascular + CAD and +CHF   Rhythm:Irregular     Neuro/Psych    GI/Hepatic   Endo/Other  Hyperthyroidism   Renal/GU      Musculoskeletal   Abdominal   Peds  Hematology   Anesthesia Other Findings   Reproductive/Obstetrics                            Anesthesia Physical  Anesthesia Plan  ASA: III  Anesthesia Plan: MAC   Post-op Pain Management:    Induction: Intravenous  PONV Risk Score and Plan: Propofol infusion and Treatment may vary due to age or medical condition  Airway Management Planned: Simple Face Mask  Additional Equipment: None  Intra-op Plan:   Post-operative Plan:   Informed Consent: I have reviewed the patients History and Physical, chart, labs and discussed the procedure including the risks, benefits and alternatives for the proposed anesthesia with the patient or authorized representative who has indicated his/her understanding and acceptance.   Dental advisory given  Plan Discussed with: CRNA  Anesthesia Plan Comments:         Anesthesia Quick Evaluation

## 2017-06-24 NOTE — Progress Notes (Signed)
Pt transported to Endo for colonoscopy, off tele CCMD notified

## 2017-06-24 NOTE — Progress Notes (Signed)
   Subjective:  Patient seen laying comfortably in bed this afternoon in no acute distress. States her breathing improved overnight. She is drowsy today but no other acute complaints.  Objective:  Vital signs in last 24 hours: Vitals:   06/24/17 1114 06/24/17 1134 06/24/17 1148 06/24/17 1200  BP: (!) 99/53 94/64 94/64  (!) 121/50  Pulse: 87 71 82 (!) 103  Resp: 15 16 16 16   Temp:  98.1 F (36.7 C)    TempSrc:  Oral    SpO2: 97% 95% 95% 98%  Weight:      Height:       Physical Exam  Constitutional: She appears well-developed and well-nourished. No distress.  Cardiovascular: Normal rate and intact distal pulses. Exam reveals no friction rub.  No murmur heard. Irregular rhythm.  Respiratory: Effort normal. No respiratory distress. She has no wheezes. She has no rales.  GI: Soft. She exhibits no distension. There is no tenderness. There is no rebound.  Musculoskeletal: She exhibits no edema (of bilateral lower extremities) or tenderness (of bilateral lower extremities).  Skin: Skin is warm and dry. No rash noted. No erythema.   Assessment/Plan:  Active Problems:   Symptomatic anemia  Jordann Grime is an 82 yo with a PMH of COPD, diastolic HF, A-Fib on Xarelto for anticoagulation, chronic low back pain and anxiety who presented for evaluation after painless rectal bleeding on the morning of presentation. Upon arrival to the ED the patient was found to have a Hgb of 7 and pRBC transfusion was ordered. She was admitted to the internal m  Symptomatic Microcytic Anemia: Patient's complaints of SOB subjectively improved after 2 units pRBC's yesterday, Hgb 8.9 this AM. Patient does not have overt signs or symptoms of bleeding on exam. Patient had colonoscopy and EGD which does not reveal any ulcerations, bleeding masses, or diverticula. The patient's bleed episode likely 2/2 hemorrhoids in the setting of Xarelto use.  -GI consulted and appreciated -Clear liquid diet, advance as  tolerated  A-Fib and Hx of diastolic heart failure: Patient has irregular rhythm on exam but no evidence of tachycardia or volume overload. Patient hypotensive after procedure today, therefore holding home diltiazem. -OK to restart Xarelto per GI -Holding diltiazem 360 mg -Continuous cardiorespiratory monitoring  COPD: Patient comfortable on exam, subjective improvement in breathing -DuoNebs q3 hours PRN -Home Breo Ellipta daily  Chronic Back Pain: Patient complaining of chronic lower back pain which she states is a result of bilateral muscle spasms. Patient will need to follow up as an outpatient with pain management specialist. Patient hypotensive after procedure so do not want to provide additional narcotic medications at this time.  -Lidocaine patch -Tylenol and Tramadol PRN for pain  FEN/GI: -Advance diet as tolerated -No IVF -Protonix  VTE Prophylaxis: SCD's while in bed, ambulate as tolerated, Xarelto resumed today. Code Status: Full  Dispo: Anticipated discharge in approximately 0-1 day(s).   Thomasene Ripple, MD 06/24/2017, 1:21 PM Pager: [phone]

## 2017-06-24 NOTE — Op Note (Signed)
[facility] Patient Name: Nicole [name] Procedure Date : 06/24/2017 MRN: [id number] Attending MD: Carol Ada , MD Date of Birth: 06/18/1935 CSN: [id number] Age: 82 Admit Type: Inpatient Procedure:                Upper GI endoscopy Indications:              Iron deficiency anemia Providers:                Carol Ada, MD, Laverta Baltimore RN, RN, Alan Mulder, Technician, Virgilio Belling. Beckner, CRNA Referring MD:              Medicines:                Propofol per Anesthesia Complications:            No immediate complications. Estimated Blood Loss:     Estimated blood loss: none. Procedure:                Pre-Anesthesia Assessment:                           - Prior to the procedure, a History and Physical                            was performed, and patient medications and                            allergies were reviewed. The patient's tolerance of                            previous anesthesia was also reviewed. The risks                            and benefits of the procedure and the sedation                            options and risks were discussed with the patient.                            All questions were answered, and informed consent                            was obtained. Prior Anticoagulants: The patient has                            taken Xarelto (rivaroxaban), last dose was 1 day                            prior to procedure. ASA Grade Assessment: III - A                            patient with severe systemic disease. After  reviewing the risks and benefits, the patient was                            deemed in satisfactory condition to undergo the                            procedure.                           - Sedation was administered by an anesthesia                            professional. Deep sedation was attained.                           After obtaining informed consent, the endoscope  was                            passed under direct vision. Throughout the                            procedure, the patient's blood pressure, pulse, and                            oxygen saturations were monitored continuously. The                            EC-[id number] ([id number]) scope was introduced through                            the mouth, and advanced to the second part of                            duodenum. The upper GI endoscopy was accomplished                            without difficulty. The patient tolerated the                            procedure well. Scope In: Scope Out: Findings:      The esophagus was normal.      The stomach was normal.      The examined duodenum was normal. Impression:               - Normal esophagus.                           - Normal stomach.                           - Normal examined duodenum.                           - No specimens collected. Moderate Sedation:      None Recommendation:           - Return patient to hospital ward for  ongoing care.                           - Resume regular diet.                           - Continue present medications.                           - Okay to resume Xarelto. Some minor bleeding can                            occur with the small polypectomies during the                            colonoscopy. Procedure Code(s):        --- Professional ---                           [phone], Esophagogastroduodenoscopy, flexible,                            transoral; diagnostic, including collection of                            specimen(s) by brushing or washing, when performed                            (separate procedure) Diagnosis Code(s):        --- Professional ---                           D50.9, Iron deficiency anemia, unspecified CPT copyright 2016 American Medical Association. All rights reserved. The codes documented in this report are preliminary and upon coder review may  be revised to meet current  compliance requirements. Carol Ada, MD Carol Ada, MD 06/24/2017 9:45:46 AM This report has been signed electronically. Number of Addenda: 0

## 2017-06-26 ENCOUNTER — Encounter (HOSPITAL_COMMUNITY): Payer: Self-pay | Admitting: Gastroenterology

## 2017-06-27 ENCOUNTER — Telehealth (HOSPITAL_COMMUNITY): Payer: Self-pay | Admitting: *Deleted

## 2017-06-27 NOTE — Telephone Encounter (Signed)
Received medical record request from [facility].  Requested records faxed today to [phone].    Original request will be scanned to patient's electronic medical record.

## 2017-07-06 DIAGNOSIS — D509 Iron deficiency anemia, unspecified: Secondary | ICD-10-CM | POA: Diagnosis not present

## 2017-07-07 ENCOUNTER — Other Ambulatory Visit (HOSPITAL_COMMUNITY): Payer: Self-pay

## 2017-07-07 ENCOUNTER — Telehealth (HOSPITAL_COMMUNITY): Payer: Self-pay | Admitting: *Deleted

## 2017-07-07 MED ORDER — APIXABAN 5 MG PO TABS: 5.0000 mg | ORAL_TABLET | Freq: Two times a day (BID) | ORAL | 3 refills | Status: DC

## 2017-07-07 NOTE — Addendum Note (Signed)
Addended by: Harvie Junior on: 07/07/2017 03:01 PM   Modules accepted: Orders

## 2017-07-07 NOTE — Telephone Encounter (Signed)
Nicole [name] aware and agreeable.

## 2017-07-07 NOTE — Telephone Encounter (Signed)
She needs prescription for Eliquis 5 mg bid.  Does not have to come to appointment (can reschedule) if feeling ok.

## 2017-07-07 NOTE — Telephone Encounter (Signed)
Pts daughter called Dr.McLean told her mom to stop Xarelto and start eliquis because of GI bleed. She has been off Xarelto since Thursday but has not received a script for eliquis. Also wants to know if she needs to keep her appointment next week.   Message routed to [place] for advice.

## 2017-07-14 ENCOUNTER — Encounter (HOSPITAL_COMMUNITY): Payer: Medicare Other

## 2017-08-10 ENCOUNTER — Other Ambulatory Visit (HOSPITAL_COMMUNITY): Payer: Self-pay | Admitting: *Deleted

## 2017-08-10 MED ORDER — DILTIAZEM HCL ER COATED BEADS 240 MG PO CP24: 240.0000 mg | ORAL_CAPSULE | Freq: Every day | ORAL | 3 refills | Status: DC

## 2017-08-10 NOTE — Telephone Encounter (Signed)
Ps daughter called requesting decrease in patients cardizem. Dose was increased due to her feeling bad and heart rate up shortly after that she was admitted for anemia/bleeding. Patients heart rate is now running low and insurance will not cover the higher dose of Cardizem. Per Dr.McLean patient may decrease Cardizem to 240mg  instead of the higher 360mg  dose. Pts daughter aware and agreeable new script sent to pharmacy

## 2017-08-22 ENCOUNTER — Telehealth (HOSPITAL_COMMUNITY): Payer: Self-pay

## 2017-08-22 NOTE — Telephone Encounter (Signed)
[facility] appointment reminder call placed to patient for upcoming appointment.  LVMTCB.   Patient also reminded to take all medications as prescribed on the day of his/her appointment and to bring all medications to this appointment.  Advised to call our office for tardiness or cancellations/rescheduling needs.  .Lindsay Soulliere Genevea  

## 2017-08-23 ENCOUNTER — Encounter (HOSPITAL_COMMUNITY): Payer: Self-pay

## 2017-08-23 ENCOUNTER — Ambulatory Visit (HOSPITAL_COMMUNITY)
Admission: RE | Admit: 2017-08-23 | Discharge: 2017-08-23 | Disposition: A | Discharge status: Home / Self Care | Payer: Medicare Other | Source: Ambulatory Visit | Attending: Internal Medicine | Admitting: Internal Medicine

## 2017-08-23 VITALS — BP 138/82 | HR 76 | Wt 208.2 lb

## 2017-08-23 DIAGNOSIS — I5032 Chronic diastolic (congestive) heart failure: Secondary | ICD-10-CM

## 2017-08-23 DIAGNOSIS — Z7901 Long term (current) use of anticoagulants: Secondary | ICD-10-CM | POA: Insufficient documentation

## 2017-08-23 DIAGNOSIS — I481 Persistent atrial fibrillation: Secondary | ICD-10-CM | POA: Diagnosis not present

## 2017-08-23 DIAGNOSIS — E785 Hyperlipidemia, unspecified: Secondary | ICD-10-CM | POA: Insufficient documentation

## 2017-08-23 DIAGNOSIS — I4819 Other persistent atrial fibrillation: Secondary | ICD-10-CM

## 2017-08-23 DIAGNOSIS — E0590 Thyrotoxicosis, unspecified without thyrotoxic crisis or storm: Secondary | ICD-10-CM | POA: Insufficient documentation

## 2017-08-23 DIAGNOSIS — Z952 Presence of prosthetic heart valve: Secondary | ICD-10-CM | POA: Diagnosis not present

## 2017-08-23 DIAGNOSIS — I350 Nonrheumatic aortic (valve) stenosis: Secondary | ICD-10-CM | POA: Diagnosis not present

## 2017-08-23 DIAGNOSIS — Z79899 Other long term (current) drug therapy: Secondary | ICD-10-CM | POA: Diagnosis not present

## 2017-08-23 DIAGNOSIS — J449 Chronic obstructive pulmonary disease, unspecified: Secondary | ICD-10-CM | POA: Diagnosis not present

## 2017-08-23 LAB — CBC
HCT: 42 % (ref 36.0–46.0)
Hemoglobin: 13.1 g/dL (ref 12.0–15.0)
MCH: 26 pg (ref 26.0–34.0)
MCHC: 31.2 g/dL (ref 30.0–36.0)
MCV: 83.5 fL (ref 78.0–100.0)
PLATELETS: 213 10*3/uL (ref 150–400)
RBC: 5.03 MIL/uL (ref 3.87–5.11)
RDW: 18.5 % — AB (ref 11.5–15.5)
WBC: 8.3 10*3/uL (ref 4.0–10.5)

## 2017-08-23 LAB — BASIC METABOLIC PANEL
Anion gap: 10 (ref 5–15)
BUN: 9 mg/dL (ref 6–20)
CO2: 26 mmol/L (ref 22–32)
CREATININE: 0.85 mg/dL (ref 0.44–1.00)
Calcium: 9.2 mg/dL (ref 8.9–10.3)
Chloride: 104 mmol/L (ref 101–111)
GFR calc Af Amer: >60 mL/min (ref >60)
Glucose, Bld: 123 mg/dL — ABNORMAL HIGH (ref 65–99)
POTASSIUM: 3.8 mmol/L (ref 3.5–5.1)
SODIUM: 140 mmol/L (ref 135–145)

## 2017-08-23 NOTE — Patient Instructions (Signed)
Routine lab work today. Will notify you of abnormal results, otherwise no news is good news!  No changes to medication at this time.  Follow up 3 months with Amy Clegg NP-C.  ___________________________________________________________ Nicole [name] Code:  5830   Take all medication as prescribed the day of your appointment. Bring all medications with you to your appointment.  Do the following things EVERYDAY: 1) Weigh yourself in the morning before breakfast. Write it down and keep it in a log. 2) Take your medicines as prescribed 3) Eat low salt foods-Limit salt (sodium) to 2000 mg per day.  4) Stay as active as you can everyday 5) Limit all fluids for the day to less than 2 liters

## 2017-08-23 NOTE — Progress Notes (Signed)
Patient ID: Nicole [name], female   DOB: 06-19-1935, 82 y.o.   MRN: [id number] PCP: Nicole [name] [facility] HF Cardiology: Dr. Aundra Dubin  Nicole [name] is a 82 yo with history of COPD (63 pack years quit April 2014), atrial fibrillation s/p AVR with pericardial valve, mitral annuloplasty, MAZE procedure and clipping of the LA appendage ([phone]) and diastolic CHF. Daughter is Personnel officer ([employer] nurse).   She went back into atrial fibrillation around 5/16.  Given increased symptoms when in atrial fibrillation, I started her on amiodarone and planned for DCCV after amiodarone load. She went out of atrial fibrillation and into a stable junctional rhythm with rate in the 60s.  She also developed subclinical hyperthyroidism.  I stopped her amiodarone.  She saw an endocrinologist who recommended that she take methimazole.  However, she never started it.  No BRBPR or melena on Xarelto.   She went back into atrial fibrillation again in 1/18.  We decided to give her a trial of DCCV without starting an antiarrhythmic.  DCCV was attempted in 1/18 but failed. She remains in atrial fibrillation today.   Admitted 06/23/17 with increased shortness of breath. Hgb on admit 7. She had been on xarelto. Received 2UPRBCs. EGD and colonoscopy performed. No source of bleeding identified. Discharged back on xarelto but this was stopped and she was switched to eliquis 5 mg twice a day.   Today she returns for HF follow up. Overall feeling fine. She has ongoing back pain. Uses rolling walker.  Denies SOB/PND/Orthopnea. Denies BRBPRAppetite ok. No fever or chills. Taking all medications  Labs (9/14): K 4, creatinine 0.9 => 1.04, HCT 33.7, TnI 0.21 (while hospitalized in [place]), BNP 158, TSH normal, AST normal, ALT 39 Labs (03/16/13):  K 3.9 Cr 1.0 Labs (6/16): K 4.3, creatinine 0.99, Hgb 14.6 Labs (1/17): K 4.3, creatinine 1.08 Labs (2/17): K 3.5, creatinine 1.04, BNP 220, free T3 normal, free T4 high, TSH 0.09, LFTs normal Labs (6/17): K  3.7, creatinine 1.04, TSH/free T3/free T4 normal, LDL 54, HDL 54 Labs (1/18): K 3.9, creatinine 1.08, BNP 129, HCT 40.2 Labs (2/18): K 4.5, creatinine 0.86 Labs (5/18): TSH normal Labs (9/18): K 4, creatinine 1.0 Labs (12/18): K 3.3, creatinine 0.96 Labs (06/24/2017): K 3.8 Creatinine 0.85   PMH: 1. COPD: PFTs (5/15) with FEV1 61%, RVC 63%, ratio 96%, DLCO 50%.   2. Atrial fibrillation: First noted in 7/14.  DCCV in 8/14 to NSR.  Recurred by 9/14.  DCCV to NSR 9/14 but recurred. S/p Maze procedure 10/14.  Recurrent atrial fibrillation around 5/16, later developed a junctional rhythm on amiodarone and amiodarone stopped.  She was noted to be back in atrial fibrillation in 1/18, failed DCCV in 1/18.  3. Hyperlipidemia 4. Diastolic CHF: Echo (3/87) with EF 55-60%, grade II diastolic dysfunction, mild LVH, moderate to severe AS with mean gradient 33 mmHg/peak 49 mmHg and AVA 0.74 cm^2.  Echo (2/17) with EF 60-65%, mild focal basal septal hypertrophy, bioprosthetic aortic valve appeared normal, s/p mitral valve repair with no significant mitral stenosis.  - Echo (12/18): EF 60-65%, moderate RV dilation with mildly decreased systolic function, peak RV-RA gradient 31 mmHg, normal bioprosthetic aortic valve, normal repaired mitral valve.  5. Severe AS/Moderate MR. 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 Normal coronaries cath 10/14 7. Ventral hernia.  8. Low back pain: Lumbar disc disease.  9. Hyperthyroidism: Subclinical.  Likely related to amiodarone use.  10.  Junctional rhythm.   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  Lives alone.  Daughter come to appointments.   FH: No premature CAD.  Breast cancer.   ROS: All systems reviewed and negative except as per HPI.   Current Outpatient Medications  Medication Sig Dispense Refill  . apixaban (ELIQUIS) 5 MG TABS tablet Take 1 tablet (5 mg total) by mouth 2  (two) times daily. 60 tablet 3  . atorvastatin (LIPITOR) 40 MG tablet TAKE 1 TABLET BY MOUTH ONCE DAILY (Patient taking differently: TAKE 40 mg  TABLET BY MOUTH in the evening) 90 tablet 2  . COLCRYS 0.6 MG tablet Take 0.6 mg by mouth once.    . diltiazem (CARDIZEM CD) 240 MG 24 hr capsule Take 1 capsule (240 mg total) by mouth daily. 30 capsule 3  . DULoxetine (CYMBALTA) 20 MG capsule Take 1 capsule (20 mg total) by mouth daily. 30 capsule 0  . HYDROcodone-acetaminophen (NORCO/VICODIN) 5-325 MG tablet Take 1-2 tablets by mouth every 6 (six) hours as needed. 10 tablet 0  . ipratropium-albuterol (DUONEB) 0.5-2.5 (3) MG/3ML SOLN Take 3 mLs by nebulization every 6 (six) hours as needed (FOR SHORTNESS OF BREATH-WHEEZING).     . LORazepam (ATIVAN) 1 MG tablet Take 1 tablet (1 mg total) by mouth 3 (three) times daily as needed for anxiety. (Patient taking differently: Take 0.5 mg by mouth 3 (three) times daily as needed for anxiety. ) 15 tablet 0  . Multiple Vitamins-Minerals (MULTIVITAMIN WITH MINERALS) tablet Take 1 tablet by mouth daily.    . naproxen sodium (ALEVE) 220 MG tablet Take 220 mg by mouth daily as needed (for pain).    . potassium chloride SA (KLOR-CON M20) 20 MEQ tablet Take 3 tablets (60 mEq total) by mouth 2 (two) times daily. 180 tablet 3  . torsemide (DEMADEX) 20 MG tablet Take 4 tabs daily every other day ALTERNATING with 3 tabs daily every other day (Patient taking differently: Take 80 mg by mouth daily. Take 4 tabs daily every other day ALTERNATING with 3 tabs daily every other day) 120 tablet 3  . VENTOLIN HFA 108 (90 Base) MCG/ACT inhaler Inhale 2 puffs into the lungs as needed.  12   No current facility-administered medications for this encounter.     Vitals:   08/23/17 1108  BP: 138/82  Pulse: 76  SpO2: 94%  Weight: 208 lb 3.2 oz (94.4 kg)   Physical Exam: General:  Well appearing. No resp difficulty. Ambulated in the clinic using a rolling walker. Daughter was present.    HEENT: normal Neck: supple. no JVD. Carotids 2+ bilat; no bruits. No lymphadenopathy or thryomegaly appreciated. Cor: PMI nondisplaced. Irregular rate & rhythm. No rubs, gallops or murmurs. Lungs: clear Abdomen: soft, nontender, nondistended. No hepatosplenomegaly. No bruits or masses. Good bowel sounds. Extremities: no cyanosis, clubbing, rash, R and LLE trace edema Neuro: alert & orientedx3, cranial nerves grossly intact. moves all 4 extremities w/o difficulty. Affect pleasant  Assessment/Plan: 1. Valvular heart disease: Severe aortic stenosis and moderate MR, s/p AVR (tissue) and MV ring with Maze procedure 03/15/13.  - Aortic and mitral valves appeared stable on 12/18 echo 2. Atrial fibrillation: s/p MAZE. She had been on amiodarone to keep her in NSR.  She went into a regular junctional rhythm on amiodarone.  She also developed hyperthyroidism in the setting of amiodarone use. Amiodarone was stopped. She is now in persistent atrial fibrillation and failed DCCV in 1/18. Rate controlled.   - Off xarelto  due to gi bleed. Now on eliquis and has not had any difficulties.  -Check CBC today.   - Given prior MAZE, she is probably not a good atrial fibrillation ablation candidate.  - She will stay off amiodarone.   - Continue control/anticoagulation strategy.  3. Chronic diastolic CHF:   NYHA II-III. Volume status stable. Continue torsemide 80 mg daily. Check BMET today.  I have asked her to use compression to help with mild lower extremity edema. 4. COPD: stable. She no longer is smoking. Sees Dr. Luan Pulling.  5. Hyperthyroidism: Most recent thyroid indices back to normal off amiodarone.    Follow up in 3 months. CBC and BMET today.   Tynasia Mccaul NP-C  08/23/2017

## 2017-08-27 ENCOUNTER — Other Ambulatory Visit: Payer: Self-pay | Admitting: Internal Medicine

## 2017-08-28 ENCOUNTER — Other Ambulatory Visit (HOSPITAL_COMMUNITY): Payer: Self-pay | Admitting: Cardiology

## 2017-09-09 ENCOUNTER — Other Ambulatory Visit (HOSPITAL_COMMUNITY): Payer: Self-pay | Admitting: Cardiology

## 2017-10-31 ENCOUNTER — Ambulatory Visit (HOSPITAL_COMMUNITY)
Admission: RE | Admit: 2017-10-31 | Discharge: 2017-10-31 | Disposition: A | Discharge status: Home / Self Care | Payer: Medicare Other | Source: Ambulatory Visit | Attending: Internal Medicine | Admitting: Internal Medicine

## 2017-10-31 ENCOUNTER — Encounter (HOSPITAL_COMMUNITY): Payer: Self-pay

## 2017-10-31 VITALS — BP 118/64 | HR 85 | Wt 206.8 lb

## 2017-10-31 DIAGNOSIS — I481 Persistent atrial fibrillation: Secondary | ICD-10-CM | POA: Diagnosis not present

## 2017-10-31 DIAGNOSIS — I350 Nonrheumatic aortic (valve) stenosis: Secondary | ICD-10-CM | POA: Insufficient documentation

## 2017-10-31 DIAGNOSIS — E0590 Thyrotoxicosis, unspecified without thyrotoxic crisis or storm: Secondary | ICD-10-CM | POA: Diagnosis not present

## 2017-10-31 DIAGNOSIS — Z952 Presence of prosthetic heart valve: Secondary | ICD-10-CM | POA: Insufficient documentation

## 2017-10-31 DIAGNOSIS — J449 Chronic obstructive pulmonary disease, unspecified: Secondary | ICD-10-CM | POA: Insufficient documentation

## 2017-10-31 DIAGNOSIS — Z9889 Other specified postprocedural states: Secondary | ICD-10-CM | POA: Diagnosis not present

## 2017-10-31 DIAGNOSIS — E785 Hyperlipidemia, unspecified: Secondary | ICD-10-CM | POA: Insufficient documentation

## 2017-10-31 DIAGNOSIS — I4891 Unspecified atrial fibrillation: Secondary | ICD-10-CM | POA: Insufficient documentation

## 2017-10-31 DIAGNOSIS — I5032 Chronic diastolic (congestive) heart failure: Secondary | ICD-10-CM | POA: Insufficient documentation

## 2017-10-31 DIAGNOSIS — Z7901 Long term (current) use of anticoagulants: Secondary | ICD-10-CM | POA: Insufficient documentation

## 2017-10-31 DIAGNOSIS — Z79899 Other long term (current) drug therapy: Secondary | ICD-10-CM | POA: Diagnosis not present

## 2017-10-31 DIAGNOSIS — I4819 Other persistent atrial fibrillation: Secondary | ICD-10-CM

## 2017-10-31 LAB — BASIC METABOLIC PANEL
ANION GAP: 9 (ref 5–15)
BUN: 12 mg/dL (ref 6–20)
CHLORIDE: 103 mmol/L (ref 101–111)
CO2: 29 mmol/L (ref 22–32)
CREATININE: 1.11 mg/dL — AB (ref 0.44–1.00)
Calcium: 9.2 mg/dL (ref 8.9–10.3)
GFR calc non Af Amer: 46 mL/min — ABNORMAL LOW (ref >60)
GFR, EST AFRICAN AMERICAN: 53 mL/min — AB (ref >60)
Glucose, Bld: 134 mg/dL — ABNORMAL HIGH (ref 65–99)
Potassium: 3.9 mmol/L (ref 3.5–5.1)
SODIUM: 141 mmol/L (ref 135–145)

## 2017-10-31 LAB — CBC
HEMATOCRIT: 42.3 % (ref 36.0–46.0)
HEMOGLOBIN: 13.8 g/dL (ref 12.0–15.0)
MCH: 28.2 pg (ref 26.0–34.0)
MCHC: 32.6 g/dL (ref 30.0–36.0)
MCV: 86.3 fL (ref 78.0–100.0)
Platelets: 184 10*3/uL (ref 150–400)
RBC: 4.9 MIL/uL (ref 3.87–5.11)
RDW: 13.7 % (ref 11.5–15.5)
WBC: 7.3 10*3/uL (ref 4.0–10.5)

## 2017-10-31 NOTE — Progress Notes (Signed)
Patient ID: Nicole [name], female   DOB: July 16, 1935, 82 y.o.   MRN: [id number] PCP: [facility]) HF Cardiology: Dr. Aundra Dubin  Nicole [name] is a 82 yo with history of COPD (33 pack years quit April 2014), atrial fibrillation s/p AVR with pericardial valve, mitral annuloplasty, MAZE procedure and clipping of the LA appendage (82/9562) and diastolic CHF. Daughter is Personnel officer ([employer] nurse).   She went back into atrial fibrillation around 5/16.  Given increased symptoms when in atrial fibrillation, she was started her on amiodarone and planned for DCCV after amiodarone load. She went out of atrial fibrillation and into a stable junctional rhythm with rate in the 60s.  She also developed subclinical hyperthyroidism so amiodarone was stopped. She saw an endocrinologist who recommended that she take methimazole.  However, she never started it.  No BRBPR or melena on Xarelto.   She went back into atrial fibrillation again in 1/18.  We decided to give her a trial of DCCV without starting an antiarrhythmic.  DCCV was attempted in 1/18 but failed. She remains in atrial fibrillation today.   Admitted 06/23/17 with increased shortness of breath. Hgb on admit 7. She had been on xarelto. Received 2UPRBCs. EGD and colonoscopy performed. No source of bleeding identified. Discharged back on xarelto but this was stopped and she was switched to eliquis 5 mg twice a day.   Today she returns for HF follow up. Overall feeling fine. SOB with exertion but she nebulizer is broken. Continues to ambulate with a walker. Denies PND/Orthopnea. Appetite ok. No fever or chills. Weight at home has been around 199 pounds. She has had an extra torsemide since the last visit. No BRBPR. Taking all medications.   Labs (9/14): K 4, creatinine 0.9 => 1.04, HCT 33.7, TnI 0.21 (while hospitalized in [place]), BNP 158, TSH normal, AST normal, ALT 39 Labs (03/16/13):  K 3.9 Cr 1.0 Labs (6/16): K 4.3, creatinine 0.99, Hgb 14.6 Labs (1/17): K 4.3,  creatinine 1.08 Labs (2/17): K 3.5, creatinine 1.04, BNP 220, free T3 normal, free T4 high, TSH 0.09, LFTs normal Labs (6/17): K 3.7, creatinine 1.04, TSH/free T3/free T4 normal, LDL 54, HDL 54 Labs (1/18): K 3.9, creatinine 1.08, BNP 129, HCT 40.2 Labs (2/18): K 4.5, creatinine 0.86 Labs (5/18): TSH normal Labs (9/18): K 4, creatinine 1.0 Labs (12/18): K 3.3, creatinine 0.96 Labs (06/24/2017): K 3.8 Creatinine 0.85  Labs (08/23/2017): K 3.8 Creatinine 0.85 Hgb 13   PMH: 1. COPD: PFTs (5/15) with FEV1 61%, RVC 63%, ratio 96%, DLCO 50%.   2. Atrial fibrillation: First noted in 7/14.  DCCV in 8/14 to NSR.  Recurred by 9/14.  DCCV to NSR 9/14 but recurred. S/p Maze procedure 10/14.  Recurrent atrial fibrillation around 5/16, later developed a junctional rhythm on amiodarone and amiodarone stopped.  She was noted to be back in atrial fibrillation in 1/18, failed DCCV in 1/18.  3. Hyperlipidemia 4. Diastolic CHF: Echo (1/30) with EF 55-60%, grade II diastolic dysfunction, mild LVH, moderate to severe AS with mean gradient 33 mmHg/peak 49 mmHg and AVA 0.74 cm^2.  Echo (2/17) with EF 60-65%, mild focal basal septal hypertrophy, bioprosthetic aortic valve appeared normal, s/p mitral valve repair with no significant mitral stenosis.  - Echo (12/18): EF 60-65%, moderate RV dilation with mildly decreased systolic function, peak RV-RA gradient 31 mmHg, normal bioprosthetic aortic valve, normal repaired mitral valve.  5. Severe AS/Moderate MR. 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 Normal coronaries cath 10/14 7. Ventral hernia.  8. Low back pain: Lumbar disc disease.  9. Hyperthyroidism: Subclinical.  Likely related to amiodarone use.  10. Junctional rhythm.   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  Lives alone.  Daughter come to appointments.   FH: No premature CAD.  Breast cancer.   ROS: All systems reviewed  and negative except as per HPI.   Current Outpatient Medications  Medication Sig Dispense Refill  . apixaban (ELIQUIS) 5 MG TABS tablet Take 1 tablet (5 mg total) by mouth 2 (two) times daily. 60 tablet 3  . atorvastatin (LIPITOR) 40 MG tablet TAKE 40 mg  TABLET BY MOUTH in the evening 90 tablet 2  . diltiazem (CARDIZEM CD) 240 MG 24 hr capsule Take 1 capsule (240 mg total) by mouth daily. 30 capsule 3  . DULoxetine (CYMBALTA) 20 MG capsule Take 1 capsule (20 mg total) by mouth daily. 30 capsule 0  . potassium chloride SA (K-DUR,KLOR-CON) 20 MEQ tablet Take 40 mEq by mouth 2 (two) times daily.    Marland Kitchen torsemide (DEMADEX) 20 MG tablet TAKE 4 TABLETS (80MG  TOTAL) BY MOUTH ONCE DAILY 120 tablet 3  . COLCRYS 0.6 MG tablet Take 0.6 mg by mouth once.    Marland Kitchen HYDROcodone-acetaminophen (NORCO/VICODIN) 5-325 MG tablet Take 1-2 tablets by mouth every 6 (six) hours as needed. 10 tablet 0  . ipratropium-albuterol (DUONEB) 0.5-2.5 (3) MG/3ML SOLN Take 3 mLs by nebulization every 6 (six) hours as needed (FOR SHORTNESS OF BREATH-WHEEZING).     . LORazepam (ATIVAN) 1 MG tablet Take 1 tablet (1 mg total) by mouth 3 (three) times daily as needed for anxiety. (Patient taking differently: Take 0.5 mg by mouth 3 (three) times daily as needed for anxiety. ) 15 tablet 0  . Multiple Vitamins-Minerals (MULTIVITAMIN WITH MINERALS) tablet Take 1 tablet by mouth daily.    . naproxen sodium (ALEVE) 220 MG tablet Take 220 mg by mouth daily as needed (for pain).    . VENTOLIN HFA 108 (90 Base) MCG/ACT inhaler Inhale 2 puffs into the lungs as needed.  12   No current facility-administered medications for this encounter.     Vitals:   10/31/17 1404  BP: 118/64  Pulse: 85  SpO2: 95%  Weight: 206 lb 12.8 oz (93.8 kg)   Wt Readings from Last 3 Encounters:  10/31/17 206 lb 12.8 oz (93.8 kg)  08/23/17 208 lb 3.2 oz (94.4 kg)  06/24/17 218 lb 11.2 oz (99.2 kg)    Physical Exam: General:  Elderly. Ambulated in the clinic with  a walker.  No resp difficulty HEENT: normal Neck: supple. JVP 5-6 Carotids 2+ bilat; no bruits. No lymphadenopathy or thryomegaly appreciated. Cor: PMI nondisplaced. Irregular rate & rhythm. No rubs, gallops or murmurs. Lungs: clear Abdomen: soft, nontender, nondistended. No hepatosplenomegaly. No bruits or masses. Good bowel sounds. Extremities: no cyanosis, clubbing, rash, edema Neuro: alert & orientedx3, cranial nerves grossly intact. moves all 4 extremities w/o difficulty. Affect pleasant  Assessment/Plan: 1. Valvular heart disease: Severe aortic stenosis and moderate MR, s/p AVR (tissue) and MV ring with Maze procedure 03/15/13.  - Aortic and mitral valves appeared stable on 12/18 echo 2. Atrial fibrillation: s/p MAZE. She had been on amiodarone to keep her in NSR.  She went into a regular junctional rhythm on amiodarone.  She also developed hyperthyroidism in the setting of amiodarone use. Amiodarone was stopped. She is now in persistent atrial fibrillation and  failed DCCV in 1/18. Rate controlled.   - Off xarelto due to gi bleed.  - Continue eliquis 5 mg twice a day. Check CBC today.   - Given prior MAZE, she is probably not a good atrial fibrillation ablation candidate.  - She will stay off amiodarone.     3. Chronic diastolic CHF:   NYHA III. Volume status stable.  Continue torsemide 80 mg daily.  - Check BMET today.   4. COPD: stable. She no longer is smoking. Sees Dr. Luan Pulling.   5. Hyperthyroidism: Most recent thyroid indices back to normal off amiodarone.   Follow up in 6 months with Dr Aundra Dubin.   Jevon Shells NP-C  10/31/2017

## 2017-10-31 NOTE — Patient Instructions (Signed)
Labs today (will call for abnormal results, otherwise no news is good news)  Follow up with Dr. Aundra Dubin in 6 months.  Please call our clinic in October/November to schedule your appointment.  [phone], Option 3.

## 2017-11-05 ENCOUNTER — Other Ambulatory Visit (HOSPITAL_COMMUNITY): Payer: Self-pay | Admitting: Cardiology

## 2017-12-01 ENCOUNTER — Other Ambulatory Visit (HOSPITAL_COMMUNITY): Payer: Self-pay | Admitting: Internal Medicine

## 2017-12-01 ENCOUNTER — Other Ambulatory Visit (HOSPITAL_COMMUNITY): Payer: Self-pay | Admitting: Cardiology

## 2018-02-28 DIAGNOSIS — H1130 Conjunctival hemorrhage, unspecified eye: Secondary | ICD-10-CM | POA: Diagnosis not present

## 2018-03-03 ENCOUNTER — Other Ambulatory Visit (HOSPITAL_COMMUNITY): Payer: Self-pay | Admitting: Cardiology

## 2018-03-04 ENCOUNTER — Other Ambulatory Visit (HOSPITAL_COMMUNITY): Payer: Self-pay | Admitting: Internal Medicine

## 2018-03-07 ENCOUNTER — Other Ambulatory Visit (HOSPITAL_COMMUNITY): Payer: Self-pay | Admitting: Cardiology

## 2018-03-23 ENCOUNTER — Other Ambulatory Visit (HOSPITAL_COMMUNITY): Payer: Self-pay | Admitting: Cardiology

## 2018-05-07 ENCOUNTER — Other Ambulatory Visit (HOSPITAL_COMMUNITY): Payer: Self-pay | Admitting: Cardiology

## 2018-06-02 ENCOUNTER — Other Ambulatory Visit (HOSPITAL_COMMUNITY): Payer: Self-pay | Admitting: Cardiology

## 2018-06-20 ENCOUNTER — Telehealth (HOSPITAL_COMMUNITY): Payer: Self-pay | Admitting: Cardiology

## 2018-06-20 NOTE — Telephone Encounter (Signed)
Left message for patient to call back.  Need to reschedule appt on 07/07/2018 with Dr. Aundra Dubin.

## 2018-06-22 NOTE — Telephone Encounter (Signed)
Called and left 2nd message for patient to call back.  Need to reschedule 07/07/2018 appt with Dr. Aundra Dubin, 07/05/2018 or 07/06/2018.

## 2018-06-26 NOTE — Telephone Encounter (Signed)
Spoke with patient who stated her daughter is going to call to reschedule today.  Pt's daughter is the one who brings pt to her appts.

## 2018-06-28 NOTE — Telephone Encounter (Signed)
Patient's daughter called to reschedule appt.  She is working and unable to bring pt on 2/5 or 2/6, states pt is not having any problems at this time.  Appt scheduled 07/20/17 with Dr. Aundra Dubin, daughter aware.

## 2018-07-07 ENCOUNTER — Encounter (HOSPITAL_COMMUNITY): Payer: Medicare Other | Admitting: Cardiology

## 2018-07-20 ENCOUNTER — Encounter (HOSPITAL_COMMUNITY): Payer: Self-pay | Admitting: Cardiology

## 2018-07-20 ENCOUNTER — Ambulatory Visit (HOSPITAL_COMMUNITY)
Admission: RE | Admit: 2018-07-20 | Discharge: 2018-07-20 | Disposition: A | Discharge status: Home / Self Care | Payer: Medicare Other | Source: Ambulatory Visit | Attending: Cardiology | Admitting: Cardiology

## 2018-07-20 VITALS — BP 138/84 | HR 88 | Wt 202.8 lb

## 2018-07-20 DIAGNOSIS — E0590 Thyrotoxicosis, unspecified without thyrotoxic crisis or storm: Secondary | ICD-10-CM | POA: Diagnosis not present

## 2018-07-20 DIAGNOSIS — Z79899 Other long term (current) drug therapy: Secondary | ICD-10-CM | POA: Diagnosis not present

## 2018-07-20 DIAGNOSIS — I350 Nonrheumatic aortic (valve) stenosis: Secondary | ICD-10-CM | POA: Insufficient documentation

## 2018-07-20 DIAGNOSIS — I4819 Other persistent atrial fibrillation: Secondary | ICD-10-CM

## 2018-07-20 DIAGNOSIS — Z7901 Long term (current) use of anticoagulants: Secondary | ICD-10-CM | POA: Insufficient documentation

## 2018-07-20 DIAGNOSIS — R0600 Dyspnea, unspecified: Secondary | ICD-10-CM | POA: Insufficient documentation

## 2018-07-20 DIAGNOSIS — J449 Chronic obstructive pulmonary disease, unspecified: Secondary | ICD-10-CM | POA: Insufficient documentation

## 2018-07-20 DIAGNOSIS — R0602 Shortness of breath: Secondary | ICD-10-CM | POA: Insufficient documentation

## 2018-07-20 DIAGNOSIS — E785 Hyperlipidemia, unspecified: Secondary | ICD-10-CM | POA: Diagnosis not present

## 2018-07-20 DIAGNOSIS — M545 Low back pain: Secondary | ICD-10-CM | POA: Diagnosis not present

## 2018-07-20 DIAGNOSIS — I5032 Chronic diastolic (congestive) heart failure: Secondary | ICD-10-CM | POA: Insufficient documentation

## 2018-07-20 LAB — BASIC METABOLIC PANEL
Anion gap: 11 (ref 5–15)
BUN: 12 mg/dL (ref 8–23)
CALCIUM: 9.4 mg/dL (ref 8.9–10.3)
CO2: 26 mmol/L (ref 22–32)
Chloride: 100 mmol/L (ref 98–111)
Creatinine, Ser: 0.95 mg/dL (ref 0.44–1.00)
GFR calc Af Amer: >60 mL/min (ref >60)
GFR calc non Af Amer: 56 mL/min — ABNORMAL LOW (ref >60)
GLUCOSE: 118 mg/dL — AB (ref 70–99)
Potassium: 4 mmol/L (ref 3.5–5.1)
Sodium: 137 mmol/L (ref 135–145)

## 2018-07-20 LAB — CBC
HCT: 44.6 % (ref 36.0–46.0)
Hemoglobin: 14.3 g/dL (ref 12.0–15.0)
MCH: 29.2 pg (ref 26.0–34.0)
MCHC: 32.1 g/dL (ref 30.0–36.0)
MCV: 91 fL (ref 80.0–100.0)
Platelets: 197 10*3/uL (ref 150–400)
RBC: 4.9 MIL/uL (ref 3.87–5.11)
RDW: 12.7 % (ref 11.5–15.5)
WBC: 9.2 10*3/uL (ref 4.0–10.5)
nRBC: 0 % (ref 0.0–0.2)

## 2018-07-20 LAB — LIPID PANEL
Cholesterol: 131 mg/dL (ref 0–200)
HDL: 51 mg/dL (ref >40)
LDL CALC: 54 mg/dL (ref 0–99)
Total CHOL/HDL Ratio: 2.6 RATIO
Triglycerides: 131 mg/dL (ref <150)
VLDL: 26 mg/dL (ref 0–40)

## 2018-07-20 NOTE — Progress Notes (Signed)
Medication Samples have been provided to the patient.  Drug name: Eliquis       Strength: 5mg         Qty: 28  LOT: [id number]  Exp.Date: 6/22  Dosing instructions: 1 tab twice a day  The patient has been instructed regarding the correct time, dose, and frequency of taking this medication, including desired effects and most common side effects.   Nicole [name] 10:50 AM 07/20/2018

## 2018-07-20 NOTE — Progress Notes (Signed)
Patient ID: Nicole [name], female   DOB: 02-Jan-1936, 83 y.o.   MRN: [id number] PCP: [facility]) HF Cardiology: Dr. Aundra Dubin  Nicole [name] is a 83 y.o. with history of COPD (64 pack years quit April 2014), atrial fibrillation s/p AVR with pericardial valve, mitral annuloplasty, MAZE procedure and clipping of the LA appendage (41/3244) and diastolic CHF. Daughter is Personnel officer (CC[employer] nurse).   She went back into atrial fibrillation around 5/16.  Given increased symptoms when in atrial fibrillation, I started her on amiodarone and planned for DCCV after amiodarone load. She went out of atrial fibrillation and into a stable junctional rhythm with rate in the 60s.  She also developed subclinical hyperthyroidism.  I stopped her amiodarone.  She saw an endocrinologist who recommended that she take methimazole.  However, she never started it.  No BRBPR or melena on Xarelto.   She went back into atrial fibrillation again in 1/18.  We decided to give her a trial of DCCV without starting an antiarrhythmic.  DCCV was attempted in 1/18 but failed. She remains in atrial fibrillation today.   She returns today for followup of CHF.  She has stable significant dyspnea.  She is short of breath walking 100 feet though she does ok walking around her house.  Not using oxygen.  No chest pain.  No orthopnea/PND. Weight down 4 lbs.  No falls, no lightheadedness or dyspnea.    Labs (9/14): K 4, creatinine 0.9 => 1.04, HCT 33.7, TnI 0.21 (while hospitalized in [place]), BNP 158, TSH normal, AST normal, ALT 39 Labs (03/16/13):  K 3.9 Cr 1.0 Labs (6/16): K 4.3, creatinine 0.99, Hgb 14.6 Labs (1/17): K 4.3, creatinine 1.08 Labs (2/17): K 3.5, creatinine 1.04, BNP 220, free T3 normal, free T4 high, TSH 0.09, LFTs normal Labs (6/17): K 3.7, creatinine 1.04, TSH/free T3/free T4 normal, LDL 54, HDL 54 Labs (1/18): K 3.9, creatinine 1.08, BNP 129, HCT 40.2 Labs (2/18): K 4.5, creatinine 0.86 Labs (5/18): TSH normal Labs (9/18): K 4,  creatinine 1.0 Labs (12/18): K 3.3, creatinine 0.96 Labs (6/19): hgb 13.8, K 3.9, creatinine 1.11  PMH: 1. COPD: PFTs (5/15) with FEV1 61%, RVC 63%, ratio 96%, DLCO 50%.   2. Atrial fibrillation: First noted in 7/14.  DCCV in 8/14 to NSR.  Recurred by 9/14.  DCCV to NSR 9/14 but recurred. S/p Maze procedure 10/14.  Recurrent atrial fibrillation around 5/16, later developed a junctional rhythm on amiodarone and amiodarone stopped.  She was noted to be back in atrial fibrillation in 1/18, failed DCCV in 1/18.  3. Hyperlipidemia 4. Diastolic CHF: Echo (0/10) with EF 55-60%, grade II diastolic dysfunction, mild LVH, moderate to severe AS with mean gradient 33 mmHg/peak 49 mmHg and AVA 0.74 cm^2.  Echo (2/17) with EF 60-65%, mild focal basal septal hypertrophy, bioprosthetic aortic valve appeared normal, s/p mitral valve repair with no significant mitral stenosis.  - Echo (12/18): EF 60-65%, moderate RV dilation with mildly decreased systolic function, peak RV-RA gradient 31 mmHg, normal bioprosthetic aortic valve, normal repaired mitral valve.  5. Severe AS/Moderate MR. 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 Normal coronaries cath 10/14 7. Ventral hernia.  8. Low back pain: Lumbar disc disease.  9. Hyperthyroidism: Subclinical.  Likely related to amiodarone use.  10. Junctional rhythm.   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  Lives alone.  Daughter come to appointments.   FH:  No premature CAD.  Breast cancer.   ROS: All systems reviewed and negative except as per HPI.   Current Outpatient Medications  Medication Sig Dispense Refill  . atorvastatin (LIPITOR) 40 MG tablet TAKE 1 TABLET BY MOUTH IN THE EVENING 90 tablet 2  . diltiazem (CARDIZEM CD) 240 MG 24 hr capsule TAKE 1 CAPSULE BY MOUTH EVERY DAY 90 capsule 1  . DULoxetine (CYMBALTA) 20 MG capsule Take 1 capsule (20 mg total) by mouth daily. 30  capsule 0  . ELIQUIS 5 MG TABS tablet TAKE 1 TABLET BY MOUTH TWICE A DAY 60 tablet 3  . ipratropium-albuterol (DUONEB) 0.5-2.5 (3) MG/3ML SOLN Take 3 mLs by nebulization every 6 (six) hours as needed (FOR SHORTNESS OF BREATH-WHEEZING).     Marland Kitchen KLOR-CON M20 20 MEQ tablet TAKE 2 TABLETS BY MOUTH IN THE MORNING & 1 TABLET IN THE EVENING 270 tablet 1  . Multiple Vitamins-Minerals (MULTIVITAMIN WITH MINERALS) tablet Take 1 tablet by mouth daily.    . naproxen sodium (ALEVE) 220 MG tablet Take 220 mg by mouth daily as needed (for pain).    . potassium chloride SA (K-DUR,KLOR-CON) 20 MEQ tablet Take 40 mEq by mouth 2 (two) times daily. 52meq in the am and 57meq in the pm    . torsemide (DEMADEX) 20 MG tablet TAKE 4 TABLETS (80MG  TOTAL) BY MOUTH ONCE DAILY 360 tablet 0   No current facility-administered medications for this encounter.     Vitals:   07/20/18 0954  BP: 138/84  Pulse: 88  SpO2: 94%  Weight: 92 kg (202 lb 12.8 oz)   Physical Exam: General: NAD Neck: No JVD, no thyromegaly or thyroid nodule.  Lungs: Distant breath sounds.  CV: Nondisplaced PMI.  Heart regular S1/S2, no S3/S4, 2/6 SEM RUSB.  No peripheral edema.  No carotid bruit.  Normal pedal pulses.  Abdomen: Soft, nontender, no hepatosplenomegaly, no distention.  Skin: Intact without lesions or rashes.  Neurologic: Alert and oriented x 3.  Psych: Normal affect. Extremities: No clubbing or cyanosis.  HEENT: Normal.   Assessment/Plan:  1. Valvular heart disease: Severe aortic stenosis and moderate MR, s/p AVR (tissue) and MV ring with Maze procedure 03/15/13.  - I will get a repeat echo to reassess valves.   2. Atrial fibrillation: s/p MAZE. She had been on amiodarone to keep her in NSR.  She went into a regular junctional rhythm on amiodarone.  She also developed hyperthyroidism in the setting of amiodarone use. Amiodarone was stopped. She is now in persistent atrial fibrillation and failed DCCV in 1/18.   - Continue  anticoagulation with Xarelto.  Most recent data suggests that DOACs should be ok with bioprosthetic valves.  Check CBC today.  - Given prior MAZE, she is probably not a good atrial fibrillation ablation candidate.  - She will stay off amiodarone.  At this point, will continue control/anticoagulation strategy.  3. Chronic diastolic CHF:  NYHA class III symptoms.  I suspect this is mainly due to COPD as she is not volume overloaded on exam.   - Increase torsemide to 80 daily, BMET today.   4. COPD: stable. She no longer is smoking. Sees Dr. Luan Pulling.  5. Hyperthyroidism: Most recent thyroid indices back to normal off amiodarone.   Followup in 6 months.   [name] 07/20/2018

## 2018-07-20 NOTE — Patient Instructions (Signed)
Labs were done today. We will call you with any ABNORMAL results. No news is good news!  Your physician has requested that you have an echocardiogram. Echocardiography is a painless test that uses sound waves to create images of your heart. It provides your doctor with information about the size and shape of your heart and how well your heart's chambers and valves are working. This procedure takes approximately one hour. There are no restrictions for this procedure. This office will call you to schedule an appointment with you.  Your physician wants you to follow-up in: 6 MONTHS You will receive a reminder letter in the mail two months in advance. If you don't receive a letter, please call our office to schedule the follow-up appointment. Please call at the office [phone] towards the end of May if you would like your choice of appointment times.

## 2018-07-22 ENCOUNTER — Other Ambulatory Visit (HOSPITAL_COMMUNITY): Payer: Self-pay | Admitting: Cardiology

## 2018-07-26 ENCOUNTER — Encounter (HOSPITAL_COMMUNITY): Payer: Self-pay

## 2018-07-26 ENCOUNTER — Telehealth (HOSPITAL_COMMUNITY): Payer: Self-pay | Admitting: Licensed Clinical Social Worker

## 2018-07-26 NOTE — Telephone Encounter (Signed)
CSW consulted to discuss Eliquis assistance with patient daughter.    Pt reports pt had to pay $400 the first month and $79 this last month and this has been a lot for her financially.  CSW explained that only assistance option is applying for assistance through [employer].  Patient daughter to pick up application tomorrow and assist patient in filling out.  CSW will continue to follow and assist as needed  Jorge Ny, LCSW Clinical Social Worker [facility] [phone]

## 2018-08-31 ENCOUNTER — Other Ambulatory Visit (HOSPITAL_COMMUNITY): Payer: Self-pay | Admitting: Cardiology

## 2018-09-03 ENCOUNTER — Other Ambulatory Visit (HOSPITAL_COMMUNITY): Payer: Self-pay | Admitting: Cardiology

## 2018-09-18 ENCOUNTER — Other Ambulatory Visit (HOSPITAL_COMMUNITY): Payer: Self-pay | Admitting: Internal Medicine

## 2018-12-04 DIAGNOSIS — K458 Other specified abdominal hernia without obstruction or gangrene: Secondary | ICD-10-CM | POA: Diagnosis not present

## 2018-12-06 DIAGNOSIS — K458 Other specified abdominal hernia without obstruction or gangrene: Secondary | ICD-10-CM | POA: Diagnosis not present

## 2018-12-11 ENCOUNTER — Other Ambulatory Visit (HOSPITAL_COMMUNITY): Payer: Self-pay | Admitting: Cardiology

## 2018-12-29 ENCOUNTER — Other Ambulatory Visit: Payer: Self-pay

## 2019-03-01 ENCOUNTER — Other Ambulatory Visit (HOSPITAL_COMMUNITY): Payer: Self-pay | Admitting: Cardiology

## 2019-03-08 DIAGNOSIS — Z23 Encounter for immunization: Secondary | ICD-10-CM | POA: Diagnosis not present

## 2019-03-20 ENCOUNTER — Other Ambulatory Visit (HOSPITAL_COMMUNITY): Payer: Self-pay | Admitting: Cardiology

## 2019-04-21 ENCOUNTER — Other Ambulatory Visit (HOSPITAL_COMMUNITY): Payer: Self-pay | Admitting: Internal Medicine

## 2019-05-09 DIAGNOSIS — E669 Obesity, unspecified: Secondary | ICD-10-CM | POA: Diagnosis not present

## 2019-05-09 DIAGNOSIS — K439 Ventral hernia without obstruction or gangrene: Secondary | ICD-10-CM | POA: Diagnosis not present

## 2019-05-09 DIAGNOSIS — I4891 Unspecified atrial fibrillation: Secondary | ICD-10-CM | POA: Diagnosis not present

## 2019-05-09 DIAGNOSIS — J449 Chronic obstructive pulmonary disease, unspecified: Secondary | ICD-10-CM | POA: Diagnosis not present

## 2019-06-04 ENCOUNTER — Encounter (HOSPITAL_COMMUNITY): Payer: Self-pay | Admitting: Cardiology

## 2019-06-04 ENCOUNTER — Other Ambulatory Visit: Payer: Self-pay

## 2019-06-04 ENCOUNTER — Telehealth (HOSPITAL_COMMUNITY): Payer: Self-pay | Admitting: Cardiology

## 2019-06-04 ENCOUNTER — Ambulatory Visit (HOSPITAL_COMMUNITY)
Admission: RE | Admit: 2019-06-04 | Discharge: 2019-06-04 | Disposition: A | Discharge status: Home / Self Care | Payer: Medicare Other | Source: Ambulatory Visit | Attending: Cardiology | Admitting: Cardiology

## 2019-06-04 VITALS — BP 121/63 | HR 81 | Wt 182.8 lb

## 2019-06-04 DIAGNOSIS — E0590 Thyrotoxicosis, unspecified without thyrotoxic crisis or storm: Secondary | ICD-10-CM

## 2019-06-04 DIAGNOSIS — I2510 Atherosclerotic heart disease of native coronary artery without angina pectoris: Secondary | ICD-10-CM | POA: Diagnosis not present

## 2019-06-04 DIAGNOSIS — E785 Hyperlipidemia, unspecified: Secondary | ICD-10-CM | POA: Insufficient documentation

## 2019-06-04 DIAGNOSIS — J449 Chronic obstructive pulmonary disease, unspecified: Secondary | ICD-10-CM | POA: Diagnosis not present

## 2019-06-04 DIAGNOSIS — I4819 Other persistent atrial fibrillation: Secondary | ICD-10-CM | POA: Diagnosis not present

## 2019-06-04 DIAGNOSIS — I080 Rheumatic disorders of both mitral and aortic valves: Secondary | ICD-10-CM | POA: Diagnosis not present

## 2019-06-04 DIAGNOSIS — D75839 Thrombocytosis, unspecified: Secondary | ICD-10-CM

## 2019-06-04 DIAGNOSIS — I5032 Chronic diastolic (congestive) heart failure: Secondary | ICD-10-CM | POA: Insufficient documentation

## 2019-06-04 DIAGNOSIS — Z87891 Personal history of nicotine dependence: Secondary | ICD-10-CM | POA: Insufficient documentation

## 2019-06-04 DIAGNOSIS — D473 Essential (hemorrhagic) thrombocythemia: Secondary | ICD-10-CM

## 2019-06-04 DIAGNOSIS — Z79899 Other long term (current) drug therapy: Secondary | ICD-10-CM | POA: Diagnosis not present

## 2019-06-04 DIAGNOSIS — Z7901 Long term (current) use of anticoagulants: Secondary | ICD-10-CM | POA: Insufficient documentation

## 2019-06-04 LAB — CBC
HCT: 45.1 % (ref 36.0–46.0)
Hemoglobin: 14.2 g/dL (ref 12.0–15.0)
MCH: 29.3 pg (ref 26.0–34.0)
MCHC: 31.5 g/dL (ref 30.0–36.0)
MCV: 93 fL (ref 80.0–100.0)
Platelets: 1363 10*3/uL (ref 150–400)
RBC: 4.85 MIL/uL (ref 3.87–5.11)
RDW: 13.5 % (ref 11.5–15.5)
WBC: 10 10*3/uL (ref 4.0–10.5)
nRBC: 0 % (ref 0.0–0.2)

## 2019-06-04 LAB — BASIC METABOLIC PANEL
Anion gap: 13 (ref 5–15)
BUN: 11 mg/dL (ref 8–23)
CO2: 26 mmol/L (ref 22–32)
Calcium: 9.6 mg/dL (ref 8.9–10.3)
Chloride: 103 mmol/L (ref 98–111)
Creatinine, Ser: 1.09 mg/dL — ABNORMAL HIGH (ref 0.44–1.00)
GFR calc Af Amer: 55 mL/min — ABNORMAL LOW (ref >60)
GFR calc non Af Amer: 48 mL/min — ABNORMAL LOW (ref >60)
Glucose, Bld: 119 mg/dL — ABNORMAL HIGH (ref 70–99)
Potassium: 3.4 mmol/L — ABNORMAL LOW (ref 3.5–5.1)
Sodium: 142 mmol/L (ref 135–145)

## 2019-06-04 LAB — LIPID PANEL
Cholesterol: 122 mg/dL (ref 0–200)
HDL: 40 mg/dL — ABNORMAL LOW (ref >40)
LDL Cholesterol: 50 mg/dL (ref 0–99)
Total CHOL/HDL Ratio: 3.1 RATIO
Triglycerides: 162 mg/dL — ABNORMAL HIGH (ref <150)
VLDL: 32 mg/dL (ref 0–40)

## 2019-06-04 LAB — TSH: TSH: 0.344 u[IU]/mL — ABNORMAL LOW (ref 0.350–4.500)

## 2019-06-04 MED ORDER — TORSEMIDE 20 MG PO TABS: 60.0000 mg | ORAL_TABLET | Freq: Every day | ORAL | 3 refills | Status: DC

## 2019-06-04 NOTE — Progress Notes (Signed)
Patient ID: Roselani Vansickel, female   DOB: 11-22-1935, 84 y.o.   MRN: [id number] PCP: [facility]) HF Cardiology: Dr. Aundra Dubin  Ms Fernando is a 84 y.o. with history of COPD (108 pack years quit April 2014), atrial fibrillation s/p AVR with pericardial valve, mitral annuloplasty, MAZE procedure and clipping of the LA appendage (99991111) and diastolic CHF. Daughter is Personnel officer (CCU nurse).   She went back into atrial fibrillation around 5/16.  Given increased symptoms when in atrial fibrillation, I started her on amiodarone and planned for DCCV after amiodarone load. She went out of atrial fibrillation and into a stable junctional rhythm with rate in the 60s.  She also developed subclinical hyperthyroidism.  I stopped her amiodarone.  She saw an endocrinologist who recommended that she take methimazole.  However, she never started it.  No BRBPR or melena on Xarelto.   She went back into atrial fibrillation again in 1/18.  We decided to give her a trial of DCCV without starting an antiarrhythmic.  DCCV was attempted in 1/18 but failed. She remains in atrial fibrillation today.   She returns today for followup of CHF.  She has stable significant dyspnea.  She is short of breath walking about 100 feet.  Not using oxygen.  No chest pain.  No orthopnea/PND.  Occasional dizzy spells/lightheadedness with standing.  Weight is down 20 lbs.    ECG (personally reviewed): atrial fibrillation, nonspecific T wave inversions.   Labs (9/14): K 4, creatinine 0.9 => 1.04, HCT 33.7, TnI 0.21 (while hospitalized in [place]), BNP 158, TSH normal, AST normal, ALT 39 Labs (03/16/13):  K 3.9 Cr 1.0 Labs (6/16): K 4.3, creatinine 0.99, Hgb 14.6 Labs (1/17): K 4.3, creatinine 1.08 Labs (2/17): K 3.5, creatinine 1.04, BNP 220, free T3 normal, free T4 high, TSH 0.09, LFTs normal Labs (6/17): K 3.7, creatinine 1.04, TSH/free T3/free T4 normal, LDL 54, HDL 54 Labs (1/18): K 3.9, creatinine 1.08, BNP 129, HCT 40.2 Labs (2/18): K  4.5, creatinine 0.86 Labs (5/18): TSH normal Labs (9/18): K 4, creatinine 1.0 Labs (12/18): K 3.3, creatinine 0.96 Labs (6/19): hgb 13.8, K 3.9, creatinine 1.11 Labs (2/20): LDL 54, K 4, creatinine 0.95, hgb 14.3  PMH: 1. COPD: PFTs (5/15) with FEV1 61%, RVC 63%, ratio 96%, DLCO 50%.   2. Atrial fibrillation: First noted in 7/14.  DCCV in 8/14 to NSR.  Recurred by 9/14.  DCCV to NSR 9/14 but recurred. S/p Maze procedure 10/14.  Recurrent atrial fibrillation around 5/16, later developed a junctional rhythm on amiodarone and amiodarone stopped.  She was noted to be back in atrial fibrillation in 1/18, failed DCCV in 1/18.  3. Hyperlipidemia 4. Diastolic CHF: Echo (123456) with EF 55-60%, grade II diastolic dysfunction, mild LVH, moderate to severe AS with mean gradient 33 mmHg/peak 49 mmHg and AVA 0.74 cm^2.  Echo (2/17) with EF 60-65%, mild focal basal septal hypertrophy, bioprosthetic aortic valve appeared normal, s/p mitral valve repair with no significant mitral stenosis.  - Echo (12/18): EF 60-65%, moderate RV dilation with mildly decreased systolic function, peak RV-RA gradient 31 mmHg, normal bioprosthetic aortic valve, normal repaired mitral valve.  5. Severe AS/Moderate MR. 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 Normal coronaries cath 10/14 7. Ventral hernia.  8. Low back pain: Lumbar disc disease.  9. Hyperthyroidism: Subclinical.  Likely related to amiodarone use.  10. Junctional rhythm.   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  Lives alone.  Daughter come to appointments.   FH: No premature CAD.  Breast cancer.   ROS: All systems reviewed and negative except as per HPI.   Current Outpatient Medications  Medication Sig Dispense Refill  . albuterol (VENTOLIN HFA) 108 (90 Base) MCG/ACT inhaler     . atorvastatin (LIPITOR) 40 MG tablet TAKE 1 TABLET BY MOUTH EVERY DAY IN THE EVENING 90 tablet 2   . diltiazem (CARDIZEM CD) 240 MG 24 hr capsule TAKE 1 CAPSULE BY MOUTH EVERY DAY 90 capsule 1  . DULoxetine (CYMBALTA) 20 MG capsule Take 1 capsule (20 mg total) by mouth daily. 30 capsule 0  . ELIQUIS 5 MG TABS tablet TAKE 1 TABLET BY MOUTH TWICE A DAY 60 tablet 3  . ferrous sulfate 325 (65 FE) MG EC tablet Take 325 mg by mouth daily with breakfast.    . KLOR-CON M20 20 MEQ tablet TAKE 2 TABLETS BY MOUTH IN THE MORNING, THEN 1 TABLET IN THE EVENING 270 tablet 1  . Multiple Vitamins-Minerals (MULTIVITAMIN WITH MINERALS) tablet Take 1 tablet by mouth daily.    . naproxen sodium (ALEVE) 220 MG tablet Take 220 mg by mouth daily as needed (for pain).    . torsemide (DEMADEX) 20 MG tablet Take 3 tablets (60 mg total) by mouth daily. 270 tablet 3   No current facility-administered medications for this encounter.    Vitals:   06/04/19 1339  BP: 121/63  Pulse: 81  SpO2: 97%  Weight: 82.9 kg (182 lb 12.8 oz)   Physical Exam: General: NAD Neck: No JVD, no thyromegaly or thyroid nodule.  Lungs: Clear to auscultation bilaterally with normal respiratory effort. CV: Nondisplaced PMI.  Heart irregular S1/S2, no S3/S4, 2/6 SEM RUSB.  No peripheral edema.  No carotid bruit.  Normal pedal pulses.  Abdomen: Soft, nontender, no hepatosplenomegaly, no distention.  Skin: Intact without lesions or rashes.  Neurologic: Alert and oriented x 3.  Psych: Normal affect. Extremities: No clubbing or cyanosis.  HEENT: Normal.   Assessment/Plan:  1. Valvular heart disease: Severe aortic stenosis and moderate MR, s/p AVR (tissue) and MV ring with Maze procedure 03/15/13.  - I will arrange for echo to reassess valves.    2. Atrial fibrillation: s/p MAZE. She had been on amiodarone to keep her in NSR.  She went into a regular junctional rhythm on amiodarone.  She also developed hyperthyroidism in the setting of amiodarone use. Amiodarone was stopped. She is now in persistent atrial fibrillation and failed DCCV in  1/18.   - Continue anticoagulation with Eliquis.  Most recent data suggests that DOACs should be ok with bioprosthetic valves.  Check CBC today.  - Given prior MAZE, she is probably not a good atrial fibrillation ablation candidate.  - She will stay off amiodarone.  At this point, will continue control/anticoagulation strategy.  3. Chronic diastolic CHF:  NYHA class III symptoms.  I suspect this is mainly due to COPD as she is not volume overloaded on exam.  With orthostatic-type symptoms at times and 20 lb weight loss, think we can decrease torsemide.  - Decrease torsemide to 60 mg daily, BMET today.   4. COPD: stable. She no longer is smoking. Follows with pulmonary.  5. Hyperthyroidism: Check TSH today.  She is not on methimazole.    Followup in 6 months.   [name] 06/04/2019

## 2019-06-04 NOTE — Telephone Encounter (Signed)
[facility] [phone] (H) Patient aware via daughter [name] (per DPR on file).  Referral placed and orders for repeat lab sent to PCP Thornton Papas, NP [place] [phone]  As requested, OV/labs sent to PCP as well.

## 2019-06-04 NOTE — Telephone Encounter (Signed)
-----   Message from [name], MD sent at 06/04/2019  3:37 PM EST ----- 1. Platelets very high.  Concerned for hematologic malignancy.  Needs repeat CBC to confirm and will need appt with a hematologist ASAP, can refer to Dr. Irene Limbo.  2. TSH is low, suggestive of hyperthyroidism.  When she has repeat CBC, get repeat TSH along with free T3 and free T4.

## 2019-06-04 NOTE — Patient Instructions (Addendum)
DECREASE torsemide to 60mg  (3 tabs) daily  Labs today We will only contact you if something comes back abnormal or we need to make some changes. Otherwise no news is good news!  Your physician has requested that you have an echocardiogram. Echocardiography is a painless test that uses sound waves to create images of your heart. It provides your doctor with information about the size and shape of your heart and how well your heart's chambers and valves are working. This procedure takes approximately one hour. There are no restrictions for this procedure. You will get a call from [facility] to schedule this appointment.  Your physician recommends that you schedule a follow-up appointment in: 6 months with Dr Aundra Dubin, you will get a call to schedule this appointment.   At [facility], you and your health needs are our priority. As part of our continuing mission to provide you with exceptional heart care, we have created designated Provider Care Teams. These Care Teams include your primary Cardiologist (physician) and Advanced Practice Providers (APPs- Physician Assistants and Nurse Practitioners) who all work together to provide you with the care you need, when you need it.   You may see any of the following providers on your designated Care Team at your next follow up: Marland Kitchen Dr Glori Bickers . Dr [name] . Darrick Grinder, NP . Lyda Jester, PA . Audry Riles, PharmD   Please be sure to bring in all your medications bottles to every appointment.

## 2019-06-06 LAB — PATHOLOGIST SMEAR REVIEW

## 2019-06-07 DIAGNOSIS — I10 Essential (primary) hypertension: Secondary | ICD-10-CM | POA: Diagnosis not present

## 2019-06-08 ENCOUNTER — Encounter (HOSPITAL_COMMUNITY): Payer: Self-pay | Admitting: *Deleted

## 2019-06-08 ENCOUNTER — Other Ambulatory Visit: Payer: Self-pay

## 2019-06-11 ENCOUNTER — Inpatient Hospital Stay (HOSPITAL_COMMUNITY): Payer: Medicare Other | Attending: Hematology | Admitting: Hematology

## 2019-06-11 ENCOUNTER — Inpatient Hospital Stay (HOSPITAL_COMMUNITY): Payer: Medicare Other

## 2019-06-11 ENCOUNTER — Other Ambulatory Visit: Payer: Self-pay

## 2019-06-11 ENCOUNTER — Encounter (HOSPITAL_COMMUNITY): Payer: Self-pay | Admitting: Hematology

## 2019-06-11 VITALS — BP 130/53 | HR 80 | Temp 97.9°F | Resp 22 | Wt 182.3 lb

## 2019-06-11 DIAGNOSIS — E785 Hyperlipidemia, unspecified: Secondary | ICD-10-CM | POA: Insufficient documentation

## 2019-06-11 DIAGNOSIS — I5032 Chronic diastolic (congestive) heart failure: Secondary | ICD-10-CM | POA: Diagnosis not present

## 2019-06-11 DIAGNOSIS — Z8249 Family history of ischemic heart disease and other diseases of the circulatory system: Secondary | ICD-10-CM | POA: Diagnosis not present

## 2019-06-11 DIAGNOSIS — Z801 Family history of malignant neoplasm of trachea, bronchus and lung: Secondary | ICD-10-CM | POA: Diagnosis not present

## 2019-06-11 DIAGNOSIS — D75839 Thrombocytosis, unspecified: Secondary | ICD-10-CM | POA: Insufficient documentation

## 2019-06-11 DIAGNOSIS — R410 Disorientation, unspecified: Secondary | ICD-10-CM | POA: Diagnosis not present

## 2019-06-11 DIAGNOSIS — I4891 Unspecified atrial fibrillation: Secondary | ICD-10-CM | POA: Diagnosis not present

## 2019-06-11 DIAGNOSIS — Z952 Presence of prosthetic heart valve: Secondary | ICD-10-CM | POA: Insufficient documentation

## 2019-06-11 DIAGNOSIS — Z90710 Acquired absence of both cervix and uterus: Secondary | ICD-10-CM | POA: Insufficient documentation

## 2019-06-11 DIAGNOSIS — J449 Chronic obstructive pulmonary disease, unspecified: Secondary | ICD-10-CM | POA: Insufficient documentation

## 2019-06-11 DIAGNOSIS — R7989 Other specified abnormal findings of blood chemistry: Secondary | ICD-10-CM | POA: Insufficient documentation

## 2019-06-11 DIAGNOSIS — Z803 Family history of malignant neoplasm of breast: Secondary | ICD-10-CM | POA: Diagnosis not present

## 2019-06-11 DIAGNOSIS — F419 Anxiety disorder, unspecified: Secondary | ICD-10-CM | POA: Insufficient documentation

## 2019-06-11 DIAGNOSIS — Z7901 Long term (current) use of anticoagulants: Secondary | ICD-10-CM | POA: Insufficient documentation

## 2019-06-11 DIAGNOSIS — F17210 Nicotine dependence, cigarettes, uncomplicated: Secondary | ICD-10-CM

## 2019-06-11 DIAGNOSIS — Z79899 Other long term (current) drug therapy: Secondary | ICD-10-CM | POA: Insufficient documentation

## 2019-06-11 DIAGNOSIS — D473 Essential (hemorrhagic) thrombocythemia: Secondary | ICD-10-CM | POA: Diagnosis not present

## 2019-06-11 DIAGNOSIS — R634 Abnormal weight loss: Secondary | ICD-10-CM | POA: Insufficient documentation

## 2019-06-11 DIAGNOSIS — Z808 Family history of malignant neoplasm of other organs or systems: Secondary | ICD-10-CM | POA: Diagnosis not present

## 2019-06-11 DIAGNOSIS — Z87891 Personal history of nicotine dependence: Secondary | ICD-10-CM | POA: Insufficient documentation

## 2019-06-11 LAB — IRON AND TIBC
Iron: 309 ug/dL — ABNORMAL HIGH (ref 28–170)
Saturation Ratios: 68 % — ABNORMAL HIGH (ref 10.4–31.8)
TIBC: 453 ug/dL — ABNORMAL HIGH (ref 250–450)
UIBC: 144 ug/dL

## 2019-06-11 LAB — COMPREHENSIVE METABOLIC PANEL
ALT: 34 U/L (ref 0–44)
AST: 40 U/L (ref 15–41)
Albumin: 4.5 g/dL (ref 3.5–5.0)
Alkaline Phosphatase: 70 U/L (ref 38–126)
Anion gap: 10 (ref 5–15)
BUN: 10 mg/dL (ref 8–23)
CO2: 29 mmol/L (ref 22–32)
Calcium: 10.1 mg/dL (ref 8.9–10.3)
Chloride: 103 mmol/L (ref 98–111)
Creatinine, Ser: 0.99 mg/dL (ref 0.44–1.00)
GFR calc Af Amer: >60 mL/min (ref >60)
GFR calc non Af Amer: 53 mL/min — ABNORMAL LOW (ref >60)
Glucose, Bld: 119 mg/dL — ABNORMAL HIGH (ref 70–99)
Potassium: 3.9 mmol/L (ref 3.5–5.1)
Sodium: 142 mmol/L (ref 135–145)
Total Bilirubin: 1 mg/dL (ref 0.3–1.2)
Total Protein: 7.3 g/dL (ref 6.5–8.1)

## 2019-06-11 LAB — CBC WITH DIFFERENTIAL/PLATELET
Abs Immature Granulocytes: 0.24 10*3/uL — ABNORMAL HIGH (ref 0.00–0.07)
Basophils Absolute: 0.2 10*3/uL — ABNORMAL HIGH (ref 0.0–0.1)
Basophils Relative: 2 %
Eosinophils Absolute: 0.1 10*3/uL (ref 0.0–0.5)
Eosinophils Relative: 1 %
HCT: 46.1 % — ABNORMAL HIGH (ref 36.0–46.0)
Hemoglobin: 14.3 g/dL (ref 12.0–15.0)
Immature Granulocytes: 2 %
Lymphocytes Relative: 13 %
Lymphs Abs: 1.5 10*3/uL (ref 0.7–4.0)
MCH: 29.1 pg (ref 26.0–34.0)
MCHC: 31 g/dL (ref 30.0–36.0)
MCV: 93.7 fL (ref 80.0–100.0)
Monocytes Absolute: 1.1 10*3/uL — ABNORMAL HIGH (ref 0.1–1.0)
Monocytes Relative: 9 %
Neutro Abs: 8.9 10*3/uL — ABNORMAL HIGH (ref 1.7–7.7)
Neutrophils Relative %: 73 %
Platelets: 1535 10*3/uL (ref 150–400)
RBC: 4.92 MIL/uL (ref 3.87–5.11)
RDW: 13.8 % (ref 11.5–15.5)
WBC: 12.1 10*3/uL — ABNORMAL HIGH (ref 4.0–10.5)
nRBC: 0 % (ref 0.0–0.2)

## 2019-06-11 LAB — VITAMIN B12: Vitamin B-12: 292 pg/mL (ref 180–914)

## 2019-06-11 LAB — VITAMIN D 25 HYDROXY (VIT D DEFICIENCY, FRACTURES): Vit D, 25-Hydroxy: 12 ng/mL — ABNORMAL LOW (ref 30–100)

## 2019-06-11 LAB — SEDIMENTATION RATE: Sed Rate: 0 mm/hr (ref 0–22)

## 2019-06-11 LAB — FERRITIN: Ferritin: 24 ng/mL (ref 11–307)

## 2019-06-11 LAB — LACTATE DEHYDROGENASE: LDH: 298 U/L — ABNORMAL HIGH (ref 98–192)

## 2019-06-11 LAB — C-REACTIVE PROTEIN: CRP: 0.5 mg/dL (ref <1.0)

## 2019-06-11 LAB — FOLATE: Folate: 10.9 ng/mL (ref >5.9)

## 2019-06-11 NOTE — Progress Notes (Signed)
CONSULT NOTE  Patient Care Team: Patient, No Pcp Per as PCP - General (General Practice) Sinda Du, MD as Consulting Physician (Pulmonary Disease)  CHIEF COMPLAINTS/PURPOSE OF CONSULTATION: Thrombocytosis  HISTORY OF PRESENTING ILLNESS:  Nicole [name] 84 y.o. female was seen in consultation today for thrombocytosis.  Patient reports she has never had elevated platelets in the past that she has been told.  Her platelets were 1,363 on 06/04/2019.  She denies any new infection or recent hospitalization.  She denies any new antibiotics or steroid use.  She denies any bleeding issues.  Denies any bright red bleeding per rectum or melena.  Denies any flushing or aquagenic pruritus.  She denies any B symptoms.  She does not have a history of any blood clots.  She denies regular alcohol use. Patient was a smoker from age 89 and smoked 1 to 1-1/2 packs/day.  She stopped 5 years ago.  Patient denies any abdominal surgeries or splenectomy. She denies recent chest pain on exertion, shortness of breath on minimal exertion, pre-syncopal episodes, or palpitations.She had not noticed any recent bleeding such as epistaxis, hematuria or hematochezia.  She does report she is on Eliquis daily for AVR.  Patient reports 3 years ago she had a GI bleed related to her taking NSAIDs.  She was hospitalized for blood products and released and has not had a problem since. She had no prior history or diagnosis of cancer.  Her age appropriate screening programs are up-to-date. Her daughter states her confusion has progressively worsened over the past year.  She has had several spells over the past month where she is nauseated randomly.  She only vomited one time.  Patient's daughter reported that she had an episode of confusion on driving to the clinic today where she was unaware of the day and time. This episode only lasted 5 to 7 minutes. Family history includes brother had lung cancer, father throat cancer and daughter  breast cancer.   MEDICAL HISTORY:  Past Medical History:  Diagnosis Date  . Anxiety   . Atrial fibrillation ([place])    with Rapid Ventricular response  . COPD (chronic obstructive pulmonary disease) ([place])   . Diastolic CHF ([place])   . Hyperlipidemia   . SOB (shortness of breath)    conplicated by underlying a fib with RVR,COPD, and CHF    SURGICAL HISTORY: Past Surgical History:  Procedure Laterality Date  . ABDOMINAL HYSTERECTOMY    . AORTIC VALVE REPLACEMENT N/A 03/05/2013   Procedure: AORTIC VALVE REPLACEMENT (AVR);  Surgeon: Gaye Pollack, MD;  Location: [place];  Service: Open Heart Surgery;  Laterality: N/A;  . BREAST BIOPSY     x3  . CARDIOVERSION N/A 02/13/2013   Procedure: CARDIOVERSION;  Surgeon: [name], MD;  Location: [facility] ENDOSCOPY;  Service: Cardiovascular;  Laterality: N/A;  . CARDIOVERSION N/A 03/01/2013   Procedure: CARDIOVERSION;  Surgeon: [name], MD;  Location: [facility] ENDOSCOPY;  Service: Cardiovascular;  Laterality: N/A;  . CARDIOVERSION N/A 06/23/2016   Procedure: CARDIOVERSION;  Surgeon: [name], MD;  Location: [place];  Service: Cardiovascular;  Laterality: N/A;  . CATARACT EXTRACTION    . COLONOSCOPY WITH PROPOFOL N/A 06/24/2017   Procedure: COLONOSCOPY WITH PROPOFOL;  Surgeon: Carol Ada, MD;  Location: [place];  Service: Endoscopy;  Laterality: N/A;  . ESOPHAGOGASTRODUODENOSCOPY N/A 06/24/2017   Procedure: ESOPHAGOGASTRODUODENOSCOPY (EGD);  Surgeon: Carol Ada, MD;  Location: [place];  Service: Endoscopy;  Laterality: N/A;  . INTRAOPERATIVE TRANSESOPHAGEAL ECHOCARDIOGRAM N/A 03/05/2013  Procedure: INTRAOPERATIVE TRANSESOPHAGEAL ECHOCARDIOGRAM;  Surgeon: Gaye Pollack, MD;  Location: [facility] OR;  Service: Open Heart Surgery;  Laterality: N/A;  . LEFT AND RIGHT HEART CATHETERIZATION WITH CORONARY ANGIOGRAM N/A 03/02/2013   Procedure: LEFT AND RIGHT HEART CATHETERIZATION WITH CORONARY ANGIOGRAM;  Surgeon: [name], MD;   Location: [facility] CATH LAB;  Service: Cardiovascular;  Laterality: N/A;  . MAZE N/A 03/05/2013   Procedure: MAZE;  Surgeon: Gaye Pollack, MD;  Location: [place];  Service: Open Heart Surgery;  Laterality: N/A;  . MITRAL VALVE REPAIR N/A 03/05/2013   Procedure: MITRAL VALVE REPAIR (MVR);  Surgeon: Gaye Pollack, MD;  Location: [place];  Service: Open Heart Surgery;  Laterality: N/A;  . ROTATOR CUFF REPAIR    . TEE WITHOUT CARDIOVERSION N/A 03/01/2013   Procedure: TRANSESOPHAGEAL ECHOCARDIOGRAM (TEE);  Surgeon: [name], MD;  Location: [place];  Service: Cardiovascular;  Laterality: N/A;  talked to bev. booking no. is [id number] called trish to verify time  . TONSILLECTOMY      SOCIAL HISTORY: Social History   Socioeconomic History  . Marital status: Divorced    Spouse name: Not on file  . Number of children: 2  . Years of education: Not on file  . Highest education level: Not on file  Occupational History  . Occupation: retired  Tobacco Use  . Smoking status: Former Smoker    Packs/day: 1.00    Years: 57.00    Pack years: 57.00    Types: Cigarettes    Start date: 06/12/1955    Quit date: 11/28/2012    Years since quitting: 6.5  . Smokeless tobacco: Never Used  . Tobacco comment: Quit in July 2014  Substance and Sexual Activity  . Alcohol use: No    Alcohol/week: 0.0 standard drinks  . Drug use: No  . Sexual activity: Never  Other Topics Concern  . Not on file  Social History Narrative  . Not on file   Social Determinants of Health   Financial Resource Strain: Low Risk   . Difficulty of Paying Living Expenses: Not hard at all  Food Insecurity: No Food Insecurity  . Worried About Charity fundraiser in the Last Year: Never true  . Ran Out of Food in the Last Year: Never true  Transportation Needs: No Transportation Needs  . Lack of Transportation (Medical): No  . Lack of Transportation (Non-Medical): No  Physical Activity: Inactive  . Days of Exercise per Week: 0 days   . Minutes of Exercise per Session: 0 min  Stress: No Stress Concern Present  . Feeling of Stress : Not at all  Social Connections: Somewhat Isolated  . Frequency of Communication with Friends and Family: More than three times a week  . Frequency of Social Gatherings with Friends and Family: More than three times a week  . Attends Religious Services: 1 to 4 times per year  . Active Member of Clubs or Organizations: No  . Attends Archivist Meetings: Never  . Marital Status: Divorced  Human resources officer Violence: Not At Risk  . Fear of Current or Ex-Partner: No  . Emotionally Abused: No  . Physically Abused: No  . Sexually Abused: No    FAMILY HISTORY: Family History  Problem Relation Age of Onset  . Breast cancer Other        family history  . Heart disease Mother   . Throat cancer Father   . Heart disease Brother   . Heart attack  Son   . Breast cancer Daughter     ALLERGIES:  has No Known Allergies.  MEDICATIONS:  Current Outpatient Medications  Medication Sig Dispense Refill  . albuterol (VENTOLIN HFA) 108 (90 Base) MCG/ACT inhaler Inhale 2 puffs into the lungs as needed.     Marland Kitchen atorvastatin (LIPITOR) 40 MG tablet TAKE 1 TABLET BY MOUTH EVERY DAY IN THE EVENING 90 tablet 2  . diltiazem (CARDIZEM CD) 240 MG 24 hr capsule TAKE 1 CAPSULE BY MOUTH EVERY DAY 90 capsule 1  . DULoxetine (CYMBALTA) 20 MG capsule Take 1 capsule (20 mg total) by mouth daily. 30 capsule 0  . ELIQUIS 5 MG TABS tablet TAKE 1 TABLET BY MOUTH TWICE A DAY 60 tablet 3  . ferrous sulfate 325 (65 FE) MG EC tablet Take 325 mg by mouth daily with breakfast.    . KLOR-CON M20 20 MEQ tablet TAKE 2 TABLETS BY MOUTH IN THE MORNING, THEN 1 TABLET IN THE EVENING 270 tablet 1  . Multiple Vitamins-Minerals (MULTIVITAMIN WITH MINERALS) tablet Take 1 tablet by mouth daily.    . naproxen sodium (ALEVE) 220 MG tablet Take 220 mg by mouth daily as needed (for pain).    . torsemide (DEMADEX) 20 MG tablet Take 3  tablets (60 mg total) by mouth daily. 270 tablet 3   No current facility-administered medications for this visit.    REVIEW OF SYSTEMS:   Constitutional: Denies fevers, chills or abnormal night sweats Respiratory: Denies cough, dyspnea or wheezes Cardiovascular: Denies palpitation, chest discomfort or lower extremity swelling Gastrointestinal:  Denies nausea, heartburn or change in bowel habits Skin: Denies abnormal skin rashes Lymphatics: Denies new lymphadenopathy or easy bruising Neurological:Denies numbness, tingling or new weaknesses Behavioral/Psych: Mood is stable, no new changes  All other systems were reviewed with the patient and are negative.  PHYSICAL EXAMINATION: ECOG PERFORMANCE STATUS: 1 - Symptomatic but completely ambulatory  Vitals:   06/11/19 1224  BP: (!) 130/53  Pulse: 80  Resp: (!) 22  Temp: 97.9 F (36.6 C)  SpO2: 97%   Filed Weights   06/11/19 1224  Weight: 182 lb 4.8 oz (82.7 kg)    GENERAL:alert, no distress and comfortable.  She is oriented x3. SKIN: skin color, texture, turgor are normal, no rashes or significant lesions NECK: supple, thyroid normal size, non-tender, without nodularity LYMPH:  no palpable lymphadenopathy in the cervical, axillary or inguinal LUNGS: clear to auscultation and percussion with normal breathing effort HEART: regular rate & rhythm and no murmurs and no lower extremity edema ABDOMEN:abdomen soft, non-tender and normal bowel sounds Musculoskeletal:no cyanosis of digits and no clubbing  PSYCH: alert & oriented x 3 with fluent speech NEURO: no focal motor/sensory deficits  LABORATORY DATA:  I have reviewed the data as listed Recent Results (from the past 2160 hour(s))  CBC     Status: Abnormal   Collection Time: 06/04/19  2:07 PM  Result Value Ref Range   WBC 10.0 4.0 - 10.5 K/uL   RBC 4.85 3.87 - 5.11 MIL/uL   Hemoglobin 14.2 12.0 - 15.0 g/dL   HCT 45.1 36.0 - 46.0 %   MCV 93.0 80.0 - 100.0 fL   MCH 29.3 26.0  - 34.0 pg   MCHC 31.5 30.0 - 36.0 g/dL   RDW 13.5 11.5 - 15.5 %   Platelets 1,363 (HH) 150 - 400 K/uL    Comment: REPEATED TO VERIFY PLATELET COUNT CONFIRMED BY SMEAR THIS CRITICAL RESULT HAS VERIFIED AND BEEN CALLED TO C.JEFFERIES RN BY  IMANI MANNING ON 01 04 2021 AT 1532, AND HAS BEEN READ BACK.     nRBC 0.0 0.0 - 0.2 %    Comment: Performed at [facility], [place] [address] [place], [place] [address]  Lipid Profile     Status: Abnormal   Collection Time: 06/04/19  2:07 PM  Result Value Ref Range   Cholesterol 122 0 - 200 mg/dL   Triglycerides 162 (H) <150 mg/dL   HDL 40 (L) >40 mg/dL   Total CHOL/HDL Ratio 3.1 RATIO   VLDL 32 0 - 40 mg/dL   LDL Cholesterol 50 0 - 99 mg/dL    Comment:        Total Cholesterol/HDL:CHD Risk Coronary Heart Disease Risk Table                     Men   Women  1/2 Average Risk   3.4   3.3  Average Risk       5.0   4.4  2 X Average Risk   9.6   7.1  3 X Average Risk  23.4   11.0        Use the calculated Patient Ratio above and the CHD Risk Table to determine the patient's CHD Risk.        ATP III CLASSIFICATION (LDL):  <100     mg/dL   Optimal  100-129  mg/dL   Near or Above                    Optimal  130-159  mg/dL   Borderline  160-189  mg/dL   High  >190     mg/dL   Very High Performed at [facility] [address] [place], [place] [address]   Basic Metabolic Panel (BMET)     Status: Abnormal   Collection Time: 06/04/19  2:07 PM  Result Value Ref Range   Sodium 142 135 - 145 mmol/L   Potassium 3.4 (L) 3.5 - 5.1 mmol/L   Chloride 103 98 - 111 mmol/L   CO2 26 22 - 32 mmol/L   Glucose, Bld 119 (H) 70 - 99 mg/dL   BUN 11 8 - 23 mg/dL   Creatinine, Ser 1.09 (H) 0.44 - 1.00 mg/dL   Calcium 9.6 8.9 - 10.3 mg/dL   GFR calc non Af Amer 48 (L) >60 mL/min   GFR calc Af Amer 55 (L) >60 mL/min   Anion gap 13 5 - 15    Comment: Performed at [facility], [place] [address] [place], [place] [address]  TSH     Status: Abnormal    Collection Time: 06/04/19  2:07 PM  Result Value Ref Range   TSH 0.344 (L) 0.350 - 4.500 uIU/mL    Comment: Performed by a 3rd Generation assay with a functional sensitivity of <=0.01 uIU/mL. Performed at [facility], [place] [address], [place], [place] [address]   Pathologist smear review     Status: None   Collection Time: 06/04/19  2:07 PM  Result Value Ref Range   Path Review Thrombocytosis     Comment: Reviewed by Charolett Bumpers. Jeannie Done, M.D. 06/05/2019 Performed at [facility], [place] [address], [place], [place] [address]   Sedimentation rate     Status: None   Collection Time: 06/11/19  1:46 PM  Result Value Ref Range   Sed Rate 0 0 - 22 mm/hr    Comment: Performed at  [facility], [address], [place], [place] [address]  Comprehensive metabolic panel     Status: Abnormal   Collection Time: 06/11/19  1:46 PM  Result Value Ref Range   Sodium 142 135 - 145 mmol/L   Potassium 3.9 3.5 - 5.1 mmol/L   Chloride 103 98 - 111 mmol/L   CO2 29 22 - 32 mmol/L   Glucose, Bld 119 (H) 70 - 99 mg/dL   BUN 10 8 - 23 mg/dL   Creatinine, Ser 0.99 0.44 - 1.00 mg/dL   Calcium 10.1 8.9 - 10.3 mg/dL   Total Protein 7.3 6.5 - 8.1 g/dL   Albumin 4.5 3.5 - 5.0 g/dL   AST 40 15 - 41 U/L   ALT 34 0 - 44 U/L   Alkaline Phosphatase 70 38 - 126 U/L   Total Bilirubin 1.0 0.3 - 1.2 mg/dL   GFR calc non Af Amer 53 (L) >60 mL/min   GFR calc Af Amer >60 >60 mL/min   Anion gap 10 5 - 15    Comment: Performed at [facility], [address] [place], [place] [address]  CBC with Differential/Platelet     Status: Abnormal   Collection Time: 06/11/19  1:46 PM  Result Value Ref Range   WBC 12.1 (H) 4.0 - 10.5 K/uL   RBC 4.92 3.87 - 5.11 MIL/uL   Hemoglobin 14.3 12.0 - 15.0 g/dL   HCT 46.1 (H) 36.0 - 46.0 %   MCV 93.7 80.0 - 100.0 fL   MCH 29.1 26.0 - 34.0 pg   MCHC 31.0 30.0 - 36.0 g/dL   RDW 13.8 11.5 - 15.5 %   Platelets 1,535 (HH) 150 - 400 K/uL    Comment: REPEATED TO VERIFY PLATELET  COUNT CONFIRMED BY SMEAR THIS CRITICAL RESULT HAS VERIFIED AND BEEN CALLED TO H BRAY BY HILLARY FLYNT ON 01 11 2021 AT 1432, AND HAS BEEN READ BACK.     nRBC 0.0 0.0 - 0.2 %   Neutrophils Relative % 73 %   Neutro Abs 8.9 (H) 1.7 - 7.7 K/uL   Lymphocytes Relative 13 %   Lymphs Abs 1.5 0.7 - 4.0 K/uL   Monocytes Relative 9 %   Monocytes Absolute 1.1 (H) 0.1 - 1.0 K/uL   Eosinophils Relative 1 %   Eosinophils Absolute 0.1 0.0 - 0.5 K/uL   Basophils Relative 2 %   Basophils Absolute 0.2 (H) 0.0 - 0.1 K/uL   Immature Granulocytes 2 %   Abs Immature Granulocytes 0.24 (H) 0.00 - 0.07 K/uL    Comment: Performed at [facility], [address], [place], [place] [address]  Lactate dehydrogenase     Status: Abnormal   Collection Time: 06/11/19  1:46 PM  Result Value Ref Range   LDH 298 (H) 98 - 192 U/L    Comment: Performed at [facility], [address], [place], [place] [address]  C-reactive protein     Status: None   Collection Time: 06/11/19  1:47 PM  Result Value Ref Range   CRP 0.5 <1.0 mg/dL    Comment: Performed at [facility], [address], [place], [place] [address]  Folate     Status: None   Collection Time: 06/11/19  1:47 PM  Result Value Ref Range   Folate 10.9 >5.9 ng/mL    Comment: Performed at [facility], [address] [place], [place] [address]  Vitamin B12     Status: None   Collection Time: 06/11/19  1:47 PM  Result  Value Ref Range   Vitamin B-12 292 180 - 914 pg/mL    Comment: (NOTE) This assay is not validated for testing neonatal or myeloproliferative syndrome specimens for Vitamin B12 levels. Performed at [facility], [address] [place], [place] [address]   Iron and TIBC     Status: Abnormal   Collection Time: 06/11/19  1:47 PM  Result Value Ref Range   Iron 309 (H) 28 - 170 ug/dL   TIBC 453 (H) 250 - 450 ug/dL   Saturation Ratios 68 (H) 10.4 - 31.8 %   UIBC 144 ug/dL    Comment: Performed at [facility], [address] [place], [place] [address]   Ferritin     Status: None   Collection Time: 06/11/19  1:47 PM  Result Value Ref Range   Ferritin 24 11 - 307 ng/mL    Comment: Performed at [facility], [address], [place], [place] [address]    RADIOGRAPHIC STUDIES: I have personally reviewed the radiological images as listed and agreed with the findings in the report.  ASSESSMENT & PLAN:  Thrombocytosis ([place]) 1.  Thrombocytosis: -CBC on 06/04/2019 shows platelet count of 1363.  Hemoglobin and white count were normal. -She denies any recent infections, surgeries, hospitalizations.  No vasomotor symptoms.  No erythromelalgia or aquagenic pruritus. -Never had prior history of thrombosis.  No prior history of splenectomy. -Denies any fevers or night sweats.  However 20 pound weight loss in the last 6 months, part of weight loss was due to patient trying to lose weight. -Repeat CBC on 06/08/2019 in [place] showed platelet count of 1270. -We will repeat her platelet count today.  We will rule out myeloproliferative disorders by checking JAK2 V617F and other mutations.  We will also check for BCR/ABL.  We will also check von Willebrand panel. -Check ESR and CRP and LDH. -Patient's daughter reported that patient had an episode of confusion when she was not oriented to time or place while driving to our clinic.  This episode lasted 5 to 7 minutes. -She is completely normal at this time.  Differential diagnosis includes a TIA.  I have recommended MRI of the brain.  However she is claustrophobic.  We will order CT of the head with and without contrast.  2.  Smoking history: -She has 50+ pack year smoking history, quit 5 years ago. -We will consider low-dose CT scan.   All questions were answered. The patient knows to call the clinic with any problems, questions or concerns.      Derek Jack, MD 06/11/19 5:12 PM

## 2019-06-11 NOTE — Assessment & Plan Note (Signed)
1.  Thrombocytosis: -CBC on 06/04/2019 shows platelet count of 1363.  Hemoglobin and white count were normal. -She denies any recent infections, surgeries, hospitalizations.  No vasomotor symptoms.  No erythromelalgia or aquagenic pruritus. -Never had prior history of thrombosis.  No prior history of splenectomy. -Denies any fevers or night sweats.  However 20 pound weight loss in the last 6 months, part of weight loss was due to patient trying to lose weight. -Repeat CBC on 06/08/2019 in [place] showed platelet count of 1270. -We will repeat her platelet count today.  We will rule out myeloproliferative disorders by checking JAK2 V617F and other mutations.  We will also check for BCR/ABL.  We will also check von Willebrand panel. -Check ESR and CRP and LDH. -Patient's daughter reported that patient had an episode of confusion when she was not oriented to time or place while driving to our clinic.  This episode lasted 5 to 7 minutes. -She is completely normal at this time.  Differential diagnosis includes a TIA.  I have recommended MRI of the brain.  However she is claustrophobic.  We will order CT of the head with and without contrast.  2.  Smoking history: -She has 50+ pack year smoking history, quit 5 years ago. -We will consider low-dose CT scan.

## 2019-06-12 LAB — VON WILLEBRAND ANTIGEN: Von Willebrand Antigen, Plasma: 265 % — ABNORMAL HIGH (ref 50–200)

## 2019-06-13 ENCOUNTER — Ambulatory Visit (HOSPITAL_COMMUNITY): Payer: Medicare Other | Admitting: Hematology

## 2019-06-15 ENCOUNTER — Other Ambulatory Visit (HOSPITAL_COMMUNITY): Payer: Self-pay | Admitting: Cardiology

## 2019-06-18 LAB — BCR-ABL1, CML/ALL, PCR, QUANT
Interpretation (BCRAL):: POSITIVE
b2a2 transcript: 6.36 %
b3a2 transcript: 14.2941 %

## 2019-06-18 LAB — CALR + JAK2 E12-15 + MPL (REFLEXED)

## 2019-06-18 LAB — JAK2 V617F, W REFLEX TO CALR/E12/MPL

## 2019-06-21 ENCOUNTER — Other Ambulatory Visit: Payer: Medicare Other

## 2019-06-24 ENCOUNTER — Other Ambulatory Visit: Payer: Self-pay

## 2019-06-24 ENCOUNTER — Observation Stay (HOSPITAL_COMMUNITY): Payer: Medicare Other

## 2019-06-24 ENCOUNTER — Encounter (HOSPITAL_COMMUNITY): Payer: Self-pay | Admitting: Emergency Medicine

## 2019-06-24 ENCOUNTER — Emergency Department (HOSPITAL_COMMUNITY): Payer: Medicare Other

## 2019-06-24 ENCOUNTER — Inpatient Hospital Stay (HOSPITAL_COMMUNITY)
Admission: EM | Admit: 2019-06-24 | Discharge: 2019-06-26 | DRG: 308 | Disposition: A | Discharge status: Home / Self Care | Payer: Medicare Other | Attending: Internal Medicine | Admitting: Internal Medicine

## 2019-06-24 DIAGNOSIS — I509 Heart failure, unspecified: Secondary | ICD-10-CM | POA: Diagnosis not present

## 2019-06-24 DIAGNOSIS — I4821 Permanent atrial fibrillation: Secondary | ICD-10-CM | POA: Diagnosis not present

## 2019-06-24 DIAGNOSIS — Z6836 Body mass index (BMI) 36.0-36.9, adult: Secondary | ICD-10-CM

## 2019-06-24 DIAGNOSIS — D473 Essential (hemorrhagic) thrombocythemia: Secondary | ICD-10-CM

## 2019-06-24 DIAGNOSIS — R9082 White matter disease, unspecified: Secondary | ICD-10-CM | POA: Diagnosis not present

## 2019-06-24 DIAGNOSIS — Z7951 Long term (current) use of inhaled steroids: Secondary | ICD-10-CM

## 2019-06-24 DIAGNOSIS — Z20822 Contact with and (suspected) exposure to covid-19: Secondary | ICD-10-CM | POA: Diagnosis present

## 2019-06-24 DIAGNOSIS — I5033 Acute on chronic diastolic (congestive) heart failure: Secondary | ICD-10-CM | POA: Diagnosis not present

## 2019-06-24 DIAGNOSIS — R0600 Dyspnea, unspecified: Secondary | ICD-10-CM | POA: Diagnosis present

## 2019-06-24 DIAGNOSIS — Z79899 Other long term (current) drug therapy: Secondary | ICD-10-CM

## 2019-06-24 DIAGNOSIS — Z90710 Acquired absence of both cervix and uterus: Secondary | ICD-10-CM | POA: Diagnosis not present

## 2019-06-24 DIAGNOSIS — Q8909 Congenital malformations of spleen: Secondary | ICD-10-CM

## 2019-06-24 DIAGNOSIS — Z953 Presence of xenogenic heart valve: Secondary | ICD-10-CM

## 2019-06-24 DIAGNOSIS — E785 Hyperlipidemia, unspecified: Secondary | ICD-10-CM | POA: Diagnosis present

## 2019-06-24 DIAGNOSIS — D72829 Elevated white blood cell count, unspecified: Secondary | ICD-10-CM | POA: Diagnosis present

## 2019-06-24 DIAGNOSIS — J441 Chronic obstructive pulmonary disease with (acute) exacerbation: Secondary | ICD-10-CM | POA: Diagnosis not present

## 2019-06-24 DIAGNOSIS — R0602 Shortness of breath: Secondary | ICD-10-CM | POA: Diagnosis present

## 2019-06-24 DIAGNOSIS — G9340 Encephalopathy, unspecified: Secondary | ICD-10-CM | POA: Diagnosis not present

## 2019-06-24 DIAGNOSIS — E669 Obesity, unspecified: Secondary | ICD-10-CM | POA: Diagnosis present

## 2019-06-24 DIAGNOSIS — K439 Ventral hernia without obstruction or gangrene: Secondary | ICD-10-CM | POA: Diagnosis present

## 2019-06-24 DIAGNOSIS — E6609 Other obesity due to excess calories: Secondary | ICD-10-CM

## 2019-06-24 DIAGNOSIS — D75839 Thrombocytosis, unspecified: Secondary | ICD-10-CM

## 2019-06-24 DIAGNOSIS — J9601 Acute respiratory failure with hypoxia: Secondary | ICD-10-CM | POA: Diagnosis not present

## 2019-06-24 DIAGNOSIS — E0500 Thyrotoxicosis with diffuse goiter without thyrotoxic crisis or storm: Secondary | ICD-10-CM | POA: Diagnosis present

## 2019-06-24 DIAGNOSIS — E049 Nontoxic goiter, unspecified: Secondary | ICD-10-CM | POA: Diagnosis not present

## 2019-06-24 DIAGNOSIS — Z7901 Long term (current) use of anticoagulants: Secondary | ICD-10-CM

## 2019-06-24 DIAGNOSIS — I350 Nonrheumatic aortic (valve) stenosis: Secondary | ICD-10-CM | POA: Diagnosis present

## 2019-06-24 DIAGNOSIS — Z87891 Personal history of nicotine dependence: Secondary | ICD-10-CM

## 2019-06-24 LAB — CBC
HCT: 35.7 % — ABNORMAL LOW (ref 36.0–46.0)
Hemoglobin: 11 g/dL — ABNORMAL LOW (ref 12.0–15.0)
MCH: 29.8 pg (ref 26.0–34.0)
MCHC: 30.8 g/dL (ref 30.0–36.0)
MCV: 96.7 fL (ref 80.0–100.0)
Platelets: 1680 10*3/uL (ref 150–400)
RBC: 3.69 MIL/uL — ABNORMAL LOW (ref 3.87–5.11)
RDW: 15.4 % (ref 11.5–15.5)
WBC: 15.5 10*3/uL — ABNORMAL HIGH (ref 4.0–10.5)
nRBC: 0.3 % — ABNORMAL HIGH (ref 0.0–0.2)

## 2019-06-24 LAB — BASIC METABOLIC PANEL
Anion gap: 9 (ref 5–15)
BUN: 11 mg/dL (ref 8–23)
CO2: 24 mmol/L (ref 22–32)
Calcium: 9.1 mg/dL (ref 8.9–10.3)
Chloride: 106 mmol/L (ref 98–111)
Creatinine, Ser: 0.98 mg/dL (ref 0.44–1.00)
GFR calc Af Amer: >60 mL/min (ref >60)
GFR calc non Af Amer: 54 mL/min — ABNORMAL LOW (ref >60)
Glucose, Bld: 115 mg/dL — ABNORMAL HIGH (ref 70–99)
Potassium: 4.3 mmol/L (ref 3.5–5.1)
Sodium: 139 mmol/L (ref 135–145)

## 2019-06-24 LAB — TROPONIN I (HIGH SENSITIVITY)
Troponin I (High Sensitivity): 10 ng/L (ref <18)
Troponin I (High Sensitivity): 9 ng/L (ref <18)

## 2019-06-24 LAB — BRAIN NATRIURETIC PEPTIDE: B Natriuretic Peptide: 527 pg/mL — ABNORMAL HIGH (ref 0.0–100.0)

## 2019-06-24 LAB — POC SARS CORONAVIRUS 2 AG -  ED: SARS Coronavirus 2 Ag: NEGATIVE

## 2019-06-24 LAB — SARS CORONAVIRUS 2 (TAT 6-24 HRS): SARS Coronavirus 2: NEGATIVE

## 2019-06-24 LAB — MAGNESIUM: Magnesium: 2.3 mg/dL (ref 1.7–2.4)

## 2019-06-24 MED ORDER — FLUTICASONE FUROATE-VILANTEROL 100-25 MCG/INH IN AEPB
1.0000 | INHALATION_SPRAY | Freq: Every day | RESPIRATORY_TRACT | Status: DC
  Administered 2019-06-24 – 2019-06-26 (×3): 1 via RESPIRATORY_TRACT

## 2019-06-24 MED ORDER — IPRATROPIUM-ALBUTEROL 0.5-2.5 (3) MG/3ML IN SOLN: 3.0000 mL | RESPIRATORY_TRACT | Status: DC | PRN

## 2019-06-24 MED ORDER — ONDANSETRON HCL 4 MG PO TABS: 4.0000 mg | ORAL_TABLET | Freq: Four times a day (QID) | ORAL | Status: DC | PRN

## 2019-06-24 MED ORDER — ALBUTEROL SULFATE (2.5 MG/3ML) 0.083% IN NEBU: 3.0000 mL | INHALATION_SOLUTION | RESPIRATORY_TRACT | Status: DC | PRN

## 2019-06-24 MED ORDER — ACETAMINOPHEN 650 MG RE SUPP: 650.0000 mg | Freq: Four times a day (QID) | RECTAL | Status: DC | PRN

## 2019-06-24 MED ORDER — APIXABAN 5 MG PO TABS
5.0000 mg | ORAL_TABLET | Freq: Two times a day (BID) | ORAL | Status: DC
  Administered 2019-06-24 – 2019-06-26 (×4): 5 mg via ORAL
  Filled 2019-06-24 (×4): qty 1

## 2019-06-24 MED ORDER — DILTIAZEM HCL ER COATED BEADS 120 MG PO CP24
240.0000 mg | ORAL_CAPSULE | Freq: Every day | ORAL | Status: DC
  Administered 2019-06-25 – 2019-06-26 (×2): 240 mg via ORAL
  Filled 2019-06-24 (×2): qty 2

## 2019-06-24 MED ORDER — SODIUM CHLORIDE 0.9 % IV SOLN
100.0000 mg | Freq: Two times a day (BID) | INTRAVENOUS | Status: DC
  Administered 2019-06-24 – 2019-06-25 (×2): 100 mg via INTRAVENOUS
  Filled 2019-06-24 (×5): qty 100

## 2019-06-24 MED ORDER — METOPROLOL TARTRATE 5 MG/5ML IV SOLN: 5.0000 mg | INTRAVENOUS | Status: DC | PRN

## 2019-06-24 MED ORDER — IOHEXOL 350 MG/ML SOLN
100.0000 mL | Freq: Once | INTRAVENOUS | Status: AC | PRN
  Administered 2019-06-24: 100 mL via INTRAVENOUS

## 2019-06-24 MED ORDER — POLYETHYLENE GLYCOL 3350 17 G PO PACK: 17.0000 g | PACK | Freq: Every day | ORAL | Status: DC | PRN

## 2019-06-24 MED ORDER — METHYLPREDNISOLONE SODIUM SUCC 125 MG IJ SOLR
60.0000 mg | Freq: Two times a day (BID) | INTRAMUSCULAR | Status: DC
  Administered 2019-06-25: 60 mg via INTRAVENOUS
  Filled 2019-06-24: qty 2

## 2019-06-24 MED ORDER — DILTIAZEM HCL 25 MG/5ML IV SOLN
10.0000 mg | Freq: Once | INTRAVENOUS | Status: AC
  Administered 2019-06-24: 10 mg via INTRAVENOUS
  Filled 2019-06-24: qty 5

## 2019-06-24 MED ORDER — DULOXETINE HCL 20 MG PO CPEP
20.0000 mg | ORAL_CAPSULE | Freq: Every day | ORAL | Status: DC
  Administered 2019-06-25 – 2019-06-26 (×2): 20 mg via ORAL
  Filled 2019-06-24 (×2): qty 1

## 2019-06-24 MED ORDER — FLUTICASONE FUROATE-VILANTEROL 100-25 MCG/INH IN AEPB
INHALATION_SPRAY | RESPIRATORY_TRACT | Status: AC
  Filled 2019-06-24: qty 28

## 2019-06-24 MED ORDER — METHYLPREDNISOLONE SODIUM SUCC 125 MG IJ SOLR
125.0000 mg | Freq: Once | INTRAMUSCULAR | Status: AC
  Administered 2019-06-24: 125 mg via INTRAVENOUS
  Filled 2019-06-24: qty 2

## 2019-06-24 MED ORDER — ACETAMINOPHEN 325 MG PO TABS: 650.0000 mg | ORAL_TABLET | Freq: Four times a day (QID) | ORAL | Status: DC | PRN

## 2019-06-24 MED ORDER — ONDANSETRON HCL 4 MG/2ML IJ SOLN: 4.0000 mg | Freq: Four times a day (QID) | INTRAMUSCULAR | Status: DC | PRN

## 2019-06-24 MED ORDER — ATORVASTATIN CALCIUM 40 MG PO TABS
40.0000 mg | ORAL_TABLET | Freq: Every day | ORAL | Status: DC
  Administered 2019-06-25: 40 mg via ORAL
  Filled 2019-06-24: qty 1

## 2019-06-24 MED ORDER — ALBUTEROL SULFATE HFA 108 (90 BASE) MCG/ACT IN AERS
4.0000 | INHALATION_SPRAY | Freq: Once | RESPIRATORY_TRACT | Status: AC
  Administered 2019-06-24: 4 via RESPIRATORY_TRACT
  Filled 2019-06-24: qty 6.7

## 2019-06-24 MED ORDER — TORSEMIDE 20 MG PO TABS
60.0000 mg | ORAL_TABLET | Freq: Every day | ORAL | Status: DC
  Administered 2019-06-25 – 2019-06-26 (×2): 60 mg via ORAL
  Filled 2019-06-24 (×2): qty 3

## 2019-06-24 MED ORDER — FUROSEMIDE 10 MG/ML IJ SOLN
40.0000 mg | Freq: Once | INTRAMUSCULAR | Status: AC
  Administered 2019-06-24: 40 mg via INTRAVENOUS
  Filled 2019-06-24: qty 4

## 2019-06-24 NOTE — H&P (Addendum)
History and Physical    Nicole [name] [id number] DOB: April 10, 1936 DOA: 06/24/2019  PCP: Patient, No Pcp Per   Patient coming from: Home  I have personally briefly reviewed patient's old medical records in [place]  Chief Complaint: Shortness of breath  HPI: Nicole [name] is a 84 y.o. female with medical history significant for COPD, atria fibrillation, CHF.  Daughter is present at bedside and helps with history.  Presented to the ED with c/o difficulty breathing over the past 2 days, symptom is worse with activity and improves with rest.  She denies cough.  No wheezing.  No fever no chills.  No chest pain. She reports compliance with her medications including Eliquis, torsemide which was recently reduced from 80 mg to 60 mg daily.  No lower extremity swelling.  Reports intentional weight loss over the past year.  06/11/2019, patient was on her way for her oncology visit, with her daughter when she had a 5-7 4-minute episode of confusion.  Patient was able to talk clearly, but was disoriented, unable to tell the year.  Most of this had resolved by the time she arrived at her oncologist office.  There was no extremity weakness, no facial asymmetry noted.  Head CT was planned as outpatient, and is pending.  ED Course: Tachycardic 120s- 140s in the ED, improved with 10 mg Cardizem.  Blood pressure systolic [id number].  BNP elevated at 527.  EKG showing atrial fibrillation rate 130s.  CTA chest negative for PE shows tiny bilateral pleural effusions and mild dependent right basilar atelectasis.  IV Lasix 40 mg x 1 given, Solu-Medrol 125 mg given.  Hospitalist to admit for further evaluation and management.  Review of Systems: As per HPI all other systems reviewed and negative.  Past Medical History:  Diagnosis Date  . Anxiety   . Atrial fibrillation ([place])    with Rapid Ventricular response  . COPD (chronic obstructive pulmonary disease) ([place])   . Diastolic CHF ([place])   . Hyperlipidemia   .  SOB (shortness of breath)    conplicated by underlying a fib with RVR,COPD, and CHF    Past Surgical History:  Procedure Laterality Date  . ABDOMINAL HYSTERECTOMY    . AORTIC VALVE REPLACEMENT N/A 03/05/2013   Procedure: AORTIC VALVE REPLACEMENT (AVR);  Surgeon: Gaye Pollack, MD;  Location: [place];  Service: Open Heart Surgery;  Laterality: N/A;  . BREAST BIOPSY     x3  . CARDIOVERSION N/A 02/13/2013   Procedure: CARDIOVERSION;  Surgeon: [name], MD;  Location: [facility] ENDOSCOPY;  Service: Cardiovascular;  Laterality: N/A;  . CARDIOVERSION N/A 03/01/2013   Procedure: CARDIOVERSION;  Surgeon: [name], MD;  Location: [facility] ENDOSCOPY;  Service: Cardiovascular;  Laterality: N/A;  . CARDIOVERSION N/A 06/23/2016   Procedure: CARDIOVERSION;  Surgeon: [name], MD;  Location: [place];  Service: Cardiovascular;  Laterality: N/A;  . CATARACT EXTRACTION    . COLONOSCOPY WITH PROPOFOL N/A 06/24/2017   Procedure: COLONOSCOPY WITH PROPOFOL;  Surgeon: Carol Ada, MD;  Location: [place];  Service: Endoscopy;  Laterality: N/A;  . ESOPHAGOGASTRODUODENOSCOPY N/A 06/24/2017   Procedure: ESOPHAGOGASTRODUODENOSCOPY (EGD);  Surgeon: Carol Ada, MD;  Location: [place];  Service: Endoscopy;  Laterality: N/A;  . INTRAOPERATIVE TRANSESOPHAGEAL ECHOCARDIOGRAM N/A 03/05/2013   Procedure: INTRAOPERATIVE TRANSESOPHAGEAL ECHOCARDIOGRAM;  Surgeon: Gaye Pollack, MD;  Location: [place] [facility];  Service: Open Heart Surgery;  Laterality: N/A;  . LEFT AND RIGHT HEART CATHETERIZATION WITH CORONARY ANGIOGRAM N/A 03/02/2013   Procedure: LEFT  AND RIGHT HEART CATHETERIZATION WITH CORONARY ANGIOGRAM;  Surgeon: [name], MD;  Location: [facility] CATH LAB;  Service: Cardiovascular;  Laterality: N/A;  . MAZE N/A 03/05/2013   Procedure: MAZE;  Surgeon: Gaye Pollack, MD;  Location: [place];  Service: Open Heart Surgery;  Laterality: N/A;  . MITRAL VALVE REPAIR N/A 03/05/2013   Procedure: MITRAL VALVE REPAIR (MVR);   Surgeon: Gaye Pollack, MD;  Location: [place];  Service: Open Heart Surgery;  Laterality: N/A;  . ROTATOR CUFF REPAIR    . TEE WITHOUT CARDIOVERSION N/A 03/01/2013   Procedure: TRANSESOPHAGEAL ECHOCARDIOGRAM (TEE);  Surgeon: [name], MD;  Location: [place];  Service: Cardiovascular;  Laterality: N/A;  talked to bev. booking no. is [id number] called trish to verify time  . TONSILLECTOMY       reports that she quit smoking about 6 years ago. Her smoking use included cigarettes. She started smoking about 64 years ago. She has a 57.00 pack-year smoking history. She has never used smokeless tobacco. She reports that she does not drink alcohol or use drugs.  No Known Allergies  Family History  Problem Relation Age of Onset  . Breast cancer Other        family history  . Heart disease Mother   . Throat cancer Father   . Heart disease Brother   . Heart attack Son   . Breast cancer Daughter     Prior to Admission medications   Medication Sig Start Date End Date Taking? Authorizing Provider  albuterol (VENTOLIN HFA) 108 (90 Base) MCG/ACT inhaler Inhale 2 puffs into the lungs as needed.  05/09/19  Yes [provider]  atorvastatin (LIPITOR) 40 MG tablet TAKE 1 TABLET BY MOUTH EVERY DAY IN THE EVENING 03/01/19  Yes McLean, Elby Showers, MD  BREO ELLIPTA 100-25 MCG/INH AEPB Inhale 1 puff into the lungs daily. 06/20/19  Yes [provider]  diltiazem (CARDIZEM CD) 240 MG 24 hr capsule TAKE 1 CAPSULE BY MOUTH EVERY DAY 03/01/19  Yes [name], MD  DULoxetine (CYMBALTA) 20 MG capsule Take 1 capsule (20 mg total) by mouth daily. 06/24/17  Yes Nedrud, Larena Glassman, MD  ELIQUIS 5 MG TABS tablet TAKE 1 TABLET BY MOUTH TWICE A DAY 03/21/19  Yes [name], MD  ferrous sulfate 325 (65 FE) MG EC tablet Take 325 mg by mouth daily with breakfast.   Yes [provider]  KLOR-CON M20 20 MEQ tablet TAKE 2 TABLETS BY MOUTH IN THE MORNING, THEN 1 TABLET IN THE EVENING 04/23/19   Yes Bensimhon, Shaune Pascal, MD  Multiple Vitamins-Minerals (MULTIVITAMIN WITH MINERALS) tablet Take 1 tablet by mouth daily.   Yes [provider]  naproxen sodium (ALEVE) 220 MG tablet Take 220 mg by mouth daily as needed (for pain).   Yes [provider]  torsemide (DEMADEX) 20 MG tablet Take 3 tablets (60 mg total) by mouth daily. 06/04/19  Yes [name], MD    Physical Exam: Vitals:   06/24/19 1415 06/24/19 1500 06/24/19 1530 06/24/19 1700  BP:  97/74 102/71 100/76  Pulse:  (!) 41 91 97  Resp: (!) 26 (!) 27 (!) 26 (!) 35  Temp:      TempSrc:      SpO2: 100% 95% 97% 95%  Weight:      Height:        Constitutional: NAD, calm, comfortable Vitals:   06/24/19 1415 06/24/19 1500 06/24/19 1530 06/24/19 1700  BP:  97/74 102/71  100/76  Pulse:  (!) 41 91 97  Resp: (!) 26 (!) 27 (!) 26 (!) 35  Temp:      TempSrc:      SpO2: 100% 95% 97% 95%  Weight:      Height:       Eyes: PERRL, lids and conjunctivae normal ENMT: Mucous membranes are moist. Posterior pharynx clear of any exudate or lesions.Normal dentition.  Neck: normal, supple, no masses, no thyromegaly Respiratory: Mild increased work of breathing evident when talking, breathing through pursed lips, diffuse decreased air entry bilaterally, no wheezing or crackles appreciated crackles. No accessory muscle use.  Cardiovascular: Tachycardic, irregular rate and rhythm, no murmurs / rubs / gallops. No extremity edema. 2+ pedal pulses.  Abdomen: Large epigastric hernia, easily reducible, no tenderness, no masses palpated. No hepatosplenomegaly. Bowel sounds positive.  Musculoskeletal: no clubbing / cyanosis. No joint deformity upper and lower extremities. Good ROM, no contractures. Normal muscle tone.  Skin: no rashes, lesions, ulcers. No induration Neurologic: CN 2-12 grossly intact. Sensation intact, DTR normal. Strength 5/5 in all 4.  Psychiatric: Normal judgment and insight. Alert and oriented x 3. Normal mood.    Labs on Admission: I have personally reviewed following labs and imaging studies  CBC: Recent Labs  Lab 06/24/19 1100  WBC 15.5*  HGB 11.0*  HCT 35.7*  MCV 96.7  PLT [id number]*   Basic Metabolic Panel: Recent Labs  Lab 06/24/19 1100  NA 139  K 4.3  CL 106  CO2 24  GLUCOSE 115*  BUN 11  CREATININE 0.98  CALCIUM 9.1   Urine analysis:    Component Value Date/Time   COLORURINE STRAW (A) 02/09/2017 1330   APPEARANCEUR CLEAR 02/09/2017 1330   LABSPEC 1.006 02/09/2017 1330   PHURINE 7.0 02/09/2017 1330   GLUCOSEU NEGATIVE 02/09/2017 1330   HGBUR NEGATIVE 02/09/2017 1330   BILIRUBINUR NEGATIVE 02/09/2017 1330   KETONESUR NEGATIVE 02/09/2017 1330   PROTEINUR NEGATIVE 02/09/2017 1330   UROBILINOGEN 1.0 03/04/2013 1230   NITRITE NEGATIVE 02/09/2017 1330   LEUKOCYTESUR NEGATIVE 02/09/2017 1330    Radiological Exams on Admission: CT Angio Chest PE W and/or Wo Contrast  Result Date: 06/24/2019 CLINICAL DATA:  Shortness of breath for 2 days, abdominal pain, atrial fibrillation, COPD, CHF, post AVR/MVR, COVID negative EXAM: CT ANGIOGRAPHY CHEST WITH CONTRAST TECHNIQUE: Multidetector CT imaging of the chest was performed using the standard protocol during bolus administration of intravenous contrast. Multiplanar CT image reconstructions and MIPs were obtained to evaluate the vascular anatomy. CONTRAST:  15mL OMNIPAQUE IOHEXOL 350 MG/ML SOLN IV COMPARISON:  None FINDINGS: Cardiovascular: Atherosclerotic calcifications aorta, tortuous proximal great vessels, and coronary arteries. Post LEFT atrial appendage clipping. Aorta normal caliber without aneurysm or dissection. Enlargement of cardiac chambers. No pericardial effusion. Pulmonary arteries adequately opacified and patent. No evidence of pulmonary embolism. Mediastinum/Nodes: Diffuse enlargement of the LEFT thyroid lobe with enlarged lobe versus mass 4.6 x 3.2 cm extending retro manubrial. Additional calcified nodule LEFT lobe 13 mm  diameter. Few mildly prominent mediastinal lymph nodes up to 13 mm short axis RIGHT paratracheal image 31 and 13 mm precarinal image 34. Esophagus unremarkable. Lungs/Pleura: Tiny BILATERAL pleural effusions with dependent atelectasis posterior RIGHT lower lobe. Remaining lungs clear. No acute infiltrate or pneumothorax. Kyphosis Upper Abdomen: Large ventral hernia in epigastrium measuring up to 7.9 cm transverse and 8.2 cm craniocaudal. Herniation of gastric antrum and transverse colon into hernia sac without obstruction. Question lobulation of the anterior spleen versus isodense splenic mass 6 cm diameter. Musculoskeletal:  Kyphosis of thoracic spine with scattered degenerative disc disease changes. Post median sternotomy. No acute osseous findings. Review of the MIP images confirms the above findings. IMPRESSION: No evidence of pulmonary embolism. Tiny BILATERAL pleural effusions and mild dependent RIGHT basilar atelectasis. Diffuse enlargement of LEFT thyroid lobe extending retro manubrial with goiter versus 4.6 x 3.2 cm diameter inferior LEFT thyroid lobe mass; recommend non emergent ultrasound characterization. Large epigastric ventral hernia containing gastric antrum and transverse colon without obstruction. Extensive atherosclerotic calcifications including coronary arteries with postsurgical changes of AVR and MVR as well as Maze procedure. Suspect lobulated spleen but unable to completely exclude isodense anterior splenic mass; recommend follow-up ultrasound characterization. Aortic Atherosclerosis (ICD10-I70.0). Electronically Signed   By: Lavonia Dana M.D.   On: 06/24/2019 14:36   DG Chest Port 1 View  Result Date: 06/24/2019 CLINICAL DATA:  Shortness of breath, dyspnea EXAM: PORTABLE CHEST 1 VIEW COMPARISON:  02/09/2017 FINDINGS: Mild patchy opacity at the lateral right base. Very mild patchy opacity in the left mid lung. No pleural effusion or pneumothorax. Cardiomegaly.  Prosthetic valve.  Thoracic  aortic atherosclerosis. Median sternotomy. IMPRESSION: Mild patchy opacities in the left upper lobe and right lower lobe, nonspecific but raising the possibility of mild infection. Electronically Signed   By: Julian Hy M.D.   On: 06/24/2019 11:26    EKG: Independently reviewed.  Atrial fibrillation rate 130.  Assessment/Plan Active Problems:   Dyspnea    Uncontrolled atrial fibrillation-rates 120s to 140s at rest, improvement with 10 mg Cardizem.  Reports compliance with Cardizem 240 mg daily.  Blood pressure currently soft with systolic [id number].  Potassium normal at 4.3.  History of hyperthyroidism.  Follows with Dr. Aundra Dubin, per notes 06/04/19, patient was on amiodarone in the past which was discontinued after she went into a regular junctional rhythm and also developed hyperthyroidism. She also had a trial of DCCV 1/18 , which failed. - Continue home Cardizem - PRN IV metoprolol 5 mg with holding parameters - Placed cardiology consult - Check Hs troponin, magnesium - Recent TSH low, check free T3, T4 -Planned outpatient echocardiogram, will order -Resume home Eliquis  COPD exacerbation- reduced air entry bilaterally.  Currently on room air.  Not on home O2.  Compliant with Eliquis.  CTA chest negative for PE, shows tiny bilateral pleural effusions. POC Covid test negative, -125 mg Solu-Medrol given in Ed, continue 60 mg q12hr -Albuterol inhaler as needed, scheduled - IV Doxycycline -Follow-up Covid PCR test  Hyperthyroidism-recent TSH 06/04/2019 0.344.  CTA chest today showing Diffuse enlargement of LEFT thyroid lobe extending retro manubrial with goiter versus 4.6 x 3.2 cm diameter inferior LEFT thyroid lobe mass; recommend non emergent ultrasound characterization.  - Check T3, T4  Diastolic CHF-stable and compensated clinically.  No peripheral sign of volume overload.  CTA chest shows tiny bilateral pleural effusions.  BNP elevated at 527, when compared to BNP 3 years ago.  Last  echo 04/2017-EF 60 to 65%, blood pressure currently soft.   - Recent dose adjustments to 60 mg daily of torsemide, will resume in a.m. -IV Lasix 40 mg x 1 given in ED, hold further IV Lasix dosing - BMP a.m  Valvular heart disease: hx of severe aortic stenosis and moderate MR, s/p AVR (tissue) and MV ring with Maze procedure 03/15/13.  - Resume home Eliquis - F/u Echo  Thrombocytosis, leukocytosis-platelets 1680. WBC 15.5.  Follows with Dr. Delton Coombes, undergoing work-up.  No focus of infection identified. Episode of confusion 06/11/2019.  Symptoms have completely  resolved.  Patient currently at baseline, no further episodes.  At that time, consideration for TIA. -Will obtain head CT that had been ordered for outpatient -Continue home Eliquis  DVT prophylaxis: Eliquis Code Status: DNI-confirmed with daughter [name]) and patient at bedside. Family Communication: Daughter [name] at bedside (CCU Nurse), is patient primary Media planner.  Plan of care explained, all questions answered. Disposition Plan: Pending improvement in heart rate, dyspnea. Consults called: Cardiology Admission status: Observation, telemetry   Bethena Roys MD [facility]  06/24/2019, 6:59 PM

## 2019-06-24 NOTE — ED Triage Notes (Signed)
Pt reports being increasingly sob for the past few days. Denies cough/fever.

## 2019-06-24 NOTE — Progress Notes (Signed)
Date and time results received: 06/24/19 1145 (use smartphrase ".now" to insert current time)  Test: platelets Critical Value: 1680  Name of Provider Notified: Dr. Tomi Bamberger  Orders Received? Or Actions Taken?:n/a

## 2019-06-24 NOTE — ED Provider Notes (Addendum)
[facility] EMERGENCY DEPARTMENT Provider Note   CSN: [id number] Arrival date & time: 06/24/19  1036     History Chief Complaint  Patient presents with  . Shortness of Breath    Nicole [name] is a 84 y.o. female.  HPI    Patient presents to the emergency room for evaluation of shortness of breath.  Patient states she has noticed it gradually increasing over the last few days.  She feels better when she is at rest but as soon as she starts to do anything or even talk she will become short of breath.  She denies having any trouble with fevers.  No coughing.  She denies chest pain or leg swelling.  Patient does have a history of COPD.  She has been using her inhalers but has not noticed any significant improvement.  Past Medical History:  Diagnosis Date  . Anxiety   . Atrial fibrillation ([place])    with Rapid Ventricular response  . COPD (chronic obstructive pulmonary disease) ([place])   . Diastolic CHF ([place])   . Hyperlipidemia   . SOB (shortness of breath)    conplicated by underlying a fib with RVR,COPD, and CHF    Patient Active Problem List   Diagnosis Date Noted  . Thrombocytosis ([place]) 06/11/2019  . Chronic back pain 06/24/2017  . Symptomatic anemia 06/23/2017  . Hyperthyroidism 08/05/2015  . Chronic diastolic CHF (congestive heart failure) ([place]) 02/28/2013  . Aortic stenosis 02/28/2013  . CAD (coronary artery disease) 02/11/2013  . Acute on chronic diastolic HF (heart failure) ([place]) 02/11/2013  . Atrial fibrillation ([place]) 02/11/2013    Past Surgical History:  Procedure Laterality Date  . ABDOMINAL HYSTERECTOMY    . AORTIC VALVE REPLACEMENT N/A 03/05/2013   Procedure: AORTIC VALVE REPLACEMENT (AVR);  Surgeon: Gaye Pollack, MD;  Location: [facility];  Service: Open Heart Surgery;  Laterality: N/A;  . BREAST BIOPSY     x3  . CARDIOVERSION N/A 02/13/2013   Procedure: CARDIOVERSION;  Surgeon: [name], MD;  Location: [facility] ENDOSCOPY;  Service: Cardiovascular;  Laterality:  N/A;  . CARDIOVERSION N/A 03/01/2013   Procedure: CARDIOVERSION;  Surgeon: [name], MD;  Location: [facility] ENDOSCOPY;  Service: Cardiovascular;  Laterality: N/A;  . CARDIOVERSION N/A 06/23/2016   Procedure: CARDIOVERSION;  Surgeon: [name], MD;  Location: [facility];  Service: Cardiovascular;  Laterality: N/A;  . CATARACT EXTRACTION    . COLONOSCOPY WITH PROPOFOL N/A 06/24/2017   Procedure: COLONOSCOPY WITH PROPOFOL;  Surgeon: Carol Ada, MD;  Location: [facility];  Service: Endoscopy;  Laterality: N/A;  . ESOPHAGOGASTRODUODENOSCOPY N/A 06/24/2017   Procedure: ESOPHAGOGASTRODUODENOSCOPY (EGD);  Surgeon: Carol Ada, MD;  Location: [facility];  Service: Endoscopy;  Laterality: N/A;  . INTRAOPERATIVE TRANSESOPHAGEAL ECHOCARDIOGRAM N/A 03/05/2013   Procedure: INTRAOPERATIVE TRANSESOPHAGEAL ECHOCARDIOGRAM;  Surgeon: Gaye Pollack, MD;  Location: [facility];  Service: Open Heart Surgery;  Laterality: N/A;  . LEFT AND RIGHT HEART CATHETERIZATION WITH CORONARY ANGIOGRAM N/A 03/02/2013   Procedure: LEFT AND RIGHT HEART CATHETERIZATION WITH CORONARY ANGIOGRAM;  Surgeon: [name], MD;  Location: [facility] CATH LAB;  Service: Cardiovascular;  Laterality: N/A;  . MAZE N/A 03/05/2013   Procedure: MAZE;  Surgeon: Gaye Pollack, MD;  Location: [facility];  Service: Open Heart Surgery;  Laterality: N/A;  . MITRAL VALVE REPAIR N/A 03/05/2013   Procedure: MITRAL VALVE REPAIR (MVR);  Surgeon: Gaye Pollack, MD;  Location: [facility];  Service: Open Heart Surgery;  Laterality: N/A;  . ROTATOR CUFF REPAIR    .  TEE WITHOUT CARDIOVERSION N/A 03/01/2013   Procedure: TRANSESOPHAGEAL ECHOCARDIOGRAM (TEE);  Surgeon: [name], MD;  Location: [facility];  Service: Cardiovascular;  Laterality: N/A;  talked to bev. booking no. is [id number] called trish to verify time  . TONSILLECTOMY       OB History   No obstetric history on file.     Family History  Problem Relation Age of Onset  . Breast cancer Other         family history  . Heart disease Mother   . Throat cancer Father   . Heart disease Brother   . Heart attack Son   . Breast cancer Daughter     Social History   Tobacco Use  . Smoking status: Former Smoker    Packs/day: 1.00    Years: 57.00    Pack years: 57.00    Types: Cigarettes    Start date: 06/12/1955    Quit date: 11/28/2012    Years since quitting: 6.5  . Smokeless tobacco: Never Used  . Tobacco comment: Quit in July 2014  Substance Use Topics  . Alcohol use: No    Alcohol/week: 0.0 standard drinks  . Drug use: No    Home Medications Prior to Admission medications   Medication Sig Start Date End Date Taking? Authorizing Provider  albuterol (VENTOLIN HFA) 108 (90 Base) MCG/ACT inhaler Inhale 2 puffs into the lungs as needed.  05/09/19  Yes [provider]  atorvastatin (LIPITOR) 40 MG tablet TAKE 1 TABLET BY MOUTH EVERY DAY IN THE EVENING 03/01/19  Yes McLean, Elby Showers, MD  BREO ELLIPTA 100-25 MCG/INH AEPB Inhale 1 puff into the lungs daily. 06/20/19  Yes [provider]  diltiazem (CARDIZEM CD) 240 MG 24 hr capsule TAKE 1 CAPSULE BY MOUTH EVERY DAY 03/01/19  Yes [name], MD  DULoxetine (CYMBALTA) 20 MG capsule Take 1 capsule (20 mg total) by mouth daily. 06/24/17  Yes Nedrud, Larena Glassman, MD  ELIQUIS 5 MG TABS tablet TAKE 1 TABLET BY MOUTH TWICE A DAY 03/21/19  Yes [name], MD  ferrous sulfate 325 (65 FE) MG EC tablet Take 325 mg by mouth daily with breakfast.   Yes [provider]  KLOR-CON M20 20 MEQ tablet TAKE 2 TABLETS BY MOUTH IN THE MORNING, THEN 1 TABLET IN THE EVENING 04/23/19  Yes Bensimhon, Shaune Pascal, MD  Multiple Vitamins-Minerals (MULTIVITAMIN WITH MINERALS) tablet Take 1 tablet by mouth daily.   Yes [provider]  naproxen sodium (ALEVE) 220 MG tablet Take 220 mg by mouth daily as needed (for pain).   Yes [provider]  torsemide (DEMADEX) 20 MG tablet Take 3 tablets (60 mg total) by mouth daily.  06/04/19  Yes [name], MD    Allergies    Patient has no known allergies.  Review of Systems   Review of Systems  All other systems reviewed and are negative.   Physical Exam Updated Vital Signs BP 97/74   Pulse (!) 41   Temp 98 F (36.7 C) (Oral)   Resp (!) 27   Ht 1.524 m (5')   Wt 80.3 kg   SpO2 95%   BMI 34.57 kg/m   Physical Exam  ED Results / Procedures / Treatments   Labs (all labs ordered are listed, but only abnormal results are displayed) Labs Reviewed  BASIC METABOLIC PANEL - Abnormal; Notable for the following components:      Result Value   Glucose, Bld 115 (*)  GFR calc non Af Amer 54 (*)    All other components within normal limits  CBC - Abnormal; Notable for the following components:   WBC 15.5 (*)    RBC 3.69 (*)    Hemoglobin 11.0 (*)    HCT 35.7 (*)    Platelets 1,680 (*)    nRBC 0.3 (*)    All other components within normal limits  BRAIN NATRIURETIC PEPTIDE - Abnormal; Notable for the following components:   B Natriuretic Peptide 527.0 (*)    All other components within normal limits  PATHOLOGIST SMEAR REVIEW  POC SARS CORONAVIRUS 2 AG -  ED    EKG EKG Interpretation  Date/Time:  Sunday June 24 2019 11:41:53 EST Ventricular Rate:  130 PR Interval:    QRS Duration: 72 QT Interval:  336 QTC Calculation: 495 R Axis:   25 Text Interpretation: Atrial fibrillation Ventricular premature complex Abnormal R-wave progression, early transition Repolarization abnormality, prob rate related Borderline prolonged QT interval No significant change since last tracing Confirmed by Dorie Rank [id number]) on 06/24/2019 1:07:50 PM   Radiology CT Angio Chest PE W and/or Wo Contrast  Result Date: 06/24/2019 CLINICAL DATA:  Shortness of breath for 2 days, abdominal pain, atrial fibrillation, COPD, CHF, post AVR/MVR, COVID negative EXAM: CT ANGIOGRAPHY CHEST WITH CONTRAST TECHNIQUE: Multidetector CT imaging of the chest was performed using the  standard protocol during bolus administration of intravenous contrast. Multiplanar CT image reconstructions and MIPs were obtained to evaluate the vascular anatomy. CONTRAST:  195mL OMNIPAQUE IOHEXOL 350 MG/ML SOLN IV COMPARISON:  None FINDINGS: Cardiovascular: Atherosclerotic calcifications aorta, tortuous proximal great vessels, and coronary arteries. Post LEFT atrial appendage clipping. Aorta normal caliber without aneurysm or dissection. Enlargement of cardiac chambers. No pericardial effusion. Pulmonary arteries adequately opacified and patent. No evidence of pulmonary embolism. Mediastinum/Nodes: Diffuse enlargement of the LEFT thyroid lobe with enlarged lobe versus mass 4.6 x 3.2 cm extending retro manubrial. Additional calcified nodule LEFT lobe 13 mm diameter. Few mildly prominent mediastinal lymph nodes up to 13 mm short axis RIGHT paratracheal image 31 and 13 mm precarinal image 34. Esophagus unremarkable. Lungs/Pleura: Tiny BILATERAL pleural effusions with dependent atelectasis posterior RIGHT lower lobe. Remaining lungs clear. No acute infiltrate or pneumothorax. Kyphosis Upper Abdomen: Large ventral hernia in epigastrium measuring up to 7.9 cm transverse and 8.2 cm craniocaudal. Herniation of gastric antrum and transverse colon into hernia sac without obstruction. Question lobulation of the anterior spleen versus isodense splenic mass 6 cm diameter. Musculoskeletal: Kyphosis of thoracic spine with scattered degenerative disc disease changes. Post median sternotomy. No acute osseous findings. Review of the MIP images confirms the above findings. IMPRESSION: No evidence of pulmonary embolism. Tiny BILATERAL pleural effusions and mild dependent RIGHT basilar atelectasis. Diffuse enlargement of LEFT thyroid lobe extending retro manubrial with goiter versus 4.6 x 3.2 cm diameter inferior LEFT thyroid lobe mass; recommend non emergent ultrasound characterization. Large epigastric ventral hernia containing  gastric antrum and transverse colon without obstruction. Extensive atherosclerotic calcifications including coronary arteries with postsurgical changes of AVR and MVR as well as Maze procedure. Suspect lobulated spleen but unable to completely exclude isodense anterior splenic mass; recommend follow-up ultrasound characterization. Aortic Atherosclerosis (ICD10-I70.0). Electronically Signed   By: Lavonia Dana M.D.   On: 06/24/2019 14:36   DG Chest Port 1 View  Result Date: 06/24/2019 CLINICAL DATA:  Shortness of breath, dyspnea EXAM: PORTABLE CHEST 1 VIEW COMPARISON:  02/09/2017 FINDINGS: Mild patchy opacity at the lateral right base. Very mild patchy opacity in  the left mid lung. No pleural effusion or pneumothorax. Cardiomegaly.  Prosthetic valve.  Thoracic aortic atherosclerosis. Median sternotomy. IMPRESSION: Mild patchy opacities in the left upper lobe and right lower lobe, nonspecific but raising the possibility of mild infection. Electronically Signed   By: Julian Hy M.D.   On: 06/24/2019 11:26    Procedures Procedures (including critical care time)  Medications Ordered in ED Medications  methylPREDNISolone sodium succinate (SOLU-MEDROL) 125 mg/2 mL injection 125 mg (125 mg Intravenous Given 06/24/19 1135)  albuterol (VENTOLIN HFA) 108 (90 Base) MCG/ACT inhaler 4 puff (4 puffs Inhalation Given 06/24/19 1139)  diltiazem (CARDIZEM) injection 10 mg (10 mg Intravenous Given 06/24/19 1322)  furosemide (LASIX) injection 40 mg (40 mg Intravenous Given 06/24/19 1434)  iohexol (OMNIPAQUE) 350 MG/ML injection 100 mL (100 mLs Intravenous Contrast Given 06/24/19 1357)    ED Course  I have reviewed the triage vital signs and the nursing notes.  Pertinent labs & imaging results that were available during my care of the patient were reviewed by me and considered in my medical decision making (see chart for details).  Clinical Course as of Jun 23 1536  Sun Jun 24, 2019  1328 Discussed findings with  daughter.  BNP is elevated but no definite pulmonary edema on chest x-ray.  Atrial fibrillation is not well controlled today and this certainly could be contributing to a component of CHF.  Considering her thrombocytosis we will proceed with CT scan to rule out pulmonary embolism.   [JK]    Clinical Course User Index [JK] Dorie Rank, MD   MDM Rules/Calculators/A&P                     Patient's ED work-up is notable for increased BNP.  Clinically patient appears to have a component of COPD as well.  Covid screen is negative.  CT scan does not show evidence of pulmonary embolism.  Patient appears to have continued increased work of breathing.  Considering her CHF and COPD I think she would benefit from overnight observation treatment.  I will consult the medical service  Final Clinical Impression(s) / ED Diagnoses Final diagnoses:  Acute on chronic congestive heart failure, unspecified heart failure type ([place])  COPD exacerbation ([place])  Thrombocytosis ([place])  Thyroid enlarged  Spleen anomaly      Dorie Rank, MD 06/24/19 1459    Dorie Rank, MD 06/24/19 1538

## 2019-06-25 ENCOUNTER — Observation Stay (HOSPITAL_BASED_OUTPATIENT_CLINIC_OR_DEPARTMENT_OTHER): Payer: Medicare Other

## 2019-06-25 ENCOUNTER — Ambulatory Visit (HOSPITAL_COMMUNITY): Admission: RE | Admit: 2019-06-25 | Payer: Medicare Other | Source: Ambulatory Visit

## 2019-06-25 DIAGNOSIS — D473 Essential (hemorrhagic) thrombocythemia: Secondary | ICD-10-CM | POA: Diagnosis not present

## 2019-06-25 DIAGNOSIS — K439 Ventral hernia without obstruction or gangrene: Secondary | ICD-10-CM | POA: Diagnosis present

## 2019-06-25 DIAGNOSIS — I4821 Permanent atrial fibrillation: Secondary | ICD-10-CM | POA: Diagnosis present

## 2019-06-25 DIAGNOSIS — E0590 Thyrotoxicosis, unspecified without thyrotoxic crisis or storm: Secondary | ICD-10-CM | POA: Diagnosis not present

## 2019-06-25 DIAGNOSIS — J9601 Acute respiratory failure with hypoxia: Secondary | ICD-10-CM | POA: Diagnosis present

## 2019-06-25 DIAGNOSIS — I361 Nonrheumatic tricuspid (valve) insufficiency: Secondary | ICD-10-CM

## 2019-06-25 DIAGNOSIS — Z953 Presence of xenogenic heart valve: Secondary | ICD-10-CM

## 2019-06-25 DIAGNOSIS — E785 Hyperlipidemia, unspecified: Secondary | ICD-10-CM | POA: Diagnosis present

## 2019-06-25 DIAGNOSIS — I5033 Acute on chronic diastolic (congestive) heart failure: Secondary | ICD-10-CM | POA: Diagnosis present

## 2019-06-25 DIAGNOSIS — D72829 Elevated white blood cell count, unspecified: Secondary | ICD-10-CM | POA: Diagnosis present

## 2019-06-25 DIAGNOSIS — J441 Chronic obstructive pulmonary disease with (acute) exacerbation: Secondary | ICD-10-CM

## 2019-06-25 DIAGNOSIS — Z20822 Contact with and (suspected) exposure to covid-19: Secondary | ICD-10-CM | POA: Diagnosis present

## 2019-06-25 DIAGNOSIS — Z7951 Long term (current) use of inhaled steroids: Secondary | ICD-10-CM | POA: Diagnosis not present

## 2019-06-25 DIAGNOSIS — I350 Nonrheumatic aortic (valve) stenosis: Secondary | ICD-10-CM | POA: Diagnosis present

## 2019-06-25 DIAGNOSIS — R0602 Shortness of breath: Secondary | ICD-10-CM | POA: Diagnosis present

## 2019-06-25 DIAGNOSIS — I5032 Chronic diastolic (congestive) heart failure: Secondary | ICD-10-CM | POA: Diagnosis not present

## 2019-06-25 DIAGNOSIS — I4891 Unspecified atrial fibrillation: Secondary | ICD-10-CM | POA: Diagnosis not present

## 2019-06-25 DIAGNOSIS — E6609 Other obesity due to excess calories: Secondary | ICD-10-CM | POA: Diagnosis not present

## 2019-06-25 DIAGNOSIS — E669 Obesity, unspecified: Secondary | ICD-10-CM | POA: Diagnosis present

## 2019-06-25 DIAGNOSIS — Z6836 Body mass index (BMI) 36.0-36.9, adult: Secondary | ICD-10-CM | POA: Diagnosis not present

## 2019-06-25 DIAGNOSIS — E0500 Thyrotoxicosis with diffuse goiter without thyrotoxic crisis or storm: Secondary | ICD-10-CM | POA: Diagnosis present

## 2019-06-25 DIAGNOSIS — Z90710 Acquired absence of both cervix and uterus: Secondary | ICD-10-CM | POA: Diagnosis not present

## 2019-06-25 DIAGNOSIS — E049 Nontoxic goiter, unspecified: Secondary | ICD-10-CM | POA: Diagnosis not present

## 2019-06-25 DIAGNOSIS — Z7901 Long term (current) use of anticoagulants: Secondary | ICD-10-CM | POA: Diagnosis not present

## 2019-06-25 DIAGNOSIS — I509 Heart failure, unspecified: Secondary | ICD-10-CM | POA: Diagnosis not present

## 2019-06-25 DIAGNOSIS — Z79899 Other long term (current) drug therapy: Secondary | ICD-10-CM | POA: Diagnosis not present

## 2019-06-25 DIAGNOSIS — Z87891 Personal history of nicotine dependence: Secondary | ICD-10-CM | POA: Diagnosis not present

## 2019-06-25 LAB — BASIC METABOLIC PANEL
Anion gap: 12 (ref 5–15)
BUN: 19 mg/dL (ref 8–23)
CO2: 23 mmol/L (ref 22–32)
Calcium: 8.7 mg/dL — ABNORMAL LOW (ref 8.9–10.3)
Chloride: 105 mmol/L (ref 98–111)
Creatinine, Ser: 0.93 mg/dL (ref 0.44–1.00)
GFR calc Af Amer: >60 mL/min (ref >60)
GFR calc non Af Amer: 57 mL/min — ABNORMAL LOW (ref >60)
Glucose, Bld: 164 mg/dL — ABNORMAL HIGH (ref 70–99)
Potassium: 4.2 mmol/L (ref 3.5–5.1)
Sodium: 140 mmol/L (ref 135–145)

## 2019-06-25 LAB — CBC
HCT: 32.8 % — ABNORMAL LOW (ref 36.0–46.0)
Hemoglobin: 10 g/dL — ABNORMAL LOW (ref 12.0–15.0)
MCH: 29.4 pg (ref 26.0–34.0)
MCHC: 30.5 g/dL (ref 30.0–36.0)
MCV: 96.5 fL (ref 80.0–100.0)
Platelets: 1522 10*3/uL (ref 150–400)
RBC: 3.4 MIL/uL — ABNORMAL LOW (ref 3.87–5.11)
RDW: 15.2 % (ref 11.5–15.5)
WBC: 18.7 10*3/uL — ABNORMAL HIGH (ref 4.0–10.5)
nRBC: 0 % (ref 0.0–0.2)

## 2019-06-25 LAB — T4, FREE: Free T4: 1.19 ng/dL — ABNORMAL HIGH (ref 0.61–1.12)

## 2019-06-25 LAB — GLUCOSE, CAPILLARY
Glucose-Capillary: 151 mg/dL — ABNORMAL HIGH (ref 70–99)
Glucose-Capillary: 134 mg/dL — ABNORMAL HIGH (ref 70–99)
Glucose-Capillary: 152 mg/dL — ABNORMAL HIGH (ref 70–99)
Glucose-Capillary: 163 mg/dL — ABNORMAL HIGH (ref 70–99)

## 2019-06-25 LAB — ECHOCARDIOGRAM COMPLETE
Height: 60 in
Weight: 2977.09 oz

## 2019-06-25 MED ORDER — METHYLPREDNISOLONE SODIUM SUCC 40 MG IJ SOLR
40.0000 mg | Freq: Two times a day (BID) | INTRAMUSCULAR | Status: DC
  Administered 2019-06-25 – 2019-06-26 (×2): 40 mg via INTRAVENOUS
  Filled 2019-06-25 (×2): qty 1

## 2019-06-25 MED ORDER — DOXYCYCLINE HYCLATE 100 MG PO TABS
100.0000 mg | ORAL_TABLET | Freq: Two times a day (BID) | ORAL | Status: DC
  Administered 2019-06-25 – 2019-06-26 (×2): 100 mg via ORAL
  Filled 2019-06-25 (×2): qty 1

## 2019-06-25 MED ORDER — METOPROLOL TARTRATE 5 MG/5ML IV SOLN
2.5000 mg | Freq: Three times a day (TID) | INTRAVENOUS | Status: DC
  Administered 2019-06-25 (×2): 2.5 mg via INTRAVENOUS
  Filled 2019-06-25 (×3): qty 5

## 2019-06-25 NOTE — Consult Note (Addendum)
Cardiology Consultation:   Patient ID: Nicole [name] MRN: [id number]; DOB: September 05, 1935  Admit date: 06/24/2019 Date of Consult: 06/25/2019  Primary Care Provider: Patient, No Pcp [name] Primary Cardiologist: [name], MD [facility] Primary Electrophysiologist:  None    Patient Profile:   Nicole [name] is an 84 y.o. female with a hx of tissue AVR, MV ring, Maze 2014, chronic Afib who is being seen today for the evaluation of Afib with RVR at the request of Dr. Wynetta Emery.  History of Present Illness:   Nicole [name] is an 84 yo female, retired surgical nurse with history of tissue AVR, MV ring with Maze 03/15/13, Chronic Atrial fibrillation on Eliquis s/P Maze junctional rhythm and hyperthyroidism on Amio-stopped and she was supposed to take methimazole but never did, failed DCCV 2018.  Saw Dr. Aundra Dubin 06/04/19 and torsemide decreased from 80 mgto 60 mg daily bc of 20 lb weight loss. TSH low 0.344 and free T4 1.19.Sent to hematologist for elevated platelets. Saw Dr. Ludwig Lean 06/11/19 and had confusion on way to the appt. CT negative for CVA. Was being checked for myeloproliferative disorder.  Patient says Friday she developed worsening shortness of breath and was going to increase her torsemide back but came here instead. Can't tell when her heart is racing.  PMH: 1. COPD: PFTs (5/15) with FEV1 61%, RVC 63%, ratio 96%, DLCO 50%.   2. Atrial fibrillation: First noted in 7/14.  DCCV in 8/14 to NSR.  Recurred by 9/14.  DCCV to NSR 9/14 but recurred. S/p Maze procedure 10/14.  Recurrent atrial fibrillation around 5/16, later developed a junctional rhythm on amiodarone and amiodarone stopped.  She was noted to be back in atrial fibrillation in 1/18, failed DCCV in 1/18.  3. Hyperlipidemia 4. Diastolic CHF: Echo (123456) with EF 55-60%, grade II diastolic dysfunction, mild LVH, moderate to severe AS with mean gradient 33 mmHg/peak 49 mmHg and AVA 0.74 cm^2.  Echo (2/17) with EF 60-65%, mild focal basal  septal hypertrophy, bioprosthetic aortic valve appeared normal, s/p mitral valve repair with no significant mitral stenosis.  - Echo (12/18): EF 60-65%, moderate RV dilation with mildly decreased systolic function, peak RV-RA gradient 31 mmHg, normal bioprosthetic aortic valve, normal repaired mitral valve.  5. Severe AS/Moderate MR. 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 Normal coronaries cath 10/14 7. Ventral hernia.  8. Low back pain: Lumbar disc disease.  9. Hyperthyroidism: Subclinical.  Likely related to amiodarone use.  10. Junctional rhythm.   Past Surgical History:  Procedure Laterality Date  . ABDOMINAL HYSTERECTOMY    . AORTIC VALVE REPLACEMENT N/A 03/05/2013   Procedure: AORTIC VALVE REPLACEMENT (AVR);  Surgeon: Gaye Pollack, MD;  Location: [place];  Service: Open Heart Surgery;  Laterality: N/A;  . BREAST BIOPSY     x3  . CARDIOVERSION N/A 02/13/2013   Procedure: CARDIOVERSION;  Surgeon: [name], MD;  Location: [facility] ENDOSCOPY;  Service: Cardiovascular;  Laterality: N/A;  . CARDIOVERSION N/A 03/01/2013   Procedure: CARDIOVERSION;  Surgeon: [name], MD;  Location: [facility] ENDOSCOPY;  Service: Cardiovascular;  Laterality: N/A;  . CARDIOVERSION N/A 06/23/2016   Procedure: CARDIOVERSION;  Surgeon: [name], MD;  Location: [place];  Service: Cardiovascular;  Laterality: N/A;  . CATARACT EXTRACTION    . COLONOSCOPY WITH PROPOFOL N/A 06/24/2017   Procedure: COLONOSCOPY WITH PROPOFOL;  Surgeon: Carol Ada, MD;  Location: [place];  Service: Endoscopy;  Laterality:  N/A;  . ESOPHAGOGASTRODUODENOSCOPY N/A 06/24/2017   Procedure: ESOPHAGOGASTRODUODENOSCOPY (EGD);  Surgeon: Carol Ada, MD;  Location: [place];  Service: Endoscopy;  Laterality: N/A;  . INTRAOPERATIVE TRANSESOPHAGEAL ECHOCARDIOGRAM N/A 03/05/2013   Procedure: INTRAOPERATIVE TRANSESOPHAGEAL ECHOCARDIOGRAM;  Surgeon: Gaye Pollack, MD;  Location: [place] [facility];  Service: Open Heart Surgery;  Laterality: N/A;  . LEFT AND RIGHT HEART CATHETERIZATION WITH CORONARY ANGIOGRAM N/A 03/02/2013   Procedure: LEFT AND RIGHT HEART CATHETERIZATION WITH CORONARY ANGIOGRAM;  Surgeon: [name], MD;  Location: [facility] CATH LAB;  Service: Cardiovascular;  Laterality: N/A;  . MAZE N/A 03/05/2013   Procedure: MAZE;  Surgeon: Gaye Pollack, MD;  Location: [place];  Service: Open Heart Surgery;  Laterality: N/A;  . MITRAL VALVE REPAIR N/A 03/05/2013   Procedure: MITRAL VALVE REPAIR (MVR);  Surgeon: Gaye Pollack, MD;  Location: [place];  Service: Open Heart Surgery;  Laterality: N/A;  . ROTATOR CUFF REPAIR    . TEE WITHOUT CARDIOVERSION N/A 03/01/2013   Procedure: TRANSESOPHAGEAL ECHOCARDIOGRAM (TEE);  Surgeon: [name], MD;  Location: [place];  Service: Cardiovascular;  Laterality: N/A;  talked to bev. booking no. is [id number] called trish to verify time  . TONSILLECTOMY       Home Medications:  Prior to Admission medications   Medication Sig Start Date End Date Taking? Authorizing Provider  albuterol (VENTOLIN HFA) 108 (90 Base) MCG/ACT inhaler Inhale 2 puffs into the lungs as needed.  05/09/19  Yes [provider]  atorvastatin (LIPITOR) 40 MG tablet TAKE 1 TABLET BY MOUTH EVERY DAY IN THE EVENING 03/01/19  Yes McLean, Elby Showers, MD  BREO ELLIPTA 100-25 MCG/INH AEPB Inhale 1 puff into the lungs daily. 06/20/19  Yes [provider]  diltiazem (CARDIZEM CD) 240 MG 24 hr capsule TAKE 1 CAPSULE BY MOUTH EVERY DAY 03/01/19  Yes [name], MD  DULoxetine (CYMBALTA) 20 MG capsule Take 1 capsule (20 mg total) by mouth daily. 06/24/17  Yes Nedrud, Larena Glassman, MD  ELIQUIS 5 MG TABS tablet TAKE 1 TABLET BY MOUTH TWICE A DAY 03/21/19  Yes [name], MD  ferrous sulfate 325 (65 FE) MG EC tablet Take 325 mg by mouth daily with breakfast.   Yes [provider]  KLOR-CON M20 20 MEQ tablet TAKE 2 TABLETS BY MOUTH IN  THE MORNING, THEN 1 TABLET IN THE EVENING 04/23/19  Yes Bensimhon, Shaune Pascal, MD  Multiple Vitamins-Minerals (MULTIVITAMIN WITH MINERALS) tablet Take 1 tablet by mouth daily.   Yes [provider]  naproxen sodium (ALEVE) 220 MG tablet Take 220 mg by mouth daily as needed (for pain).   Yes [provider]  torsemide (DEMADEX) 20 MG tablet Take 3 tablets (60 mg total) by mouth daily. 06/04/19  Yes [name], MD    Inpatient Medications: Scheduled Meds: . apixaban  5 mg Oral BID  . atorvastatin  40 mg Oral q1800  . diltiazem  240 mg Oral Daily  . doxycycline  100 mg Oral Q12H  . DULoxetine  20 mg Oral Daily  . fluticasone furoate-vilanterol  1 puff Inhalation Daily  . methylPREDNISolone (SOLU-MEDROL) injection  60 mg Intravenous Q12H  . torsemide  60 mg Oral Daily    PRN Meds: acetaminophen **OR** acetaminophen, albuterol, metoprolol tartrate, ondansetron **OR** ondansetron (ZOFRAN) IV, polyethylene glycol  Allergies:   No Known Allergies  Social History:   Social History   Socioeconomic History  . Marital status: Divorced    Spouse name: Not on  file  . Number of children: 2  . Years of education: Not on file  . Highest education level: Not on file  Occupational History  . Occupation: retired  Tobacco Use  . Smoking status: Former Smoker    Packs/day: 1.00    Years: 57.00    Pack years: 57.00    Types: Cigarettes    Start date: 06/12/1955    Quit date: 11/28/2012    Years since quitting: 6.5  . Smokeless tobacco: Never Used  . Tobacco comment: Quit in July 2014  Substance and Sexual Activity  . Alcohol use: No    Alcohol/week: 0.0 standard drinks  . Drug use: No  . Sexual activity: Never  Other Topics Concern  . Not on file  Social History Narrative  . Not on file   Social Determinants of Health   Financial Resource Strain: Low Risk   . Difficulty of Paying Living Expenses: Not hard at all  Food Insecurity: No Food Insecurity  . Worried  About Charity fundraiser in the Last Year: Never true  . Ran Out of Food in the Last Year: Never true  Transportation Needs: No Transportation Needs  . Lack of Transportation (Medical): No  . Lack of Transportation (Non-Medical): No  Physical Activity: Inactive  . Days of Exercise [name] Week: 0 days  . Minutes of Exercise [name] Session: 0 min  Stress: No Stress Concern Present  . Feeling of Stress : Not at all  Social Connections: Somewhat Isolated  . Frequency of Communication with Friends and Family: More than three times a week  . Frequency of Social Gatherings with Friends and Family: More than three times a week  . Attends Religious Services: 1 to 4 times [name] year  . Active Member of Clubs or Organizations: No  . Attends Archivist Meetings: Never  . Marital Status: Divorced  Human resources officer Violence: Not At Risk  . Fear of Current or Ex-Partner: No  . Emotionally Abused: No  . Physically Abused: No  . Sexually Abused: No    Family History:     Family History  Problem Relation Age of Onset  . Breast cancer Other        family history  . Heart disease Mother   . Throat cancer Father   . Heart disease Brother   . Heart attack Son   . Breast cancer Daughter      ROS:  Please see the history of present illness.  Review of Systems  Constitution: Negative.  HENT: Negative.   Eyes: Negative.   Cardiovascular: Positive for dyspnea on exertion and irregular heartbeat.  Respiratory: Positive for shortness of breath.   Hematologic/Lymphatic: Negative.   Musculoskeletal: Negative.  Negative for joint pain.  Gastrointestinal: Negative.   Genitourinary: Negative.   Neurological: Negative.    All other ROS reviewed and negative.     Physical Exam/Data:   Vitals:   06/24/19 2205 06/25/19 0605 06/25/19 0819 06/25/19 0950  BP: 106/70 94/76  109/64  Pulse: 88 81    Resp: 20 16    Temp: 98.2 F (36.8 C) 97.9 F (36.6 C)    TempSrc: Oral Oral    SpO2: 98% 98%  98%   Weight:      Height:        Intake/Output Summary (Last 24 hours) at 06/25/2019 1001 Last data filed at 06/25/2019 0300 [place] [name] 24 hour  Intake 250 ml  Output --  Net 250 ml   Last  3 Weights 06/24/2019 06/24/2019 06/11/2019  Weight (lbs) 186 lb 1.1 oz 177 lb 182 lb 4.8 oz  Weight (kg) 84.4 kg 80.287 kg 82.691 kg     Body mass index is 36.34 kg/m.  General:  Well nourished, well developed, in no acute distress  HEENT: normal Lymph: no adenopathy Neck:  increased JVD Endocrine:  No thryomegaly Vascular:  No carotid bruits; FA pulses 2+ bilaterally without bruits  Cardiac:  irreg irreg at [id number] 1/6 systolic murmur LSB Lungs:  clear to auscultation bilaterally, no wheezing, rhonchi or rales  Abd: soft, nontender, no hepatomegaly  Ext: no edema Musculoskeletal:  No deformities, BUE and BLE strength normal and equal Skin: warm and dry  Neuro:  CNs 2-12 intact, no focal abnormalities noted Psych:  Normal affect   EKG:  The EKG was personally reviewed and demonstrates:  Afib at 130/m with LVH nonspecific ST changes, no acute change Telemetry:  Telemetry was personally reviewed and demonstrates: Afib 110-130/m  Relevant CV Studies:    Laboratory Data:  High Sensitivity Troponin:   Recent Labs  Lab 06/24/19 1852 06/24/19 2057  TROPONINIHS 10 9     Chemistry Recent Labs  Lab 06/24/19 1100 06/25/19 0455  NA 139 140  K 4.3 4.2  CL 106 105  CO2 24 23  GLUCOSE 115* 164*  BUN 11 19  CREATININE 0.98 0.93  CALCIUM 9.1 8.7*  GFRNONAA 54* 57*  GFRAA >60 >60  ANIONGAP 9 12    Hematology Recent Labs  Lab 06/24/19 1100 06/25/19 0455  WBC 15.5* 18.7*  RBC 3.69* 3.40*  HGB 11.0* 10.0*  HCT 35.7* 32.8*  MCV 96.7 96.5  MCH 29.8 29.4  MCHC 30.8 30.5  RDW 15.4 15.2  PLT 1,680* 1,522*   BNP Recent Labs  Lab 06/24/19 1100  BNP 527.0*     Radiology/Studies:  CT HEAD WO CONTRAST  Result Date: 06/24/2019 CLINICAL DATA:  Encephalopathy EXAM: CT HEAD WITHOUT  CONTRAST TECHNIQUE: Contiguous axial images were obtained from the base of the skull through the vertex without intravenous contrast. COMPARISON:  None. FINDINGS: Brain: There is no mass, hemorrhage or extra-axial collection. There is generalized atrophy without lobar predilection. Hypodensity of the white matter is most commonly associated with chronic microvascular disease. There are multiple old small vessel infarcts of the deep gray nuclei. There is an old wedge-shaped left cerebellar infarct. Vascular: No abnormal hyperdensity of the major intracranial arteries or dural venous sinuses. No intracranial atherosclerosis. Skull: The visualized skull base, calvarium and extracranial soft tissues are normal. Sinuses/Orbits: No fluid levels or advanced mucosal thickening of the visualized paranasal sinuses. No mastoid or middle ear effusion. The orbits are normal. IMPRESSION: 1. No acute intracranial abnormality. 2. Multiple old small vessel infarcts and chronic ischemic microangiopathy. Electronically Signed   By: Ulyses Jarred M.D.   On: 06/24/2019 23:37   CT Angio Chest PE W and/or Wo Contrast  Result Date: 06/24/2019 CLINICAL DATA:  Shortness of breath for 2 days, abdominal pain, atrial fibrillation, COPD, CHF, post AVR/MVR, COVID negative EXAM: CT ANGIOGRAPHY CHEST WITH CONTRAST TECHNIQUE: Multidetector CT imaging of the chest was performed using the standard protocol during bolus administration of intravenous contrast. Multiplanar CT image reconstructions and MIPs were obtained to evaluate the vascular anatomy. CONTRAST:  140mL OMNIPAQUE IOHEXOL 350 MG/ML SOLN IV COMPARISON:  None FINDINGS: Cardiovascular: Atherosclerotic calcifications aorta, tortuous proximal great vessels, and coronary arteries. Post LEFT atrial appendage clipping. Aorta normal caliber without aneurysm or dissection. Enlargement of cardiac chambers. No pericardial  effusion. Pulmonary arteries adequately opacified and patent. No evidence  of pulmonary embolism. Mediastinum/Nodes: Diffuse enlargement of the LEFT thyroid lobe with enlarged lobe versus mass 4.6 x 3.2 cm extending retro manubrial. Additional calcified nodule LEFT lobe 13 mm diameter. Few mildly prominent mediastinal lymph nodes up to 13 mm short axis RIGHT paratracheal image 31 and 13 mm precarinal image 34. Esophagus unremarkable. Lungs/Pleura: Tiny BILATERAL pleural effusions with dependent atelectasis posterior RIGHT lower lobe. Remaining lungs clear. No acute infiltrate or pneumothorax. Kyphosis Upper Abdomen: Large ventral hernia in epigastrium measuring up to 7.9 cm transverse and 8.2 cm craniocaudal. Herniation of gastric antrum and transverse colon into hernia sac without obstruction. Question lobulation of the anterior spleen versus isodense splenic mass 6 cm diameter. Musculoskeletal: Kyphosis of thoracic spine with scattered degenerative disc disease changes. Post median sternotomy. No acute osseous findings. Review of the MIP images confirms the above findings. IMPRESSION: No evidence of pulmonary embolism. Tiny BILATERAL pleural effusions and mild dependent RIGHT basilar atelectasis. Diffuse enlargement of LEFT thyroid lobe extending retro manubrial with goiter versus 4.6 x 3.2 cm diameter inferior LEFT thyroid lobe mass; recommend non emergent ultrasound characterization. Large epigastric ventral hernia containing gastric antrum and transverse colon without obstruction. Extensive atherosclerotic calcifications including coronary arteries with postsurgical changes of AVR and MVR as well as Maze procedure. Suspect lobulated spleen but unable to completely exclude isodense anterior splenic mass; recommend follow-up ultrasound characterization. Aortic Atherosclerosis (ICD10-I70.0). Electronically Signed   By: Lavonia Dana M.D.   On: 06/24/2019 14:36   DG Chest Port 1 View  Result Date: 06/24/2019 CLINICAL DATA:  Shortness of breath, dyspnea EXAM: PORTABLE CHEST 1 VIEW  COMPARISON:  02/09/2017 FINDINGS: Mild patchy opacity at the lateral right base. Very mild patchy opacity in the left mid lung. No pleural effusion or pneumothorax. Cardiomegaly.  Prosthetic valve.  Thoracic aortic atherosclerosis. Median sternotomy. IMPRESSION: Mild patchy opacities in the left upper lobe and right lower lobe, nonspecific but raising the possibility of mild infection. Electronically Signed   By: Julian Hy M.D.   On: 06/24/2019 11:26    Assessment and Plan:   1. Atrial fib with RVR S/P MAZE, amio stopped b/c of junctional rhythm and hyperthyroidism, failed DCCV 2018 on Eliquis. She has untreated hyperthyroidism that is most likely contributing. Can add beta blocker for better rate control 2. Acute on chronic diastolic CHF-torsemide decreased by Dr. Aundra Dubin 06/04/19 bc of 20 lb weight loss. Echo being done now. Was given Lasix 40 mg IV x1 yesterday and now on demadex 60 mg daily.  I/O's positive. May benefit from more IV lasix. Suspect CHF secondary to rapid afib. 3. S/P AVR(tissue) and MV repair with Maze 03/15/13 4. Hyperthyroidism untreated and and enlarged left thyroid gland vs tumor on CT-needs treatment. 5. Elevated platelets work up being done by Hemeatologist 6. COPD quit smoking   For questions or updates, please contact [place] Please consult [email or web address] for contact info under    Signed, Ermalinda Barrios, PA-C  06/25/2019 10:01 AM    Attending note:  Patient seen and examined.  I reviewed her records and discussed the case with Ms. Bonnell Public PA-C.  Ms. Balian presents to the hospital with worsening shortness of breath, no sense of palpitations or chest pain.  Complex medical history is outlined above.  She recently saw Dr. Aundra Dubin with reduction in Demadex dose due to 20 pound weight loss and stable fluid status.  Follow-up echocardiogram was planned as well.  She is being managed for permanent  atrial fibrillation with a history of hyperthyroidism on amiodarone  and failed cardioversion.  She is on Cardizem CD as an outpatient in addition to Eliquis.  On examination this morning patient appears comfortable at rest.  She is in atrial fibrillation by telemetry which I personally reviewed with heart rate fluctuating from the 90s up into the 130s.  Systolic blood pressure [id number].  She has decreased breath sounds at the bases with prolonged expiratory phase, irregularly irregular rhythm with 2/6 systolic murmur and no gallop.  Large ventral hernia noted.  No peripheral edema.  Lab work shows potassium 4.2, BUN 19, creatinine 0.93, normal high-sensitivity troponin I levels, BNP 527, WBC 18.7, hemoglobin 10.0, platelets 1522, SARS coronavirus 2 test negative.  TSH 0.344.  Chest CTA shows no evidence of pulmonary embolus no pulmonary enlargement calcified nodule extensively AVR and MVR.  I personally reviewed her ECG from 06/24/2019 which showed atrial fibrillation with RVR.  Agree with continuation of Eliquis and Cardizem CD.  At least in the short-term term, will add low-dose IV Lopressor.  No change in present diuretic dose.  Follow-up echocardiogram is pending at this time and will be reviewed.  She is currently being treated for possible COPD exacerbation.  General plan will be to optimize heart rate control medications as blood pressure allows.  Further management of hyperthyroid status [name] primary team.  Satira Sark, M.D., F.A.C.C.

## 2019-06-25 NOTE — Progress Notes (Signed)
Patient in bed, moving and speaking on phone. Patient stable.  Patient has orders for night time cardiac meds. Will continue to monitor patient.

## 2019-06-25 NOTE — Progress Notes (Signed)
CRITICAL VALUE ALERT  Critical Value:  PLATELETS=1522  Date & Time Notied:  06/25/2019 @ [id number]  Provider Notified: ON CALL MD-[email or web address]  Orders Received/Actions taken: AWAITING ORDERS

## 2019-06-25 NOTE — Progress Notes (Signed)
Patient's HR in 130's and 140's. Cardiologist has been notified and received verbal order to administer the 1400 dose of metoprolol tartrate (Lopressor) injection 2.5 mg Intravenous.

## 2019-06-25 NOTE — Progress Notes (Signed)
PROGRESS NOTE    Nicole [name]  [id number] DOB: 1935-08-22 DOA: 06/24/2019 PCP: Patient, No Pcp Per     Brief Narrative:  As per H&P written by Dr. Denton Brick on 06/24/2019 84 y.o. female with medical history significant for COPD, atria fibrillation, CHF.  Daughter is present at bedside and helps with history.  Presented to the ED with c/o difficulty breathing over the past 2 days, symptom is worse with activity and improves with rest.  She denies cough.  No wheezing.  No fever no chills.  No chest pain. She reports compliance with her medications including Eliquis, torsemide which was recently reduced from 80 mg to 60 mg daily.  No lower extremity swelling.  Reports intentional weight loss over the past year.  06/11/2019, patient was on her way for her oncology visit, with her daughter when she had a 5-7 4-minute episode of confusion.  Patient was able to talk clearly, but was disoriented, unable to tell the year.  Most of this had resolved by the time she arrived at her oncologist office.  There was no extremity weakness, no facial asymmetry noted.  Head CT was planned as outpatient, and is pending.  ED Course: Tachycardic 120s- 140s in the ED, improved with 10 mg Cardizem.  Blood pressure systolic [id number].  BNP elevated at 527.  EKG showing atrial fibrillation rate 130s.  CTA chest negative for PE shows tiny bilateral pleural effusions and mild dependent right basilar atelectasis.  IV Lasix 40 mg x 1 given, Solu-Medrol 125 mg given.  Hospitalist to admit for further evaluation and management.  Assessment & Plan: 1-acute respiratory failure with hypoxia in the setting of COPD exacerbation and A. fib with RVR -Significantly improved currently and patient expressing much improvement in her breathing. -Denies orthopnea, palpitations or chest pain. -She is currently no wheezing and no using accessory muscle. -Will start steroids tapering and continue nebulizer management. -Doxycycline orally to  complete a total of 5 days will be provided for any underlying bronchitis/bronchiectasis component.   -Follow cardiology recommendation for further adjustment/optimization and heart rate control agents and adjusted dose of diuretics. -Follow electrolytes and replete as needed; continue monitoring on telemetry. -Continue supportive care.    2-A. fib with RVR -Chronic atrial fibrillation -Patient previously on amiodarone which has been stopped because of junctional rhythm and hypothyroidism.  Also status post maze surgery. -At home using Cardizem and is chronically on Eliquis. -Patient failed cardioversion in 2018. -Rate slightly improved at this moment -Will follow cardiology service recommendations.  3-goiter/hypothyroidism -TSH marginally abnormal -Follow T4 and T3 panel -If low-dose beta-blocker will be added this could be sufficient as part of current treatment for her hyperthyroidism. -Thyroid ultrasound as an outpatient and follow-up with endocrinology if needed as recommended.  4-thrombocytosis -Continue outpatient follow-up with hematology service. -Appears to be stable. -Patient chronically on Eliquis.  5-acute on chronic diastolic heart failure -Patient received a one-time dose of IV Lasix in the morning while admission -Demadex dose has once again readjusted to 60 mg by mouth daily -Volume status appears to be stable currently -No crackles.  6-hyperlipidemia -Continue statins.  DVT prophylaxis: Chronically on Eliquis. Code Status: Partial code (does not want to be intubated) Family Communication: No family at bedside. Disposition Plan: Patient will remain in the hospital while adjusting/optimizing heart rate control medications and further treatment for COPD exacerbation.  Will follow echo results and cardiology recommendations.  Consultants:   Cardiology service  Procedures:   2D echo: Ordered and pending  Antimicrobials:  Anti-infectives (From admission,  onward)   Start     Dose/Rate Route Frequency Ordered Stop   06/25/19 2200  doxycycline (VIBRA-TABS) tablet 100 mg     100 mg Oral Every 12 hours 06/25/19 0947 06/29/19 2159   06/24/19 2000  doxycycline (VIBRAMYCIN) 100 mg in sodium chloride 0.9 % 250 mL IVPB  Status:  Discontinued     100 mg 125 mL/hr over 120 Minutes Intravenous Every 12 hours 06/24/19 1832 06/25/19 0947      Subjective: No chest pain, no nausea, no vomiting; reports breathing is significantly improved.  Patient is afebrile.    Objective: Vitals:   06/25/19 0605 06/25/19 0819 06/25/19 0950 06/25/19 1022  BP: 94/76  109/64 109/64  Pulse: 81   89  Resp: 16   18  Temp: 97.9 F (36.6 C)   98.3 F (36.8 C)  TempSrc: Oral   Oral  SpO2: 98% 98%  97%  Weight:      Height:        Intake/Output Summary (Last 24 hours) at 06/25/2019 1122 Last data filed at 06/25/2019 0300 Gross per 24 hour  Intake 250 ml  Output --  Net 250 ml   Filed Weights   06/24/19 1053 06/24/19 1844  Weight: 80.3 kg 84.4 kg    Examination: General exam: Alert, awake, oriented x 3; reports no chest pain no palpitations (patient cannot feel when her heart rate is racing); breathing is improved and denies any nausea, vomiting or diaphoresis.  She is afebrile. Respiratory system: Decreased breath sounds at the bases, no frank crackles, no wheezing, fair air movement bilaterally.  No using accessory muscles. Cardiovascular system: Irregular, no rubs, no gallops, no JVD appreciated on exam.  Soft systolic ejection murmur appreciated on exam. Gastrointestinal system: Abdomen is nondistended, soft and nontender. No organomegaly or masses felt. Normal bowel sounds heard. Central nervous system: Alert and oriented. No focal neurological deficits. Extremities: No cyanosis or clubbing; no edema on exam. Skin: No rashes, no petechiae. Psychiatry: Judgement and insight appear normal. Mood & affect appropriate.     Data Reviewed: I have personally  reviewed following labs and imaging studies  CBC: Recent Labs  Lab 06/24/19 1100 06/25/19 0455  WBC 15.5* 18.7*  HGB 11.0* 10.0*  HCT 35.7* 32.8*  MCV 96.7 96.5  PLT 1,680* 123XX123*   Basic Metabolic Panel: Recent Labs  Lab 06/24/19 1100 06/24/19 1852 06/25/19 0455  NA 139  --  140  K 4.3  --  4.2  CL 106  --  105  CO2 24  --  23  GLUCOSE 115*  --  164*  BUN 11  --  19  CREATININE 0.98  --  0.93  CALCIUM 9.1  --  8.7*  MG  --  2.3  --    GFR: Estimated Creatinine Clearance: 45 mL/min (by C-G formula based on SCr of 0.93 mg/dL).  CBG: Recent Labs  Lab 06/25/19 0719 06/25/19 1101  GLUCAP 151* 134*   Thyroid Function Tests: Recent Labs    06/24/19 1852  FREET4 1.19*   Urine analysis:    Component Value Date/Time   COLORURINE STRAW (A) 02/09/2017 1330   APPEARANCEUR CLEAR 02/09/2017 1330   LABSPEC 1.006 02/09/2017 1330   PHURINE 7.0 02/09/2017 1330   GLUCOSEU NEGATIVE 02/09/2017 1330   HGBUR NEGATIVE 02/09/2017 1330   BILIRUBINUR NEGATIVE 02/09/2017 1330   KETONESUR NEGATIVE 02/09/2017 1330   PROTEINUR NEGATIVE 02/09/2017 1330   UROBILINOGEN 1.0 03/04/2013 1230   NITRITE NEGATIVE  02/09/2017 1330   LEUKOCYTESUR NEGATIVE 02/09/2017 1330    Recent Results (from the past 240 hour(s))  SARS CORONAVIRUS 2 (TAT 6-24 HRS) Nasopharyngeal Nasopharyngeal Swab     Status: None   Collection Time: 06/24/19  4:55 PM   Specimen: Nasopharyngeal Swab  Result Value Ref Range Status   SARS Coronavirus 2 NEGATIVE NEGATIVE Final    Comment: (NOTE) SARS-CoV-2 target nucleic acids are NOT DETECTED. The SARS-CoV-2 RNA is generally detectable in upper and lower respiratory specimens during the acute phase of infection. Negative results do not preclude SARS-CoV-2 infection, do not rule out co-infections with other pathogens, and should not be used as the sole basis for treatment or other patient management decisions. Negative results must be combined with clinical  observations, patient history, and epidemiological information. The expected result is Negative. Fact Sheet for Patients: SugarRoll.be Fact Sheet for Healthcare Providers: https://www.woods-mathews.com/ This test is not yet approved or cleared by the [place] FDA and  has been authorized for detection and/or diagnosis of SARS-CoV-2 by FDA under an Emergency Use Authorization (EUA). This EUA will remain  in effect (meaning this test can be used) for the duration of the COVID-19 declaration under Section 56 4(b)(1) of the Act, 21 U.S.C. section 360bbb-3(b)(1), unless the authorization is terminated or revoked sooner. Performed at [facility], [place] [address] [place], [place] [address]      Radiology Studies: CT HEAD WO CONTRAST  Result Date: 06/24/2019 CLINICAL DATA:  Encephalopathy EXAM: CT HEAD WITHOUT CONTRAST TECHNIQUE: Contiguous axial images were obtained from the base of the skull through the vertex without intravenous contrast. COMPARISON:  None. FINDINGS: Brain: There is no mass, hemorrhage or extra-axial collection. There is generalized atrophy without lobar predilection. Hypodensity of the white matter is most commonly associated with chronic microvascular disease. There are multiple old small vessel infarcts of the deep gray nuclei. There is an old wedge-shaped left cerebellar infarct. Vascular: No abnormal hyperdensity of the major intracranial arteries or dural venous sinuses. No intracranial atherosclerosis. Skull: The visualized skull base, calvarium and extracranial soft tissues are normal. Sinuses/Orbits: No fluid levels or advanced mucosal thickening of the visualized paranasal sinuses. No mastoid or middle ear effusion. The orbits are normal. IMPRESSION: 1. No acute intracranial abnormality. 2. Multiple old small vessel infarcts and chronic ischemic microangiopathy. Electronically Signed   By: Ulyses Jarred M.D.   On:  06/24/2019 23:37   CT Angio Chest PE W and/or Wo Contrast  Result Date: 06/24/2019 CLINICAL DATA:  Shortness of breath for 2 days, abdominal pain, atrial fibrillation, COPD, CHF, post AVR/MVR, COVID negative EXAM: CT ANGIOGRAPHY CHEST WITH CONTRAST TECHNIQUE: Multidetector CT imaging of the chest was performed using the standard protocol during bolus administration of intravenous contrast. Multiplanar CT image reconstructions and MIPs were obtained to evaluate the vascular anatomy. CONTRAST:  129mL OMNIPAQUE IOHEXOL 350 MG/ML SOLN IV COMPARISON:  None FINDINGS: Cardiovascular: Atherosclerotic calcifications aorta, tortuous proximal great vessels, and coronary arteries. Post LEFT atrial appendage clipping. Aorta normal caliber without aneurysm or dissection. Enlargement of cardiac chambers. No pericardial effusion. Pulmonary arteries adequately opacified and patent. No evidence of pulmonary embolism. Mediastinum/Nodes: Diffuse enlargement of the LEFT thyroid lobe with enlarged lobe versus mass 4.6 x 3.2 cm extending retro manubrial. Additional calcified nodule LEFT lobe 13 mm diameter. Few mildly prominent mediastinal lymph nodes up to 13 mm short axis RIGHT paratracheal image 31 and 13 mm precarinal image 34. Esophagus unremarkable. Lungs/Pleura: Tiny BILATERAL pleural effusions with dependent atelectasis posterior RIGHT  lower lobe. Remaining lungs clear. No acute infiltrate or pneumothorax. Kyphosis Upper Abdomen: Large ventral hernia in epigastrium measuring up to 7.9 cm transverse and 8.2 cm craniocaudal. Herniation of gastric antrum and transverse colon into hernia sac without obstruction. Question lobulation of the anterior spleen versus isodense splenic mass 6 cm diameter. Musculoskeletal: Kyphosis of thoracic spine with scattered degenerative disc disease changes. Post median sternotomy. No acute osseous findings. Review of the MIP images confirms the above findings. IMPRESSION: No evidence of pulmonary  embolism. Tiny BILATERAL pleural effusions and mild dependent RIGHT basilar atelectasis. Diffuse enlargement of LEFT thyroid lobe extending retro manubrial with goiter versus 4.6 x 3.2 cm diameter inferior LEFT thyroid lobe mass; recommend non emergent ultrasound characterization. Large epigastric ventral hernia containing gastric antrum and transverse colon without obstruction. Extensive atherosclerotic calcifications including coronary arteries with postsurgical changes of AVR and MVR as well as Maze procedure. Suspect lobulated spleen but unable to completely exclude isodense anterior splenic mass; recommend follow-up ultrasound characterization. Aortic Atherosclerosis (ICD10-I70.0). Electronically Signed   By: Lavonia Dana M.D.   On: 06/24/2019 14:36   DG Chest Port 1 View  Result Date: 06/24/2019 CLINICAL DATA:  Shortness of breath, dyspnea EXAM: PORTABLE CHEST 1 VIEW COMPARISON:  02/09/2017 FINDINGS: Mild patchy opacity at the lateral right base. Very mild patchy opacity in the left mid lung. No pleural effusion or pneumothorax. Cardiomegaly.  Prosthetic valve.  Thoracic aortic atherosclerosis. Median sternotomy. IMPRESSION: Mild patchy opacities in the left upper lobe and right lower lobe, nonspecific but raising the possibility of mild infection. Electronically Signed   By: Julian Hy M.D.   On: 06/24/2019 11:26   ECHOCARDIOGRAM COMPLETE  Result Date: 06/25/2019   ECHOCARDIOGRAM REPORT   Patient Name:   JANDA SMOKE Date of Exam: 06/25/2019 Medical Rec #:  [id number]   Height:       60.0 in Accession #:    [id number]  Weight:       186.1 lb Date of Birth:  November 11, 1935   BSA:          1.81 m Patient Age:    29 years    BP:           94/76 mmHg Patient Gender: F           HR:           91 bpm. Exam Location:  [facility] Procedure: 2D Echo Indications:    Valvular heart disease [id number]  History:        Patient has prior history of Echocardiogram examinations, most                 recent 05/20/2017.  CHF, CAD, Arrythmias:Atrial Fibrillation,                 Signs/Symptoms:Dyspnea; Risk Factors:Former Smoker. AVR with                 pericardial valve, mitral annuloplasty, MAZE procedure and                 clipping of the LA appendage (02/2013).  Sonographer:    Leavy Cella RDCS (AE) Referring Phys: [place]  1. Left ventricular ejection fraction, by visual estimation, is 55 to 60%. The left ventricle has normal function. There is no left ventricular hypertrophy.  2. Left ventricular diastolic parameters are indeterminate in the setting of atrial fibrillation.  3. The left ventricle has no regional wall motion abnormalities.  4. Global right ventricle has  mildly reduced systolic function.The right ventricular size is normal. No increase in right ventricular wall thickness.  5. Left atrial size was severely dilated.  6. Right atrial size was severely dilated.  7. Presence of pericardial fat pad.  8. The mitral valve has been repaired/replaced. Trivial mitral valve regurgitation.  9. 26 mm Sorin 3-D Memo Ring in position. 10. The tricuspid valve is grossly normal. Tricuspid valve regurgitation is mild. 11. 21 mm Edwards Magna-ease pericardial valve in aortic position. Grossly normal function with no perivalvular leak. 12. Aortic valve mean gradient measures 12.3 mmHg. 13. Aortic valve regurgitation is not visualized. 14. The pulmonic valve was grossly normal. Pulmonic valve regurgitation is not visualized. 15. Moderately elevated pulmonary artery systolic pressure. 16. The inferior vena cava is normal in size with greater than 50% respiratory variability, suggesting right atrial pressure of 3 mmHg. 17. The tricuspid regurgitant velocity is 3.14 m/s, and with an assumed right atrial pressure of 3 mmHg, the estimated right ventricular systolic pressure is moderately elevated at 42.4 mmHg. FINDINGS  Left Ventricle: Left ventricular ejection fraction, by visual estimation, is 55 to  60%. The left ventricle has normal function. The left ventricle has no regional wall motion abnormalities. The left ventricular internal cavity size was the left ventricle is normal in size. There is no left ventricular hypertrophy. Left ventricular diastolic parameters are indeterminate. Right Ventricle: The right ventricular size is normal. No increase in right ventricular wall thickness. Global RV systolic function is has mildly reduced systolic function. The tricuspid regurgitant velocity is 3.14 m/s, and with an assumed right atrial pressure of 3 mmHg, the estimated right ventricular systolic pressure is moderately elevated at 42.4 mmHg. Left Atrium: Left atrial size was severely dilated. Right Atrium: Right atrial size was severely dilated Pericardium: There is no evidence of pericardial effusion. Presence of pericardial fat pad. Mitral Valve: The mitral valve has been repaired/replaced. Trivial mitral valve regurgitation. MV peak gradient, 17.5 mmHg. 26 mm Sorin 3-D Memo Ring in position. Tricuspid Valve: The tricuspid valve is grossly normal. Tricuspid valve regurgitation is mild. Aortic Valve: The aortic valve has been repaired/replaced. Aortic valve regurgitation is not visualized. Aortic valve mean gradient measures 12.3 mmHg. Aortic valve peak gradient measures 22.7 mmHg. Aortic valve area, by VTI measures 1.58 cm. 21 mm Edwards Magna-ease pericardial valve in position. Pulmonic Valve: The pulmonic valve was grossly normal. Pulmonic valve regurgitation is not visualized. Pulmonic regurgitation is not visualized. Aorta: The aortic root is normal in size and structure. Venous: The inferior vena cava is normal in size with greater than 50% respiratory variability, suggesting right atrial pressure of 3 mmHg. IAS/Shunts: No atrial level shunt detected by color flow Doppler.  LEFT VENTRICLE PLAX 2D LVOT diam:     1.80 cm  Diastology LVOT Area:     2.54 cm LV e' lateral:   5.96 cm/s                         LV  E/e' lateral: 37.3                         LV e' medial:    5.87 cm/s                         LV E/e' medial:  37.9  RIGHT VENTRICLE RV S prime:     11.10 cm/s TAPSE (M-mode): 1.4 cm LEFT ATRIUM  Index       RIGHT ATRIUM           Index LA Vol (A2C):   105.0 ml 58.00 ml/m RA Area:     28.20 cm LA Vol (A4C):   101.0 ml 55.80 ml/m RA Volume:   100.00 ml 55.24 ml/m LA Biplane Vol: 106.0 ml 58.56 ml/m  AORTIC VALVE AV Area (Vmax):    1.10 cm AV Area (Vmean):   1.16 cm AV Area (VTI):     1.58 cm AV Vmax:           238.33 cm/s AV Vmean:          159.667 cm/s AV VTI:            0.324 m AV Peak Grad:      22.7 mmHg AV Mean Grad:      12.3 mmHg LVOT Vmax:         102.87 cm/s LVOT Vmean:        72.833 cm/s LVOT VTI:          0.202 m LVOT/AV VTI ratio: 0.62 MITRAL VALVE                        TRICUSPID VALVE MV Area (PHT): 2.38 cm             TR Peak grad:   39.4 mmHg MV Peak grad:  17.5 mmHg            TR Vmax:        314.00 cm/s MV Mean grad:  7.0 mmHg MV Vmax:       2.09 m/s             SHUNTS MV Vmean:      114.0 cm/s           Systemic VTI:  0.20 m MV VTI:        0.56 m               Systemic Diam: 1.80 cm MV PHT:        92.61 msec MV Decel Time: 319 msec MV E velocity: 222.33 cm/s 103 cm/s  Rozann Lesches MD Electronically signed by Rozann Lesches MD Signature Date/Time: 06/25/2019/11:11:38 AM    Final     Scheduled Meds:  apixaban  5 mg Oral BID   atorvastatin  40 mg Oral q1800   diltiazem  240 mg Oral Daily   doxycycline  100 mg Oral Q12H   DULoxetine  20 mg Oral Daily   fluticasone furoate-vilanterol  1 puff Inhalation Daily   methylPREDNISolone (SOLU-MEDROL) injection  40 mg Intravenous Q12H   metoprolol tartrate  2.5 mg Intravenous Q8H   torsemide  60 mg Oral Daily   Continuous Infusions:   LOS: 0 days    Time spent: 30 minutes.    Barton Dubois, MD [facility] Pager [phone]   06/25/2019, 11:22 AM

## 2019-06-25 NOTE — Progress Notes (Signed)
*  PRELIMINARY RESULTS* Echocardiogram 2D Echocardiogram has been performed.  Leavy Cella 06/25/2019, 10:03 AM

## 2019-06-26 ENCOUNTER — Ambulatory Visit (HOSPITAL_COMMUNITY): Payer: Medicare Other | Admitting: Hematology

## 2019-06-26 ENCOUNTER — Ambulatory Visit (HOSPITAL_COMMUNITY)
Admission: RE | Admit: 2019-06-26 | Discharge: 2019-06-26 | Disposition: A | Discharge status: Home / Self Care | Payer: Medicare Other | Source: Ambulatory Visit | Attending: Cardiology | Admitting: Cardiology

## 2019-06-26 DIAGNOSIS — I5032 Chronic diastolic (congestive) heart failure: Secondary | ICD-10-CM

## 2019-06-26 DIAGNOSIS — E6609 Other obesity due to excess calories: Secondary | ICD-10-CM

## 2019-06-26 DIAGNOSIS — Z6836 Body mass index (BMI) 36.0-36.9, adult: Secondary | ICD-10-CM

## 2019-06-26 DIAGNOSIS — I509 Heart failure, unspecified: Secondary | ICD-10-CM

## 2019-06-26 DIAGNOSIS — J441 Chronic obstructive pulmonary disease with (acute) exacerbation: Secondary | ICD-10-CM

## 2019-06-26 DIAGNOSIS — E049 Nontoxic goiter, unspecified: Secondary | ICD-10-CM

## 2019-06-26 LAB — GLUCOSE, CAPILLARY
Glucose-Capillary: 142 mg/dL — ABNORMAL HIGH (ref 70–99)
Glucose-Capillary: 146 mg/dL — ABNORMAL HIGH (ref 70–99)

## 2019-06-26 LAB — T3, FREE: T3, Free: 2.1 pg/mL (ref 2.0–4.4)

## 2019-06-26 MED ORDER — DOXYCYCLINE HYCLATE 100 MG PO TABS: 100.0000 mg | ORAL_TABLET | Freq: Two times a day (BID) | ORAL | 0 refills | Status: AC

## 2019-06-26 MED ORDER — PREDNISONE 20 MG PO TABS: ORAL_TABLET | ORAL | 0 refills | Status: DC

## 2019-06-26 MED ORDER — METOPROLOL SUCCINATE ER 25 MG PO TB24
25.0000 mg | ORAL_TABLET | Freq: Every day | ORAL | Status: DC
  Administered 2019-06-26: 25 mg via ORAL
  Filled 2019-06-26: qty 1

## 2019-06-26 MED ORDER — METOPROLOL SUCCINATE ER 25 MG PO TB24: 25.0000 mg | ORAL_TABLET | Freq: Every day | ORAL | 1 refills | Status: DC

## 2019-06-26 NOTE — Discharge Summary (Signed)
Physician Discharge Summary  Nicole [name] [id number] DOB: Oct 18, 1935 DOA: 06/24/2019  PCP: Patient, No Pcp Per  Admit date: 06/24/2019 Discharge date: 06/26/2019  Time spent: 35 minutes  Recommendations for Outpatient Follow-up:  1. Thyroid ultrasound with subsequent follow-up by endocrinology service as needed. 2. -Repeat basic metabolic panel to follow electrolytes and renal function 3. Reassess blood pressure and further adjust antihypertensive medication as needed.   Discharge Diagnoses:  Active Problems:   Dyspnea   SOB (shortness of breath)   Acute on chronic congestive heart failure (HCC)   COPD exacerbation (HCC)   Thyroid enlarged   Class 2 obesity due to excess calories with body mass index (BMI) of 36.0 to 36.9 in adult   Discharge Condition: Stable and improved.  Discharged home with instruction to follow-up with PCP/cardiology service and with endocrinology.  Diet recommendation: Low calorie diet and low-sodium/heart healthy.  Filed Weights   06/24/19 1053 06/24/19 1844  Weight: 80.3 kg 84.4 kg    History of present illness:  As per H&P written by Dr. Denton [name] on 06/24/2019 84 y.o.femalewith medical history significant forCOPD, atria fibrillation, CHF.Daughter is present at bedside and helps with history. Presented to the ED with c/o difficultybreathing over the past 2 days,symptom is worse with activity and improves with rest. She denies cough. No wheezing. No fever no chills. No chest pain. She reports compliance with her medications including Eliquis, torsemide which was recently reduced from80 mg to60 mg daily. No lower extremity swelling.Reports intentional weight loss over the past year.  06/11/2019, patient was on her way for her oncology visit,with her daughter when she had a 5-7 4-minute episode of confusion. Patient was able to talkclearly,but was disoriented, unable to tell the year.Most of this had resolved by the time she arrived at  her oncologist office. There was no extremity weakness, no facial asymmetry noted. Head CT was planned as outpatient,and is pending.  ED Course:Tachycardic120s-140s in the ED,improved with 10 mg Cardizem.Blood pressure systolic [id number].BNP elevated at 527. EKG showing atrial fibrillation rate 130s. CTA chest negative for PE shows tiny bilateral pleural effusions and mild dependent right basilar atelectasis. IV Lasix 40 mg x 1 given, Solu-Medrol 125 mg given.Hospitalist to admit for further evaluation and management.   Hospital Course:  1-acute respiratory failure with hypoxia in the setting of COPD exacerbation and A. fib with RVR -Significantly improved currently and patient expressing much improvement in her breathing. -Denies orthopnea, palpitations or chest pain. -She is currently no wheezing and not using accessory muscle. -Patient will complete steroids tapering and resume home as needed bronchodilators and nebulizer management. -Doxycycline orally to complete a total of 5 days will be provided for any underlying bronchitis/bronchiectasis component.    3 more days pending at discharge. -Patient is stabilized and as per recommended by cardiology service will discharge home with follow-up in 2 weeks by Dr. Benjamine Mola -Continue to follow electrolytes and replete as needed  2-A. fib with RVR -Chronic atrial fibrillation -Patient previously on amiodarone which has been stopped because of junctional rhythm and thyroid problems.   -Patient is also status post maze surgery. -At home using Cardizem and is chronically on Eliquis. -Patient failed cardioversion in 2018. -Rate significantly improved at this moment after initiation of Toprol 25 mg daily. -Patient will follow up with cardiology service as an outpatient and if needed reassessment by EP.  3-goiter/hypothyroidism -TSH marginally abnormal -Free T4 1.19 and T3 2.1 -Patient will be now discharged on beta-blocker  medication and also a steroid tapering for  her COPD; if there is any component of acute thyroiditis as of subsequent from amiodarone use this could be sufficient hospital treatment. -Patient reports no other red flag symptoms from thyroid dysfunction currently. -Due to abnormal CT images will recommend outpatient thyroid ultrasound and follow-up with endocrinology service.  4-thrombocytosis -Continue outpatient follow-up with hematology service. -Appears to be stable. -Patient chronically on Eliquis.  5-acute on chronic diastolic heart failure -Patient received a one-time dose of IV Lasix during admission process; subsequently resume Demadex dose readjusted at 60 mg daily. -Close monitoring of INO's and low-sodium diet provided. -Patient euvolemic at discharge. -See below for 2D echo results.  6-hyperlipidemia -Continue statins.  7-class II obesity -Body mass index is 36.34 kg/m. -Low calorie diet, portion control and increase physical activity discussed with patient.  Procedures:  See below for x-ray reports  -2D echo:  1. Left ventricular ejection fraction, by visual estimation, is 55 to 60%. The left ventricle has normal function. There is no left ventricular hypertrophy.  2. Left ventricular diastolic parameters are indeterminate in the setting of atrial fibrillation.  3. The left ventricle has no regional wall motion abnormalities.  4. Global right ventricle has mildly reduced systolic function.The right ventricular size is normal. No increase in right ventricular wall thickness.  5. Left atrial size was severely dilated.  6. Right atrial size was severely dilated.  7. Presence of pericardial fat pad.  8. The mitral valve has been repaired/replaced. Trivial mitral valve regurgitation.  9. 26 mm Sorin 3-D Memo Ring in position. 10. The tricuspid valve is grossly normal. Tricuspid valve regurgitation is mild. 11. 21 mm Edwards Magna-ease pericardial valve in aortic  position. Grossly normal function with no perivalvular leak. 12. Aortic valve mean gradient measures 12.3 mmHg. 13. Aortic valve regurgitation is not visualized. 14. The pulmonic valve was grossly normal. Pulmonic valve regurgitation is not visualized. 15. Moderately elevated pulmonary artery systolic pressure. 16. The inferior vena cava is normal in size with greater than 50% respiratory variability, suggesting right atrial pressure of 3 mmHg. 17. The tricuspid regurgitant velocity is 3.14 m/s, and with an assumed right atrial pressure of 3 mmHg, the estimated right ventricular systolic pressure is moderately elevated at 42.4 mmHg.  Consultations:  Cardiology service  Discharge Exam: Vitals:   06/26/19 0812 06/26/19 1149  BP:    Pulse:    Resp:    Temp:    SpO2: 98% 94%    General: Afebrile, no chest pain, no nausea, no vomiting.  Reports breathing back to baseline and is currently no complaining of palpitations or heart racing/skipping beats feelings. Cardiovascular: Rate controlled, no rubs, no gallops, soft systolic ejection murmur appreciated on exam.  No JVD. Respiratory: Positive scattered rhonchi, no wheezing, no using accessory muscles, no crackles.  Normal respiratory effort.  Good O2 sat on room air. Abdomen: Soft, nontender, nondistended, positive bowel sounds Extremities: No edema, no cyanosis or clubbing.  Discharge Instructions   Discharge Instructions    (HEART FAILURE PATIENTS) Call MD:  Anytime you have any of the following symptoms: 1) 3 pound weight gain in 24 hours or 5 pounds in 1 week 2) shortness of breath, with or without a dry hacking cough 3) swelling in the hands, feet or stomach 4) if you have to sleep on extra pillows at night in order to breathe.   Complete by: As directed    Diet - low sodium heart healthy   Complete by: As directed    Discharge instructions   Complete  by: As directed    Take medications as prescribed Follow-up with PCP in 10  days Contact cardiology office for follow-up in 2 weeks with Dr. Aundra Dubin as recommended by Dr. Domenic Polite. Continue to check your weight on daily basis -Follow low-sodium diet.     Allergies as of 06/26/2019   No Known Allergies     Medication List    TAKE these medications   albuterol 108 (90 Base) MCG/ACT inhaler Commonly known as: VENTOLIN HFA Inhale 2 puffs into the lungs as needed.   Aleve 220 MG tablet Generic drug: naproxen sodium Take 220 mg by mouth daily as needed (for pain).   atorvastatin 40 MG tablet Commonly known as: LIPITOR TAKE 1 TABLET BY MOUTH EVERY DAY IN THE EVENING   Breo Ellipta 100-25 MCG/INH Aepb Generic drug: fluticasone furoate-vilanterol Inhale 1 puff into the lungs daily.   diltiazem 240 MG 24 hr capsule Commonly known as: CARDIZEM CD TAKE 1 CAPSULE BY MOUTH EVERY DAY   doxycycline 100 MG tablet Commonly known as: VIBRA-TABS Take 1 tablet (100 mg total) by mouth every 12 (twelve) hours for 3 days.   DULoxetine 20 MG capsule Commonly known as: CYMBALTA Take 1 capsule (20 mg total) by mouth daily.   Eliquis 5 MG Tabs tablet Generic drug: apixaban TAKE 1 TABLET BY MOUTH TWICE A DAY   ferrous sulfate 325 (65 FE) MG EC tablet Take 325 mg by mouth daily with breakfast.   Klor-Con M20 20 MEQ tablet Generic drug: potassium chloride SA TAKE 2 TABLETS BY MOUTH IN THE MORNING, THEN 1 TABLET IN THE EVENING   metoprolol succinate 25 MG 24 hr tablet Commonly known as: TOPROL-XL Take 1 tablet (25 mg total) by mouth daily. Start taking on: June 27, 2019   multivitamin with minerals tablet Take 1 tablet by mouth daily.   predniSONE 20 MG tablet Commonly known as: Deltasone Take 3 tablets by mouth daily x1 day; then 2 tablets by mouth daily x2 days; then 1 tablet by mouth daily x2 days; then half tablet by mouth daily x3 days and stop prednisone.   torsemide 20 MG tablet Commonly known as: DEMADEX Take 3 tablets (60 mg total) by mouth  daily.      No Known Allergies Follow-up Information    [name], MD Follow up in 2 week(s).   Specialty: Cardiology Contact information: [place] [address] [phone]        Philemon Kingdom, MD. Schedule an appointment as soon as possible for a visit in 3 week(s).   Specialty: Internal Medicine Contact information: [address] [place] [address] [phone]           The results of significant diagnostics from this hospitalization (including imaging, microbiology, ancillary and laboratory) are listed below for reference.    Significant Diagnostic Studies: CT HEAD WO CONTRAST  Result Date: 06/24/2019 CLINICAL DATA:  Encephalopathy EXAM: CT HEAD WITHOUT CONTRAST TECHNIQUE: Contiguous axial images were obtained from the base of the skull through the vertex without intravenous contrast. COMPARISON:  None. FINDINGS: Brain: There is no mass, hemorrhage or extra-axial collection. There is generalized atrophy without lobar predilection. Hypodensity of the white matter is most commonly associated with chronic microvascular disease. There are multiple old small vessel infarcts of the deep gray nuclei. There is an old wedge-shaped left cerebellar infarct. Vascular: No abnormal hyperdensity of the major intracranial arteries or dural venous sinuses. No intracranial atherosclerosis. Skull: The visualized skull base, calvarium  and extracranial soft tissues are normal. Sinuses/Orbits: No fluid levels or advanced mucosal thickening of the visualized paranasal sinuses. No mastoid or middle ear effusion. The orbits are normal. IMPRESSION: 1. No acute intracranial abnormality. 2. Multiple old small vessel infarcts and chronic ischemic microangiopathy. Electronically Signed   By: Ulyses Jarred M.D.   On: 06/24/2019 23:37   CT Angio Chest PE W and/or Wo Contrast  Result Date: 06/24/2019 CLINICAL DATA:  Shortness of breath for 2 days,  abdominal pain, atrial fibrillation, COPD, CHF, post AVR/MVR, COVID negative EXAM: CT ANGIOGRAPHY CHEST WITH CONTRAST TECHNIQUE: Multidetector CT imaging of the chest was performed using the standard protocol during bolus administration of intravenous contrast. Multiplanar CT image reconstructions and MIPs were obtained to evaluate the vascular anatomy. CONTRAST:  128mL OMNIPAQUE IOHEXOL 350 MG/ML SOLN IV COMPARISON:  None FINDINGS: Cardiovascular: Atherosclerotic calcifications aorta, tortuous proximal great vessels, and coronary arteries. Post LEFT atrial appendage clipping. Aorta normal caliber without aneurysm or dissection. Enlargement of cardiac chambers. No pericardial effusion. Pulmonary arteries adequately opacified and patent. No evidence of pulmonary embolism. Mediastinum/Nodes: Diffuse enlargement of the LEFT thyroid lobe with enlarged lobe versus mass 4.6 x 3.2 cm extending retro manubrial. Additional calcified nodule LEFT lobe 13 mm diameter. Few mildly prominent mediastinal lymph nodes up to 13 mm short axis RIGHT paratracheal image 31 and 13 mm precarinal image 34. Esophagus unremarkable. Lungs/Pleura: Tiny BILATERAL pleural effusions with dependent atelectasis posterior RIGHT lower lobe. Remaining lungs clear. No acute infiltrate or pneumothorax. Kyphosis Upper Abdomen: Large ventral hernia in epigastrium measuring up to 7.9 cm transverse and 8.2 cm craniocaudal. Herniation of gastric antrum and transverse colon into hernia sac without obstruction. Question lobulation of the anterior spleen versus isodense splenic mass 6 cm diameter. Musculoskeletal: Kyphosis of thoracic spine with scattered degenerative disc disease changes. Post median sternotomy. No acute osseous findings. Review of the MIP images confirms the above findings. IMPRESSION: No evidence of pulmonary embolism. Tiny BILATERAL pleural effusions and mild dependent RIGHT basilar atelectasis. Diffuse enlargement of LEFT thyroid lobe  extending retro manubrial with goiter versus 4.6 x 3.2 cm diameter inferior LEFT thyroid lobe mass; recommend non emergent ultrasound characterization. Large epigastric ventral hernia containing gastric antrum and transverse colon without obstruction. Extensive atherosclerotic calcifications including coronary arteries with postsurgical changes of AVR and MVR as well as Maze procedure. Suspect lobulated spleen but unable to completely exclude isodense anterior splenic mass; recommend follow-up ultrasound characterization. Aortic Atherosclerosis (ICD10-I70.0). Electronically Signed   By: Lavonia Dana M.D.   On: 06/24/2019 14:36   DG Chest Port 1 View  Result Date: 06/24/2019 CLINICAL DATA:  Shortness of breath, dyspnea EXAM: PORTABLE CHEST 1 VIEW COMPARISON:  02/09/2017 FINDINGS: Mild patchy opacity at the lateral right base. Very mild patchy opacity in the left mid lung. No pleural effusion or pneumothorax. Cardiomegaly.  Prosthetic valve.  Thoracic aortic atherosclerosis. Median sternotomy. IMPRESSION: Mild patchy opacities in the left upper lobe and right lower lobe, nonspecific but raising the possibility of mild infection. Electronically Signed   By: Julian Hy M.D.   On: 06/24/2019 11:26   ECHOCARDIOGRAM COMPLETE  Result Date: 06/25/2019   ECHOCARDIOGRAM REPORT   Patient Name:   JALYAH HESSLER Date of Exam: 06/25/2019 Medical Rec #:  [id number]   Height:       60.0 in Accession #:    [id number]  Weight:       186.1 lb Date of Birth:  [birth year]   BSA:  1.81 m Patient Age:    84 years    BP:           94/76 mmHg Patient Gender: F           HR:           91 bpm. Exam Location:  [facility] Procedure: 2D Echo Indications:    Valvular heart disease [id number]  History:        Patient has prior history of Echocardiogram examinations, most                 recent 05/20/2017. CHF, CAD, Arrythmias:Atrial Fibrillation,                 Signs/Symptoms:Dyspnea; Risk Factors:Former Smoker. AVR with                  pericardial valve, mitral annuloplasty, MAZE procedure and                 clipping of the LA appendage (02/2013).  Sonographer:    Leavy Cella RDCS (AE) Referring Phys: [place]  1. Left ventricular ejection fraction, by visual estimation, is 55 to 60%. The left ventricle has normal function. There is no left ventricular hypertrophy.  2. Left ventricular diastolic parameters are indeterminate in the setting of atrial fibrillation.  3. The left ventricle has no regional wall motion abnormalities.  4. Global right ventricle has mildly reduced systolic function.The right ventricular size is normal. No increase in right ventricular wall thickness.  5. Left atrial size was severely dilated.  6. Right atrial size was severely dilated.  7. Presence of pericardial fat pad.  8. The mitral valve has been repaired/replaced. Trivial mitral valve regurgitation.  9. 26 mm Sorin 3-D Memo Ring in position. 10. The tricuspid valve is grossly normal. Tricuspid valve regurgitation is mild. 11. 21 mm Edwards Magna-ease pericardial valve in aortic position. Grossly normal function with no perivalvular leak. 12. Aortic valve mean gradient measures 12.3 mmHg. 13. Aortic valve regurgitation is not visualized. 14. The pulmonic valve was grossly normal. Pulmonic valve regurgitation is not visualized. 15. Moderately elevated pulmonary artery systolic pressure. 16. The inferior vena cava is normal in size with greater than 50% respiratory variability, suggesting right atrial pressure of 3 mmHg. 17. The tricuspid regurgitant velocity is 3.14 m/s, and with an assumed right atrial pressure of 3 mmHg, the estimated right ventricular systolic pressure is moderately elevated at 42.4 mmHg. FINDINGS  Left Ventricle: Left ventricular ejection fraction, by visual estimation, is 55 to 60%. The left ventricle has normal function. The left ventricle has no regional wall motion abnormalities. The left ventricular  internal cavity size was the left ventricle is normal in size. There is no left ventricular hypertrophy. Left ventricular diastolic parameters are indeterminate. Right Ventricle: The right ventricular size is normal. No increase in right ventricular wall thickness. Global RV systolic function is has mildly reduced systolic function. The tricuspid regurgitant velocity is 3.14 m/s, and with an assumed right atrial pressure of 3 mmHg, the estimated right ventricular systolic pressure is moderately elevated at 42.4 mmHg. Left Atrium: Left atrial size was severely dilated. Right Atrium: Right atrial size was severely dilated Pericardium: There is no evidence of pericardial effusion. Presence of pericardial fat pad. Mitral Valve: The mitral valve has been repaired/replaced. Trivial mitral valve regurgitation. MV peak gradient, 17.5 mmHg. 26 mm Sorin 3-D Memo Ring in position. Tricuspid Valve: The tricuspid valve is grossly normal. Tricuspid valve regurgitation is  mild. Aortic Valve: The aortic valve has been repaired/replaced. Aortic valve regurgitation is not visualized. Aortic valve mean gradient measures 12.3 mmHg. Aortic valve peak gradient measures 22.7 mmHg. Aortic valve area, by VTI measures 1.58 cm. 21 mm Edwards Magna-ease pericardial valve in position. Pulmonic Valve: The pulmonic valve was grossly normal. Pulmonic valve regurgitation is not visualized. Pulmonic regurgitation is not visualized. Aorta: The aortic root is normal in size and structure. Venous: The inferior vena cava is normal in size with greater than 50% respiratory variability, suggesting right atrial pressure of 3 mmHg. IAS/Shunts: No atrial level shunt detected by color flow Doppler.  LEFT VENTRICLE PLAX 2D LVOT diam:     1.80 cm  Diastology LVOT Area:     2.54 cm LV e' lateral:   5.96 cm/s                         LV E/e' lateral: 37.3                         LV e' medial:    5.87 cm/s                         LV E/e' medial:  37.9  RIGHT  VENTRICLE RV S prime:     11.10 cm/s TAPSE (M-mode): 1.4 cm LEFT ATRIUM              Index       RIGHT ATRIUM           Index LA Vol (A2C):   105.0 ml 58.00 ml/m RA Area:     28.20 cm LA Vol (A4C):   101.0 ml 55.80 ml/m RA Volume:   100.00 ml 55.24 ml/m LA Biplane Vol: 106.0 ml 58.56 ml/m  AORTIC VALVE AV Area (Vmax):    1.10 cm AV Area (Vmean):   1.16 cm AV Area (VTI):     1.58 cm AV Vmax:           238.33 cm/s AV Vmean:          159.667 cm/s AV VTI:            0.324 m AV Peak Grad:      22.7 mmHg AV Mean Grad:      12.3 mmHg LVOT Vmax:         102.87 cm/s LVOT Vmean:        72.833 cm/s LVOT VTI:          0.202 m LVOT/AV VTI ratio: 0.62 MITRAL VALVE                        TRICUSPID VALVE MV Area (PHT): 2.38 cm             TR Peak grad:   39.4 mmHg MV Peak grad:  17.5 mmHg            TR Vmax:        314.00 cm/s MV Mean grad:  7.0 mmHg MV Vmax:       2.09 m/s             SHUNTS MV Vmean:      114.0 cm/s           Systemic VTI:  0.20 m MV VTI:        0.56 m  Systemic Diam: 1.80 cm MV PHT:        92.61 msec MV Decel Time: 319 msec MV E velocity: 222.33 cm/s 103 cm/s  Rozann Lesches MD Electronically signed by Rozann Lesches MD Signature Date/Time: 06/25/2019/11:11:38 AM    Final     Microbiology: Recent Results (from the past 240 hour(s))  SARS CORONAVIRUS 2 (TAT 6-24 HRS) Nasopharyngeal Nasopharyngeal Swab     Status: None   Collection Time: 06/24/19  4:55 PM   Specimen: Nasopharyngeal Swab  Result Value Ref Range Status   SARS Coronavirus 2 NEGATIVE NEGATIVE Final    Comment: (NOTE) SARS-CoV-2 target nucleic acids are NOT DETECTED. The SARS-CoV-2 RNA is generally detectable in upper and lower respiratory specimens during the acute phase of infection. Negative results do not preclude SARS-CoV-2 infection, do not rule out co-infections with other pathogens, and should not be used as the sole basis for treatment or other patient management decisions. Negative results must be  combined with clinical observations, patient history, and epidemiological information. The expected result is Negative. Fact Sheet for Patients: SugarRoll.be Fact Sheet for Healthcare Providers: https://www.woods-mathews.com/ This test is not yet approved or cleared by the [place] FDA and  has been authorized for detection and/or diagnosis of SARS-CoV-2 by FDA under an Emergency Use Authorization (EUA). This EUA will remain  in effect (meaning this test can be used) for the duration of the COVID-19 declaration under Section 56 4(b)(1) of the Act, 21 U.S.C. section 360bbb-3(b)(1), unless the authorization is terminated or revoked sooner. Performed at [facility], [place] [address] [place], [place] [address]      Labs: Basic Metabolic Panel: Recent Labs  Lab 06/24/19 1100 06/24/19 1852 06/25/19 0455  NA 139  --  140  K 4.3  --  4.2  CL 106  --  105  CO2 24  --  23  GLUCOSE 115*  --  164*  BUN 11  --  19  CREATININE 0.98  --  0.93  CALCIUM 9.1  --  8.7*  MG  --  2.3  --    CBC: Recent Labs  Lab 06/24/19 1100 06/25/19 0455  WBC 15.5* 18.7*  HGB 11.0* 10.0*  HCT 35.7* 32.8*  MCV 96.7 96.5  PLT 1,680* 1,522*    BNP (last 3 results) Recent Labs    06/24/19 1100  BNP 527.0*   CBG: Recent Labs  Lab 06/25/19 1101 06/25/19 1621 06/25/19 2102 06/26/19 0711 06/26/19 1107  GLUCAP 134* 152* 163* 142* 146*    Signed:  Barton Dubois MD.  [facility] 06/26/2019, 1:36 PM

## 2019-06-26 NOTE — Progress Notes (Signed)
Discharge instructions reviewed with patient and patient's daughter. Both verbalized understanding of instructions. Patient discharged home with daughter in stable condition.

## 2019-06-26 NOTE — Progress Notes (Addendum)
Progress Note  Patient Name: Nicole [name] Date of Encounter: 06/26/2019  Primary Cardiologist: [name], MD  Subjective   No chest pain or palpitations. No breathlessness at rest.  Inpatient Medications    Scheduled Meds: . apixaban  5 mg Oral BID  . atorvastatin  40 mg Oral q1800  . diltiazem  240 mg Oral Daily  . doxycycline  100 mg Oral Q12H  . DULoxetine  20 mg Oral Daily  . fluticasone furoate-vilanterol  1 puff Inhalation Daily  . methylPREDNISolone (SOLU-MEDROL) injection  40 mg Intravenous Q12H  . metoprolol succinate  25 mg Oral Daily  . torsemide  60 mg Oral Daily    PRN Meds: acetaminophen **OR** acetaminophen, albuterol, ondansetron **OR** ondansetron (ZOFRAN) IV, polyethylene glycol   Vital Signs    Vitals:   06/25/19 2104 06/25/19 2159 06/26/19 0507 06/26/19 0812  BP: (!) 91/49 (!) 102/59 91/77   Pulse: 75 81 77   Resp: 18  20   Temp: 98.6 F (37 C)  (!) 97.5 F (36.4 C)   TempSrc: Oral  Oral   SpO2: 98%  98% 98%  Weight:      Height:        Intake/Output Summary (Last 24 hours) at 06/26/2019 0903 Last data filed at 06/26/2019 0543 Gross per 24 hour  Intake 480 ml  Output 2400 ml  Net -1920 ml   Filed Weights   06/24/19 1053 06/24/19 1844  Weight: 80.3 kg 84.4 kg    Telemetry    Atrial fibrillation in the 90-100 range. PVCs. Personally reviewed.  ECG    An ECG dated 06/24/2019 was personally reviewed today and demonstrated:  Rapid atrial fibrillation.  Physical Exam   GEN: Elderly woman, no acute distress. Neck: No JVD. Cardiac: Irregularly irregular, 2/6 systolic murmur, no gallop.  Respiratory: Nonlabored. No wheezes.Marland Kitchen GI: Large ventral hernia, bowel sounds present. MS: No edema; No deformity.  Labs    Chemistry Recent Labs  Lab 06/24/19 1100 06/25/19 0455  NA 139 140  K 4.3 4.2  CL 106 105  CO2 24 23  GLUCOSE 115* 164*  BUN 11 19  CREATININE 0.98 0.93  CALCIUM 9.1 8.7*  GFRNONAA 54* 57*  GFRAA >60 >60   ANIONGAP 9 12     Hematology Recent Labs  Lab 06/24/19 1100 06/25/19 0455  WBC 15.5* 18.7*  RBC 3.69* 3.40*  HGB 11.0* 10.0*  HCT 35.7* 32.8*  MCV 96.7 96.5  MCH 29.8 29.4  MCHC 30.8 30.5  RDW 15.4 15.2  PLT 1,680* 1,522*    Cardiac Enzymes Recent Labs  Lab 06/24/19 1852 06/24/19 2057  TROPONINIHS 10 9    BNP Recent Labs  Lab 06/24/19 1100  BNP 527.0*     Radiology    CT HEAD WO CONTRAST  Result Date: 06/24/2019 CLINICAL DATA:  Encephalopathy EXAM: CT HEAD WITHOUT CONTRAST TECHNIQUE: Contiguous axial images were obtained from the base of the skull through the vertex without intravenous contrast. COMPARISON:  None. FINDINGS: Brain: There is no mass, hemorrhage or extra-axial collection. There is generalized atrophy without lobar predilection. Hypodensity of the white matter is most commonly associated with chronic microvascular disease. There are multiple old small vessel infarcts of the deep gray nuclei. There is an old wedge-shaped left cerebellar infarct. Vascular: No abnormal hyperdensity of the major intracranial arteries or dural venous sinuses. No intracranial atherosclerosis. Skull: The visualized skull base, calvarium and extracranial soft tissues are normal. Sinuses/Orbits: No fluid levels or advanced mucosal thickening of the  visualized paranasal sinuses. No mastoid or middle ear effusion. The orbits are normal. IMPRESSION: 1. No acute intracranial abnormality. 2. Multiple old small vessel infarcts and chronic ischemic microangiopathy. Electronically Signed   By: Ulyses Jarred M.D.   On: 06/24/2019 23:37   CT Angio Chest PE W and/or Wo Contrast  Result Date: 06/24/2019 CLINICAL DATA:  Shortness of breath for 2 days, abdominal pain, atrial fibrillation, COPD, CHF, post AVR/MVR, COVID negative EXAM: CT ANGIOGRAPHY CHEST WITH CONTRAST TECHNIQUE: Multidetector CT imaging of the chest was performed using the standard protocol during bolus administration of intravenous  contrast. Multiplanar CT image reconstructions and MIPs were obtained to evaluate the vascular anatomy. CONTRAST:  128mL OMNIPAQUE IOHEXOL 350 MG/ML SOLN IV COMPARISON:  None FINDINGS: Cardiovascular: Atherosclerotic calcifications aorta, tortuous proximal great vessels, and coronary arteries. Post LEFT atrial appendage clipping. Aorta normal caliber without aneurysm or dissection. Enlargement of cardiac chambers. No pericardial effusion. Pulmonary arteries adequately opacified and patent. No evidence of pulmonary embolism. Mediastinum/Nodes: Diffuse enlargement of the LEFT thyroid lobe with enlarged lobe versus mass 4.6 x 3.2 cm extending retro manubrial. Additional calcified nodule LEFT lobe 13 mm diameter. Few mildly prominent mediastinal lymph nodes up to 13 mm short axis RIGHT paratracheal image 31 and 13 mm precarinal image 34. Esophagus unremarkable. Lungs/Pleura: Tiny BILATERAL pleural effusions with dependent atelectasis posterior RIGHT lower lobe. Remaining lungs clear. No acute infiltrate or pneumothorax. Kyphosis Upper Abdomen: Large ventral hernia in epigastrium measuring up to 7.9 cm transverse and 8.2 cm craniocaudal. Herniation of gastric antrum and transverse colon into hernia sac without obstruction. Question lobulation of the anterior spleen versus isodense splenic mass 6 cm diameter. Musculoskeletal: Kyphosis of thoracic spine with scattered degenerative disc disease changes. Post median sternotomy. No acute osseous findings. Review of the MIP images confirms the above findings. IMPRESSION: No evidence of pulmonary embolism. Tiny BILATERAL pleural effusions and mild dependent RIGHT basilar atelectasis. Diffuse enlargement of LEFT thyroid lobe extending retro manubrial with goiter versus 4.6 x 3.2 cm diameter inferior LEFT thyroid lobe mass; recommend non emergent ultrasound characterization. Large epigastric ventral hernia containing gastric antrum and transverse colon without obstruction.  Extensive atherosclerotic calcifications including coronary arteries with postsurgical changes of AVR and MVR as well as Maze procedure. Suspect lobulated spleen but unable to completely exclude isodense anterior splenic mass; recommend follow-up ultrasound characterization. Aortic Atherosclerosis (ICD10-I70.0). Electronically Signed   By: Lavonia Dana M.D.   On: 06/24/2019 14:36   DG Chest Port 1 View  Result Date: 06/24/2019 CLINICAL DATA:  Shortness of breath, dyspnea EXAM: PORTABLE CHEST 1 VIEW COMPARISON:  02/09/2017 FINDINGS: Mild patchy opacity at the lateral right base. Very mild patchy opacity in the left mid lung. No pleural effusion or pneumothorax. Cardiomegaly.  Prosthetic valve.  Thoracic aortic atherosclerosis. Median sternotomy. IMPRESSION: Mild patchy opacities in the left upper lobe and right lower lobe, nonspecific but raising the possibility of mild infection. Electronically Signed   By: Julian Hy M.D.   On: 06/24/2019 11:26   ECHOCARDIOGRAM COMPLETE  Result Date: 06/25/2019   ECHOCARDIOGRAM REPORT   Patient Name:   KIPPIE RIMMER Date of Exam: 06/25/2019 Medical Rec #:  [id number]   Height:       60.0 in Accession #:    [id number]  Weight:       186.1 lb Date of Birth:  Mar 30, 1936   BSA:          1.81 m Patient Age:    89 years    BP:  94/76 mmHg Patient Gender: F           HR:           91 bpm. Exam Location:  [facility] Procedure: 2D Echo Indications:    Valvular heart disease [id number]  History:        Patient has prior history of Echocardiogram examinations, most                 recent 05/20/2017. CHF, CAD, Arrythmias:Atrial Fibrillation,                 Signs/Symptoms:Dyspnea; Risk Factors:Former Smoker. AVR with                 pericardial valve, mitral annuloplasty, MAZE procedure and                 clipping of the LA appendage (02/2013).  Sonographer:    Leavy Cella RDCS (AE) Referring Phys: [place]  1. Left ventricular ejection  fraction, by visual estimation, is 55 to 60%. The left ventricle has normal function. There is no left ventricular hypertrophy.  2. Left ventricular diastolic parameters are indeterminate in the setting of atrial fibrillation.  3. The left ventricle has no regional wall motion abnormalities.  4. Global right ventricle has mildly reduced systolic function.The right ventricular size is normal. No increase in right ventricular wall thickness.  5. Left atrial size was severely dilated.  6. Right atrial size was severely dilated.  7. Presence of pericardial fat pad.  8. The mitral valve has been repaired/replaced. Trivial mitral valve regurgitation.  9. 26 mm Sorin 3-D Memo Ring in position. 10. The tricuspid valve is grossly normal. Tricuspid valve regurgitation is mild. 11. 21 mm Edwards Magna-ease pericardial valve in aortic position. Grossly normal function with no perivalvular leak. 12. Aortic valve mean gradient measures 12.3 mmHg. 13. Aortic valve regurgitation is not visualized. 14. The pulmonic valve was grossly normal. Pulmonic valve regurgitation is not visualized. 15. Moderately elevated pulmonary artery systolic pressure. 16. The inferior vena cava is normal in size with greater than 50% respiratory variability, suggesting right atrial pressure of 3 mmHg. 17. The tricuspid regurgitant velocity is 3.14 m/s, and with an assumed right atrial pressure of 3 mmHg, the estimated right ventricular systolic pressure is moderately elevated at 42.4 mmHg. FINDINGS  Left Ventricle: Left ventricular ejection fraction, by visual estimation, is 55 to 60%. The left ventricle has normal function. The left ventricle has no regional wall motion abnormalities. The left ventricular internal cavity size was the left ventricle is normal in size. There is no left ventricular hypertrophy. Left ventricular diastolic parameters are indeterminate. Right Ventricle: The right ventricular size is normal. No increase in right ventricular  wall thickness. Global RV systolic function is has mildly reduced systolic function. The tricuspid regurgitant velocity is 3.14 m/s, and with an assumed right atrial pressure of 3 mmHg, the estimated right ventricular systolic pressure is moderately elevated at 42.4 mmHg. Left Atrium: Left atrial size was severely dilated. Right Atrium: Right atrial size was severely dilated Pericardium: There is no evidence of pericardial effusion. Presence of pericardial fat pad. Mitral Valve: The mitral valve has been repaired/replaced. Trivial mitral valve regurgitation. MV peak gradient, 17.5 mmHg. 26 mm Sorin 3-D Memo Ring in position. Tricuspid Valve: The tricuspid valve is grossly normal. Tricuspid valve regurgitation is mild. Aortic Valve: The aortic valve has been repaired/replaced. Aortic valve regurgitation is not visualized. Aortic valve mean gradient measures 12.3 mmHg. Aortic  valve peak gradient measures 22.7 mmHg. Aortic valve area, by VTI measures 1.58 cm. 21 mm Edwards Magna-ease pericardial valve in position. Pulmonic Valve: The pulmonic valve was grossly normal. Pulmonic valve regurgitation is not visualized. Pulmonic regurgitation is not visualized. Aorta: The aortic root is normal in size and structure. Venous: The inferior vena cava is normal in size with greater than 50% respiratory variability, suggesting right atrial pressure of 3 mmHg. IAS/Shunts: No atrial level shunt detected by color flow Doppler.  LEFT VENTRICLE PLAX 2D LVOT diam:     1.80 cm  Diastology LVOT Area:     2.54 cm LV e' lateral:   5.96 cm/s                         LV E/e' lateral: 37.3                         LV e' medial:    5.87 cm/s                         LV E/e' medial:  37.9  RIGHT VENTRICLE RV S prime:     11.10 cm/s TAPSE (M-mode): 1.4 cm LEFT ATRIUM              Index       RIGHT ATRIUM           Index LA Vol (A2C):   105.0 ml 58.00 ml/m RA Area:     28.20 cm LA Vol (A4C):   101.0 ml 55.80 ml/m RA Volume:   100.00 ml 55.24  ml/m LA Biplane Vol: 106.0 ml 58.56 ml/m  AORTIC VALVE AV Area (Vmax):    1.10 cm AV Area (Vmean):   1.16 cm AV Area (VTI):     1.58 cm AV Vmax:           238.33 cm/s AV Vmean:          159.667 cm/s AV VTI:            0.324 m AV Peak Grad:      22.7 mmHg AV Mean Grad:      12.3 mmHg LVOT Vmax:         102.87 cm/s LVOT Vmean:        72.833 cm/s LVOT VTI:          0.202 m LVOT/AV VTI ratio: 0.62 MITRAL VALVE                        TRICUSPID VALVE MV Area (PHT): 2.38 cm             TR Peak grad:   39.4 mmHg MV Peak grad:  17.5 mmHg            TR Vmax:        314.00 cm/s MV Mean grad:  7.0 mmHg MV Vmax:       2.09 m/s             SHUNTS MV Vmean:      114.0 cm/s           Systemic VTI:  0.20 m MV VTI:        0.56 m               Systemic Diam: 1.80 cm MV PHT:        92.61 msec MV Decel Time: 319  msec MV E velocity: 222.33 cm/s 103 cm/s  Rozann Lesches MD Electronically signed by Rozann Lesches MD Signature Date/Time: 06/25/2019/11:11:38 AM    Final     Patient Profile     84 y.o. female with a history of COPD, permanent atrial fibrillation, chronic diastolic heart failure, valvular heart disease status post bioprosthetic AVR and mitral annuloplasty, large ventral hernia, and hyperthyroidism presenting with suspected COPD exacerbation and RVR.  Assessment & Plan    1. Permanent atrial fibrillation with RVR. Heart rate better with addition of IV Lopressor to Cardizem CD 240 mg daily. She is on Eliquis for stroke prophylaxis.  2. Chronic diastolic heart failure. Continues on Demadex 60 mg daily. Output greater than intake by 1900 cc last 24 hours (also got single dose IV lasix on admission). LVEF stable at 55-60%.  3. COPD with suspected exacerbation and treatment per primary team.  4.  Valvular heart disease status post bioprosthetic AVR and mitral annuloplasty.  Valve function grossly normal by recent follow-up echocardiogram.  Trivial mitral regurgitation noted.  RVSP estimated 42 mmHg with mild RV  dysfunction.  5.  Hyperthyroidism with goiter and plan for thyroid ultrasound.  Discussed with hospitalist team.  We will change IV Lopressor to Toprol-XL 25 mg daily, otherwise continue Cardizem CD 240 mg daily along with Eliquis.  No change in outpatient Demadex dosing.  No further cardiac work-up is planned as an inpatient.  Arrange follow-up with Dr. Aundra Dubin over the next month.  [facility] will sign off.    Signed, Rozann Lesches, MD  06/26/2019, 9:03 AM

## 2019-07-03 ENCOUNTER — Ambulatory Visit (HOSPITAL_COMMUNITY): Payer: Medicare Other | Admitting: Hematology

## 2019-07-03 ENCOUNTER — Telehealth (HOSPITAL_COMMUNITY): Payer: Self-pay | Admitting: Vascular Surgery

## 2019-07-03 NOTE — Telephone Encounter (Signed)
Returned pt daughter call to get pt scheduled for [place] f/u

## 2019-07-09 ENCOUNTER — Encounter (HOSPITAL_COMMUNITY): Payer: Self-pay | Admitting: Hematology

## 2019-07-09 ENCOUNTER — Inpatient Hospital Stay (HOSPITAL_COMMUNITY): Payer: Medicare Other | Attending: Hematology | Admitting: Hematology

## 2019-07-09 ENCOUNTER — Other Ambulatory Visit: Payer: Self-pay

## 2019-07-09 DIAGNOSIS — Z87891 Personal history of nicotine dependence: Secondary | ICD-10-CM | POA: Insufficient documentation

## 2019-07-09 DIAGNOSIS — F419 Anxiety disorder, unspecified: Secondary | ICD-10-CM | POA: Diagnosis not present

## 2019-07-09 DIAGNOSIS — E785 Hyperlipidemia, unspecified: Secondary | ICD-10-CM | POA: Insufficient documentation

## 2019-07-09 DIAGNOSIS — Z7901 Long term (current) use of anticoagulants: Secondary | ICD-10-CM | POA: Diagnosis not present

## 2019-07-09 DIAGNOSIS — Z79899 Other long term (current) drug therapy: Secondary | ICD-10-CM | POA: Diagnosis not present

## 2019-07-09 DIAGNOSIS — Z90710 Acquired absence of both cervix and uterus: Secondary | ICD-10-CM | POA: Diagnosis not present

## 2019-07-09 DIAGNOSIS — Z803 Family history of malignant neoplasm of breast: Secondary | ICD-10-CM | POA: Insufficient documentation

## 2019-07-09 DIAGNOSIS — Z8249 Family history of ischemic heart disease and other diseases of the circulatory system: Secondary | ICD-10-CM | POA: Diagnosis not present

## 2019-07-09 DIAGNOSIS — Z7952 Long term (current) use of systemic steroids: Secondary | ICD-10-CM | POA: Insufficient documentation

## 2019-07-09 DIAGNOSIS — Z801 Family history of malignant neoplasm of trachea, bronchus and lung: Secondary | ICD-10-CM | POA: Insufficient documentation

## 2019-07-09 DIAGNOSIS — Z7951 Long term (current) use of inhaled steroids: Secondary | ICD-10-CM | POA: Insufficient documentation

## 2019-07-09 DIAGNOSIS — I4891 Unspecified atrial fibrillation: Secondary | ICD-10-CM | POA: Insufficient documentation

## 2019-07-09 DIAGNOSIS — J449 Chronic obstructive pulmonary disease, unspecified: Secondary | ICD-10-CM | POA: Insufficient documentation

## 2019-07-09 DIAGNOSIS — E559 Vitamin D deficiency, unspecified: Secondary | ICD-10-CM | POA: Insufficient documentation

## 2019-07-09 DIAGNOSIS — C9210 Chronic myeloid leukemia, BCR/ABL-positive, not having achieved remission: Secondary | ICD-10-CM | POA: Diagnosis not present

## 2019-07-09 MED ORDER — ERGOCALCIFEROL 1.25 MG (50000 UT) PO CAPS: 50000.0000 [IU] | ORAL_CAPSULE | ORAL | 2 refills | Status: AC

## 2019-07-09 NOTE — Assessment & Plan Note (Addendum)
1.  CML in chronic phase: -Evaluated for thrombocytosis of around 1.5 million. -We reviewed results of BCR/ABL by quantitative PCR.  It was positive for B2 A2 and B3 A2 transcript. -JAK2 V617F and reflex mutation testing is negative. -We talked extensively about her new diagnosis, treatment plan and prognosis.  I have recommended further work-up in the form of bone marrow biopsy as a baseline.  We discussed about the procedure.  She will let us know if she wants to proceed with the procedure. -She will need treatment with tyrosine kinase inhibitor.  I will review her cardiac medications and choose the TKI with the least interactions. -She was recently hospitalized from 06/24/2019-06/26/2019 with respiratory failure and A. fib with RVR.  Her breathing is still improving.  2.  Vitamin D deficiency: -Vitamin D level was 12.  I have recommended vitamin D 50,000 units weekly.  We have sent a prescription.

## 2019-07-09 NOTE — Patient Instructions (Addendum)
[facility] at [facility] Discharge Instructions  You were seen today by Dr. Delton Coombes. He went over your recent test results, they show that you have CML (chronic myeloid leukemia). This can be treated with a pill. He will schedule you for a bone marrow biopsy in [place]. He will see you back after the biopsy for follow up.   Thank you for choosing [place] at [facility] to provide your oncology and hematology care.  To afford each patient quality time with our provider, please arrive at least 15 minutes before your scheduled appointment time.   If you have a lab appointment with the [place] please come in thru the  Main Entrance and check in at the main information desk  You need to re-schedule your appointment should you arrive 10 or more minutes late.  We strive to give you quality time with our providers, and arriving late affects you and other patients whose appointments are after yours.  Also, if you no show three or more times for appointments you may be dismissed from the clinic at the providers discretion.     Again, thank you for choosing [facility].  Our hope is that these requests will decrease the amount of time that you wait before being seen by our physicians.       _____________________________________________________________  Should you have questions after your visit to [facility], please contact our office at [phone] between the hours of 8:00 a.m. and 4:30 p.m.  Voicemails left after 4:00 p.m. will not be returned until the following business day.  For prescription refill requests, have your pharmacy contact our office and allow 72 hours.    Cancer Center Support Programs:   > Cancer Support Group  2nd Tuesday of the month 1pm-2pm, Journey Room

## 2019-07-09 NOTE — Progress Notes (Signed)
[place], [place] [address]   CLINIC:  Medical Oncology/Hematology  PCP:  Patient, No Pcp Per No address on file None   REASON FOR VISIT:  Follow-up for CML  CURRENT THERAPY: Under work-up.   INTERVAL HISTORY:  Ms. Dory 84 y.o. female seen for follow-up along with her daughter.  She was recently hospitalized from 06/24/2019 for 2 days with respiratory failure and A. fib with rapid ventricular rate.  Appetite is 100%.  Energy levels are low.  Shortness of breath present.  She also has some dizziness at times.  She had an episode of diarrhea this weekend.    REVIEW OF SYSTEMS:  Review of Systems  Constitutional: Positive for fatigue.  Respiratory: Positive for shortness of breath.   Gastrointestinal: Positive for diarrhea and vomiting.  Neurological: Positive for dizziness.  All other systems reviewed and are negative.    PAST MEDICAL/SURGICAL HISTORY:  Past Medical History:  Diagnosis Date  . Anxiety   . Atrial fibrillation ([place])    with Rapid Ventricular response  . COPD (chronic obstructive pulmonary disease) ([place])   . Diastolic CHF ([place])   . Hyperlipidemia   . SOB (shortness of breath)    conplicated by underlying a fib with RVR,COPD, and CHF   Past Surgical History:  Procedure Laterality Date  . ABDOMINAL HYSTERECTOMY    . AORTIC VALVE REPLACEMENT N/A 03/05/2013   Procedure: AORTIC VALVE REPLACEMENT (AVR);  Surgeon: Gaye Pollack, MD;  Location: [place];  Service: Open Heart Surgery;  Laterality: N/A;  . BREAST BIOPSY     x3  . CARDIOVERSION N/A 02/13/2013   Procedure: CARDIOVERSION;  Surgeon: [name], MD;  Location: [facility]OSCOPY;  Service: Cardiovascular;  Laterality: N/A;  . CARDIOVERSION N/A 03/01/2013   Procedure: CARDIOVERSION;  Surgeon: [name], MD;  Location: [facility] ENDOSCOPY;  Service: Cardiovascular;  Laterality: N/A;  . CARDIOVERSION N/A 06/23/2016   Procedure: CARDIOVERSION;  Surgeon: [name], MD;  Location: [place];  Service: Cardiovascular;  Laterality: N/A;  . CATARACT EXTRACTION    . COLONOSCOPY WITH PROPOFOL N/A 06/24/2017   Procedure: COLONOSCOPY WITH PROPOFOL;  Surgeon: Carol Ada, MD;  Location: [place];  Service: Endoscopy;  Laterality: N/A;  . ESOPHAGOGASTRODUODENOSCOPY N/A 06/24/2017   Procedure: ESOPHAGOGASTRODUODENOSCOPY (EGD);  Surgeon: Carol Ada, MD;  Location: [place];  Service: Endoscopy;  Laterality: N/A;  . INTRAOPERATIVE TRANSESOPHAGEAL ECHOCARDIOGRAM N/A 03/05/2013   Procedure: INTRAOPERATIVE TRANSESOPHAGEAL ECHOCARDIOGRAM;  Surgeon: Gaye Pollack, MD;  Location: [place] OR;  Service: Open Heart Surgery;  Laterality: N/A;  . LEFT AND RIGHT HEART CATHETERIZATION WITH CORONARY ANGIOGRAM N/A 03/02/2013   Procedure: LEFT AND RIGHT HEART CATHETERIZATION WITH CORONARY ANGIOGRAM;  Surgeon: [name], MD;  Location: [facility] C[facility] LAB;  Service: Cardiovascular;  Laterality: N/A;  . MAZE N/A 03/05/2013   Procedure: MAZE;  Surgeon: Gaye Pollack, MD;  Location: [place];  Service: Open Heart Surgery;  Laterality: N/A;  . MITRAL VALVE REPAIR N/A 03/05/2013   Procedure: MITRAL VALVE REPAIR (MVR);  Surgeon: Gaye Pollack, MD;  Location: [place];  Service: Open Heart Surgery;  Laterality: N/A;  . ROTATOR CUFF REPAIR    . TEE WITHOUT CARDIOVERSION N/A 03/01/2013   Procedure: TRANSESOPHAGEAL ECHOCARDIOGRAM (TEE);  Surgeon: [name], MD;  Location: [place];  Service: Cardiovascular;  Laterality: N/A;  talked to bev. booking no. is [id number] called trish to verify time  . TONSILLECTOMY  SOCIAL HISTORY:  Social History   Socioeconomic History  . Marital status: Divorced    Spouse name: Not on file  . Number of children: 2  . Years of education: Not on file  . Highest education level: Not on file  Occupational History  . Occupation: retired  Tobacco Use  . Smoking status: Former Smoker    Packs/day: 1.00    Years: 57.00    Pack years:  57.00    Types: Cigarettes    Start date: 06/12/1955    Quit date: 11/28/2012    Years since quitting: 6.6  . Smokeless tobacco: Never Used  . Tobacco comment: Quit in July 2014  Substance and Sexual Activity  . Alcohol use: No    Alcohol/week: 0.0 standard drinks  . Drug use: No  . Sexual activity: Never  Other Topics Concern  . Not on file  Social History Narrative  . Not on file   Social Determinants of Health   Financial Resource Strain: Low Risk   . Difficulty of Paying Living Expenses: Not hard at all  Food Insecurity: No Food Insecurity  . Worried About Charity fundraiser in the Last Year: Never true  . Ran Out of Food in the Last Year: Never true  Transportation Needs: No Transportation Needs  . Lack of Transportation (Medical): No  . Lack of Transportation (Non-Medical): No  Physical Activity: Inactive  . Days of Exercise per Week: 0 days  . Minutes of Exercise per Session: 0 min  Stress: No Stress Concern Present  . Feeling of Stress : Not at all  Social Connections: Somewhat Isolated  . Frequency of Communication with Friends and Family: More than three times a week  . Frequency of Social Gatherings with Friends and Family: More than three times a week  . Attends Religious Services: 1 to 4 times per year  . Active Member of Clubs or Organizations: No  . Attends Archivist Meetings: Never  . Marital Status: Divorced  Human resources officer Violence: Not At Risk  . Fear of Current or Ex-Partner: No  . Emotionally Abused: No  . Physically Abused: No  . Sexually Abused: No    FAMILY HISTORY:  Family History  Problem Relation Age of Onset  . Breast cancer Other        family history  . Heart disease Mother   . Throat cancer Father   . Heart disease Brother   . Heart attack Son   . Breast cancer Daughter     CURRENT MEDICATIONS:  Outpatient Encounter Medications as of 07/09/2019  Medication Sig  . albuterol (VENTOLIN HFA) 108 (90 Base) MCG/ACT inhaler  Inhale 2 puffs into the lungs as needed.   Marland Kitchen atorvastatin (LIPITOR) 40 MG tablet TAKE 1 TABLET BY MOUTH EVERY DAY IN THE EVENING  . BREO ELLIPTA 100-25 MCG/INH AEPB Inhale 1 puff into the lungs daily.  Marland Kitchen diltiazem (CARDIZEM CD) 240 MG 24 hr capsule TAKE 1 CAPSULE BY MOUTH EVERY DAY  . DULoxetine (CYMBALTA) 20 MG capsule Take 1 capsule (20 mg total) by mouth daily.  Marland Kitchen ELIQUIS 5 MG TABS tablet TAKE 1 TABLET BY MOUTH TWICE A DAY  . ergocalciferol (VITAMIN D2) 1.25 MG (50000 UT) capsule Take 1 capsule (50,000 Units total) by mouth once a week.  . ferrous sulfate 325 (65 FE) MG EC tablet Take 325 mg by mouth daily with breakfast.  . KLOR-CON M20 20 MEQ tablet TAKE 2 TABLETS BY MOUTH IN THE MORNING, THEN  1 TABLET IN THE EVENING  . metoprolol succinate (TOPROL-XL) 25 MG 24 hr tablet Take 1 tablet (25 mg total) by mouth daily.  . Multiple Vitamins-Minerals (MULTIVITAMIN WITH MINERALS) tablet Take 1 tablet by mouth daily.  . naproxen sodium (ALEVE) 220 MG tablet Take 220 mg by mouth daily as needed (for pain).  . predniSONE (DELTASONE) 20 MG tablet Take 3 tablets by mouth daily x1 day; then 2 tablets by mouth daily x2 days; then 1 tablet by mouth daily x2 days; then half tablet by mouth daily x3 days and stop prednisone.  . torsemide (DEMADEX) 20 MG tablet Take 3 tablets (60 mg total) by mouth daily.   No facility-administered encounter medications on file as of 07/09/2019.    ALLERGIES:  No Known Allergies   PHYSICAL EXAM:  ECOG Performance status: 1  Vitals:   07/09/19 1541  BP: (!) 103/44  Pulse: (!) 53  Resp: (!) 36  Temp: (!) 97.1 F (36.2 C)  SpO2: 100%   Filed Weights   07/09/19 1541  Weight: 188 lb 3.2 oz (85.4 kg)    Physical Exam Vitals reviewed.  Constitutional:      Appearance: Normal appearance.  Cardiovascular:     Rate and Rhythm: Normal rate and regular rhythm.  Pulmonary:     Effort: Pulmonary effort is normal.     Breath sounds: Normal breath sounds.  Abdominal:      General: There is no distension.     Palpations: Abdomen is soft.  Skin:    General: Skin is warm.  Neurological:     General: No focal deficit present.     Mental Status: She is alert and oriented to person, place, and time.  Psychiatric:        Mood and Affect: Mood normal.        Behavior: Behavior normal.      LABORATORY DATA:  I have reviewed the labs as listed.  CBC    Component Value Date/Time   WBC 18.7 (H) 06/25/2019 0455   RBC 3.40 (L) 06/25/2019 0455   HGB 10.0 (L) 06/25/2019 0455   HCT 32.8 (L) 06/25/2019 0455   PLT 1,522 (HH) 06/25/2019 0455   MCV 96.5 06/25/2019 0455   MCH 29.4 06/25/2019 0455   MCHC 30.5 06/25/2019 0455   RDW 15.2 06/25/2019 0455   LYMPHSABS 1.5 06/11/2019 1346   MONOABS 1.1 (H) 06/11/2019 1346   EOSABS 0.1 06/11/2019 1346   BASOSABS 0.2 (H) 06/11/2019 1346   CMP Latest Ref Rng & Units 06/25/2019 06/24/2019 06/11/2019  Glucose 70 - 99 mg/dL 164(H) 115(H) 119(H)  BUN 8 - 23 mg/dL '19 11 10  ' Creatinine 0.44 - 1.00 mg/dL 0.93 0.98 0.99  Sodium 135 - 145 mmol/L 140 139 142  Potassium 3.5 - 5.1 mmol/L 4.2 4.3 3.9  Chloride 98 - 111 mmol/L 105 106 103  CO2 22 - 32 mmol/L '23 24 29  ' Calcium 8.9 - 10.3 mg/dL 8.7(L) 9.1 10.1  Total Protein 6.5 - 8.1 g/dL - - 7.3  Total Bilirubin 0.3 - 1.2 mg/dL - - 1.0  Alkaline Phos 38 - 126 U/L - - 70  AST 15 - 41 U/L - - 40  ALT 0 - 44 U/L - - 34       DIAGNOSTIC IMAGING:  I have independently reviewed the scans and discussed with the patient.     ASSESSMENT & PLAN:   CML (chronic myelocytic leukemia) ([place]) 1.  CML in chronic phase: -Evaluated for thrombocytosis of  around 1.5 million. -We reviewed results of BCR/ABL by quantitative PCR.  It was positive for B2 A2 and B3 A2 transcript. -JAK2 V617F and reflex mutation testing is negative. -We talked extensively about her new diagnosis, treatment plan and prognosis.  I have recommended further work-up in the form of bone marrow biopsy as a  baseline.  We discussed about the procedure.  She will let us know if she wants to proceed with the procedure. -She will need treatment with tyrosine kinase inhibitor.  I will review her cardiac medications and choose the TKI with the least interactions. -She was recently hospitalized from 06/24/2019-06/26/2019 with respiratory failure and A. fib with RVR.  Her breathing is still improving.  2.  Vitamin D deficiency: -Vitamin D level was 12.  I have recommended vitamin D 50,000 units weekly.  We have sent a prescription.      Orders placed this encounter:  No orders of the defined types were placed in this encounter.     Derek Jack, MD [place] [phone]

## 2019-07-10 ENCOUNTER — Other Ambulatory Visit (HOSPITAL_COMMUNITY): Payer: Self-pay | Admitting: *Deleted

## 2019-07-10 DIAGNOSIS — C9210 Chronic myeloid leukemia, BCR/ABL-positive, not having achieved remission: Secondary | ICD-10-CM

## 2019-07-16 ENCOUNTER — Encounter (HOSPITAL_COMMUNITY): Payer: Medicare Other

## 2019-07-16 ENCOUNTER — Other Ambulatory Visit: Payer: Self-pay

## 2019-07-16 ENCOUNTER — Ambulatory Visit (HOSPITAL_COMMUNITY)
Admission: RE | Admit: 2019-07-16 | Discharge: 2019-07-16 | Disposition: A | Discharge status: Home / Self Care | Payer: Medicare Other | Source: Ambulatory Visit | Attending: Cardiology | Admitting: Cardiology

## 2019-07-16 VITALS — BP 104/58 | HR 95 | Wt 183.5 lb

## 2019-07-16 DIAGNOSIS — E785 Hyperlipidemia, unspecified: Secondary | ICD-10-CM | POA: Insufficient documentation

## 2019-07-16 DIAGNOSIS — I4819 Other persistent atrial fibrillation: Secondary | ICD-10-CM | POA: Diagnosis not present

## 2019-07-16 DIAGNOSIS — Z79899 Other long term (current) drug therapy: Secondary | ICD-10-CM | POA: Insufficient documentation

## 2019-07-16 DIAGNOSIS — I350 Nonrheumatic aortic (valve) stenosis: Secondary | ICD-10-CM | POA: Diagnosis not present

## 2019-07-16 DIAGNOSIS — Z952 Presence of prosthetic heart valve: Secondary | ICD-10-CM | POA: Diagnosis not present

## 2019-07-16 DIAGNOSIS — Z7901 Long term (current) use of anticoagulants: Secondary | ICD-10-CM | POA: Insufficient documentation

## 2019-07-16 DIAGNOSIS — Z7951 Long term (current) use of inhaled steroids: Secondary | ICD-10-CM | POA: Insufficient documentation

## 2019-07-16 DIAGNOSIS — I5032 Chronic diastolic (congestive) heart failure: Secondary | ICD-10-CM | POA: Diagnosis not present

## 2019-07-16 DIAGNOSIS — D473 Essential (hemorrhagic) thrombocythemia: Secondary | ICD-10-CM | POA: Diagnosis not present

## 2019-07-16 DIAGNOSIS — D75839 Thrombocytosis, unspecified: Secondary | ICD-10-CM

## 2019-07-16 DIAGNOSIS — J449 Chronic obstructive pulmonary disease, unspecified: Secondary | ICD-10-CM | POA: Diagnosis not present

## 2019-07-16 DIAGNOSIS — E0590 Thyrotoxicosis, unspecified without thyrotoxic crisis or storm: Secondary | ICD-10-CM | POA: Insufficient documentation

## 2019-07-16 LAB — BASIC METABOLIC PANEL
Anion gap: 14 (ref 5–15)
BUN: 14 mg/dL (ref 8–23)
CO2: 28 mmol/L (ref 22–32)
Calcium: 8.8 mg/dL — ABNORMAL LOW (ref 8.9–10.3)
Chloride: 99 mmol/L (ref 98–111)
Creatinine, Ser: 1.21 mg/dL — ABNORMAL HIGH (ref 0.44–1.00)
GFR calc Af Amer: 48 mL/min — ABNORMAL LOW (ref >60)
GFR calc non Af Amer: 42 mL/min — ABNORMAL LOW (ref >60)
Glucose, Bld: 133 mg/dL — ABNORMAL HIGH (ref 70–99)
Potassium: 3.3 mmol/L — ABNORMAL LOW (ref 3.5–5.1)
Sodium: 141 mmol/L (ref 135–145)

## 2019-07-16 MED ORDER — TORSEMIDE 20 MG PO TABS: ORAL_TABLET | ORAL | 2 refills | Status: DC

## 2019-07-16 MED ORDER — METOPROLOL SUCCINATE ER 25 MG PO TB24: 25.0000 mg | ORAL_TABLET | Freq: Two times a day (BID) | ORAL | 3 refills | Status: DC

## 2019-07-16 NOTE — Progress Notes (Signed)
Patient ID: Nicole [name], female   DOB: 02-26-1936, 84 y.o.   MRN: [id number] PCP: Owens Loffler [facility]) HF Cardiology: Dr. Aundra Dubin  Nicole [name] is a 84 y.o. with history of COPD (10 pack years quit April 2014), atrial fibrillation s/p AVR with pericardial valve, mitral annuloplasty, MAZE procedure and clipping of the LA appendage (99991111) and diastolic CHF. Daughter is Personnel officer (CCU nurse).   She went back into atrial fibrillation around 5/16.  Given increased symptoms when in atrial fibrillation, I started her on amiodarone and planned for DCCV after amiodarone load. She went out of atrial fibrillation and into a stable junctional rhythm with rate in the 60s.  She also developed subclinical hyperthyroidism.  I stopped her amiodarone.  She saw an endocrinologist who recommended that she take methimazole.  However, she never started it.  No BRBPR or melena on Xarelto.   She went back into atrial fibrillation again in 1/18.  We decided to give her a trial of DCCV without starting an antiarrhythmic.  DCCV was attempted in 1/18 but failed. She remains in atrial fibrillation today.   At last appointment, pt's platelets were noted to be > 1,000K.  She was seen by Nicole [name] and diagnosed with CML.  She has not been started on treatment yet.   She was admitted to [facility] in 1/21 with COPD + CHF exacerbation.  She received a dose of IV Lasix and treatment for CHF.  Echo in 1/21 showed EF 55-60%, mildly dilated RV, stable repaired mitral valve and bioprosthetic aortic valve.   She returns today for followup of CHF.  She is very fatigued.  She is short of breath walking around her house, this is worse than in the past. She is frustrated because she cannot get out to do anything.  She is not wheezing.  She has increased her torsemide back to 80 mg daily for about 2 wks now.  No chest pain.   Labs (9/14): K 4, creatinine 0.9 => 1.04, HCT 33.7, TnI 0.21 (while hospitalized in [place]), BNP 158, TSH normal, AST  normal, ALT 39 Labs (03/16/13):  K 3.9 Cr 1.0 Labs (6/16): K 4.3, creatinine 0.99, Hgb 14.6 Labs (1/17): K 4.3, creatinine 1.08 Labs (2/17): K 3.5, creatinine 1.04, BNP 220, free T3 normal, free T4 high, TSH 0.09, LFTs normal Labs (6/17): K 3.7, creatinine 1.04, TSH/free T3/free T4 normal, LDL 54, HDL 54 Labs (1/18): K 3.9, creatinine 1.08, BNP 129, HCT 40.2 Labs (2/18): K 4.5, creatinine 0.86 Labs (5/18): TSH normal Labs (9/18): K 4, creatinine 1.0 Labs (12/18): K 3.3, creatinine 0.96 Labs (6/19): hgb 13.8, K 3.9, creatinine 1.11 Labs (2/20): LDL 54, K 4, creatinine 0.95, hgb 14.3 Labs (1/21): K 4.2, creatinine 0.93, BNP 527, WBCs 16.7, hgb 10, plts 1522  PMH: 1. COPD: PFTs (5/15) with FEV1 61%, RVC 63%, ratio 96%, DLCO 50%.   2. Atrial fibrillation: First noted in 7/14.  DCCV in 8/14 to NSR.  Recurred by 9/14.  DCCV to NSR 9/14 but recurred. S/p Maze procedure 10/14.  Recurrent atrial fibrillation around 5/16, later developed a junctional rhythm on amiodarone and amiodarone stopped.  She was noted to be back in atrial fibrillation in 1/18, failed DCCV in 1/18.  3. Hyperlipidemia 4. Diastolic CHF: Echo (123456) with EF 55-60%, grade II diastolic dysfunction, mild LVH, moderate to severe AS with mean gradient 33 mmHg/peak 49 mmHg and AVA 0.74 cm^2.  Echo (2/17) with EF 60-65%, mild focal basal septal hypertrophy, bioprosthetic aortic valve appeared  normal, s/p mitral valve repair with no significant mitral stenosis.  - Echo (12/18): EF 60-65%, moderate RV dilation with mildly decreased systolic function, peak RV-RA gradient 31 mmHg, normal bioprosthetic aortic valve, normal repaired mitral valve.  - Echo (1/21): EF 55-60%, mildly dilated RV with normal systolic function, severe biatrial enlargement, s/p mitral valve repair with trivial MR and mean gradient 7 mmHg suggesting mild mitral stenosis, bioprosthetic aortic valve with mean gradient 12 mmHg.  5. Severe AS/Moderate MR. 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 Normal coronaries cath 10/14 7. Ventral hernia.  8. Low back pain: Lumbar disc disease.  9. Hyperthyroidism: Subclinical.  Likely related to amiodarone use.  10. Junctional rhythm.  11. CML: BCR/ABL+. Has marked thrombocytosis.   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  Lives alone.  Daughter come to appointments.   FH: No premature CAD.  Breast cancer.   ROS: All systems reviewed and negative except as per HPI.   Current Outpatient Medications  Medication Sig Dispense Refill  . albuterol (VENTOLIN HFA) 108 (90 Base) MCG/ACT inhaler Inhale 2 puffs into the lungs as needed.     Marland Kitchen atorvastatin (LIPITOR) 40 MG tablet TAKE 1 TABLET BY MOUTH EVERY DAY IN THE EVENING 90 tablet 2  . BREO ELLIPTA 100-25 MCG/INH AEPB Inhale 1 puff into the lungs daily.    Marland Kitchen diltiazem (CARDIZEM CD) 240 MG 24 hr capsule TAKE 1 CAPSULE BY MOUTH EVERY DAY 90 capsule 1  . DULoxetine (CYMBALTA) 20 MG capsule Take 1 capsule (20 mg total) by mouth daily. 30 capsule 0  . ELIQUIS 5 MG TABS tablet TAKE 1 TABLET BY MOUTH TWICE A DAY 60 tablet 3  . ergocalciferol (VITAMIN D2) 1.25 MG (50000 UT) capsule Take 1 capsule (50,000 Units total) by mouth once a week. 12 capsule 2  . ferrous sulfate 325 (65 FE) MG EC tablet Take 325 mg by mouth daily with breakfast.    . KLOR-CON M20 20 MEQ tablet TAKE 2 TABLETS BY MOUTH IN THE MORNING, THEN 1 TABLET IN THE EVENING 270 tablet 1  . metoprolol succinate (TOPROL-XL) 25 MG 24 hr tablet Take 1 tablet (25 mg total) by mouth 2 (two) times daily. 180 tablet 3  . Multiple Vitamins-Minerals (MULTIVITAMIN WITH MINERALS) tablet Take 1 tablet by mouth daily.    . naproxen sodium (ALEVE) 220 MG tablet Take 220 mg by mouth daily as needed (for pain).    . torsemide (DEMADEX) 20 MG tablet Take 4 tablets by mouth every morning and 2 tablets by mouth every other afternoon 150 tablet 2   No  current facility-administered medications for this encounter.    Vitals:   07/16/19 1100  BP: (!) 104/58  Pulse: 95  SpO2: 93%  Weight: 83.2 kg (183 lb 8 oz)   Physical Exam: General: NAD Neck: JVP 10, no thyromegaly or thyroid nodule.  Lungs: Clear to auscultation bilaterally with normal respiratory effort. CV: Nondisplaced PMI.  Heart irregular S1/S2, no S3/S4, 2/6 early SEM RUSB.  No peripheral edema.  No carotid bruit.  Normal pedal pulses.  Abdomen: Soft, nontender, no hepatosplenomegaly, no distention.  Skin: Intact without lesions or rashes.  Neurologic: Alert and oriented x 3.  Psych: Normal affect. Extremities: No clubbing or cyanosis.  HEENT: Normal.   Assessment/Plan:  1. Valvular heart disease: Severe aortic stenosis and moderate MR, s/p AVR (tissue) and MV ring with Maze procedure  03/15/13.  - Stable valves on 1/21 echo.     2. Atrial fibrillation: s/p MAZE. She had been on amiodarone to keep her in NSR.  She went into a regular junctional rhythm on amiodarone.  She also developed hyperthyroidism in the setting of amiodarone use. Amiodarone was stopped. She is now in persistent atrial fibrillation and failed DCCV in 1/18.   - Continue anticoagulation with Eliquis.  Most recent data suggests that DOACs should be ok with bioprosthetic valves.   - Given prior MAZE, she is probably not a good atrial fibrillation ablation candidate.  - She will stay off amiodarone.  At this point, will continue control/anticoagulation strategy.  - Increase Toprol Xl to 25 mg bid for better rate control.  3. Chronic diastolic CHF:  NYHA class IIIb symptoms. She is worse symptomatically.  On exam, she is volume overloaded.  COPD may play a role as well.  - Increase torsemide to 80 qam/40 qpm x 3 days then after that 80 qam/40 every other afternoon. BMET today and again in 10 days.  4. COPD: stable. She no longer is smoking. Follows with pulmonary.  5. Hyperthyroidism: Check TSH today.  She is  not on methimazole.   6. CML: She needs followup with Dr. Heber Orting to decide on treatment.   Followup in 3 wks.   [name] 07/16/2019

## 2019-07-16 NOTE — Patient Instructions (Signed)
Labs done today. We will contact you only if your labs are abnormal.  INCREASE Toprol XL take 25mg  (1 tablet) by mouth TWICE a day.  INCREASE Torsemide take 80mg (4 tablets) by mouth every morning and 40mg ( 2 tablets) by mouth every afternoon for 3 days THEN take 80mg (4 tablets) by mouth every morning and 40mg (2 tablets) by mouth every other afternoon.  Your physician recommends that you schedule a follow-up appointment in: 10 days for a lab only appointment and in 3 weeks with Dr. Aundra Dubin  At the [facility], you and your health needs are our priority. As part of our continuing mission to provide you with exceptional heart care, we have created designated Provider Care Teams. These Care Teams include your primary Cardiologist (physician) and Advanced Practice Providers (APPs- Physician Assistants and Nurse Practitioners) who all work together to provide you with the care you need, when you need it.   You may see any of the following providers on your designated Care Team at your next follow up: Marland Kitchen Dr Glori Bickers . Dr [name] . Darrick Grinder, NP . Lyda Jester, PA . Audry Riles, PharmD   Please be sure to bring in all your medications bottles to every appointment.

## 2019-07-17 ENCOUNTER — Telehealth (HOSPITAL_COMMUNITY): Payer: Self-pay

## 2019-07-17 MED ORDER — POTASSIUM CHLORIDE CRYS ER 20 MEQ PO TBCR: 40.0000 meq | EXTENDED_RELEASE_TABLET | Freq: Two times a day (BID) | ORAL | 1 refills | Status: DC

## 2019-07-17 NOTE — Telephone Encounter (Signed)
-----   Message from [name], MD sent at 07/16/2019 10:02 PM EST ----- Increase daily KCl by 20 mEq with BMET 10 days

## 2019-07-17 NOTE — Telephone Encounter (Signed)
Pts daughter aware of results and will add an extra 20 meq tab to evening.  New dose will be 40 meq in the morning and 40 meq in the evening. Pt has script to do labs locally.

## 2019-07-23 DIAGNOSIS — I10 Essential (primary) hypertension: Secondary | ICD-10-CM | POA: Diagnosis not present

## 2019-07-23 DIAGNOSIS — J449 Chronic obstructive pulmonary disease, unspecified: Secondary | ICD-10-CM | POA: Diagnosis not present

## 2019-07-23 DIAGNOSIS — F419 Anxiety disorder, unspecified: Secondary | ICD-10-CM | POA: Diagnosis not present

## 2019-07-23 DIAGNOSIS — E7849 Other hyperlipidemia: Secondary | ICD-10-CM | POA: Diagnosis not present

## 2019-07-23 DIAGNOSIS — E669 Obesity, unspecified: Secondary | ICD-10-CM | POA: Diagnosis not present

## 2019-07-23 DIAGNOSIS — Z952 Presence of prosthetic heart valve: Secondary | ICD-10-CM | POA: Diagnosis not present

## 2019-07-23 DIAGNOSIS — I4891 Unspecified atrial fibrillation: Secondary | ICD-10-CM | POA: Diagnosis not present

## 2019-07-23 DIAGNOSIS — I2510 Atherosclerotic heart disease of native coronary artery without angina pectoris: Secondary | ICD-10-CM | POA: Diagnosis not present

## 2019-07-24 ENCOUNTER — Telehealth (HOSPITAL_COMMUNITY): Payer: Self-pay | Admitting: Pharmacy Technician

## 2019-07-24 ENCOUNTER — Encounter (HOSPITAL_COMMUNITY): Payer: Self-pay | Admitting: Hematology

## 2019-07-24 ENCOUNTER — Inpatient Hospital Stay (HOSPITAL_BASED_OUTPATIENT_CLINIC_OR_DEPARTMENT_OTHER): Payer: Medicare Other | Admitting: Hematology

## 2019-07-24 ENCOUNTER — Telehealth (HOSPITAL_COMMUNITY): Payer: Self-pay | Admitting: Pharmacist

## 2019-07-24 ENCOUNTER — Other Ambulatory Visit: Payer: Self-pay

## 2019-07-24 ENCOUNTER — Encounter (HOSPITAL_COMMUNITY): Payer: Self-pay

## 2019-07-24 ENCOUNTER — Inpatient Hospital Stay (HOSPITAL_COMMUNITY): Payer: Medicare Other

## 2019-07-24 VITALS — BP 95/58 | HR 69 | Temp 97.2°F | Resp 18 | Wt 185.2 lb

## 2019-07-24 DIAGNOSIS — J9601 Acute respiratory failure with hypoxia: Secondary | ICD-10-CM | POA: Diagnosis not present

## 2019-07-24 DIAGNOSIS — R0602 Shortness of breath: Secondary | ICD-10-CM | POA: Diagnosis not present

## 2019-07-24 DIAGNOSIS — I4891 Unspecified atrial fibrillation: Secondary | ICD-10-CM | POA: Diagnosis not present

## 2019-07-24 DIAGNOSIS — I5033 Acute on chronic diastolic (congestive) heart failure: Secondary | ICD-10-CM | POA: Diagnosis not present

## 2019-07-24 DIAGNOSIS — A419 Sepsis, unspecified organism: Secondary | ICD-10-CM | POA: Diagnosis not present

## 2019-07-24 DIAGNOSIS — R6520 Severe sepsis without septic shock: Secondary | ICD-10-CM | POA: Diagnosis not present

## 2019-07-24 DIAGNOSIS — C9210 Chronic myeloid leukemia, BCR/ABL-positive, not having achieved remission: Secondary | ICD-10-CM

## 2019-07-24 DIAGNOSIS — Z20822 Contact with and (suspected) exposure to covid-19: Secondary | ICD-10-CM | POA: Diagnosis not present

## 2019-07-24 DIAGNOSIS — Z66 Do not resuscitate: Secondary | ICD-10-CM | POA: Diagnosis not present

## 2019-07-24 DIAGNOSIS — J189 Pneumonia, unspecified organism: Secondary | ICD-10-CM | POA: Diagnosis not present

## 2019-07-24 LAB — CBC WITH DIFFERENTIAL/PLATELET
Abs Immature Granulocytes: 0.34 10*3/uL — ABNORMAL HIGH (ref 0.00–0.07)
Basophils Absolute: 0.2 10*3/uL — ABNORMAL HIGH (ref 0.0–0.1)
Basophils Relative: 2 %
Eosinophils Absolute: 0.1 10*3/uL (ref 0.0–0.5)
Eosinophils Relative: 1 %
HCT: 29.2 % — ABNORMAL LOW (ref 36.0–46.0)
Hemoglobin: 8.7 g/dL — ABNORMAL LOW (ref 12.0–15.0)
Immature Granulocytes: 3 %
Lymphocytes Relative: 10 %
Lymphs Abs: 1.4 10*3/uL (ref 0.7–4.0)
MCH: 27.1 pg (ref 26.0–34.0)
MCHC: 29.8 g/dL — ABNORMAL LOW (ref 30.0–36.0)
MCV: 91 fL (ref 80.0–100.0)
Monocytes Absolute: 1.4 10*3/uL — ABNORMAL HIGH (ref 0.1–1.0)
Monocytes Relative: 11 %
Neutro Abs: 10.1 10*3/uL — ABNORMAL HIGH (ref 1.7–7.7)
Neutrophils Relative %: 73 %
Platelets: 1770 10*3/uL (ref 150–400)
RBC: 3.21 MIL/uL — ABNORMAL LOW (ref 3.87–5.11)
RDW: 17 % — ABNORMAL HIGH (ref 11.5–15.5)
WBC: 13.6 10*3/uL — ABNORMAL HIGH (ref 4.0–10.5)
nRBC: 0.1 % (ref 0.0–0.2)

## 2019-07-24 LAB — COMPREHENSIVE METABOLIC PANEL
ALT: 31 U/L (ref 0–44)
AST: 27 U/L (ref 15–41)
Albumin: 3.8 g/dL (ref 3.5–5.0)
Alkaline Phosphatase: 65 U/L (ref 38–126)
Anion gap: 10 (ref 5–15)
BUN: 18 mg/dL (ref 8–23)
CO2: 28 mmol/L (ref 22–32)
Calcium: 8.9 mg/dL (ref 8.9–10.3)
Chloride: 102 mmol/L (ref 98–111)
Creatinine, Ser: 1.2 mg/dL — ABNORMAL HIGH (ref 0.44–1.00)
GFR calc Af Amer: 49 mL/min — ABNORMAL LOW (ref >60)
GFR calc non Af Amer: 42 mL/min — ABNORMAL LOW (ref >60)
Glucose, Bld: 129 mg/dL — ABNORMAL HIGH (ref 70–99)
Potassium: 4.1 mmol/L (ref 3.5–5.1)
Sodium: 140 mmol/L (ref 135–145)
Total Bilirubin: 0.9 mg/dL (ref 0.3–1.2)
Total Protein: 6.4 g/dL — ABNORMAL LOW (ref 6.5–8.1)

## 2019-07-24 LAB — LACTATE DEHYDROGENASE: LDH: 300 U/L — ABNORMAL HIGH (ref 98–192)

## 2019-07-24 LAB — MAGNESIUM: Magnesium: 2.5 mg/dL — ABNORMAL HIGH (ref 1.7–2.4)

## 2019-07-24 MED ORDER — IMATINIB MESYLATE 100 MG PO TABS: 100.0000 mg | ORAL_TABLET | Freq: Every day | ORAL | 0 refills | Status: AC

## 2019-07-24 MED ORDER — PROCHLORPERAZINE MALEATE 10 MG PO TABS: 10.0000 mg | ORAL_TABLET | Freq: Four times a day (QID) | ORAL | 3 refills | Status: AC | PRN

## 2019-07-24 NOTE — Telephone Encounter (Signed)
Oral Oncology Patient Advocate Encounter   Received notification from [facility] that prior authorization for [place] is required.  (Generic Imatinib submitted)   PA submitted on CoverMyMeds Key [id number]  Status is pending   Oral Oncology Clinic will continue to follow.  [place] Patient [place] Phone [phone] Fax [phone] 07/24/2019 3:31 PM

## 2019-07-24 NOTE — Patient Instructions (Addendum)
[place] at [facility] Discharge Instructions  You were seen today by Dr. Delton Coombes. He went over your recent lab results. He recommends starting you on Gleevec 100mg  daily. He will send it to [facility] and it will be sent to you. He will see you back in 2 weeks for labs and follow up.   Thank you for choosing [place] at [facility] to provide your oncology and hematology care.  To afford each patient quality time with our provider, please arrive at least 15 minutes before your scheduled appointment time.   If you have a lab appointment with the [facility] please come in thru the  Main Entrance and check in at the main information desk  You need to re-schedule your appointment should you arrive 10 or more minutes late.  We strive to give you quality time with our providers, and arriving late affects you and other patients whose appointments are after yours.  Also, if you no show three or more times for appointments you may be dismissed from the clinic at the providers discretion.     Again, thank you for choosing [facility].  Our hope is that these requests will decrease the amount of time that you wait before being seen by our physicians.       _____________________________________________________________  Should you have questions after your visit to [facility], please contact our office at [phone] between the hours of 8:00 a.m. and 4:30 p.m.  Voicemails left after 4:00 p.m. will not be returned until the following business day.  For prescription refill requests, have your pharmacy contact our office and allow 72 hours.    Cancer Center Support Programs:   > Cancer Support Group  2nd Tuesday of the month 1pm-2pm, Journey Room

## 2019-07-24 NOTE — Progress Notes (Signed)
Nicole [name], [place] [id number]   CLINIC:  Medical Oncology/Hematology  PCP:  Patient, No Pcp Per No address on file None   REASON FOR VISIT:  Follow-up for CML  CURRENT THERAPY: Under work-up.   INTERVAL HISTORY:  Nicole [name] 84 y.o. female seen for follow-up along with her daughter today.  Breathing is not improved.  Appetite is 75%.  Energy levels are 25%.  Shortness of breath on exertion is stable.  Denies any fevers or chills.  Denies any new onset pains.  REVIEW OF SYSTEMS:  Review of Systems  Constitutional: Positive for fatigue.  Respiratory: Positive for shortness of breath.   All other systems reviewed and are negative.    PAST MEDICAL/SURGICAL HISTORY:  Past Medical History:  Diagnosis Date  . Anxiety   . Atrial fibrillation ([place])    with Rapid Ventricular response  . COPD (chronic obstructive pulmonary disease) ([place])   . Diastolic CHF ([place])   . Hyperlipidemia   . SOB (shortness of breath)    conplicated by underlying a fib with RVR,COPD, and CHF   Past Surgical History:  Procedure Laterality Date  . ABDOMINAL HYSTERECTOMY    . AORTIC VALVE REPLACEMENT N/A 03/05/2013   Procedure: AORTIC VALVE REPLACEMENT (AVR);  Surgeon: Gaye Pollack, MD;  Location: [facility];  Service: Open Heart Surgery;  Laterality: N/A;  . BREAST BIOPSY     x3  . CARDIOVERSION N/A 02/13/2013   Procedure: CARDIOVERSION;  Surgeon: [name], MD;  Location: [facility] ENDOSCOPY;  Service: Cardiovascular;  Laterality: N/A;  . CARDIOVERSION N/A 03/01/2013   Procedure: CARDIOVERSION;  Surgeon: [name], MD;  Location: [facility] ENDOSCOPY;  Service: Cardiovascular;  Laterality: N/A;  . CARDIOVERSION N/A 06/23/2016   Procedure: CARDIOVERSION;  Surgeon: [name], MD;  Location: [place];  Service: Cardiovascular;  Laterality: N/A;  . CATARACT EXTRACTION    . COLONOSCOPY WITH PROPOFOL N/A 06/24/2017   Procedure: COLONOSCOPY WITH PROPOFOL;  Surgeon:  Carol Ada, MD;  Location: [place];  Service: Endoscopy;  Laterality: N/A;  . ESOPHAGOGASTRODUODENOSCOPY N/A 06/24/2017   Procedure: ESOPHAGOGASTRODUODENOSCOPY (EGD);  Surgeon: Carol Ada, MD;  Location: [place];  Service: Endoscopy;  Laterality: N/A;  . INTRAOPERATIVE TRANSESOPHAGEAL ECHOCARDIOGRAM N/A 03/05/2013   Procedure: INTRAOPERATIVE TRANSESOPHAGEAL ECHOCARDIOGRAM;  Surgeon: Gaye Pollack, MD;  Location: [facility];  Service: Open Heart Surgery;  Laterality: N/A;  . LEFT AND RIGHT HEART CATHETERIZATION WITH CORONARY ANGIOGRAM N/A 03/02/2013   Procedure: LEFT AND RIGHT HEART CATHETERIZATION WITH CORONARY ANGIOGRAM;  Surgeon: [name], MD;  Location: [facility] CATH LAB;  Service: Cardiovascular;  Laterality: N/A;  . MAZE N/A 03/05/2013   Procedure: MAZE;  Surgeon: Gaye Pollack, MD;  Location: [place];  Service: Open Heart Surgery;  Laterality: N/A;  . MITRAL VALVE REPAIR N/A 03/05/2013   Procedure: MITRAL VALVE REPAIR (MVR);  Surgeon: Gaye Pollack, MD;  Location: [place];  Service: Open Heart Surgery;  Laterality: N/A;  . ROTATOR CUFF REPAIR    . TEE WITHOUT CARDIOVERSION N/A 03/01/2013   Procedure: TRANSESOPHAGEAL ECHOCARDIOGRAM (TEE);  Surgeon: [name], MD;  Location: [facility];  Service: Cardiovascular;  Laterality: N/A;  talked to bev. booking no. is [phone] called trish to verify time  . TONSILLECTOMY       SOCIAL HISTORY:  Social History   Socioeconomic History  . Marital status: Divorced    Spouse name: Not on file  . Number of children: 2  .  Years of education: Not on file  . Highest education level: Not on file  Occupational History  . Occupation: retired  Tobacco Use  . Smoking status: Former Smoker    Packs/day: 1.00    Years: 57.00    Pack years: 57.00    Types: Cigarettes    Start date: 06/12/1955    Quit date: 11/28/2012    Years since quitting: 6.6  . Smokeless tobacco: Never Used  . Tobacco comment: Quit in July 2014  Substance and Sexual  Activity  . Alcohol use: No    Alcohol/week: 0.0 standard drinks  . Drug use: No  . Sexual activity: Never  Other Topics Concern  . Not on file  Social History Narrative  . Not on file   Social Determinants of Health   Financial Resource Strain: Low Risk   . Difficulty of Paying Living Expenses: Not hard at all  Food Insecurity: No Food Insecurity  . Worried About Charity fundraiser in the Last Year: Never true  . Ran Out of Food in the Last Year: Never true  Transportation Needs: No Transportation Needs  . Lack of Transportation (Medical): No  . Lack of Transportation (Non-Medical): No  Physical Activity: Inactive  . Days of Exercise per Week: 0 days  . Minutes of Exercise per Session: 0 min  Stress: No Stress Concern Present  . Feeling of Stress : Not at all  Social Connections: Somewhat Isolated  . Frequency of Communication with Friends and Family: More than three times a week  . Frequency of Social Gatherings with Friends and Family: More than three times a week  . Attends Religious Services: 1 to 4 times per year  . Active Member of Clubs or Organizations: No  . Attends Archivist Meetings: Never  . Marital Status: Divorced  Human resources officer Violence: Not At Risk  . Fear of Current or Ex-Partner: No  . Emotionally Abused: No  . Physically Abused: No  . Sexually Abused: No    FAMILY HISTORY:  Family History  Problem Relation Age of Onset  . Breast cancer Other        family history  . Heart disease Mother   . Throat cancer Father   . Heart disease Brother   . Heart attack Son   . Breast cancer Daughter     CURRENT MEDICATIONS:  Outpatient Encounter Medications as of 07/24/2019  Medication Sig  . albuterol (VENTOLIN HFA) 108 (90 Base) MCG/ACT inhaler Inhale 2 puffs into the lungs as needed.   Marland Kitchen atorvastatin (LIPITOR) 40 MG tablet TAKE 1 TABLET BY MOUTH EVERY DAY IN THE EVENING  . BREO ELLIPTA 100-25 MCG/INH AEPB Inhale 1 puff into the lungs daily.   Marland Kitchen diltiazem (CARDIZEM CD) 240 MG 24 hr capsule TAKE 1 CAPSULE BY MOUTH EVERY DAY  . DULoxetine (CYMBALTA) 20 MG capsule Take 1 capsule (20 mg total) by mouth daily.  Marland Kitchen ELIQUIS 5 MG TABS tablet TAKE 1 TABLET BY MOUTH TWICE A DAY  . ergocalciferol (VITAMIN D2) 1.25 MG (50000 UT) capsule Take 1 capsule (50,000 Units total) by mouth once a week.  . ferrous sulfate 325 (65 FE) MG EC tablet Take 325 mg by mouth daily with breakfast.  . metoprolol succinate (TOPROL-XL) 25 MG 24 hr tablet Take 1 tablet (25 mg total) by mouth 2 (two) times daily.  . Multiple Vitamins-Minerals (MULTIVITAMIN WITH MINERALS) tablet Take 1 tablet by mouth daily.  . naproxen sodium (ALEVE) 220 MG tablet Take 220  mg by mouth daily as needed (for pain).  . potassium chloride SA (KLOR-CON M20) 20 MEQ tablet Take 2 tablets (40 mEq total) by mouth 2 (two) times daily.  Marland Kitchen torsemide (DEMADEX) 20 MG tablet Take 4 tablets by mouth every morning and 2 tablets by mouth every other afternoon  . imatinib (GLEEVEC) 100 MG tablet Take 1 tablet (100 mg total) by mouth daily. Take with meals and large glass of water.Caution:Chemotherapy  . prochlorperazine (COMPAZINE) 10 MG tablet Take 1 tablet (10 mg total) by mouth every 6 (six) hours as needed for nausea or vomiting.   No facility-administered encounter medications on file as of 07/24/2019.    ALLERGIES:  No Known Allergies   PHYSICAL EXAM:  ECOG Performance status: 1  Vitals:   07/24/19 0900  BP: (!) 95/58  Pulse: 69  Resp: 18  Temp: (!) 97.2 F (36.2 C)  SpO2: 98%   Filed Weights   07/24/19 0900  Weight: 185 lb 3 oz (84 kg)    Physical Exam Vitals reviewed.  Constitutional:      Appearance: Normal appearance.  Cardiovascular:     Rate and Rhythm: Normal rate and regular rhythm.  Pulmonary:     Effort: Pulmonary effort is normal.     Breath sounds: Normal breath sounds.  Abdominal:     General: There is no distension.     Palpations: Abdomen is soft.  Skin:     General: Skin is warm.  Neurological:     General: No focal deficit present.     Mental Status: She is alert and oriented to person, place, and time.  Psychiatric:        Mood and Affect: Mood normal.        Behavior: Behavior normal.      LABORATORY DATA:  I have reviewed the labs as listed.  CBC    Component Value Date/Time   WBC 13.6 (H) 07/24/2019 1056   RBC 3.21 (L) 07/24/2019 1056   HGB 8.7 (L) 07/24/2019 1056   HCT 29.2 (L) 07/24/2019 1056   PLT 1,770 (HH) 07/24/2019 1056   MCV 91.0 07/24/2019 1056   MCH 27.1 07/24/2019 1056   MCHC 29.8 (L) 07/24/2019 1056   RDW 17.0 (H) 07/24/2019 1056   LYMPHSABS 1.4 07/24/2019 1056   MONOABS 1.4 (H) 07/24/2019 1056   EOSABS 0.1 07/24/2019 1056   BASOSABS 0.2 (H) 07/24/2019 1056   CMP Latest Ref Rng & Units 07/24/2019 07/16/2019 06/25/2019  Glucose 70 - 99 mg/dL 129(H) 133(H) 164(H)  BUN 8 - 23 mg/dL '18 14 19  ' Creatinine 0.44 - 1.00 mg/dL 1.20(H) 1.21(H) 0.93  Sodium 135 - 145 mmol/L 140 141 140  Potassium 3.5 - 5.1 mmol/L 4.1 3.3(L) 4.2  Chloride 98 - 111 mmol/L 102 99 105  CO2 22 - 32 mmol/L '28 28 23  ' Calcium 8.9 - 10.3 mg/dL 8.9 8.8(L) 8.7(L)  Total Protein 6.5 - 8.1 g/dL 6.4(L) - -  Total Bilirubin 0.3 - 1.2 mg/dL 0.9 - -  Alkaline Phos 38 - 126 U/L 65 - -  AST 15 - 41 U/L 27 - -  ALT 0 - 44 U/L 31 - -       DIAGNOSTIC IMAGING:  I have independently reviewed echo reports and discussed with patient.     ASSESSMENT & PLAN:   CML (chronic myelocytic leukemia) ([place]) 1.  CML in chronic phase: -Evaluated for thrombocytosis of around 1.5 million. -BCR/ABL by quantitative PCR was positive for B2 A2 and B3  A2 transcript. -She complains of worsening of her breathing in the last few months. -She has COPD, A. fib, AVR with pericardial valve, status post maze procedure, and diastolic heart failure. -I have reviewed her cardiac medications which include Toprol-XL, Eliquis, Cardizem and Demadex.  Most recent hospitalization  from 06/24/2019-06/26/2019 with respiratory failure and A. fib with RVR.  Breathing is still not at her baseline. -Last 2D echocardiogram on 06/25/2019 shows EF 55 to 60%.  No left ventricular hypertrophy.  Last EKG shows borderline QT interval. -She has declined bone marrow biopsy as baseline. -Hence I would like to avoid second-generation TKI's.  We will start her on [place] at a very low-dose given its potential for heart failure.  We will start her at 100 mg.  We talked about potential side effects in detail.  We will slowly titrate it up as tolerated.  We will also send a prescription to [facility] for nausea as needed. -I have sent prescription to her specialty pharmacy.  I will plan to see her back in 1 week after start of [place] and repeat her labs.  Baseline labs reviewed by me today showed white count 13.6 and platelet count 1770.  LFTs are normal.  LDH is elevated.  2.  Vitamin D deficiency: -Vitamin D level is 12.  She will continue vitamin D 50,000 units weekly.  We will plan to check it in 8 weeks.      Orders placed this encounter:  Orders Placed This Encounter  Procedures  . CBC with Differential/Platelet  . Comprehensive metabolic panel  . Magnesium  . Lactate dehydrogenase      Derek Jack, MD [place] [phone]

## 2019-07-24 NOTE — Telephone Encounter (Signed)
Oral Oncology Patient Advocate Encounter  Prior Authorization for generic Gleevec (Imatinib) has been approved.    PA# [id number] Effective dates: 06/01/19 through 05/30/20  Patients co-pay is $147.03  Oral Oncology Clinic will continue to follow.   [place] Patient [place] Phone [phone] Fax [phone] 07/24/2019 3:33 PM

## 2019-07-24 NOTE — Telephone Encounter (Signed)
Oral Oncology Pharmacist Encounter  Received new prescription for Gleevec (imatinib) for the treatment of CML, planned duration until disease progression or unacceptable toxicity.  Prescription dose and frequency assessed. Spoke with Dr. Delton Coombes and confirmed that plan is for patient to be initiated on dose reduction of imatinib 100 mg by mouth once daily, and then be reassessed at follow-up for dose escalation pending patient's tolerance.   CMP and CBC with Diff from 07/24/19 assessed:  Scr 1.20 (CrCl ~47.9 mL/min) - no dose adjustments necessary at this time.   Noted patients with thrombocytosis (1770K/uL) and slight leukocytosis (13.5K/uL) - no dose adjustments necessary  Current medication list in Epic reviewed, DDIs with Gleevec (imatinib) identified:  Category C DDI between imatinib and atorvastatin, Breo Ellipta, and Eliquis - imatinib may increase serum concentrations of these medications, increasing risk for side effects. Will counsel patient to be aware of possible risk.   Category C DDI between imatinib and diltiazem - diltiazem may increase the serum concentration of imatinib. Patient starting on reduced dose of imatinib, but will reinforce side effects of imatinib with patient.  No dose adjustments are necessary at this time.  Prescription has been e-scribed to the [facility] for benefits analysis and approval.  Oral Oncology Clinic will continue to follow for insurance authorization, copayment issues, initial counseling and start date.  Leron Croak, PharmD, [place] PGY2 Hematology/Oncology Pharmacy Resident 07/24/2019 1:56 PM Oral Oncology Clinic [phone]

## 2019-07-24 NOTE — Assessment & Plan Note (Addendum)
1.  CML in chronic phase: -Evaluated for thrombocytosis of around 1.5 million. -BCR/ABL by quantitative PCR was positive for B2 A2 and B3 A2 transcript. -She complains of worsening of her breathing in the last few months. -She has COPD, A. fib, AVR with pericardial valve, status post maze procedure, and diastolic heart failure. -I have reviewed her cardiac medications which include Toprol-XL, Eliquis, Cardizem and Demadex.  Most recent hospitalization from 06/24/2019-06/26/2019 with respiratory failure and A. fib with RVR.  Breathing is still not at her baseline. -Last 2D echocardiogram on 06/25/2019 shows EF 55 to 60%.  No left ventricular hypertrophy.  Last EKG shows borderline QT interval. -She has declined bone marrow biopsy as baseline. -Hence I would like to avoid second-generation TKI's.  We will start her on Gleevec at a very low-dose given its potential for heart failure.  We will start her at 100 mg.  We talked about potential side effects in detail.  We will slowly titrate it up as tolerated.  We will also send a prescription to [facility] for nausea as needed. -I have sent prescription to her specialty pharmacy.  I will plan to see her back in 1 week after start of Gleevec and repeat her labs.  Baseline labs reviewed by me today showed white count 13.6 and platelet count 1770.  LFTs are normal.  LDH is elevated.  2.  Vitamin D deficiency: -Vitamin D level is 12.  She will continue vitamin D 50,000 units weekly.  We will plan to check it in 8 weeks. 

## 2019-07-24 NOTE — Progress Notes (Signed)
CRITICAL VALUE ALERT  Critical Value:  Platelets 1770  Date & Time Notied:  07/24/2019 at 1143  Provider Notified: Dr. Delton Coombes  Orders Received/Actions taken: n/a - Rx for [place] sent today by MD to [facility]

## 2019-07-26 NOTE — Telephone Encounter (Signed)
Oral Oncology Patient Advocate Encounter  Spoke to patient's daughter, Nicole [name], regarding benefits investigation and copay for Gleevec (Imatinib).  Currently patient is not able to afford $147.03 copay due to high out of pocket costs for other medications.  There is no open funding through foundations and [employer] is no longer providing patient assistance for [place] as of 2021.  Nicole [name] said that the patient currently pays $600 a month for other medications she is on.  She was able to have one of the inhalers changed to one with a lower copay.  I told her to also reach out the the physicians office that prescribes the Eliquis and ask for help with patient assistance to lower out of pocket cost.    I will call Nicole [name] back pending medication and assistance.  [place] Patient [place] Phone [phone] Fax [phone] 07/26/2019 12:19 PM

## 2019-07-27 ENCOUNTER — Other Ambulatory Visit: Payer: Self-pay

## 2019-07-27 ENCOUNTER — Encounter (HOSPITAL_COMMUNITY): Payer: Self-pay | Admitting: *Deleted

## 2019-07-27 ENCOUNTER — Inpatient Hospital Stay (HOSPITAL_COMMUNITY)
Admission: EM | Admit: 2019-07-27 | Discharge: 2019-08-09 | DRG: 871 | Disposition: A | Discharge status: Skilled Nursing Facility | Payer: Medicare Other | Attending: Internal Medicine | Admitting: Internal Medicine

## 2019-07-27 ENCOUNTER — Emergency Department (HOSPITAL_COMMUNITY): Payer: Medicare Other

## 2019-07-27 DIAGNOSIS — M40209 Unspecified kyphosis, site unspecified: Secondary | ICD-10-CM | POA: Diagnosis present

## 2019-07-27 DIAGNOSIS — A419 Sepsis, unspecified organism: Secondary | ICD-10-CM | POA: Diagnosis not present

## 2019-07-27 DIAGNOSIS — I5032 Chronic diastolic (congestive) heart failure: Secondary | ICD-10-CM | POA: Diagnosis not present

## 2019-07-27 DIAGNOSIS — I4821 Permanent atrial fibrillation: Secondary | ICD-10-CM | POA: Diagnosis not present

## 2019-07-27 DIAGNOSIS — I350 Nonrheumatic aortic (valve) stenosis: Secondary | ICD-10-CM | POA: Diagnosis not present

## 2019-07-27 DIAGNOSIS — R0600 Dyspnea, unspecified: Secondary | ICD-10-CM

## 2019-07-27 DIAGNOSIS — Z66 Do not resuscitate: Secondary | ICD-10-CM | POA: Diagnosis not present

## 2019-07-27 DIAGNOSIS — J9601 Acute respiratory failure with hypoxia: Secondary | ICD-10-CM | POA: Diagnosis present

## 2019-07-27 DIAGNOSIS — Z20822 Contact with and (suspected) exposure to covid-19: Secondary | ICD-10-CM | POA: Diagnosis present

## 2019-07-27 DIAGNOSIS — T462X5A Adverse effect of other antidysrhythmic drugs, initial encounter: Secondary | ICD-10-CM | POA: Diagnosis present

## 2019-07-27 DIAGNOSIS — I5033 Acute on chronic diastolic (congestive) heart failure: Secondary | ICD-10-CM | POA: Diagnosis present

## 2019-07-27 DIAGNOSIS — Z953 Presence of xenogenic heart valve: Secondary | ICD-10-CM

## 2019-07-27 DIAGNOSIS — Y95 Nosocomial condition: Secondary | ICD-10-CM | POA: Diagnosis present

## 2019-07-27 DIAGNOSIS — J189 Pneumonia, unspecified organism: Secondary | ICD-10-CM | POA: Diagnosis not present

## 2019-07-27 DIAGNOSIS — C9210 Chronic myeloid leukemia, BCR/ABL-positive, not having achieved remission: Secondary | ICD-10-CM | POA: Diagnosis present

## 2019-07-27 DIAGNOSIS — I509 Heart failure, unspecified: Secondary | ICD-10-CM

## 2019-07-27 DIAGNOSIS — E0580 Other thyrotoxicosis without thyrotoxic crisis or storm: Secondary | ICD-10-CM | POA: Diagnosis present

## 2019-07-27 DIAGNOSIS — T380X5A Adverse effect of glucocorticoids and synthetic analogues, initial encounter: Secondary | ICD-10-CM | POA: Diagnosis not present

## 2019-07-27 DIAGNOSIS — E785 Hyperlipidemia, unspecified: Secondary | ICD-10-CM | POA: Diagnosis present

## 2019-07-27 DIAGNOSIS — I2510 Atherosclerotic heart disease of native coronary artery without angina pectoris: Secondary | ICD-10-CM | POA: Diagnosis present

## 2019-07-27 DIAGNOSIS — R0602 Shortness of breath: Secondary | ICD-10-CM | POA: Diagnosis not present

## 2019-07-27 DIAGNOSIS — R0902 Hypoxemia: Secondary | ICD-10-CM | POA: Diagnosis not present

## 2019-07-27 DIAGNOSIS — J441 Chronic obstructive pulmonary disease with (acute) exacerbation: Secondary | ICD-10-CM | POA: Diagnosis not present

## 2019-07-27 DIAGNOSIS — Z7401 Bed confinement status: Secondary | ICD-10-CM | POA: Diagnosis not present

## 2019-07-27 DIAGNOSIS — F339 Major depressive disorder, recurrent, unspecified: Secondary | ICD-10-CM | POA: Diagnosis not present

## 2019-07-27 DIAGNOSIS — J9811 Atelectasis: Secondary | ICD-10-CM | POA: Diagnosis not present

## 2019-07-27 DIAGNOSIS — N179 Acute kidney failure, unspecified: Secondary | ICD-10-CM | POA: Diagnosis not present

## 2019-07-27 DIAGNOSIS — Z8249 Family history of ischemic heart disease and other diseases of the circulatory system: Secondary | ICD-10-CM

## 2019-07-27 DIAGNOSIS — K439 Ventral hernia without obstruction or gangrene: Secondary | ICD-10-CM | POA: Diagnosis present

## 2019-07-27 DIAGNOSIS — D630 Anemia in neoplastic disease: Secondary | ICD-10-CM | POA: Diagnosis present

## 2019-07-27 DIAGNOSIS — Z7901 Long term (current) use of anticoagulants: Secondary | ICD-10-CM

## 2019-07-27 DIAGNOSIS — J440 Chronic obstructive pulmonary disease with acute lower respiratory infection: Secondary | ICD-10-CM | POA: Diagnosis not present

## 2019-07-27 DIAGNOSIS — J984 Other disorders of lung: Secondary | ICD-10-CM | POA: Diagnosis present

## 2019-07-27 DIAGNOSIS — Z79899 Other long term (current) drug therapy: Secondary | ICD-10-CM

## 2019-07-27 DIAGNOSIS — Z954 Presence of other heart-valve replacement: Secondary | ICD-10-CM | POA: Diagnosis not present

## 2019-07-27 DIAGNOSIS — E669 Obesity, unspecified: Secondary | ICD-10-CM | POA: Diagnosis present

## 2019-07-27 DIAGNOSIS — F411 Generalized anxiety disorder: Secondary | ICD-10-CM | POA: Diagnosis not present

## 2019-07-27 DIAGNOSIS — D75839 Thrombocytosis, unspecified: Secondary | ICD-10-CM | POA: Diagnosis present

## 2019-07-27 DIAGNOSIS — I4891 Unspecified atrial fibrillation: Secondary | ICD-10-CM | POA: Diagnosis not present

## 2019-07-27 DIAGNOSIS — Z7951 Long term (current) use of inhaled steroids: Secondary | ICD-10-CM

## 2019-07-27 DIAGNOSIS — D473 Essential (hemorrhagic) thrombocythemia: Secondary | ICD-10-CM | POA: Diagnosis not present

## 2019-07-27 DIAGNOSIS — N1831 Chronic kidney disease, stage 3a: Secondary | ICD-10-CM | POA: Diagnosis present

## 2019-07-27 DIAGNOSIS — Z6833 Body mass index (BMI) 33.0-33.9, adult: Secondary | ICD-10-CM

## 2019-07-27 DIAGNOSIS — E049 Nontoxic goiter, unspecified: Secondary | ICD-10-CM | POA: Diagnosis present

## 2019-07-27 DIAGNOSIS — E559 Vitamin D deficiency, unspecified: Secondary | ICD-10-CM | POA: Diagnosis not present

## 2019-07-27 DIAGNOSIS — I5023 Acute on chronic systolic (congestive) heart failure: Secondary | ICD-10-CM | POA: Diagnosis not present

## 2019-07-27 DIAGNOSIS — J90 Pleural effusion, not elsewhere classified: Secondary | ICD-10-CM | POA: Diagnosis not present

## 2019-07-27 DIAGNOSIS — J449 Chronic obstructive pulmonary disease, unspecified: Secondary | ICD-10-CM | POA: Diagnosis not present

## 2019-07-27 DIAGNOSIS — I480 Paroxysmal atrial fibrillation: Secondary | ICD-10-CM | POA: Diagnosis not present

## 2019-07-27 DIAGNOSIS — R6520 Severe sepsis without septic shock: Secondary | ICD-10-CM | POA: Diagnosis not present

## 2019-07-27 DIAGNOSIS — F419 Anxiety disorder, unspecified: Secondary | ICD-10-CM | POA: Diagnosis present

## 2019-07-27 DIAGNOSIS — Z90710 Acquired absence of both cervix and uterus: Secondary | ICD-10-CM

## 2019-07-27 DIAGNOSIS — Z87891 Personal history of nicotine dependence: Secondary | ICD-10-CM

## 2019-07-27 DIAGNOSIS — I959 Hypotension, unspecified: Secondary | ICD-10-CM | POA: Diagnosis not present

## 2019-07-27 HISTORY — DX: Endocarditis, valve unspecified: I38

## 2019-07-27 LAB — COMPREHENSIVE METABOLIC PANEL
ALT: 27 U/L (ref 0–44)
AST: 28 U/L (ref 15–41)
Albumin: 3.8 g/dL (ref 3.5–5.0)
Alkaline Phosphatase: 65 U/L (ref 38–126)
Anion gap: 11 (ref 5–15)
BUN: 21 mg/dL (ref 8–23)
CO2: 27 mmol/L (ref 22–32)
Calcium: 9 mg/dL (ref 8.9–10.3)
Chloride: 100 mmol/L (ref 98–111)
Creatinine, Ser: 1.2 mg/dL — ABNORMAL HIGH (ref 0.44–1.00)
GFR calc Af Amer: 49 mL/min — ABNORMAL LOW (ref >60)
GFR calc non Af Amer: 42 mL/min — ABNORMAL LOW (ref >60)
Glucose, Bld: 146 mg/dL — ABNORMAL HIGH (ref 70–99)
Potassium: 3.8 mmol/L (ref 3.5–5.1)
Sodium: 138 mmol/L (ref 135–145)
Total Bilirubin: 1 mg/dL (ref 0.3–1.2)
Total Protein: 6.6 g/dL (ref 6.5–8.1)

## 2019-07-27 LAB — CBC WITH DIFFERENTIAL/PLATELET
Abs Immature Granulocytes: 0.38 10*3/uL — ABNORMAL HIGH (ref 0.00–0.07)
Basophils Absolute: 0.4 10*3/uL — ABNORMAL HIGH (ref 0.0–0.1)
Basophils Relative: 2 %
Eosinophils Absolute: 0.1 10*3/uL (ref 0.0–0.5)
Eosinophils Relative: 0 %
HCT: 30.7 % — ABNORMAL LOW (ref 36.0–46.0)
Hemoglobin: 9 g/dL — ABNORMAL LOW (ref 12.0–15.0)
Immature Granulocytes: 2 %
Lymphocytes Relative: 10 %
Lymphs Abs: 1.8 10*3/uL (ref 0.7–4.0)
MCH: 26.4 pg (ref 26.0–34.0)
MCHC: 29.3 g/dL — ABNORMAL LOW (ref 30.0–36.0)
MCV: 90 fL (ref 80.0–100.0)
Monocytes Absolute: 2 10*3/uL — ABNORMAL HIGH (ref 0.1–1.0)
Monocytes Relative: 11 %
Neutro Abs: 13.6 10*3/uL — ABNORMAL HIGH (ref 1.7–7.7)
Neutrophils Relative %: 75 %
Platelets: 2257 10*3/uL (ref 150–400)
RBC: 3.41 MIL/uL — ABNORMAL LOW (ref 3.87–5.11)
RDW: 17.5 % — ABNORMAL HIGH (ref 11.5–15.5)
WBC: 18.3 10*3/uL — ABNORMAL HIGH (ref 4.0–10.5)
nRBC: 0.3 % — ABNORMAL HIGH (ref 0.0–0.2)

## 2019-07-27 LAB — URINALYSIS, ROUTINE W REFLEX MICROSCOPIC
Bilirubin Urine: NEGATIVE
Glucose, UA: NEGATIVE mg/dL
Hgb urine dipstick: NEGATIVE
Ketones, ur: NEGATIVE mg/dL
Leukocytes,Ua: NEGATIVE
Nitrite: NEGATIVE
Protein, ur: NEGATIVE mg/dL
Specific Gravity, Urine: 1.008 (ref 1.005–1.030)
pH: 7 (ref 5.0–8.0)

## 2019-07-27 LAB — PROTIME-INR
INR: 1.9 — ABNORMAL HIGH (ref 0.8–1.2)
Prothrombin Time: 21.6 seconds — ABNORMAL HIGH (ref 11.4–15.2)

## 2019-07-27 LAB — RESPIRATORY PANEL BY RT PCR (FLU A&B, COVID)
Influenza A by PCR: NEGATIVE
Influenza B by PCR: NEGATIVE
SARS Coronavirus 2 by RT PCR: NEGATIVE

## 2019-07-27 LAB — PROCALCITONIN: Procalcitonin: <0.1 ng/mL

## 2019-07-27 LAB — LACTIC ACID, PLASMA
Lactic Acid, Venous: 2.2 mmol/L (ref 0.5–1.9)
Lactic Acid, Venous: 1.3 mmol/L (ref 0.5–1.9)

## 2019-07-27 LAB — TROPONIN I (HIGH SENSITIVITY)
Troponin I (High Sensitivity): 17 ng/L (ref <18)
Troponin I (High Sensitivity): 18 ng/L — ABNORMAL HIGH (ref <18)

## 2019-07-27 LAB — BRAIN NATRIURETIC PEPTIDE: B Natriuretic Peptide: 590 pg/mL — ABNORMAL HIGH (ref 0.0–100.0)

## 2019-07-27 LAB — APTT: aPTT: 40 seconds — ABNORMAL HIGH (ref 24–36)

## 2019-07-27 MED ORDER — SODIUM CHLORIDE 0.9 % IV BOLUS: 1000.0000 mL | Freq: Once | INTRAVENOUS | Status: DC

## 2019-07-27 MED ORDER — ATORVASTATIN CALCIUM 40 MG PO TABS
40.0000 mg | ORAL_TABLET | Freq: Every day | ORAL | Status: DC
  Administered 2019-07-28 – 2019-08-08 (×12): 40 mg via ORAL
  Filled 2019-07-27 (×14): qty 1

## 2019-07-27 MED ORDER — AEROCHAMBER PLUS FLO-VU MEDIUM MISC
1.0000 | Freq: Once | Status: AC
  Administered 2019-07-27: 1
  Filled 2019-07-27: qty 1

## 2019-07-27 MED ORDER — PROCHLORPERAZINE MALEATE 5 MG PO TABS: 10.0000 mg | ORAL_TABLET | Freq: Four times a day (QID) | ORAL | Status: DC | PRN

## 2019-07-27 MED ORDER — POTASSIUM CHLORIDE CRYS ER 20 MEQ PO TBCR
40.0000 meq | EXTENDED_RELEASE_TABLET | Freq: Two times a day (BID) | ORAL | Status: DC
  Administered 2019-07-27 – 2019-07-28 (×2): 40 meq via ORAL
  Filled 2019-07-27 (×2): qty 2

## 2019-07-27 MED ORDER — VANCOMYCIN HCL 1500 MG/300ML IV SOLN
1500.0000 mg | Freq: Once | INTRAVENOUS | Status: AC
  Administered 2019-07-27: 18:00:00 1500 mg via INTRAVENOUS
  Filled 2019-07-27: qty 300

## 2019-07-27 MED ORDER — VANCOMYCIN HCL 500 MG/100ML IV SOLN
500.0000 mg | INTRAVENOUS | Status: DC
  Administered 2019-07-28: 500 mg via INTRAVENOUS
  Filled 2019-07-27 (×3): qty 100

## 2019-07-27 MED ORDER — SODIUM CHLORIDE 0.9 % IV SOLN
2.0000 g | Freq: Once | INTRAVENOUS | Status: DC
  Filled 2019-07-27: qty 2

## 2019-07-27 MED ORDER — METOPROLOL SUCCINATE ER 25 MG PO TB24
25.0000 mg | ORAL_TABLET | Freq: Two times a day (BID) | ORAL | Status: DC
  Administered 2019-07-27 – 2019-07-29 (×3): 25 mg via ORAL
  Filled 2019-07-27 (×7): qty 1

## 2019-07-27 MED ORDER — BUSPIRONE HCL 5 MG PO TABS
10.0000 mg | ORAL_TABLET | Freq: Two times a day (BID) | ORAL | Status: DC
  Administered 2019-07-27 – 2019-08-09 (×26): 10 mg via ORAL
  Filled 2019-07-27 (×26): qty 2

## 2019-07-27 MED ORDER — METHYLPREDNISOLONE SODIUM SUCC 125 MG IJ SOLR
60.0000 mg | Freq: Four times a day (QID) | INTRAMUSCULAR | Status: AC
  Administered 2019-07-27 – 2019-07-28 (×4): 60 mg via INTRAVENOUS
  Filled 2019-07-27 (×4): qty 2

## 2019-07-27 MED ORDER — SODIUM CHLORIDE 0.9 % IV SOLN
2.0000 g | Freq: Two times a day (BID) | INTRAVENOUS | Status: DC
  Administered 2019-07-27 – 2019-07-30 (×6): 2 g via INTRAVENOUS
  Filled 2019-07-27 (×5): qty 2

## 2019-07-27 MED ORDER — PREDNISONE 20 MG PO TABS
40.0000 mg | ORAL_TABLET | Freq: Every day | ORAL | Status: DC
  Administered 2019-07-29: 40 mg via ORAL
  Filled 2019-07-27: qty 2

## 2019-07-27 MED ORDER — FLUTICASONE FUROATE-VILANTEROL 100-25 MCG/INH IN AEPB
1.0000 | INHALATION_SPRAY | Freq: Every day | RESPIRATORY_TRACT | Status: DC
  Administered 2019-07-29 – 2019-08-03 (×6): 1 via RESPIRATORY_TRACT

## 2019-07-27 MED ORDER — APIXABAN 5 MG PO TABS
5.0000 mg | ORAL_TABLET | Freq: Two times a day (BID) | ORAL | Status: DC
  Administered 2019-07-27 – 2019-07-29 (×5): 5 mg via ORAL
  Filled 2019-07-27 (×6): qty 1

## 2019-07-27 MED ORDER — DILTIAZEM HCL ER COATED BEADS 120 MG PO CP24
240.0000 mg | ORAL_CAPSULE | Freq: Every day | ORAL | Status: DC
  Administered 2019-07-28 – 2019-07-29 (×2): 240 mg via ORAL
  Filled 2019-07-27 (×4): qty 2

## 2019-07-27 MED ORDER — METHYLPREDNISOLONE SODIUM SUCC 125 MG IJ SOLR
125.0000 mg | Freq: Once | INTRAMUSCULAR | Status: AC
  Administered 2019-07-27: 17:00:00 125 mg via INTRAVENOUS
  Filled 2019-07-27: qty 2

## 2019-07-27 MED ORDER — SODIUM CHLORIDE 0.9 % IV SOLN
1000.0000 mL | INTRAVENOUS | Status: DC
  Administered 2019-07-27 – 2019-07-28 (×2): 1000 mL via INTRAVENOUS

## 2019-07-27 MED ORDER — IPRATROPIUM-ALBUTEROL 0.5-2.5 (3) MG/3ML IN SOLN
3.0000 mL | Freq: Four times a day (QID) | RESPIRATORY_TRACT | Status: DC
  Administered 2019-07-27 – 2019-07-29 (×7): 3 mL via RESPIRATORY_TRACT
  Filled 2019-07-27 (×6): qty 3

## 2019-07-27 MED ORDER — ALBUTEROL SULFATE (2.5 MG/3ML) 0.083% IN NEBU
2.5000 mg | INHALATION_SOLUTION | RESPIRATORY_TRACT | Status: DC | PRN
  Administered 2019-08-03: 2.5 mg via RESPIRATORY_TRACT
  Filled 2019-07-27 (×2): qty 3

## 2019-07-27 MED ORDER — DULOXETINE HCL 20 MG PO CPEP
20.0000 mg | ORAL_CAPSULE | Freq: Every day | ORAL | Status: DC
  Administered 2019-07-28 – 2019-08-09 (×13): 20 mg via ORAL
  Filled 2019-07-27 (×13): qty 1

## 2019-07-27 MED ORDER — ALBUTEROL SULFATE HFA 108 (90 BASE) MCG/ACT IN AERS
2.0000 | INHALATION_SPRAY | Freq: Once | RESPIRATORY_TRACT | Status: AC
  Administered 2019-07-27: 2 via RESPIRATORY_TRACT
  Filled 2019-07-27: qty 6.7

## 2019-07-27 MED ORDER — VANCOMYCIN HCL IN DEXTROSE 1-5 GM/200ML-% IV SOLN
1000.0000 mg | Freq: Once | INTRAVENOUS | Status: DC
  Filled 2019-07-27: qty 200

## 2019-07-27 MED ORDER — FERROUS SULFATE 325 (65 FE) MG PO TABS
325.0000 mg | ORAL_TABLET | Freq: Every day | ORAL | Status: DC
  Administered 2019-07-28 – 2019-08-09 (×13): 325 mg via ORAL
  Filled 2019-07-27 (×13): qty 1

## 2019-07-27 NOTE — ED Notes (Signed)
Date and time results received: 07/27/19 1750 (use smartphrase ".now" to insert current time)  Test: lactic Critical Value: 2.2  Name of Provider Notified: Dr. Nehemiah Settle  Orders Received? Or Actions Taken?: n/a

## 2019-07-27 NOTE — ED Provider Notes (Signed)
[facility] EMERGENCY DEPARTMENT Provider Note   CSN: [id number] Arrival date & time: 07/27/19  1458     History Chief Complaint  Patient presents with  . Shortness of Breath    Nicole [name] is a 84 y.o. female.  HPI   This patient is a very pleasant 84 year old female who unfortunately has a history of congestive heart failure, COPD, she has a history of atrial fibrillation and is currently anticoagulated on Eliquis as well as a history of multiple heart valves which have either been replaced or repaired.  Including her aortic valve and her mitral valve which was repaired.  She presents with increasing shortness of breath which started 1 week ago has gradually worsened and has now become severe.  She has been seen in the last couple of weeks by her heart doctor back on 16 February who made some recommendations including taking Toprol-XL 25 mg twice a day as well as taking torsemide 80 mg by mouth every morning and 40 mg every afternoon for 3 days and then reducing it to 80 mg in the morning followed by 40 mg every other afternoon.  She has not yet had a follow-up from that visit and states she has been compliant with her medications.  The patient also had a visit with the oncologist whom she sees due to a history of leukemia, she has chronic myelocytic leukemia  She had been prescribed doxycycline and prednisone, she had recently been hospitalized from January 24 through the 26th with respiratory failure and rapid ventricular response of A. fib.  She is not back to baseline with her breathing.  Thus these medications were prescribed.  I have formally reviewed the medical record and found that her last ejection fraction was 55 to 60% on June 25, 2019.  The patient reports that she has not yet been able to get her medications including the doxycycline and the prednisone.  She is taking her COPD treatments at home but this has not been improving her shortness of breath which is gradually  worsening and is now become severe.  She does not worsen occasional cough but, she denies any swelling of her legs, she is only able to speak in short very short sentences secondary to distress thus a level 5 caveat applies.  Past Medical History:  Diagnosis Date  . Anxiety   . Atrial fibrillation ([place])    with Rapid Ventricular response  . COPD (chronic obstructive pulmonary disease) ([place])   . Diastolic CHF ([place])   . Hyperlipidemia   . SOB (shortness of breath)    conplicated by underlying a fib with RVR,COPD, and CHF    Patient Active Problem List   Diagnosis Date Noted  . CML (chronic myelocytic leukemia) ([place]) 07/09/2019  . Acute on chronic congestive heart failure ([place])   . COPD exacerbation ([place])   . Thyroid enlarged   . Class 2 obesity due to excess calories with body mass index (BMI) of 36.0 to 36.9 in adult   . SOB (shortness of breath) 06/25/2019  . Dyspnea 06/24/2019  . Thrombocytosis ([place]) 06/11/2019  . Chronic back pain 06/24/2017  . Symptomatic anemia 06/23/2017  . Hyperthyroidism 08/05/2015  . Chronic diastolic CHF (congestive heart failure) ([place]) 02/28/2013  . Aortic stenosis 02/28/2013  . CAD (coronary artery disease) 02/11/2013  . Acute on chronic diastolic HF (heart failure) ([place]) 02/11/2013  . Atrial fibrillation ([place]) 02/11/2013    Past Surgical History:  Procedure Laterality Date  . ABDOMINAL HYSTERECTOMY    .  AORTIC VALVE REPLACEMENT N/A 03/05/2013   Procedure: AORTIC VALVE REPLACEMENT (AVR);  Surgeon: Gaye Pollack, MD;  Location: [place];  Service: Open Heart Surgery;  Laterality: N/A;  . BREAST BIOPSY     x3  . CARDIOVERSION N/A 02/13/2013   Procedure: CARDIOVERSION;  Surgeon: [name], MD;  Location: [facility] ENDOSCOPY;  Service: Cardiovascular;  Laterality: N/A;  . CARDIOVERSION N/A 03/01/2013   Procedure: CARDIOVERSION;  Surgeon: [name], MD;  Location: [facility] ENDOSCOPY;  Service: Cardiovascular;  Laterality: N/A;  . CARDIOVERSION N/A  06/23/2016   Procedure: CARDIOVERSION;  Surgeon: [name], MD;  Location: [place];  Service: Cardiovascular;  Laterality: N/A;  . CATARACT EXTRACTION    . COLONOSCOPY WITH PROPOFOL N/A 06/24/2017   Procedure: COLONOSCOPY WITH PROPOFOL;  Surgeon: Carol Ada, MD;  Location: [place];  Service: Endoscopy;  Laterality: N/A;  . ESOPHAGOGASTRODUODENOSCOPY N/A 06/24/2017   Procedure: ESOPHAGOGASTRODUODENOSCOPY (EGD);  Surgeon: Carol Ada, MD;  Location: [place];  Service: Endoscopy;  Laterality: N/A;  . INTRAOPERATIVE TRANSESOPHAGEAL ECHOCARDIOGRAM N/A 03/05/2013   Procedure: INTRAOPERATIVE TRANSESOPHAGEAL ECHOCARDIOGRAM;  Surgeon: Gaye Pollack, MD;  Location: [place] [facility];  Service: Open Heart Surgery;  Laterality: N/A;  . LEFT AND RIGHT HEART CATHETERIZATION WITH CORONARY ANGIOGRAM N/A 03/02/2013   Procedure: LEFT AND RIGHT HEART CATHETERIZATION WITH CORONARY ANGIOGRAM;  Surgeon: [name], MD;  Location: [facility] CATH LAB;  Service: Cardiovascular;  Laterality: N/A;  . MAZE N/A 03/05/2013   Procedure: MAZE;  Surgeon: Gaye Pollack, MD;  Location: [place];  Service: Open Heart Surgery;  Laterality: N/A;  . MITRAL VALVE REPAIR N/A 03/05/2013   Procedure: MITRAL VALVE REPAIR (MVR);  Surgeon: Gaye Pollack, MD;  Location: [place];  Service: Open Heart Surgery;  Laterality: N/A;  . ROTATOR CUFF REPAIR    . TEE WITHOUT CARDIOVERSION N/A 03/01/2013   Procedure: TRANSESOPHAGEAL ECHOCARDIOGRAM (TEE);  Surgeon: [name], MD;  Location: [place];  Service: Cardiovascular;  Laterality: N/A;  talked to bev. booking no. is [id number] called trish to verify time  . TONSILLECTOMY       OB History   No obstetric history on file.     Family History  Problem Relation Age of Onset  . Breast cancer Other        family history  . Heart disease Mother   . Throat cancer Father   . Heart disease Brother   . Heart attack Son   . Breast cancer Daughter     Social History   Tobacco Use  .  Smoking status: Former Smoker    Packs/day: 1.00    Years: 57.00    Pack years: 57.00    Types: Cigarettes    Start date: 06/12/1955    Quit date: 11/28/2012    Years since quitting: 6.6  . Smokeless tobacco: Never Used  . Tobacco comment: Quit in July 2014  Substance Use Topics  . Alcohol use: No    Alcohol/week: 0.0 standard drinks  . Drug use: No    Home Medications Prior to Admission medications   Medication Sig Start Date End Date Taking? Authorizing Provider  albuterol (VENTOLIN HFA) 108 (90 Base) MCG/ACT inhaler Inhale 2 puffs into the lungs as needed.  05/09/19  Yes [provider]  atorvastatin (LIPITOR) 40 MG tablet TAKE 1 TABLET BY MOUTH EVERY DAY IN THE EVENING 03/01/19  Yes McLean, Elby Showers, MD  BREO ELLIPTA 100-25 MCG/INH AEPB Inhale 1 puff into the lungs daily. 06/20/19  Yes [provider]  busPIRone (BUSPAR) 10 MG tablet Take 10 mg by mouth 2 (two) times daily as needed. 07/23/19  Yes [provider]  diltiazem (CARDIZEM CD) 240 MG 24 hr capsule TAKE 1 CAPSULE BY MOUTH EVERY DAY 03/01/19  Yes [name], MD  DULoxetine (CYMBALTA) 20 MG capsule Take 1 capsule (20 mg total) by mouth daily. 06/24/17  Yes Nedrud, Larena Glassman, MD  ELIQUIS 5 MG TABS tablet TAKE 1 TABLET BY MOUTH TWICE A DAY 03/21/19  Yes [name], MD  ergocalciferol (VITAMIN D2) 1.25 MG (50000 UT) capsule Take 1 capsule (50,000 Units total) by mouth once a week. 07/09/19  Yes Derek Jack, MD  ferrous sulfate 325 (65 FE) MG EC tablet Take 325 mg by mouth daily with breakfast.   Yes [provider]  imatinib (GLEEVEC) 100 MG tablet Take 1 tablet (100 mg total) by mouth daily. Take with meals and large glass of water.Caution:Chemotherapy 07/24/19  Yes Derek Jack, MD  metoprolol succinate (TOPROL-XL) 25 MG 24 hr tablet Take 1 tablet (25 mg total) by mouth 2 (two) times daily. 07/16/19  Yes [name], MD  Multiple Vitamins-Minerals (MULTIVITAMIN WITH  MINERALS) tablet Take 1 tablet by mouth daily.   Yes [provider]  naproxen sodium (ALEVE) 220 MG tablet Take 220 mg by mouth daily as needed (for pain).   Yes [provider]  potassium chloride SA (KLOR-CON M20) 20 MEQ tablet Take 2 tablets (40 mEq total) by mouth 2 (two) times daily. 07/17/19  Yes [name], MD  prochlorperazine (COMPAZINE) 10 MG tablet Take 1 tablet (10 mg total) by mouth every 6 (six) hours as needed for nausea or vomiting. 07/24/19  Yes Derek Jack, MD  torsemide (DEMADEX) 20 MG tablet Take 4 tablets by mouth every morning and 2 tablets by mouth every other afternoon 07/16/19  Yes [name], MD    Allergies    Patient has no known allergies.  Review of Systems   Review of Systems  Unable to perform ROS: Acuity of condition    Physical Exam Updated Vital Signs BP 109/66 (BP Location: Right Arm)   Pulse (!) 110   Temp 98.4 F (36.9 C) (Oral)   Resp (!) 30   Ht 1.6 m (5\' 3" )   Wt 83 kg   SpO2 94%   BMI 32.42 kg/m   Physical Exam Vitals and nursing note reviewed.  Constitutional:      General: She is in acute distress.     Appearance: She is well-developed. She is toxic-appearing.  HENT:     Head: Normocephalic and atraumatic.     Mouth/Throat:     Pharynx: No oropharyngeal exudate or posterior oropharyngeal erythema.  Eyes:     General: No scleral icterus.       Right eye: No discharge.        Left eye: No discharge.     Conjunctiva/sclera: Conjunctivae normal.     Pupils: Pupils are equal, round, and reactive to light.  Neck:     Thyroid: No thyromegaly.     Vascular: No JVD.  Cardiovascular:     Rate and Rhythm: Regular rhythm. Tachycardia present.     Heart sounds: Normal heart sounds. No murmur. No friction rub. No gallop.      Comments: Heart rate of approximately 110 to 120 bpm Pulmonary:     Effort: Respiratory distress present.     Breath sounds: Rhonchi and rales present. No wheezing.  Comments: Lung sounds are overall fairly clear, there is no obvious wheezing, she has good air movement, she is very tachypneic and using accessory muscles speaking in very short and sentences Abdominal:     General: Bowel sounds are normal. There is no distension.     Palpations: Abdomen is soft. There is no mass.     Tenderness: There is no abdominal tenderness.  Musculoskeletal:        General: No tenderness. Normal range of motion.     Cervical back: Normal range of motion and neck supple.  Lymphadenopathy:     Cervical: No cervical adenopathy.  Skin:    General: Skin is warm and dry.     Findings: No erythema or rash.  Neurological:     Mental Status: She is alert.     Coordination: Coordination normal.  Psychiatric:        Behavior: Behavior normal.     ED Results / Procedures / Treatments   Labs (all labs ordered are listed, but only abnormal results are displayed) Labs Reviewed  CBC WITH DIFFERENTIAL/PLATELET - Abnormal; Notable for the following components:      Result Value   WBC 18.3 (*)    RBC 3.41 (*)    Hemoglobin 9.0 (*)    HCT 30.7 (*)    MCHC 29.3 (*)    RDW 17.5 (*)    Platelets 2,257 (*)    nRBC 0.3 (*)    Neutro Abs 13.6 (*)    Monocytes Absolute 2.0 (*)    Basophils Absolute 0.4 (*)    Abs Immature Granulocytes 0.38 (*)    All other components within normal limits  COMPREHENSIVE METABOLIC PANEL - Abnormal; Notable for the following components:   Glucose, Bld 146 (*)    Creatinine, Ser 1.20 (*)    GFR calc non Af Amer 42 (*)    GFR calc Af Amer 49 (*)    All other components within normal limits  BRAIN NATRIURETIC PEPTIDE - Abnormal; Notable for the following components:   B Natriuretic Peptide 590.0 (*)    All other components within normal limits  APTT - Abnormal; Notable for the following components:   aPTT 40 (*)    All other components within normal limits  PROTIME-INR - Abnormal; Notable for the following components:   Prothrombin Time  21.6 (*)    INR 1.9 (*)    All other components within normal limits  RESPIRATORY PANEL BY RT PCR (FLU A&B, COVID)  CULTURE, BLOOD (ROUTINE X 2)  CULTURE, BLOOD (ROUTINE X 2)  URINE CULTURE  LACTIC ACID, PLASMA  LACTIC ACID, PLASMA  URINALYSIS, ROUTINE W REFLEX MICROSCOPIC  PROCALCITONIN  PROCALCITONIN  TROPONIN I (HIGH SENSITIVITY)  TROPONIN I (HIGH SENSITIVITY)    EKG EKG Interpretation  Date/Time:  Friday July 27 2019 15:29:07 EST Ventricular Rate:  124 PR Interval:    QRS Duration: 81 QT Interval:  352 QTC Calculation: 485 R Axis:   37 Text Interpretation: Atrial fibrillation Paired ventricular premature complexes Borderline repol abnrm, anterolateral leads Minimal ST elevation, inferior leads since last tracing no significant change Confirmed by Noemi Chapel [phone]) on 07/27/2019 4:56:23 PM   Radiology DG Chest Port 1 View  Result Date: 07/27/2019 CLINICAL DATA:  Shortness of breath and wheezing EXAM: PORTABLE CHEST 1 VIEW COMPARISON:  06/24/2019, 02/09/2017 FINDINGS: Post sternotomy changes. Appendage clip. Trace pleural effusions. Enlarged cardiomediastinal silhouette with central vascular congestion. Increased hazy opacity at the right base. Aortic atherosclerosis. No pneumothorax. IMPRESSION: 1.  Cardiomegaly with mild central congestion and trace pleural effusions. 2. Slight interval increase in hazy right basilar opacity, atelectasis versus mild pneumonia. Electronically Signed   By: Donavan Foil M.D.   On: 07/27/2019 15:44    Procedures .Critical Care Performed by: Noemi Chapel, MD Authorized by: Noemi Chapel, MD   Critical care provider statement:    Critical care time (minutes):  35   Critical care time was exclusive of:  Separately billable procedures and treating other patients and teaching time   Critical care was necessary to treat or prevent imminent or life-threatening deterioration of the following conditions:  Sepsis and respiratory failure    Critical care was time spent personally by me on the following activities:  Blood draw for specimens, development of treatment plan with patient or surrogate, discussions with consultants, evaluation of patient's response to treatment, examination of patient, obtaining history from patient or surrogate, ordering and performing treatments and interventions, ordering and review of laboratory studies, ordering and review of radiographic studies, pulse oximetry, re-evaluation of patient's condition and review of old charts   (including critical care time)  Medications Ordered in ED Medications  0.9 %  sodium chloride infusion (has no administration in time range)  ceFEPIme (MAXIPIME) 2 g in sodium chloride 0.9 % 100 mL IVPB (has no administration in time range)  vancomycin (VANCOREADY) IVPB 1500 mg/300 mL (has no administration in time range)  albuterol (VENTOLIN HFA) 108 (90 Base) MCG/ACT inhaler 2 puff (2 puffs Inhalation Given 07/27/19 1542)  AeroChamber Plus Flo-Vu Medium MISC 1 each (1 each Other Given 07/27/19 1542)  methylPREDNISolone sodium succinate (SOLU-MEDROL) 125 mg/2 mL injection 125 mg (125 mg Intravenous Given 07/27/19 1712)    ED Course  I have reviewed the triage vital signs and the nursing notes.  Pertinent labs & imaging results that were available during my care of the patient were reviewed by me and considered in my medical decision making (see chart for details).  Clinical Course as of Jul 26 1722  Fri Jul 27, 2019  1704 Patient reports that she does not use oxygen at home, states that she is always a little bit short of breath but is much more so today.  I have repeated my evaluation of this patient multiple times and she continues to be tachycardic just above 100 bpm, her respiratory rate continues to be between 24 and 30 and her oxygen is between 89 and 91%.  She is now getting 2 L by nasal cannula.  I have personally looked at her chest x-ray and interpreted it and it shows  that she does have some vascular congestion, mild edema and possible bilateral infiltrates that could represent pneumonia.  This would not be surprising giving her worsening shortness of breath.  Additionally her lab work is abnormal and more so than at her baseline given her CML.  I will consult with hospitalist for admission, the patient likely has sepsis from a pulmonary source and was treated for healthcare associated pneumonia   [BM]    Clinical Course User Index [BM] Noemi Chapel, MD   MDM Rules/Calculators/A&P                      This patient is in acute and ongoing respiratory distress likely in need of further evaluation and stabilizing care.  At this time the patient will need evaluation for the cause of her shortness of breath including infectious, inflammatory or reactive airway disease, she states that she does not  smoke anymore.  We will also check cardiac enzymes and a BNP with a chest x-ray.  The patient is agreeable, she may need BiPAP, rapid Covid swab obtained  D/w Dr. Nehemiah Settle who will admit  Final Clinical Impression(s) / ED Diagnoses Final diagnoses:  Acute respiratory failure with hypoxia ([place])  Sepsis, due to unspecified organism, unspecified whether acute organ dysfunction present [place])    Rx / DC Orders ED Discharge Orders    None       Noemi Chapel, MD 07/27/19 1724

## 2019-07-27 NOTE — H&P (Addendum)
History and Physical  Nicole [name] [id number] DOB: 1935/10/23 DOA: 07/27/2019  Referring physician: Dr Sabra Heck, ED physician PCP: Frances Maywood, FNP  Outpatient Specialists:  Patient Coming From: home  Chief Complaint: SOB  HPI: Nicole [name] is a 84 y.o. female with a history of COPD, diastolic heart failure, atrial fibrillation on anticoagulation, CML with thrombocytosis.  She was recently admitted about a month ago for similar complaints.  She DU with A. fib and RVR.  She did complete doxycycline for total of 5 days for bronchitis/bronchiectasis.  Today, she presents with 1 week of worsening shortness of breath which has been worsening and is now severe.  Symptoms are worse with ambulation and improved with rest.  No other palliating or provoking factors.  Have mild cough.  She had been prescribed doxycycline and prednisone by her oncology physician, although she has not been get these medications.  Emergency Department Course: X-ray shows early pneumonia.  White count of 8.3.  BNP 590.  Lactic acid 2.2 with improvement to 1.3.  Review of Systems:   Pt denies any fevers, chills, nausea, vomiting, diarrhea, constipation, abdominal pain, palpitations, headache, vision changes, lightheadedness, dizziness, melena, rectal bleeding.  Review of systems are otherwise negative  Past Medical History:  Diagnosis Date  . Anxiety   . Atrial fibrillation ([place])    with Rapid Ventricular response  . COPD (chronic obstructive pulmonary disease) ([place])   . Diastolic CHF ([place])   . Hyperlipidemia   . SOB (shortness of breath)    conplicated by underlying a fib with RVR,COPD, and CHF   Past Surgical History:  Procedure Laterality Date  . ABDOMINAL HYSTERECTOMY    . AORTIC VALVE REPLACEMENT N/A 03/05/2013   Procedure: AORTIC VALVE REPLACEMENT (AVR);  Surgeon: Gaye Pollack, MD;  Location: [place];  Service: Open Heart Surgery;  Laterality: N/A;  . BREAST BIOPSY     x3  . CARDIOVERSION N/A  02/13/2013   Procedure: CARDIOVERSION;  Surgeon: [name], MD;  Location: [facility] ENDOSCOPY;  Service: Cardiovascular;  Laterality: N/A;  . CARDIOVERSION N/A 03/01/2013   Procedure: CARDIOVERSION;  Surgeon: [name], MD;  Location: [facility] ENDOSCOPY;  Service: Cardiovascular;  Laterality: N/A;  . CARDIOVERSION N/A 06/23/2016   Procedure: CARDIOVERSION;  Surgeon: [name], MD;  Location: [place];  Service: Cardiovascular;  Laterality: N/A;  . CATARACT EXTRACTION    . COLONOSCOPY WITH PROPOFOL N/A 06/24/2017   Procedure: COLONOSCOPY WITH PROPOFOL;  Surgeon: Carol Ada, MD;  Location: [place];  Service: Endoscopy;  Laterality: N/A;  . ESOPHAGOGASTRODUODENOSCOPY N/A 06/24/2017   Procedure: ESOPHAGOGASTRODUODENOSCOPY (EGD);  Surgeon: Carol Ada, MD;  Location: [place];  Service: Endoscopy;  Laterality: N/A;  . INTRAOPERATIVE TRANSESOPHAGEAL ECHOCARDIOGRAM N/A 03/05/2013   Procedure: INTRAOPERATIVE TRANSESOPHAGEAL ECHOCARDIOGRAM;  Surgeon: Gaye Pollack, MD;  Location: [place] OR;  Service: Open Heart Surgery;  Laterality: N/A;  . LEFT AND RIGHT HEART CATHETERIZATION WITH CORONARY ANGIOGRAM N/A 03/02/2013   Procedure: LEFT AND RIGHT HEART CATHETERIZATION WITH CORONARY ANGIOGRAM;  Surgeon: [name], MD;  Location[facility] C[facility] LAB;  Service: Cardiovascular;  Laterality: N/A;  . MAZE N/A 03/05/2013   Procedure: MAZE;  Surgeon: Gaye Pollack, MD;  Location: [place];  Service: Open Heart Surgery;  Laterality: N/A;  . MITRAL VALVE REPAIR N/A 03/05/2013   Procedure: MITRAL VALVE REPAIR (MVR);  Surgeon: Gaye Pollack, MD;  Location: [place];  Service: Open Heart Surgery;  Laterality: N/A;  . ROTATOR CUFF REPAIR    . TEE  WITHOUT CARDIOVERSION N/A 03/01/2013   Procedure: TRANSESOPHAGEAL ECHOCARDIOGRAM (TEE);  Surgeon: [name], MD;  Location: [place];  Service: Cardiovascular;  Laterality: N/A;  talked to bev. booking no. is [id number] called trish to verify time  . TONSILLECTOMY       Social History:  reports that she quit smoking about 6 years ago. Her smoking use included cigarettes. She started smoking about 64 years ago. She has a 57.00 pack-year smoking history. She has never used smokeless tobacco. She reports that she does not drink alcohol or use drugs. Patient lives at home  No Known Allergies  Family History  Problem Relation Age of Onset  . Breast cancer Other        family history  . Heart disease Mother   . Throat cancer Father   . Heart disease Brother   . Heart attack Son   . Breast cancer Daughter       Prior to Admission medications   Medication Sig Start Date End Date Taking? Authorizing Provider  albuterol (VENTOLIN HFA) 108 (90 Base) MCG/ACT inhaler Inhale 2 puffs into the lungs as needed.  05/09/19  Yes [provider]  atorvastatin (LIPITOR) 40 MG tablet TAKE 1 TABLET BY MOUTH EVERY DAY IN THE EVENING 03/01/19  Yes McLean, Elby Showers, MD  BREO ELLIPTA 100-25 MCG/INH AEPB Inhale 1 puff into the lungs daily. 06/20/19  Yes [provider]  busPIRone (BUSPAR) 10 MG tablet Take 10 mg by mouth 2 (two) times daily as needed. 07/23/19  Yes [provider]  diltiazem (CARDIZEM CD) 240 MG 24 hr capsule TAKE 1 CAPSULE BY MOUTH EVERY DAY 03/01/19  Yes [name], MD  DULoxetine (CYMBALTA) 20 MG capsule Take 1 capsule (20 mg total) by mouth daily. 06/24/17  Yes Nedrud, Larena Glassman, MD  ELIQUIS 5 MG TABS tablet TAKE 1 TABLET BY MOUTH TWICE A DAY 03/21/19  Yes [name], MD  ergocalciferol (VITAMIN D2) 1.25 MG (50000 UT) capsule Take 1 capsule (50,000 Units total) by mouth once a week. 07/09/19  Yes Derek Jack, MD  ferrous sulfate 325 (65 FE) MG EC tablet Take 325 mg by mouth daily with breakfast.   Yes [provider]  imatinib (GLEEVEC) 100 MG tablet Take 1 tablet (100 mg total) by mouth daily. Take with meals and large glass of water.Caution:Chemotherapy 07/24/19  Yes Derek Jack, MD  metoprolol  succinate (TOPROL-XL) 25 MG 24 hr tablet Take 1 tablet (25 mg total) by mouth 2 (two) times daily. 07/16/19  Yes [name], MD  Multiple Vitamins-Minerals (MULTIVITAMIN WITH MINERALS) tablet Take 1 tablet by mouth daily.   Yes [provider]  naproxen sodium (ALEVE) 220 MG tablet Take 220 mg by mouth daily as needed (for pain).   Yes [provider]  potassium chloride SA (KLOR-CON M20) 20 MEQ tablet Take 2 tablets (40 mEq total) by mouth 2 (two) times daily. 07/17/19  Yes [name], MD  prochlorperazine (COMPAZINE) 10 MG tablet Take 1 tablet (10 mg total) by mouth every 6 (six) hours as needed for nausea or vomiting. 07/24/19  Yes Derek Jack, MD  torsemide (DEMADEX) 20 MG tablet Take 4 tablets by mouth every morning and 2 tablets by mouth every other afternoon 07/16/19  Yes [name], MD    Physical Exam: BP 109/66 (BP Location: Right Arm)   Pulse (!) 59   Temp 98.4 F (36.9 C) (Oral)   Resp (!) 24   Ht 5\' 3"  (  1.6 m)   Wt 83 kg   SpO2 91%   BMI 32.42 kg/m   . General: Elderly female. Awake and alert and oriented x3. No acute cardiopulmonary distress.  Marland Kitchen HEENT: Normocephalic atraumatic.  Right and left ears normal in appearance.  Pupils equal, round, reactive to light. Extraocular muscles are intact. Sclerae anicteric and noninjected.  Moist mucosal membranes. No mucosal lesions.  . Neck: Neck supple without lymphadenopathy. No carotid bruits. No masses palpated.  . Cardiovascular: Irregularly irregular rate with normal S1-S2 sounds. No murmurs, rubs, gallops auscultated. No JVD.  Marland Kitchen Respiratory: Rales in bases bilaterally. No accessory muscle use. . Abdomen: Soft, nontender, nondistended. Active bowel sounds. No masses or hepatosplenomegaly  . Skin: No rashes, lesions, or ulcerations.  Dry, warm to touch. 2+ dorsalis pedis and radial pulses. . Musculoskeletal: No calf or leg pain. All major joints not erythematous nontender.  No upper or lower  joint deformation.  Good ROM.  No contractures  . Psychiatric: Intact judgment and insight. Pleasant and cooperative. . Neurologic: No focal neurological deficits. Strength is 5/5 and symmetric in upper and lower extremities.  Cranial nerves II through XII are grossly intact.           Labs on Admission: I have personally reviewed following labs and imaging studies  CBC: Recent Labs  Lab 07/24/19 1056 07/27/19 1529  WBC 13.6* 18.3*  NEUTROABS 10.1* 13.6*  HGB 8.7* 9.0*  HCT 29.2* 30.7*  MCV 91.0 90.0  PLT 1,770* [id number]*   Basic Metabolic Panel: Recent Labs  Lab 07/24/19 1056 07/27/19 1529  NA 140 138  K 4.1 3.8  CL 102 100  CO2 28 27  GLUCOSE 129* 146*  BUN 18 21  CREATININE 1.20* 1.20*  CALCIUM 8.9 9.0  MG 2.5*  --    GFR: Estimated Creatinine Clearance: 36.9 mL/min (A) (by C-G formula based on SCr of 1.2 mg/dL (H)). Liver Function Tests: Recent Labs  Lab 07/24/19 1056 07/27/19 1529  AST 27 28  ALT 31 27  ALKPHOS 65 65  BILITOT 0.9 1.0  PROT 6.4* 6.6  ALBUMIN 3.8 3.8   No results for input(s): LIPASE, AMYLASE in the last 168 hours. No results for input(s): AMMONIA in the last 168 hours. Coagulation Profile: Recent Labs  Lab 07/27/19 1529  INR 1.9*   Cardiac Enzymes: No results for input(s): CKTOTAL, CKMB, CKMBINDEX, TROPONINI in the last 168 hours. BNP (last 3 results) No results for input(s): PROBNP in the last 8760 hours. HbA1C: No results for input(s): HGBA1C in the last 72 hours. CBG: No results for input(s): GLUCAP in the last 168 hours. Lipid Profile: No results for input(s): CHOL, HDL, LDLCALC, TRIG, CHOLHDL, LDLDIRECT in the last 72 hours. Thyroid Function Tests: No results for input(s): TSH, T4TOTAL, FREET4, T3FREE, THYROIDAB in the last 72 hours. Anemia Panel: No results for input(s): VITAMINB12, FOLATE, FERRITIN, TIBC, IRON, RETICCTPCT in the last 72 hours. Urine analysis:    Component Value Date/Time   COLORURINE STRAW (A)  02/09/2017 1330   APPEARANCEUR CLEAR 02/09/2017 1330   LABSPEC 1.006 02/09/2017 1330   PHURINE 7.0 02/09/2017 1330   GLUCOSEU NEGATIVE 02/09/2017 1330   HGBUR NEGATIVE 02/09/2017 1330   BILIRUBINUR NEGATIVE 02/09/2017 1330   KETONESUR NEGATIVE 02/09/2017 1330   PROTEINUR NEGATIVE 02/09/2017 1330   UROBILINOGEN 1.0 03/04/2013 1230   NITRITE NEGATIVE 02/09/2017 1330   LEUKOCYTESUR NEGATIVE 02/09/2017 1330   Sepsis Labs: @LABRCNTIP (procalcitonin:4,lacticidven:4) ) Recent Results (from the past 240 hour(s))  Respiratory Panel by RT  PCR (Flu A&B, Covid) - Nasopharyngeal Swab     Status: None   Collection Time: 07/27/19  3:45 PM   Specimen: Nasopharyngeal Swab  Result Value Ref Range Status   SARS Coronavirus 2 by RT PCR NEGATIVE NEGATIVE Final    Comment: (NOTE) SARS-CoV-2 target nucleic acids are NOT DETECTED. The SARS-CoV-2 RNA is generally detectable in upper respiratoy specimens during the acute phase of infection. The lowest concentration of SARS-CoV-2 viral copies this assay can detect is 131 copies/mL. A negative result does not preclude SARS-Cov-2 infection and should not be used as the sole basis for treatment or other patient management decisions. A negative result may occur with  improper specimen collection/handling, submission of specimen other than nasopharyngeal swab, presence of viral mutation(s) within the areas targeted by this assay, and inadequate number of viral copies (<131 copies/mL). A negative result must be combined with clinical observations, patient history, and epidemiological information. The expected result is Negative. Fact Sheet for Patients:  PinkCheek.be Fact Sheet for Healthcare Providers:  GravelBags.it This test is not yet ap proved or cleared by the [place] FDA and  has been authorized for detection and/or diagnosis of SARS-CoV-2 by FDA under an Emergency Use Authorization (EUA).  This EUA will remain  in effect (meaning this test can be used) for the duration of the COVID-19 declaration under Section 564(b)(1) of the Act, 21 U.S.C. section 360bbb-3(b)(1), unless the authorization is terminated or revoked sooner.    Influenza A by PCR NEGATIVE NEGATIVE Final   Influenza B by PCR NEGATIVE NEGATIVE Final    Comment: (NOTE) The Xpert Xpress SARS-CoV-2/FLU/RSV assay is intended as an aid in  the diagnosis of influenza from Nasopharyngeal swab specimens and  should not be used as a sole basis for treatment. Nasal washings and  aspirates are unacceptable for Xpert Xpress SARS-CoV-2/FLU/RSV  testing. Fact Sheet for Patients: PinkCheek.be Fact Sheet for Healthcare Providers: GravelBags.it This test is not yet approved or cleared by the [place] FDA and  has been authorized for detection and/or diagnosis of SARS-CoV-2 by  FDA under an Emergency Use Authorization (EUA). This EUA will remain  in effect (meaning this test can be used) for the duration of the  Covid-19 declaration under Section 564(b)(1) of the Act, 21  U.S.C. section 360bbb-3(b)(1), unless the authorization is  terminated or revoked. Performed at [facility], [address], [place], [place] [address]   Blood Culture (routine x 2)     Status: None (Preliminary result)   Collection Time: 07/27/19  5:20 PM   Specimen: BLOOD RIGHT HAND  Result Value Ref Range Status   Specimen Description BLOOD RIGHT HAND  Final   Special Requests   Final    BOTTLES DRAWN AEROBIC AND ANAEROBIC Blood Culture adequate volume Performed at [facility], [address], [place], [place] [address]    Culture PENDING  Incomplete   Report Status PENDING  Incomplete  Blood Culture (routine x 2)     Status: None (Preliminary result)   Collection Time: 07/27/19  5:20 PM   Specimen: Left Antecubital; Blood  Result Value Ref Range Status   Specimen Description LEFT  ANTECUBITAL  Final   Special Requests   Final    BOTTLES DRAWN AEROBIC AND ANAEROBIC Blood Culture adequate volume Performed at [facility], [address], [place],  [address]    Culture PENDING  Incomplete   Report Status PENDING  Incomplete     Radiological Exams on Admission: DG Chest Port 1  View  Result Date: 07/27/2019 CLINICAL DATA:  Shortness of breath and wheezing EXAM: PORTABLE CHEST 1 VIEW COMPARISON:  06/24/2019, 02/09/2017 FINDINGS: Post sternotomy changes. Appendage clip. Trace pleural effusions. Enlarged cardiomediastinal silhouette with central vascular congestion. Increased hazy opacity at the right base. Aortic atherosclerosis. No pneumothorax. IMPRESSION: 1. Cardiomegaly with mild central congestion and trace pleural effusions. 2. Slight interval increase in hazy right basilar opacity, atelectasis versus mild pneumonia. Electronically Signed   By: Donavan Foil M.D.   On: 07/27/2019 15:44    EKG: Independently reviewed.  A. fib.  No acute ST changes.  Assessment/Plan: Active Problems:   CAD (coronary artery disease)   Atrial fibrillation (HCC)   Chronic diastolic CHF (congestive heart failure) (HCC)   Aortic stenosis   Thrombocytosis (HCC)   COPD exacerbation (HCC)   CML (chronic myelocytic leukemia) ([place])   HCAP (healthcare-associated pneumonia)    This patient was discussed with the ED physician, including pertinent vitals, physical exam findings, labs, and imaging.  We also discussed care given by the ED provider.  1. Shortness of breath with concern for HCAP Antibiotics: Vanc and Cefepime MRSA screen Robitussin Blood cultures drawn in the emergency department Sputum cultures CBC tomorrow Procalcitonin pending Respiratory virus panel Covid negative 2. Atrial fibrillation a. Rate controlled b. Continue anticoagulation 3. COPD a. Steroids given b. We will continue inhalers 4. CML with thrombocytosis a. Repeat in the morning 5. Chronic  diastolic heart failure a. No evidence of failure currently  DVT prophylaxis: Full dose anticoagulation Consultants: None Code Status: DNR confirmed by patient Family Communication: I discussed patient's care with her daughter [name]. Disposition Plan: Pending   Truett Mainland, DO

## 2019-07-27 NOTE — Progress Notes (Signed)
Pharmacy Antibiotic Note  Nicole [name] is a 84 y.o. female with recent antibiotic and steroid courses in January and [place] for [facility] admitted on 07/27/2019 with sepsis.  Pharmacy has been consulted for vancomycin and cefepime dosing.  Plan: Vancomycin 1500 mg IV once then 500 mg IV Q 24 hrs. Goal AUC 400-550. Expected AUC: 412 SCr used: 1.2  Cefepime 2 g iv q 12 hours  F/U renal function, culture results, MRSA PCR, levels if indicated   Height: 5\' 3"  (160 cm) Weight: 183 lb (83 kg) IBW/kg (Calculated) : 52.4  Temp (24hrs), Avg:98.4 F (36.9 C), Min:98.4 F (36.9 C), Max:98.4 F (36.9 C)  Recent Labs  Lab 07/24/19 1056 07/27/19 1529  WBC 13.6* 18.3*  CREATININE 1.20* 1.20*    Estimated Creatinine Clearance: 36.9 mL/min (A) (by C-G formula based on SCr of 1.2 mg/dL (H)).    No Known Allergies  Antimicrobials this admission: 2/26 vancomycin >>  2/26 cefepime >>   Dose adjustments this admission:   Microbiology results: 2/26 BCx:  2/26 UCx:   MRSA PCR  Thank you for allowing pharmacy to be a part of this patient's care.  Napoleon Form 07/27/2019 5:23 PM

## 2019-07-27 NOTE — ED Notes (Signed)
Date and time results received: 07/27/19 1602 (use smartphrase ".now" to insert current time)  Test: platelets Critical Value: 2257  Name of Provider Notified: Dr. Sabra Heck  Orders Received? Or Actions Taken?: n/a

## 2019-07-27 NOTE — ED Triage Notes (Signed)
Pt with sob ongoing for a week per pt, pt with hx of COPD.

## 2019-07-28 ENCOUNTER — Inpatient Hospital Stay (HOSPITAL_COMMUNITY): Payer: Medicare Other

## 2019-07-28 LAB — BASIC METABOLIC PANEL
Anion gap: 10 (ref 5–15)
BUN: 21 mg/dL (ref 8–23)
CO2: 24 mmol/L (ref 22–32)
Calcium: 8.7 mg/dL — ABNORMAL LOW (ref 8.9–10.3)
Chloride: 104 mmol/L (ref 98–111)
Creatinine, Ser: 1.14 mg/dL — ABNORMAL HIGH (ref 0.44–1.00)
GFR calc Af Amer: 52 mL/min — ABNORMAL LOW (ref >60)
GFR calc non Af Amer: 45 mL/min — ABNORMAL LOW (ref >60)
Glucose, Bld: 193 mg/dL — ABNORMAL HIGH (ref 70–99)
Potassium: 4.4 mmol/L (ref 3.5–5.1)
Sodium: 138 mmol/L (ref 135–145)

## 2019-07-28 LAB — CBC
HCT: 28.2 % — ABNORMAL LOW (ref 36.0–46.0)
Hemoglobin: 8.2 g/dL — ABNORMAL LOW (ref 12.0–15.0)
MCH: 26.5 pg (ref 26.0–34.0)
MCHC: 29.1 g/dL — ABNORMAL LOW (ref 30.0–36.0)
MCV: 91 fL (ref 80.0–100.0)
Platelets: 1924 10*3/uL (ref 150–400)
RBC: 3.1 MIL/uL — ABNORMAL LOW (ref 3.87–5.11)
RDW: 17.5 % — ABNORMAL HIGH (ref 11.5–15.5)
WBC: 16 10*3/uL — ABNORMAL HIGH (ref 4.0–10.5)
nRBC: 0.2 % (ref 0.0–0.2)

## 2019-07-28 LAB — BRAIN NATRIURETIC PEPTIDE: B Natriuretic Peptide: 851 pg/mL — ABNORMAL HIGH (ref 0.0–100.0)

## 2019-07-28 LAB — PROCALCITONIN: Procalcitonin: <0.1 ng/mL

## 2019-07-28 LAB — STREP PNEUMONIAE URINARY ANTIGEN: Strep Pneumo Urinary Antigen: NEGATIVE

## 2019-07-28 MED ORDER — ACETAMINOPHEN 325 MG PO TABS
650.0000 mg | ORAL_TABLET | Freq: Four times a day (QID) | ORAL | Status: DC | PRN
  Administered 2019-07-28 – 2019-07-31 (×2): 650 mg via ORAL
  Filled 2019-07-28 (×2): qty 2

## 2019-07-28 MED ORDER — POTASSIUM CHLORIDE CRYS ER 20 MEQ PO TBCR
40.0000 meq | EXTENDED_RELEASE_TABLET | Freq: Every day | ORAL | Status: DC
  Administered 2019-07-29: 40 meq via ORAL
  Filled 2019-07-28: qty 2

## 2019-07-28 MED ORDER — FUROSEMIDE 10 MG/ML IJ SOLN
80.0000 mg | Freq: Once | INTRAMUSCULAR | Status: AC
  Administered 2019-07-28: 80 mg via INTRAVENOUS
  Filled 2019-07-28: qty 8

## 2019-07-28 MED ORDER — ENSURE ENLIVE PO LIQD
237.0000 mL | ORAL | Status: DC
  Administered 2019-07-28 – 2019-08-08 (×10): 237 mL via ORAL

## 2019-07-28 MED ORDER — ADULT MULTIVITAMIN W/MINERALS CH
1.0000 | ORAL_TABLET | Freq: Every day | ORAL | Status: DC
  Administered 2019-07-28 – 2019-08-09 (×13): 1 via ORAL
  Filled 2019-07-28 (×13): qty 1

## 2019-07-28 MED ORDER — TORSEMIDE 20 MG PO TABS
20.0000 mg | ORAL_TABLET | Freq: Two times a day (BID) | ORAL | Status: DC
  Administered 2019-07-28 (×2): 20 mg via ORAL
  Filled 2019-07-28 (×3): qty 1

## 2019-07-28 MED ORDER — FLUTICASONE FUROATE-VILANTEROL 100-25 MCG/INH IN AEPB
INHALATION_SPRAY | RESPIRATORY_TRACT | Status: AC
  Filled 2019-07-28: qty 28

## 2019-07-28 NOTE — Progress Notes (Signed)
PROGRESS NOTE    Nicole [name]  [id number] DOB: 1935/10/01 DOA: 07/27/2019 PCP: Frances Maywood, FNP   Brief Narrative:  Per HPI: Nicole [name] is a 84 y.o. female with a history of COPD, diastolic heart failure, atrial fibrillation on anticoagulation, CML with thrombocytosis.  She was recently admitted about a month ago for similar complaints.  She DU with A. fib and RVR.  She did complete doxycycline for total of 5 days for bronchitis/bronchiectasis.  Today, she presents with 1 week of worsening shortness of breath which has been worsening and is now severe.  Symptoms are worse with ambulation and improved with rest.  No other palliating or provoking factors.  Have mild cough.  She had been prescribed doxycycline and prednisone by her oncology physician, although she has not been get these medications.  2/27: Patient was admitted with dyspnea likely secondary to developing HCAP.  She appears to be feeling better this morning and has been started on vancomycin and cefepime.  She was receiving a high rate of IV fluids which has been discontinued and she has been started back on her torsemide today.  We will continue to monitor strict I's and O's and wean oxygen as tolerated.  Assessment & Plan:   Active Problems:   CAD (coronary artery disease)   Atrial fibrillation (HCC)   Chronic diastolic CHF (congestive heart failure) (HCC)   Aortic stenosis   Thrombocytosis (HCC)   COPD exacerbation (HCC)   CML (chronic myelocytic leukemia) ([place])   HCAP (healthcare-associated pneumonia)   Acute hypoxemic respiratory failure likely secondary to HCAP with some mild pulmonary vascular congestion -Continue vancomycin and cefepime as ordered with MRSA screen pending -Anticipate can de-escalate antibiotics fairly soon as procalcitonin is low -Breathing treatments as needed with incentive spirometry to help wean oxygen -Patient is normally on room air at home  Atrial fibrillation -Currently rate  controlled -Continue metoprolol and apixaban  COPD with mild exacerbation related to above -Continue on steroids and inhalers as prescribed -Wean oxygen as tolerated and if not able may require home oxygen  CML with thrombocytosis -Levels improved this morning and will recheck CBC  Chronic diastolic heart failure -Mild pulmonary vascular congestion noted -Hold IV fluids and restart home torsemide -Monitor daily weights   DVT prophylaxis: Apixaban Code Status: DNR Family Communication: We will call daughter Disposition Plan: Continue current antibiotics and wean oxygen as tolerated   Consultants:   None  Procedures:   See below  Antimicrobials:  Anti-infectives (From admission, onward)   Start     Dose/Rate Route Frequency Ordered Stop   07/28/19 1800  vancomycin (VANCOREADY) IVPB 500 mg/100 mL     500 mg 100 mL/hr over 60 Minutes Intravenous Every 24 hours 07/27/19 1744     07/27/19 1800  ceFEPIme (MAXIPIME) 2 g in sodium chloride 0.9 % 100 mL IVPB     2 g 200 mL/hr over 30 Minutes Intravenous Every 12 hours 07/27/19 1714     07/27/19 1800  vancomycin (VANCOREADY) IVPB 1500 mg/300 mL     1,500 mg 150 mL/hr over 120 Minutes Intravenous  Once 07/27/19 1714 07/27/19 2012   07/27/19 1700  vancomycin (VANCOCIN) IVPB 1000 mg/200 mL premix  Status:  Discontinued     1,000 mg 200 mL/hr over 60 Minutes Intravenous  Once 07/27/19 1658 07/27/19 1714   07/27/19 1700  ceFEPIme (MAXIPIME) 2 g in sodium chloride 0.9 % 100 mL IVPB  Status:  Discontinued     2 g 200 mL/hr over  30 Minutes Intravenous  Once 07/27/19 1658 07/27/19 1714       Subjective: Patient seen and evaluated today with no new acute complaints or concerns. No acute concerns or events noted overnight.  Her breathing appears to have improved.  She still remains on 2 L nasal cannula oxygen.  Objective: Vitals:   07/28/19 0149 07/28/19 0557 07/28/19 0840 07/28/19 0948  BP:  116/64 107/60   Pulse:  77    Resp:   (!) 22    Temp:  (!) 97.4 F (36.3 C)    TempSrc:  Oral    SpO2: 96% 97%  94%  Weight:      Height:        Intake/Output Summary (Last 24 hours) at 07/28/2019 1001 Last data filed at 07/28/2019 [id number] Gross per 24 hour  Intake 400 ml  Output 350 ml  Net 50 ml   Filed Weights   07/27/19 1506  Weight: 83 kg    Examination:  General exam: Appears calm and comfortable, obese Respiratory system: Clear to auscultation. Respiratory effort normal.  Currently on 2 L nasal cannula oxygen Cardiovascular system: S1 & S2 heard, RRR. No JVD, murmurs, rubs, gallops or clicks. No pedal edema. Gastrointestinal system: Abdomen is nondistended, soft and nontender. No organomegaly or masses felt. Normal bowel sounds heard. Central nervous system: Alert and oriented. No focal neurological deficits. Extremities: Symmetric 5 x 5 power. Skin: No rashes, lesions or ulcers Psychiatry: Judgement and insight appear normal. Mood & affect appropriate.     Data Reviewed: I have personally reviewed following labs and imaging studies  CBC: Recent Labs  Lab 07/24/19 1056 07/27/19 1529 07/28/19 0710  WBC 13.6* 18.3* 16.0*  NEUTROABS 10.1* 13.6*  --   HGB 8.7* 9.0* 8.2*  HCT 29.2* 30.7* 28.2*  MCV 91.0 90.0 91.0  PLT 1,770* 2,257* Q000111Q*   Basic Metabolic Panel: Recent Labs  Lab 07/24/19 1056 07/27/19 1529 07/28/19 0710  NA 140 138 138  K 4.1 3.8 4.4  CL 102 100 104  CO2 28 27 24   GLUCOSE 129* 146* 193*  BUN 18 21 21   CREATININE 1.20* 1.20* 1.14*  CALCIUM 8.9 9.0 8.7*  MG 2.5*  --   --    GFR: Estimated Creatinine Clearance: 38.8 mL/min (A) (by C-G formula based on SCr of 1.14 mg/dL (H)). Liver Function Tests: Recent Labs  Lab 07/24/19 1056 07/27/19 1529  AST 27 28  ALT 31 27  ALKPHOS 65 65  BILITOT 0.9 1.0  PROT 6.4* 6.6  ALBUMIN 3.8 3.8   No results for input(s): LIPASE, AMYLASE in the last 168 hours. No results for input(s): AMMONIA in the last 168 hours. Coagulation  Profile: Recent Labs  Lab 07/27/19 1529  INR 1.9*   Cardiac Enzymes: No results for input(s): CKTOTAL, CKMB, CKMBINDEX, TROPONINI in the last 168 hours. BNP (last 3 results) No results for input(s): PROBNP in the last 8760 hours. HbA1C: No results for input(s): HGBA1C in the last 72 hours. CBG: No results for input(s): GLUCAP in the last 168 hours. Lipid Profile: No results for input(s): CHOL, HDL, LDLCALC, TRIG, CHOLHDL, LDLDIRECT in the last 72 hours. Thyroid Function Tests: No results for input(s): TSH, T4TOTAL, FREET4, T3FREE, THYROIDAB in the last 72 hours. Anemia Panel: No results for input(s): VITAMINB12, FOLATE, FERRITIN, TIBC, IRON, RETICCTPCT in the last 72 hours. Sepsis Labs: Recent Labs  Lab 07/27/19 1709 07/27/19 1716 07/27/19 1925 07/28/19 0710  PROCALCITON  --  <0.10  --  <0.10  LATICACIDVEN  2.2*  --  1.3  --     Recent Results (from the past 240 hour(s))  Respiratory Panel by RT PCR (Flu A&B, Covid) - Nasopharyngeal Swab     Status: None   Collection Time: 07/27/19  3:45 PM   Specimen: Nasopharyngeal Swab  Result Value Ref Range Status   SARS Coronavirus 2 by RT PCR NEGATIVE NEGATIVE Final    Comment: (NOTE) SARS-CoV-2 target nucleic acids are NOT DETECTED. The SARS-CoV-2 RNA is generally detectable in upper respiratoy specimens during the acute phase of infection. The lowest concentration of SARS-CoV-2 viral copies this assay can detect is 131 copies/mL. A negative result does not preclude SARS-Cov-2 infection and should not be used as the sole basis for treatment or other patient management decisions. A negative result may occur with  improper specimen collection/handling, submission of specimen other than nasopharyngeal swab, presence of viral mutation(s) within the areas targeted by this assay, and inadequate number of viral copies (<131 copies/mL). A negative result must be combined with clinical observations, patient history, and epidemiological  information. The expected result is Negative. Fact Sheet for Patients:  PinkCheek.be Fact Sheet for Healthcare Providers:  GravelBags.it This test is not yet ap proved or cleared by the [place] FDA and  has been authorized for detection and/or diagnosis of SARS-CoV-2 by FDA under an Emergency Use Authorization (EUA). This EUA will remain  in effect (meaning this test can be used) for the duration of the COVID-19 declaration under Section 564(b)(1) of the Act, 21 U.S.C. section 360bbb-3(b)(1), unless the authorization is terminated or revoked sooner.    Influenza A by PCR NEGATIVE NEGATIVE Final   Influenza B by PCR NEGATIVE NEGATIVE Final    Comment: (NOTE) The Xpert Xpress SARS-CoV-2/FLU/RSV assay is intended as an aid in  the diagnosis of influenza from Nasopharyngeal swab specimens and  should not be used as a sole basis for treatment. Nasal washings and  aspirates are unacceptable for Xpert Xpress SARS-CoV-2/FLU/RSV  testing. Fact Sheet for Patients: PinkCheek.be Fact Sheet for Healthcare Providers: GravelBags.it This test is not yet approved or cleared by the [place] FDA and  has been authorized for detection and/or diagnosis of SARS-CoV-2 by  FDA under an Emergency Use Authorization (EUA). This EUA will remain  in effect (meaning this test can be used) for the duration of the  Covid-19 declaration under Section 564(b)(1) of the Act, 21  U.S.C. section 360bbb-3(b)(1), unless the authorization is  terminated or revoked. Performed at [facility], [address], [place], [place] [address]   Blood Culture (routine x 2)     Status: None (Preliminary result)   Collection Time: 07/27/19  5:20 PM   Specimen: BLOOD RIGHT HAND  Result Value Ref Range Status   Specimen Description BLOOD RIGHT HAND  Final   Special Requests   Final    BOTTLES DRAWN AEROBIC  AND ANAEROBIC Blood Culture adequate volume   Culture   Final    NO GROWTH < 24 HOURS Performed at [facility], [address] [place], [place] [address]    Report Status PENDING  Incomplete  Blood Culture (routine x 2)     Status: None (Preliminary result)   Collection Time: 07/27/19  5:20 PM   Specimen: Left Antecubital; Blood  Result Value Ref Range Status   Specimen Description LEFT ANTECUBITAL  Final   Special Requests   Final    BOTTLES DRAWN AEROBIC AND ANAEROBIC Blood Culture adequate volume   Culture   Final  NO GROWTH < 24 HOURS Performed at [facility], [address], [place], [place] [address]    Report Status PENDING  Incomplete         Radiology Studies: DG Chest Port 1 View  Result Date: 07/27/2019 CLINICAL DATA:  Shortness of breath and wheezing EXAM: PORTABLE CHEST 1 VIEW COMPARISON:  06/24/2019, 02/09/2017 FINDINGS: Post sternotomy changes. Appendage clip. Trace pleural effusions. Enlarged cardiomediastinal silhouette with central vascular congestion. Increased hazy opacity at the right base. Aortic atherosclerosis. No pneumothorax. IMPRESSION: 1. Cardiomegaly with mild central congestion and trace pleural effusions. 2. Slight interval increase in hazy right basilar opacity, atelectasis versus mild pneumonia. Electronically Signed   By: Donavan Foil M.D.   On: 07/27/2019 15:44        Scheduled Meds: . apixaban  5 mg Oral BID  . atorvastatin  40 mg Oral q1800  . busPIRone  10 mg Oral BID  . diltiazem  240 mg Oral Daily  . DULoxetine  20 mg Oral Daily  . ferrous sulfate  325 mg Oral Q breakfast  . fluticasone furoate-vilanterol  1 puff Inhalation Daily  . ipratropium-albuterol  3 mL Nebulization Q6H  . methylPREDNISolone (SOLU-MEDROL) injection  60 mg Intravenous Q6H   Followed by  . [START ON 07/29/2019] predniSONE  40 mg Oral Q breakfast  . metoprolol succinate  25 mg Oral BID  . [START ON 07/29/2019] potassium chloride SA  40 mEq Oral Daily  .  torsemide  20 mg Oral BID   Continuous Infusions: . ceFEPime (MAXIPIME) IV 2 g (07/28/19 [id number])  . sodium chloride    . vancomycin       LOS: 1 day    Time spent: 30 minutes    Aolanis Crispen Darleen Crocker, DO [facility] Pager [phone]  If 7PM-7AM, please contact night-coverage [email or web address] Password [facility] 07/28/2019, 10:01 AM

## 2019-07-28 NOTE — Progress Notes (Signed)
CRITICAL VALUE ALERT  Critical Value:  Platelets 1924  Date & Time Notied:  07/28/2019 0740  Provider Notified: Dr. Manuella Ghazi  Orders Received/Actions taken: no new orders at this time

## 2019-07-28 NOTE — Progress Notes (Signed)
Initial Nutrition Assessment  DOCUMENTATION CODES:   Obesity unspecified  INTERVENTION:  Ensure Enlive po daily, each supplement provides 350 kcal and 20 grams of protein (chocolate)  MVI with minerals daily  Education  NUTRITION DIAGNOSIS:   Food and nutrition related knowledge deficit related to chronic illness(CHF) as evidenced by per patient/family report(dietary recall high in Na and saturated fats).  GOAL:   Other (Comment)(pt will adhere to nutrition recommendations)    MONITOR:   PO intake, Weight trends, Supplement acceptance, I & O's, Labs  REASON FOR ASSESSMENT:   Consult Assessment of nutrition requirement/status  ASSESSMENT:  RD working remotely.   84 year old female with past medical history significant of COPD, diastolic CHF, SOB, HLD, atrial fibrillation, CML with thrombocytosis, anxiety, and recently treated for bronchitis/bronchiectasis with 5 day course of doxycycline presented with 1 week history of worsening shortness of breath. CXR showed early pneumonia.  Patient admitted for dyspnea likely secondary to HCAP.  Per notes: -breathing improved, remains on 2 L [place] O2 -mild pulmonary vascular congestion -CML with thrombocytosis, levels improved this morning  Patient on HH/1500 ml diet order, no documented meals at this time for review. Spoke with patient via phone this afternoon, she reports currently eating lasagna for lunch, stated it was delicious. She does not like to cook when at home and reports eating out a lot. She likes to go to [employer], recalls fried shrimp and fried fish and frequents a [employer] as well as a hamburger joint in [place] where she enjoys their salads. RD educated on trying broiled fish verses fried, pt stated she loves fish, but can only have it fried. Patient agreeable to asking for lightly battered and no added salt when eating out.  Patient stated that she does not weigh herself daily, stated I know I am suppose to  but I forget. RD educated on the importance of daily weights and encouraged her to make a part of her morning routine. "Low Sodium Heart Healthy Diet" handout from [employer] attached to discharge instructions for patient review.  Patient endorses recent wt loss, states she was 175 lbs approximately 1 month ago due to decreased intake and fatigue secondary to being sick. She reports that her daughter purchased Ensure at that time, and she enjoyed the chocolate flavor.   Current wt 182.6 lbs Stable 182-188 lbs over the past 2 months. On 07/20/18 pt weight 92 kg (202.4 lbs) Patient has lost 19.8 lbs (9.8%) in one year which is insignificant for time frame.   Medications reviewed and include: Ferrous sulfate, Methylprednisolone, Klor-con, Demadex Maxipime Vancomycin Labs: CBGs 142-146, Cr 1.14 (H), WBC 16 (H), Hgb 8.2 (L), BNP 590 (H)   NUTRITION - FOCUSED PHYSICAL EXAM: Unable to complete at this time, RD working remotely.  Diet Order:   Diet Order            Diet Heart Room service appropriate? Yes; Fluid consistency: Thin; Fluid restriction: 1500 mL Fluid  Diet effective now              EDUCATION NEEDS:   Education needs have been addressed  Skin:  Skin Assessment: Reviewed RN Assessment  Last BM:  pta  Height:   Ht Readings from Last 1 Encounters:  07/27/19 5\' 3"  (1.6 m)    Weight:   Wt Readings from Last 1 Encounters:  07/27/19 83 kg    Ideal Body Weight:  52.3 kg  BMI:  Body mass index is 32.42 kg/m.  Estimated Nutritional Needs:   Kcal:  1550-1750  Protein:  78-88  Fluid:  1.5 L/day per MD   Lajuan Lines, RD, LDN Clinical Nutrition After Hours/Weekend Pager # in [place]

## 2019-07-29 LAB — CBC
HCT: 27.5 % — ABNORMAL LOW (ref 36.0–46.0)
Hemoglobin: 7.8 g/dL — ABNORMAL LOW (ref 12.0–15.0)
MCH: 25.6 pg — ABNORMAL LOW (ref 26.0–34.0)
MCHC: 28.4 g/dL — ABNORMAL LOW (ref 30.0–36.0)
MCV: 90.2 fL (ref 80.0–100.0)
Platelets: 2025 10*3/uL (ref 150–400)
RBC: 3.05 MIL/uL — ABNORMAL LOW (ref 3.87–5.11)
RDW: 17.4 % — ABNORMAL HIGH (ref 11.5–15.5)
WBC: 29.2 10*3/uL — ABNORMAL HIGH (ref 4.0–10.5)
nRBC: 0.1 % (ref 0.0–0.2)

## 2019-07-29 LAB — BASIC METABOLIC PANEL
Anion gap: 10 (ref 5–15)
BUN: 38 mg/dL — ABNORMAL HIGH (ref 8–23)
CO2: 22 mmol/L (ref 22–32)
Calcium: 8.7 mg/dL — ABNORMAL LOW (ref 8.9–10.3)
Chloride: 101 mmol/L (ref 98–111)
Creatinine, Ser: 1.47 mg/dL — ABNORMAL HIGH (ref 0.44–1.00)
GFR calc Af Amer: 38 mL/min — ABNORMAL LOW (ref >60)
GFR calc non Af Amer: 33 mL/min — ABNORMAL LOW (ref >60)
Glucose, Bld: 163 mg/dL — ABNORMAL HIGH (ref 70–99)
Potassium: 4.4 mmol/L (ref 3.5–5.1)
Sodium: 133 mmol/L — ABNORMAL LOW (ref 135–145)

## 2019-07-29 LAB — TROPONIN I (HIGH SENSITIVITY)
Troponin I (High Sensitivity): 68 ng/L — ABNORMAL HIGH (ref <18)
Troponin I (High Sensitivity): 70 ng/L — ABNORMAL HIGH (ref <18)

## 2019-07-29 LAB — PROCALCITONIN: Procalcitonin: <0.1 ng/mL

## 2019-07-29 LAB — MRSA PCR SCREENING: MRSA by PCR: NEGATIVE

## 2019-07-29 MED ORDER — DILTIAZEM HCL 25 MG/5ML IV SOLN
10.0000 mg | Freq: Once | INTRAVENOUS | Status: AC
  Administered 2019-07-29: 15:00:00 10 mg via INTRAVENOUS
  Filled 2019-07-29: qty 5

## 2019-07-29 MED ORDER — LEVALBUTEROL HCL 0.63 MG/3ML IN NEBU
0.6300 mg | INHALATION_SOLUTION | Freq: Four times a day (QID) | RESPIRATORY_TRACT | Status: DC | PRN
  Administered 2019-08-03: 0.63 mg via RESPIRATORY_TRACT
  Filled 2019-07-29: qty 3

## 2019-07-29 MED ORDER — ONDANSETRON HCL 4 MG/2ML IJ SOLN
4.0000 mg | Freq: Four times a day (QID) | INTRAMUSCULAR | Status: DC | PRN
  Administered 2019-07-29: 17:00:00 4 mg via INTRAVENOUS
  Filled 2019-07-29: qty 2

## 2019-07-29 MED ORDER — METOPROLOL TARTRATE 5 MG/5ML IV SOLN
5.0000 mg | Freq: Once | INTRAVENOUS | Status: AC
  Administered 2019-07-29: 17:00:00 5 mg via INTRAVENOUS
  Filled 2019-07-29: qty 5

## 2019-07-29 MED ORDER — ALPRAZOLAM 0.25 MG PO TABS
0.2500 mg | ORAL_TABLET | Freq: Three times a day (TID) | ORAL | Status: DC | PRN
  Administered 2019-07-29 – 2019-07-31 (×4): 0.25 mg via ORAL
  Filled 2019-07-29 (×4): qty 1

## 2019-07-29 NOTE — Progress Notes (Signed)
Patient's HR in 130's. MD has been notified and received order for Lopressor 5mg /84mL. Lopressor administered to patient. ECG heart rate now 98. Vital signs are as follows     07/29/19 1701  Vitals  Temp 97.9 F (36.6 C)  Temp Source Oral  BP 107/67  BP Location Left Arm  BP Method Automatic  Patient Position (if appropriate) Lying  Pulse Rate 68  Pulse Rate Source Dinamap  Resp 20  Oxygen Therapy  SpO2 94 %  O2 Device Room Air  MEWS Score  MEWS Temp 0  MEWS Systolic 0  MEWS Pulse 0  MEWS RR 0  MEWS LOC 0  MEWS Score 0  MEWS Score Color Green   12 Lead EKG has been ordered and completed. Will continue to monitor patient.

## 2019-07-29 NOTE — Progress Notes (Signed)
Nurse notified MD pt hurting under left breast with no radiating pain and burping. Orders obtained for EKG and troponins. Nurse will continue to supervise.

## 2019-07-29 NOTE — Progress Notes (Signed)
CRITICAL VALUE ALERT  Critical Value:  Platelets 2025  Date & Time Notied:  07/29/2019 [id number]  Provider Notified: DR. Manuella Ghazi  Orders Received/Actions taken: no new orders at this time

## 2019-07-29 NOTE — Progress Notes (Addendum)
PROGRESS NOTE    Nicole [name]  [id number] DOB: 1936-05-11 DOA: 07/27/2019 PCP: Frances Maywood, FNP   Brief Narrative:  Per HPI: Nicole [name] a 84 y.o.femalewith a history of COPD, diastolic heart failure, atrial fibrillation on anticoagulation, CML with thrombocytosis. She was recently admitted about a month ago for similar complaints. She DU with A. fib and RVR. She did complete doxycycline for total of 5 days for bronchitis/bronchiectasis. Today, she presents with 1 week of worsening shortness of breath which has been worsening and is now severe. Symptoms are worse with ambulation and improved with rest. No other palliating or provoking factors. Have mild cough. She had been prescribed doxycycline and prednisone by her oncology physician, although she has not been get these medications.  2/27: Patient was admitted with dyspnea likely secondary to developing HCAP.  She appears to be feeling better this morning and has been started on vancomycin and cefepime.  She was receiving a high rate of IV fluids which has been discontinued and she has been started back on her torsemide today.  We will continue to monitor strict I's and O's and wean oxygen as tolerated.  2/28: Patient appears to be feeling better this morning after further diuresis yesterday.  We will plan to wean oxygen levels and monitor blood pressures.  Ambulate by tomorrow if blood pressure is more stable and anticipate discharge with home oxygen if needed.  Continue current antibiotics and discontinue vancomycin as MRSA PCR negative.  Assessment & Plan:   Active Problems:   CAD (coronary artery disease)   Atrial fibrillation (HCC)   Chronic diastolic CHF (congestive heart failure) (HCC)   Aortic stenosis   Thrombocytosis (HCC)   COPD exacerbation (HCC)   CML (chronic myelocytic leukemia) ([place])   HCAP (healthcare-associated pneumonia)   Acute hypoxemic respiratory failure likely secondary to HCAP with some  mild pulmonary vascular congestion -Continue on cefepime with vancomycin discontinued since MRSA screen negative -Anticipate can de-escalate antibiotics fairly soon as procalcitonin is low, likely discharge on oral Augmentin at home -Breathing treatments as needed with incentive spirometry to help wean oxygen -Patient is normally on room air at home  Atrial fibrillation -Currently rate controlled -Hold metoprolol and Cardizem due to soft blood pressure readings and monitor heart rate and apixaban  COPD with mild exacerbation related to above -Continue on steroids and inhalers as prescribed with associated leukocytosis -Wean oxygen as tolerated and if not able may require home oxygen  CML with thrombocytosis -Levels improved this morning and will recheck CBC  Chronic diastolic heart failure -Mild pulmonary vascular congestion noted -Hold further torsemide today -Monitor daily weights   DVT prophylaxis: Apixaban Code Status: DNR Family Communication: We will call daughter Disposition Plan: Continue on cefepime only at this point and wean oxygen to room air as tolerated.  Monitor blood pressures and ambulate in a.m. with anticipated discharge at that point with oral Augmentin as well as prednisone. PT eval pending.   Consultants:   None  Procedures:   See below  Antimicrobials:  Anti-infectives (From admission, onward)   Start     Dose/Rate Route Frequency Ordered Stop   07/28/19 1800  vancomycin (VANCOREADY) IVPB 500 mg/100 mL  Status:  Discontinued     500 mg 100 mL/hr over 60 Minutes Intravenous Every 24 hours 07/27/19 1744 07/29/19 0945   07/27/19 1800  ceFEPIme (MAXIPIME) 2 g in sodium chloride 0.9 % 100 mL IVPB     2 g 200 mL/hr over 30 Minutes Intravenous Every 12  hours 07/27/19 1714     07/27/19 1800  vancomycin (VANCOREADY) IVPB 1500 mg/300 mL     1,500 mg 150 mL/hr over 120 Minutes Intravenous  Once 07/27/19 1714 07/27/19 2012   07/27/19 1700   vancomycin (VANCOCIN) IVPB 1000 mg/200 mL premix  Status:  Discontinued     1,000 mg 200 mL/hr over 60 Minutes Intravenous  Once 07/27/19 1658 07/27/19 1714   07/27/19 1700  ceFEPIme (MAXIPIME) 2 g in sodium chloride 0.9 % 100 mL IVPB  Status:  Discontinued     2 g 200 mL/hr over 30 Minutes Intravenous  Once 07/27/19 1658 07/27/19 1714       Subjective: Patient seen and evaluated today with no new acute complaints or concerns. No acute concerns or events noted overnight.  She states that overall she is feeling much better in terms of her breathing and is speaking in full sentences without any significant issues.  Objective: Vitals:   07/29/19 0552 07/29/19 0744 07/29/19 0801 07/29/19 0828  BP: 92/62  (!) 89/75   Pulse: 92  68   Resp: 18     Temp: 98 F (36.7 C)     TempSrc:      SpO2: 97%   94%  Weight: 85.9 kg 88.3 kg    Height:        Intake/Output Summary (Last 24 hours) at 07/29/2019 0946 Last data filed at 07/29/2019 0400 Gross per 24 hour  Intake 1240 ml  Output --  Net 1240 ml   Filed Weights   07/27/19 1506 07/29/19 0552 07/29/19 0744  Weight: 83 kg 85.9 kg 88.3 kg    Examination:  General exam: Appears calm and comfortable, obese Respiratory system: Clear to auscultation. Respiratory effort normal.  Currently on 2 L nasal cannula oxygen. Cardiovascular system: S1 & S2 heard, RRR. No JVD, murmurs, rubs, gallops or clicks. No pedal edema. Gastrointestinal system: Abdomen is nondistended, soft and nontender. No organomegaly or masses felt. Normal bowel sounds heard. Central nervous system: Alert and oriented. No focal neurological deficits. Extremities: Symmetric 5 x 5 power. Skin: No rashes, lesions or ulcers Psychiatry: Judgement and insight appear normal. Mood & affect appropriate.     Data Reviewed: I have personally reviewed following labs and imaging studies  CBC: Recent Labs  Lab 07/24/19 1056 07/27/19 1529 07/28/19 0710 07/29/19 0629  WBC 13.6*  18.3* 16.0* 29.2*  NEUTROABS 10.1* 13.6*  --   --   HGB 8.7* 9.0* 8.2* 7.8*  HCT 29.2* 30.7* 28.2* 27.5*  MCV 91.0 90.0 91.0 90.2  PLT 1,770* 2,257* 1,924* 0000000*   Basic Metabolic Panel: Recent Labs  Lab 07/24/19 1056 07/27/19 1529 07/28/19 0710 07/29/19 0629  NA 140 138 138 133*  K 4.1 3.8 4.4 4.4  CL 102 100 104 101  CO2 28 27 24 22   GLUCOSE 129* 146* 193* 163*  BUN 18 21 21  38*  CREATININE 1.20* 1.20* 1.14* 1.47*  CALCIUM 8.9 9.0 8.7* 8.7*  MG 2.5*  --   --   --    GFR: Estimated Creatinine Clearance: 31.1 mL/min (A) (by C-G formula based on SCr of 1.47 mg/dL (H)). Liver Function Tests: Recent Labs  Lab 07/24/19 1056 07/27/19 1529  AST 27 28  ALT 31 27  ALKPHOS 65 65  BILITOT 0.9 1.0  PROT 6.4* 6.6  ALBUMIN 3.8 3.8   No results for input(s): LIPASE, AMYLASE in the last 168 hours. No results for input(s): AMMONIA in the last 168 hours. Coagulation Profile: Recent Labs  Lab 07/27/19 1529  INR 1.9*   Cardiac Enzymes: No results for input(s): CKTOTAL, CKMB, CKMBINDEX, TROPONINI in the last 168 hours. BNP (last 3 results) No results for input(s): PROBNP in the last 8760 hours. HbA1C: No results for input(s): HGBA1C in the last 72 hours. CBG: No results for input(s): GLUCAP in the last 168 hours. Lipid Profile: No results for input(s): CHOL, HDL, LDLCALC, TRIG, CHOLHDL, LDLDIRECT in the last 72 hours. Thyroid Function Tests: No results for input(s): TSH, T4TOTAL, FREET4, T3FREE, THYROIDAB in the last 72 hours. Anemia Panel: No results for input(s): VITAMINB12, FOLATE, FERRITIN, TIBC, IRON, RETICCTPCT in the last 72 hours. Sepsis Labs: Recent Labs  Lab 07/27/19 1709 07/27/19 1716 07/27/19 1925 07/28/19 0710 07/29/19 0629  PROCALCITON  --  <0.10  --  <0.10 <0.10  LATICACIDVEN 2.2*  --  1.3  --   --     Recent Results (from the past 240 hour(s))  Respiratory Panel by RT PCR (Flu A&B, Covid) - Nasopharyngeal Swab     Status: None   Collection Time:  07/27/19  3:45 PM   Specimen: Nasopharyngeal Swab  Result Value Ref Range Status   SARS Coronavirus 2 by RT PCR NEGATIVE NEGATIVE Final    Comment: (NOTE) SARS-CoV-2 target nucleic acids are NOT DETECTED. The SARS-CoV-2 RNA is generally detectable in upper respiratoy specimens during the acute phase of infection. The lowest concentration of SARS-CoV-2 viral copies this assay can detect is 131 copies/mL. A negative result does not preclude SARS-Cov-2 infection and should not be used as the sole basis for treatment or other patient management decisions. A negative result may occur with  improper specimen collection/handling, submission of specimen other than nasopharyngeal swab, presence of viral mutation(s) within the areas targeted by this assay, and inadequate number of viral copies (<131 copies/mL). A negative result must be combined with clinical observations, patient history, and epidemiological information. The expected result is Negative. Fact Sheet for Patients:  PinkCheek.be Fact Sheet for Healthcare Providers:  GravelBags.it This test is not yet ap proved or cleared by the [place] FDA and  has been authorized for detection and/or diagnosis of SARS-CoV-2 by FDA under an Emergency Use Authorization (EUA). This EUA will remain  in effect (meaning this test can be used) for the duration of the COVID-19 declaration under Section 564(b)(1) of the Act, 21 U.S.C. section 360bbb-3(b)(1), unless the authorization is terminated or revoked sooner.    Influenza A by PCR NEGATIVE NEGATIVE Final   Influenza B by PCR NEGATIVE NEGATIVE Final    Comment: (NOTE) The Xpert Xpress SARS-CoV-2/FLU/RSV assay is intended as an aid in  the diagnosis of influenza from Nasopharyngeal swab specimens and  should not be used as a sole basis for treatment. Nasal washings and  aspirates are unacceptable for Xpert Xpress SARS-CoV-2/FLU/RSV    testing. Fact Sheet for Patients: PinkCheek.be Fact Sheet for Healthcare Providers: GravelBags.it This test is not yet approved or cleared by the [place] FDA and  has been authorized for detection and/or diagnosis of SARS-CoV-2 by  FDA under an Emergency Use Authorization (EUA). This EUA will remain  in effect (meaning this test can be used) for the duration of the  Covid-19 declaration under Section 564(b)(1) of the Act, 21  U.S.C. section 360bbb-3(b)(1), unless the authorization is  terminated or revoked. Performed at [facility], [address], [place], [place] [address]   Blood Culture (routine x 2)     Status: None (Preliminary result)   Collection Time: 07/27/19  5:20 PM   Specimen: BLOOD RIGHT HAND  Result Value Ref Range Status   Specimen Description BLOOD RIGHT HAND  Final   Special Requests   Final    BOTTLES DRAWN AEROBIC AND ANAEROBIC Blood Culture adequate volume   Culture   Final    NO GROWTH < 24 HOURS Performed at [facility], [address], [place], [place] [address]    Report Status PENDING  Incomplete  Blood Culture (routine x 2)     Status: None (Preliminary result)   Collection Time: 07/27/19  5:20 PM   Specimen: Left Antecubital; Blood  Result Value Ref Range Status   Specimen Description LEFT ANTECUBITAL  Final   Special Requests   Final    BOTTLES DRAWN AEROBIC AND ANAEROBIC Blood Culture adequate volume   Culture   Final    NO GROWTH < 24 HOURS Performed at [facility], [address], [place], [place] [address]    Report Status PENDING  Incomplete  Urine culture     Status: None (Preliminary result)   Collection Time: 07/27/19  6:35 PM   Specimen: In/Out Cath Urine  Result Value Ref Range Status   Specimen Description   Final    IN/OUT CATH URINE Performed at [facility], [address] [place], [place] [address]    Special Requests   Final    NONE Performed at [facility], [address] [place], [place] [address]    Culture   Final    CULTURE REINCUBATED FOR BETTER GROWTH Performed at [facility], [place] [address] [place], [place] [address]    Report Status PENDING  Incomplete  MRSA PCR Screening     Status: None   Collection Time: 07/29/19  6:03 AM   Specimen: Nasal Mucosa; Nasopharyngeal  Result Value Ref Range Status   MRSA by PCR NEGATIVE NEGATIVE Final    Comment:        The GeneXpert MRSA Assay (FDA approved for NASAL specimens only), is one component of a comprehensive MRSA colonization surveillance program. It is not intended to diagnose MRSA infection nor to guide or monitor treatment for MRSA infections. Performed at [facility], [address] [place], [place] [address]          Radiology Studies: DG Chest 1 View  Result Date: 07/28/2019 CLINICAL DATA:  Dyspnea. Large epigastric hernia. EXAM: CHEST  1 VIEW COMPARISON:  07/27/2019 FINDINGS: Signs of median sternotomy and aortic valve replacement with left atrial clipping. Marked cardiomegaly. Low lung volumes. Blunting of right costodiaphragmatic sulcus. Left and right hemidiaphragm are obscured. Study limited by patient body habitus. No acute bone finding. IMPRESSION: Low lung volumes with bibasilar atelectasis and/or infiltrates. Marked cardiomegaly without signs of failure but with potential pulmonary vascular congestion. Signs of aortic valve replacement and left atrial clipping. Electronically Signed   By: Zetta Bills M.D.   On: 07/28/2019 18:02   DG Chest Port 1 View  Result Date: 07/27/2019 CLINICAL DATA:  Shortness of breath and wheezing EXAM: PORTABLE CHEST 1 VIEW COMPARISON:  06/24/2019, 02/09/2017 FINDINGS: Post sternotomy changes. Appendage clip. Trace pleural effusions. Enlarged cardiomediastinal silhouette with central vascular congestion. Increased hazy opacity at the right base. Aortic atherosclerosis. No pneumothorax. IMPRESSION: 1. Cardiomegaly with mild  central congestion and trace pleural effusions. 2. Slight interval increase in hazy right basilar opacity, atelectasis versus mild pneumonia. Electronically Signed   By: Donavan Foil M.D.   On: 07/27/2019 15:44        Scheduled  Meds: . apixaban  5 mg Oral BID  . atorvastatin  40 mg Oral q1800  . busPIRone  10 mg Oral BID  . diltiazem  240 mg Oral Daily  . DULoxetine  20 mg Oral Daily  . feeding supplement (ENSURE ENLIVE)  237 mL Oral Q24H  . ferrous sulfate  325 mg Oral Q breakfast  . fluticasone furoate-vilanterol  1 puff Inhalation Daily  . ipratropium-albuterol  3 mL Nebulization Q6H  . metoprolol succinate  25 mg Oral BID  . multivitamin with minerals  1 tablet Oral Daily  . potassium chloride SA  40 mEq Oral Daily  . predniSONE  40 mg Oral Q breakfast   Continuous Infusions: . ceFEPime (MAXIPIME) IV 2 g (07/29/19 0556)  . sodium chloride       LOS: 2 days    Time spent: 30 minutes    Tidus Upchurch Darleen Crocker, DO [facility] Pager [phone]  If 7PM-7AM, please contact night-coverage [email or web address] Password [facility] 07/29/2019, 9:46 AM

## 2019-07-29 NOTE — Progress Notes (Addendum)
MEWS Guidelines - (patients age 82 and over) Patient mews score is a 4 due to respiratory rate being 32 and heart rate 124. MD paged. Swat nurse and primary nurse assessed.MD changed breathing treatments  Will reposition  patient and reassess. Rechecked vital signs and documented in chart. Will continue to assess throughout shift.   Red - At High Risk for Deterioration Yellow - At risk for Deterioration  1. Go to room and assess patient 2. Validate data. Is this patient's baseline? If data confirmed: 3. Is this an acute change? 4. Administer prn meds/treatments as ordered. 5. Note Sepsis score 6. Review goals of care 7. Sports coach, RRT nurse and Provider. 8. Ask Provider to come to bedside.  9. Document patient condition/interventions/response. 10. Increase frequency of vital signs and focused assessments to at least q15 minutes x 4, then q30 minutes x2. - If stable, then q1h x3, then q4h x3 and then q8h or dept. routine. - If unstable, contact Provider & RRT nurse. Prepare for possible transfer. 11. Add entry in progress notes using the smart phrase ".MEWS". 1. Go to room and assess patient 2. Validate data. Is this patient's baseline? If data confirmed: 3. Is this an acute change? 4. Administer prn meds/treatments as ordered? 5. Note Sepsis score 6. Review goals of care 7. Sports coach and Provider 8. Call RRT nurse as needed. 9. Document patient condition/interventions/response. 10. Increase frequency of vital signs and focused assessments to at least q2h x2. - If stable, then q4h x2 and then q8h or dept. routine. - If unstable, contact Provider & RRT nurse. Prepare for possible transfer. 11. Add entry in progress notes using the smart phrase ".MEWS".  Green - Likely stable Lavender - Comfort Care Only  1. Continue routine/ordered monitoring.  2. Review goals of care. 1. Continue routine/ordered monitoring. 2. Review goals of care.

## 2019-07-30 DIAGNOSIS — R0602 Shortness of breath: Secondary | ICD-10-CM

## 2019-07-30 LAB — T4, FREE: Free T4: 1.25 ng/dL — ABNORMAL HIGH (ref 0.61–1.12)

## 2019-07-30 LAB — CBC
HCT: 26.8 % — ABNORMAL LOW (ref 36.0–46.0)
Hemoglobin: 7.7 g/dL — ABNORMAL LOW (ref 12.0–15.0)
MCH: 25.5 pg — ABNORMAL LOW (ref 26.0–34.0)
MCHC: 28.7 g/dL — ABNORMAL LOW (ref 30.0–36.0)
MCV: 88.7 fL (ref 80.0–100.0)
Platelets: 2077 10*3/uL (ref 150–400)
RBC: 3.02 MIL/uL — ABNORMAL LOW (ref 3.87–5.11)
RDW: 17.7 % — ABNORMAL HIGH (ref 11.5–15.5)
WBC: 23 10*3/uL — ABNORMAL HIGH (ref 4.0–10.5)
nRBC: 0.1 % (ref 0.0–0.2)

## 2019-07-30 LAB — BASIC METABOLIC PANEL
Anion gap: 9 (ref 5–15)
BUN: 54 mg/dL — ABNORMAL HIGH (ref 8–23)
CO2: 25 mmol/L (ref 22–32)
Calcium: 9 mg/dL (ref 8.9–10.3)
Chloride: 101 mmol/L (ref 98–111)
Creatinine, Ser: 1.66 mg/dL — ABNORMAL HIGH (ref 0.44–1.00)
GFR calc Af Amer: 33 mL/min — ABNORMAL LOW (ref >60)
GFR calc non Af Amer: 28 mL/min — ABNORMAL LOW (ref >60)
Glucose, Bld: 130 mg/dL — ABNORMAL HIGH (ref 70–99)
Potassium: 4.5 mmol/L (ref 3.5–5.1)
Sodium: 135 mmol/L (ref 135–145)

## 2019-07-30 LAB — URINE CULTURE: Culture: 50000 — AB

## 2019-07-30 LAB — BRAIN NATRIURETIC PEPTIDE: B Natriuretic Peptide: 822 pg/mL — ABNORMAL HIGH (ref 0.0–100.0)

## 2019-07-30 LAB — TSH: TSH: 0.165 u[IU]/mL — ABNORMAL LOW (ref 0.350–4.500)

## 2019-07-30 MED ORDER — DILTIAZEM HCL 60 MG PO TABS
60.0000 mg | ORAL_TABLET | Freq: Four times a day (QID) | ORAL | Status: DC
  Administered 2019-07-30 – 2019-08-06 (×25): 60 mg via ORAL
  Filled 2019-07-30 (×27): qty 1

## 2019-07-30 MED ORDER — SODIUM CHLORIDE 0.9 % IV SOLN
2.0000 g | INTRAVENOUS | Status: AC
  Administered 2019-07-31: 2 g via INTRAVENOUS
  Filled 2019-07-30: qty 2

## 2019-07-30 MED ORDER — METOPROLOL SUCCINATE ER 25 MG PO TB24
25.0000 mg | ORAL_TABLET | Freq: Every day | ORAL | Status: DC
  Administered 2019-07-31 – 2019-08-09 (×9): 25 mg via ORAL
  Filled 2019-07-30 (×10): qty 1

## 2019-07-30 MED ORDER — METOPROLOL TARTRATE 5 MG/5ML IV SOLN
5.0000 mg | Freq: Once | INTRAVENOUS | Status: AC
  Administered 2019-07-30: 5 mg via INTRAVENOUS
  Filled 2019-07-30: qty 5

## 2019-07-30 MED ORDER — APIXABAN 2.5 MG PO TABS
2.5000 mg | ORAL_TABLET | Freq: Two times a day (BID) | ORAL | Status: DC
  Administered 2019-07-30 (×2): 2.5 mg via ORAL
  Filled 2019-07-30 (×3): qty 1

## 2019-07-30 MED ORDER — TORSEMIDE 20 MG PO TABS
20.0000 mg | ORAL_TABLET | Freq: Two times a day (BID) | ORAL | Status: DC
  Filled 2019-07-30: qty 1

## 2019-07-30 NOTE — Progress Notes (Signed)
MEWS Guidelines - (patients age 82 and over) Mew score is yellow. MD notified. Sepsis score is a 2. No new orders at this time.  Red - At High Risk for Deterioration Yellow - At risk for Deterioration  1. Go to room and assess patient 2. Validate data. Is this patient's baseline? If data confirmed: 3. Is this an acute change? 4. Administer prn meds/treatments as ordered. 5. Note Sepsis score 6. Review goals of care 7. Sports coach, RRT nurse and Provider. 8. Ask Provider to come to bedside.  9. Document patient condition/interventions/response. 10. Increase frequency of vital signs and focused assessments to at least q15 minutes x 4, then q30 minutes x2. - If stable, then q1h x3, then q4h x3 and then q8h or dept. routine. - If unstable, contact Provider & RRT nurse. Prepare for possible transfer. 11. Add entry in progress notes using the smart phrase ".MEWS". 1. Go to room and assess patient 2. Validate data. Is this patient's baseline? If data confirmed: 3. Is this an acute change? 4. Administer prn meds/treatments as ordered? 5. Note Sepsis score 6. Review goals of care 7. Sports coach and Provider 8. Call RRT nurse as needed. 9. Document patient condition/interventions/response. 10. Increase frequency of vital signs and focused assessments to at least q2h x2. - If stable, then q4h x2 and then q8h or dept. routine. - If unstable, contact Provider & RRT nurse. Prepare for possible transfer. 11. Add entry in progress notes using the smart phrase ".MEWS".  Green - Likely stable [place] - Comfort Care Only  1. Continue routine/ordered monitoring.  2. Review goals of care. 1. Continue routine/ordered monitoring. 2. Review goals of care.

## 2019-07-30 NOTE — TOC Progression Note (Signed)
Transition of Care [facility]) - Progression Note    Patient Details  Name: [name] MRN: [id number] Date of Birth: [birth year]  Transition of Care [facility]) CM/SW Contact  Boneta Lucks, RN Phone Number: 07/30/2019, 3:11 PM  Clinical Narrative:   [facility] has made a bed offer. COVID test has to be 48 hours before admission.  They use the time collected. MD updated.     Expected Discharge Plan: Skilled Nursing Facility Barriers to Discharge: Continued Medical Work up  Expected Discharge Plan and Services Expected Discharge Plan: [place]       Living arrangements for the past 2 months: Apartment

## 2019-07-30 NOTE — TOC Initial Note (Signed)
Transition of Care [facility]) - Initial/Assessment Note    Patient Details  Name: Nicole [name] MRN: [id number] Date of Birth: 05-07-1936  Transition of Care [facility]) CM/SW Contact:    Boneta Lucks, RN Phone Number: 07/30/2019, 11:39 AM  Clinical Narrative:   Patient admitted with Pneumonia, Lives at home alone in an apartment. Per daughter [name] in January was still driving. PT is recommending SNF, patient and daughter agrees and is requesting [facility] in [place]. FL2 done and faxed Clinical to admission.  TOC to follow for bed offer.                 Expected Discharge Plan: Skilled Nursing Facility Barriers to Discharge: Continued Medical Work up   Patient Goals and CMS Choice Patient states their goals for this hospitalization and ongoing recovery are:: to go to SNF, then return to apartment. CMS Medicare.gov Compare Post Acute Care list provided to:: Patient Choice offered to / list presented to : Patient  Expected Discharge Plan and Services Expected Discharge Plan: [place]       Living arrangements for the past 2 months: Apartment                     Prior Living Arrangements/Services Living arrangements for the past 2 months: Apartment Lives with:: Self Patient language and need for interpreter reviewed:: Yes Do you feel safe going back to the place where you live?: Yes      Need for Family Participation in Patient Care: Yes (Comment) Care giver support system in place?: Yes (comment)   Criminal Activity/Legal Involvement Pertinent to Current Situation/Hospitalization: No - Comment as needed  Activities of Daily Living Home Assistive Devices/Equipment: Cane (specify quad or straight), Walker (specify type) ADL Screening (condition at time of admission) Patient's cognitive ability adequate to safely complete daily activities?: Yes Is the patient deaf or have difficulty hearing?: No Does the patient have difficulty seeing, even when wearing  glasses/contacts?: No Does the patient have difficulty concentrating, remembering, or making decisions?: No Patient able to express need for assistance with ADLs?: Yes Does the patient have difficulty dressing or bathing?: No Independently performs ADLs?: Yes (appropriate for developmental age) Does the patient have difficulty walking or climbing stairs?: No Weakness of Legs: None Weakness of Arms/Hands: None  Permission Sought/Granted   Permission granted to share information with : Yes, Verbal Permission Granted  Share Information with NAME: [name]  Permission granted to share info w AGENCY: [facility] planner granted to share info w Relationship: Daughter     Emotional Assessment     Affect (typically observed): Accepting, Appropriate Orientation: : Oriented to Self, Oriented to Place, Oriented to  Time, Oriented to Situation Alcohol / Substance Use: Not Applicable Psych Involvement: No (comment)  Admission diagnosis:  Acute respiratory failure with hypoxia ([place]) [J96.01] HCAP (healthcare-associated pneumonia) [J18.9] Sepsis, due to unspecified organism, unspecified whether acute organ dysfunction present [facility]) [A41.9] Patient Active Problem List   Diagnosis Date Noted  . HCAP (healthcare-associated pneumonia) 07/27/2019  . CML (chronic myelocytic leukemia) ([place]) 07/09/2019  . Acute on chronic congestive heart failure ([place])   . COPD exacerbation ([place])   . Thyroid enlarged   . Class 2 obesity due to excess calories with body mass index (BMI) of 36.0 to 36.9 in adult   . SOB (shortness of breath) 06/25/2019  . Dyspnea 06/24/2019  . Thrombocytosis ([place]) 06/11/2019  . Chronic back pain 06/24/2017  . Symptomatic anemia 06/23/2017  . Hyperthyroidism 08/05/2015  .  Chronic diastolic CHF (congestive heart failure) ([place]) 02/28/2013  . Aortic stenosis 02/28/2013  . CAD (coronary artery disease) 02/11/2013  . Acute on chronic diastolic HF (heart failure) ([place]) 02/11/2013  .  Atrial fibrillation ([place]) 02/11/2013   PCP:  Frances Maywood, FNP Pharmacy:   [facility] [phone] [place], [address] [place] [address] Phone: [phone] Fax: [phone] [place], [place] [address] [place] [address] [place] [address] [place] [address] Phone: [phone] Fax: [phone]  [place], [place] [address] Phone: [phone] Fax: [phone]

## 2019-07-30 NOTE — Progress Notes (Signed)
PROGRESS NOTE    Nicole [name]  [id number] DOB: Jun 29, 1935 DOA: 07/27/2019 PCP: Frances Maywood, FNP   Brief Narrative:  Per HPI: Nicole [name] a 84 y.o.femalewith a history of COPD, diastolic heart failure, atrial fibrillation on anticoagulation, CML with thrombocytosis. She was recently admitted about a month ago for similar complaints. She DU with A. fib and RVR. She did complete doxycycline for total of 5 days for bronchitis/bronchiectasis. Today, she presents with 1 week of worsening shortness of breath which has been worsening and is now severe. Symptoms are worse with ambulation and improved with rest. No other palliating or provoking factors. Have mild cough. She had been prescribed doxycycline and prednisone by her oncology physician, although she has not been get these medications.  2/27:Patient was admitted with dyspnea likely secondary to developing HCAP. She appears to be feeling better this morning and has been started on vancomycin and cefepime. She was receiving a high rate of IV fluids which has been discontinued and she has been started back on her torsemide today. We will continue to monitor strict I's and O's and wean oxygen as tolerated.  2/28: Patient appears to be feeling better this morning after further diuresis yesterday.  We will plan to wean oxygen levels and monitor blood pressures.  Ambulate by tomorrow if blood pressure is more stable and anticipate discharge with home oxygen if needed.  Continue current antibiotics and discontinue vancomycin as MRSA PCR negative.  3/1: Appreciate PCCM evaluation.  Plan to continue IV antibiotics for 1 more day.  Administered oral torsemide this morning and will hold further diuretics this afternoon given soft blood pressure readings and worsening renal function.  Toprol-XL to also be held this evening.  Continue monitor heart rates and blood pressures.  Monitor a.m. labs.  PT evaluation recommending SNF which patient  is agreeable to.  Assessment & Plan:   Active Problems:   CAD (coronary artery disease)   Atrial fibrillation (HCC)   Chronic diastolic CHF (congestive heart failure) (HCC)   Aortic stenosis   Thrombocytosis (HCC)   COPD exacerbation (HCC)   CML (chronic myelocytic leukemia) ([place])   HCAP (healthcare-associated pneumonia)   Acute hypoxemic respiratory failure likely secondary to HCAP with some mild pulmonary vascular congestion with acute on chronic diastolic heart failure -Continue on cefepime with vancomycin discontinued since MRSA screen negative -Anticipate discontinuing cefepime by tomorrow as it will be day 5 -Breathing treatments as needed with incentive spirometry to help wean oxygen -Patient is normally on room air at home -Hold further IV diuresis today and monitor daily weights  Atrial fibrillation -Currently rate controlled -Continue on Cardizem as prescribed as well as metoprolol XL, but once daily and monitor blood pressures -Blood pressures have been soft and may be related to her metoprolol dosing -May need to consider Bystolic for better cardio selectivity in near future  AKI-likely prerenal -Related to diuresis as well as hypotension yesterday -Given torsemide this morning and hold afternoon dose and check urine electrolytes -She still appears volume overloaded on clinical exam with BNP elevation noted -No further IV pushes of diuretics for now -Recheck a.m. labs -Monitor urine output  COPD with mild exacerbation related to above-improving -Steroids held on 2/28 and albuterol scheduled switch to Xopenex as needed  CML with thrombocytosis -Followed by Dr. Delton Coombes outpatient -Hemoglobin 7.7 and will consider transfusion for hemoglobin less than 7  Goiter -T4 level slightly elevated previously, will recheck all thyroid levels -Endocrinology follow-up outpatient   DVT prophylaxis:Apixaban Code Status:DNR Family  Communication:Discussed  extensively with daughter on phone Disposition Plan:Continue on cefepime for 1 more day.  Thyroid studies ordered for goiter evaluation.  Urine studies for AKI ordered and pending.  Hold metoprolol dose tonight and reassess heart rate and blood pressure readings tomorrow.   Consultants:  PCCM  Procedures:  See below  Antimicrobials:  Anti-infectives (From admission, onward)   Start     Dose/Rate Route Frequency Ordered Stop   07/31/19 0600  ceFEPIme (MAXIPIME) 2 g in sodium chloride 0.9 % 100 mL IVPB     2 g 200 mL/hr over 30 Minutes Intravenous Every 24 hours 07/30/19 1144     07/28/19 1800  vancomycin (VANCOREADY) IVPB 500 mg/100 mL  Status:  Discontinued     500 mg 100 mL/hr over 60 Minutes Intravenous Every 24 hours 07/27/19 1744 07/29/19 0945   07/27/19 1800  ceFEPIme (MAXIPIME) 2 g in sodium chloride 0.9 % 100 mL IVPB  Status:  Discontinued     2 g 200 mL/hr over 30 Minutes Intravenous Every 12 hours 07/27/19 1714 07/30/19 1144   07/27/19 1800  vancomycin (VANCOREADY) IVPB 1500 mg/300 mL     1,500 mg 150 mL/hr over 120 Minutes Intravenous  Once 07/27/19 1714 07/27/19 2012   07/27/19 1700  vancomycin (VANCOCIN) IVPB 1000 mg/200 mL premix  Status:  Discontinued     1,000 mg 200 mL/hr over 60 Minutes Intravenous  Once 07/27/19 1658 07/27/19 1714   07/27/19 1700  ceFEPIme (MAXIPIME) 2 g in sodium chloride 0.9 % 100 mL IVPB  Status:  Discontinued     2 g 200 mL/hr over 30 Minutes Intravenous  Once 07/27/19 1658 07/27/19 1714       Subjective: Patient seen and evaluated today with no new acute complaints or concerns. No acute concerns or events noted overnight.  She still appears tachypneic this morning.  Heart rates have been controlled.  Objective: Vitals:   07/30/19 0501 07/30/19 0659 07/30/19 0805 07/30/19 0900  BP: (!) 90/53 (!) 101/50  (!) 82/57  Pulse: (!) 104 88  78  Resp: 18 18    Temp: 98.2 F (36.8 C) 98 F (36.7 C)  97.9 F (36.6 C)  TempSrc: Oral  Oral  Oral  SpO2: 95% 95% 95% 94%  Weight:      Height:        Intake/Output Summary (Last 24 hours) at 07/30/2019 1303 Last data filed at 07/29/2019 1352 Gross per 24 hour  Intake 200 ml  Output 400 ml  Net -200 ml   Filed Weights   07/27/19 1506 07/29/19 0552 07/29/19 0744  Weight: 83 kg 85.9 kg 88.3 kg    Examination:  General exam: Appears calm and comfortable  Respiratory system: Clear to auscultation. Respiratory effort normal.  Currently on 2 L nasal cannula oxygen. Cardiovascular system: S1 & S2 heard, irregular and mildly tachycardic. No JVD, murmurs, rubs, gallops or clicks.  Scant pedal edema noticed Gastrointestinal system: Abdomen is nondistended, soft and nontender. No organomegaly or masses felt. Normal bowel sounds heard. Central nervous system: Alert and oriented. No focal neurological deficits. Extremities: Symmetric 5 x 5 power.  Scant pedal edema. Skin: No rashes, lesions or ulcers Psychiatry: Judgement and insight appear normal. Mood & affect appropriate.     Data Reviewed: I have personally reviewed following labs and imaging studies  CBC: Recent Labs  Lab 07/24/19 1056 07/27/19 1529 07/28/19 0710 07/29/19 0629 07/30/19 0454  WBC 13.6* 18.3* 16.0* 29.2* 23.0*  NEUTROABS 10.1* 13.6*  --   --   --  HGB 8.7* 9.0* 8.2* 7.8* 7.7*  HCT 29.2* 30.7* 28.2* 27.5* 26.8*  MCV 91.0 90.0 91.0 90.2 88.7  PLT 1,770* 2,257* 1,924* 2,025* [id number]*   Basic Metabolic Panel: Recent Labs  Lab 07/24/19 1056 07/27/19 1529 07/28/19 0710 07/29/19 0629 07/30/19 0454  NA 140 138 138 133* 135  K 4.1 3.8 4.4 4.4 4.5  CL 102 100 104 101 101  CO2 28 27 24 22 25   GLUCOSE 129* 146* 193* 163* 130*  BUN 18 21 21  38* 54*  CREATININE 1.20* 1.20* 1.14* 1.47* 1.66*  CALCIUM 8.9 9.0 8.7* 8.7* 9.0  MG 2.5*  --   --   --   --    GFR: Estimated Creatinine Clearance: 27.6 mL/min (A) (by C-G formula based on SCr of 1.66 mg/dL (H)). Liver Function Tests: Recent Labs  Lab  07/24/19 1056 07/27/19 1529  AST 27 28  ALT 31 27  ALKPHOS 65 65  BILITOT 0.9 1.0  PROT 6.4* 6.6  ALBUMIN 3.8 3.8   No results for input(s): LIPASE, AMYLASE in the last 168 hours. No results for input(s): AMMONIA in the last 168 hours. Coagulation Profile: Recent Labs  Lab 07/27/19 1529  INR 1.9*   Cardiac Enzymes: No results for input(s): CKTOTAL, CKMB, CKMBINDEX, TROPONINI in the last 168 hours. BNP (last 3 results) No results for input(s): PROBNP in the last 8760 hours. HbA1C: No results for input(s): HGBA1C in the last 72 hours. CBG: No results for input(s): GLUCAP in the last 168 hours. Lipid Profile: No results for input(s): CHOL, HDL, LDLCALC, TRIG, CHOLHDL, LDLDIRECT in the last 72 hours. Thyroid Function Tests: No results for input(s): TSH, T4TOTAL, FREET4, T3FREE, THYROIDAB in the last 72 hours. Anemia Panel: No results for input(s): VITAMINB12, FOLATE, FERRITIN, TIBC, IRON, RETICCTPCT in the last 72 hours. Sepsis Labs: Recent Labs  Lab 07/27/19 1709 07/27/19 1716 07/27/19 1925 07/28/19 0710 07/29/19 0629  PROCALCITON  --  <0.10  --  <0.10 <0.10  LATICACIDVEN 2.2*  --  1.3  --   --     Recent Results (from the past 240 hour(s))  Respiratory Panel by RT PCR (Flu A&B, Covid) - Nasopharyngeal Swab     Status: None   Collection Time: 07/27/19  3:45 PM   Specimen: Nasopharyngeal Swab  Result Value Ref Range Status   SARS Coronavirus 2 by RT PCR NEGATIVE NEGATIVE Final    Comment: (NOTE) SARS-CoV-2 target nucleic acids are NOT DETECTED. The SARS-CoV-2 RNA is generally detectable in upper respiratoy specimens during the acute phase of infection. The lowest concentration of SARS-CoV-2 viral copies this assay can detect is 131 copies/mL. A negative result does not preclude SARS-Cov-2 infection and should not be used as the sole basis for treatment or other patient management decisions. A negative result may occur with  improper specimen collection/handling,  submission of specimen other than nasopharyngeal swab, presence of viral mutation(s) within the areas targeted by this assay, and inadequate number of viral copies (<131 copies/mL). A negative result must be combined with clinical observations, patient history, and epidemiological information. The expected result is Negative. Fact Sheet for Patients:  PinkCheek.be Fact Sheet for Healthcare Providers:  GravelBags.it This test is not yet ap proved or cleared by the [place] FDA and  has been authorized for detection and/or diagnosis of SARS-CoV-2 by FDA under an Emergency Use Authorization (EUA). This EUA will remain  in effect (meaning this test can be used) for the duration of the COVID-19 declaration under Section 564(b)(1) of  the Act, 21 U.S.C. section 360bbb-3(b)(1), unless the authorization is terminated or revoked sooner.    Influenza A by PCR NEGATIVE NEGATIVE Final   Influenza B by PCR NEGATIVE NEGATIVE Final    Comment: (NOTE) The Xpert Xpress SARS-CoV-2/FLU/RSV assay is intended as an aid in  the diagnosis of influenza from Nasopharyngeal swab specimens and  should not be used as a sole basis for treatment. Nasal washings and  aspirates are unacceptable for Xpert Xpress SARS-CoV-2/FLU/RSV  testing. Fact Sheet for Patients: PinkCheek.be Fact Sheet for Healthcare Providers: GravelBags.it This test is not yet approved or cleared by the [place] FDA and  has been authorized for detection and/or diagnosis of SARS-CoV-2 by  FDA under an Emergency Use Authorization (EUA). This EUA will remain  in effect (meaning this test can be used) for the duration of the  Covid-19 declaration under Section 564(b)(1) of the Act, 21  U.S.C. section 360bbb-3(b)(1), unless the authorization is  terminated or revoked. Performed at [facility], [address]  [place], [place] [address]   Blood Culture (routine x 2)     Status: None (Preliminary result)   Collection Time: 07/27/19  5:20 PM   Specimen: BLOOD RIGHT HAND  Result Value Ref Range Status   Specimen Description BLOOD RIGHT HAND  Final   Special Requests   Final    BOTTLES DRAWN AEROBIC AND ANAEROBIC Blood Culture adequate volume   Culture   Final    NO GROWTH 3 DAYS Performed at [facility], [address], [place], [place] [address]    Report Status PENDING  Incomplete  Blood Culture (routine x 2)     Status: None (Preliminary result)   Collection Time: 07/27/19  5:20 PM   Specimen: Left Antecubital; Blood  Result Value Ref Range Status   Specimen Description LEFT ANTECUBITAL  Final   Special Requests   Final    BOTTLES DRAWN AEROBIC AND ANAEROBIC Blood Culture adequate volume   Culture   Final    NO GROWTH 3 DAYS Performed at [facility], [address], [place], [place] [address]    Report Status PENDING  Incomplete  Urine culture     Status: Abnormal   Collection Time: 07/27/19  6:35 PM   Specimen: In/Out Cath Urine  Result Value Ref Range Status   Specimen Description   Final    IN/OUT CATH URINE Performed at [facility], [address], [place], [place] [address]    Special Requests   Final    NONE Performed at [facility], [address], [place], [place] [address]    Culture 50,000 COLONIES/mL ENTEROCOCCUS FAECALIS (A)  Final   Report Status 07/30/2019 FINAL  Final   Organism ID, Bacteria ENTEROCOCCUS FAECALIS (A)  Final      Susceptibility   Enterococcus faecalis - MIC*    AMPICILLIN <=2 SENSITIVE Sensitive     NITROFURANTOIN <=16 SENSITIVE Sensitive     VANCOMYCIN 2 SENSITIVE Sensitive     * 50,000 COLONIES/mL ENTEROCOCCUS FAECALIS  MRSA PCR Screening     Status: None   Collection Time: 07/29/19  6:03 AM   Specimen: Nasal Mucosa; Nasopharyngeal  Result Value Ref Range Status   MRSA by PCR NEGATIVE NEGATIVE Final    Comment:        The GeneXpert MRSA Assay  (FDA approved for NASAL specimens only), is one component of a comprehensive MRSA colonization surveillance program. It is not intended to diagnose MRSA infection nor to guide or monitor treatment  for MRSA infections. Performed at [facility], [address] [place], [place] [address]          Radiology Studies: DG Chest 1 View  Result Date: 07/28/2019 CLINICAL DATA:  Dyspnea. Large epigastric hernia. EXAM: CHEST  1 VIEW COMPARISON:  07/27/2019 FINDINGS: Signs of median sternotomy and aortic valve replacement with left atrial clipping. Marked cardiomegaly. Low lung volumes. Blunting of right costodiaphragmatic sulcus. Left and right hemidiaphragm are obscured. Study limited by patient body habitus. No acute bone finding. IMPRESSION: Low lung volumes with bibasilar atelectasis and/or infiltrates. Marked cardiomegaly without signs of failure but with potential pulmonary vascular congestion. Signs of aortic valve replacement and left atrial clipping. Electronically Signed   By: Zetta Bills M.D.   On: 07/28/2019 18:02        Scheduled Meds: . apixaban  2.5 mg Oral BID  . atorvastatin  40 mg Oral q1800  . busPIRone  10 mg Oral BID  . diltiazem  240 mg Oral Daily  . DULoxetine  20 mg Oral Daily  . feeding supplement (ENSURE ENLIVE)  237 mL Oral Q24H  . ferrous sulfate  325 mg Oral Q breakfast  . fluticasone furoate-vilanterol  1 puff Inhalation Daily  . [START ON 07/31/2019] metoprolol succinate  25 mg Oral Daily  . multivitamin with minerals  1 tablet Oral Daily   Continuous Infusions: . [START ON 07/31/2019] ceFEPime (MAXIPIME) IV    . sodium chloride       LOS: 3 days    Time spent: 30 minutes    Javon Snee Darleen Crocker, DO [facility] Pager [phone]  If 7PM-7AM, please contact night-coverage [email or web address] Password [email or web address] 07/30/2019, 1:03 PM

## 2019-07-30 NOTE — Progress Notes (Signed)
PHARMACY NOTE:  ANTIMICROBIAL RENAL DOSAGE ADJUSTMENT  Current antimicrobial regimen includes a mismatch between antimicrobial dosage and estimated renal function.  As per policy approved by the Pharmacy & Therapeutics and Medical Executive Committees, the antimicrobial dosage will be adjusted accordingly.  Current antimicrobial dosage:  Cefepime 2000 mg IV every 12 hours Indication: HCAP  Renal Function:  Estimated Creatinine Clearance: 27.6 mL/min (A) (by C-G formula based on SCr of 1.66 mg/dL (H)). []      On intermittent HD, scheduled: []      On CRRT    Antimicrobial dosage has been changed to:  Cefepime 2000 mg IV every 24 hours.    Thank you for allowing pharmacy to be a part of this patient's care.  Ramond Craver, [facility] 07/30/2019 11:58 AM

## 2019-07-30 NOTE — Progress Notes (Signed)
CRITICAL VALUE ALERT  Critical Value:  plt [id number]  Date & Time Notied:  07/30/2019 at [place]  Provider Notified: Truddie Coco  Orders Received/Actions taken: No new orders at this time.

## 2019-07-30 NOTE — Plan of Care (Signed)
  Problem: Acute Rehab PT Goals(only PT should resolve) Goal: Pt Will Go Supine/Side To Sit Outcome: Progressing Flowsheets (Taken 07/30/2019 0919) Pt will go Supine/Side to Sit: with modified independence Goal: Patient Will Transfer Sit To/From Stand Outcome: Progressing Flowsheets (Taken 07/30/2019 0919) Patient will transfer sit to/from stand: with min guard assist Goal: Pt Will Transfer Bed To Chair/Chair To Bed Outcome: Progressing Flowsheets (Taken 07/30/2019 0919) Pt will Transfer Bed to Chair/Chair to Bed: min guard assist Goal: Pt Will Ambulate Outcome: Progressing Flowsheets (Taken 07/30/2019 0919) Pt will Ambulate:  50 feet  with min guard assist  with minimal assist  with rolling walker   9:19 AM, 07/30/19 Lonell Grandchild, MPT Physical Therapist with [facility] [phone] office [phone] mobile phone

## 2019-07-30 NOTE — Consult Note (Signed)
NAME:  Nicole [name], MRN:  [id number], DOB:  11-25-1935, LOS: 3 ADMISSION DATE:  07/27/2019, CONSULTATION DATE:  07/30/2019 REFERRING MD:  Dr. Manuella Ghazi, [facility], CHIEF COMPLAINT:  Short of breath  Brief History   84 yo female former smoker presented with worsening dyspnea and cough.  Had recent course of doxycycline and prednisone as outpt prior to admission.  Admitted with recurrent COPD exacerbation and HCAP.  History of present illness   She was in hospital recently with A fib and CHF.  Has noticed more trouble with her breathing since.  She was started on ABx for possible HCAP.  She has been getting diuretics.  She has been using breo.  She feels this helps, but concerned about expense.  Transitioned to room air at rest, but was told by PT that she would need supplemental oxygen with activity.  Leg swelling has gotten better.  Has cough with beige colored sputum.  Denies sinus congestion, sore throat, dysphagia, hemoptysis, chest pain, nausea.  Gets intermittent discomfort in abdomen from large ventral hernia, but these symptoms usually don't last long and resolve spontaneously.  Past Medical History  COPD, A fib, CML with thrombocytosis, Chronic diastolic CHF, severe AS and moderate MR s/p bioprosthetic AVR/MV ring/MAZE 2014, Amiodarone induced hyperthyroidism  Significant Hospital Events   2/26 Admit  Consults:    Procedures:    Significant Diagnostic Tests:  PFT 10/30/13 >> FEV1 1.34 (68%), FEV1% 73, TLC 3.60 (73%), DLCO 50%, +BD Echo 06/25/19 >> EF 55 to [id number], mild RV systolic dysfx, severe LA and RA dilation, s/p MVR, moderately elevated PASP CT angio chest 06/24/19 >> atherosclerosis, clipping of LA, 4.6 x 3.2 Lt thyroid enlargement, tiny pleural effusions, kyphosis, large ventral hernia  Micro Data:  SARS CoV2 PCR 2/26 >> negative Influenza PCR 2/26 >> negative Pneumococcal Ag 2/26 >> negative  Antimicrobials:  Vancomycin 2/26 >> 2/27 Cefepime 2/26 >>   Interim history/subjective:     Objective   Blood pressure (!) 82/57, pulse 78, temperature 97.9 F (36.6 C), temperature source Oral, resp. rate 18, height 5\' 3"  (1.6 m), weight 88.3 kg, SpO2 94 %.        Intake/Output Summary (Last 24 hours) at 07/30/2019 1022 Last data filed at 07/29/2019 1352 Gross per 24 hour  Intake 200 ml  Output 400 ml  Net -200 ml   Filed Weights   07/27/19 1506 07/29/19 0552 07/29/19 0744  Weight: 83 kg 85.9 kg 88.3 kg    Examination:  General - alert, pauses to catch her breath intermittently while talking Eyes - pupils reactive ENT - no sinus tenderness, no stridor, enlarged thyroid Cardiac - irregular, 2/6 SM Chest - coarse BS b/l, no wheeze/rales Abdomen - soft, non tender, + bowel sounds, large ventral hernia Extremities - minimal ankle edema Skin - no rashes Neuro - normal strength, moves extremities, follows commands Lymphatics - no lymphadenopathy Psych - normal mood and behavior   Discussion:  84 yo female smoker with multifactorial dyspnea: 1) COPD exacerbation with hx of COPD and positive bronchodilator response, 2) possible HCAP, 3) Acute on chronic diastolic CHF with hx of A fib and valvular heart disease, 4) restrictive lung disease from kyphosis, obesity and ventral hernia, 5) anemia, 6) deconditioning.  It seems that most of her issues at present are chronic in nature rather than an acute reversible process.  Assessment & Plan:   COPD exacerbation with hx of COPD and positive bronchodilator response. - completed prednisone taper - continue breo with prn albuterol;  reassess inhaler regimen at outpt - assess for home oxygen  Restrictive lung disease. - from kyphosis, large ventral hernia, obesity - would benefit from weight reduction  Possible HCAP. - day 4 of ABx >> suspect 5 day course of ABx should be sufficient  Permanent A fib s/p MAZE. Acute on chronic diastolic CHF. Valvular heart disease s/p bioprosthetic AVR and mitral annuloplasty. Hx of  HLD. - continue apixaban, atorvastatin, cardizem CD, toprol XL  AKI likely from diuresis. - baseline creatinine 0.93 from 06/25/19 - defer adjustment to torsemide to primary team - f/u BMET  Goiter. - per primary team  Hx of CML with thrombocytosis. Anemia of chronic disease. - in chronic phase - followed by Dr. Delton Coombes with hematology/oncology as outpt - declined bone marrow bx at outpt visit on 2/23/2, and started on Gleevac - consider PRBC transfuse if Hb < 7  Deconditioning. - PT/OT - recommending SNF  Best practice:  Diet: heart healthy DVT prophylaxis: apixiban GI prophylaxis: not indicated Mobility: OOB Code Status: DNR Disposition: telemetry  Labs   CBC: Recent Labs  Lab 07/24/19 1056 07/27/19 1529 07/28/19 0710 07/29/19 0629 07/30/19 0454  WBC 13.6* 18.3* 16.0* 29.2* 23.0*  NEUTROABS 10.1* 13.6*  --   --   --   HGB 8.7* 9.0* 8.2* 7.8* 7.7*  HCT 29.2* 30.7* 28.2* 27.5* 26.8*  MCV 91.0 90.0 91.0 90.2 88.7  PLT 1,770* 2,257* 1,924* 2,025* 2,077*    Basic Metabolic Panel: Recent Labs  Lab 07/24/19 1056 07/27/19 1529 07/28/19 0710 07/29/19 0629 07/30/19 0454  NA 140 138 138 133* 135  K 4.1 3.8 4.4 4.4 4.5  CL 102 100 104 101 101  CO2 28 27 24 22 25   GLUCOSE 129* 146* 193* 163* 130*  BUN 18 21 21  38* 54*  CREATININE 1.20* 1.20* 1.14* 1.47* 1.66*  CALCIUM 8.9 9.0 8.7* 8.7* 9.0  MG 2.5*  --   --   --   --    GFR: Estimated Creatinine Clearance: 27.6 mL/min (A) (by C-G formula based on SCr of 1.66 mg/dL (H)). Recent Labs  Lab 07/27/19 1529 07/27/19 1709 07/27/19 1716 07/27/19 1925 07/28/19 0710 07/29/19 0629 07/30/19 0454  PROCALCITON  --   --  <0.10  --  <0.10 <0.10  --   WBC 18.3*  --   --   --  16.0* 29.2* 23.0*  LATICACIDVEN  --  2.2*  --  1.3  --   --   --     Liver Function Tests: Recent Labs  Lab 07/24/19 1056 07/27/19 1529  AST 27 28  ALT 31 27  ALKPHOS 65 65  BILITOT 0.9 1.0  PROT 6.4* 6.6  ALBUMIN 3.8 3.8   No  results for input(s): LIPASE, AMYLASE in the last 168 hours. No results for input(s): AMMONIA in the last 168 hours.  ABG    Component Value Date/Time   PHART 7.302 (L) 03/05/2013 2111   PCO2ART 48.0 (H) 03/05/2013 2111   PO2ART 78.0 (L) 03/05/2013 2111   HCO3 23.8 03/05/2013 2111   TCO2 23 03/06/2013 1624   ACIDBASEDEF 3.0 (H) 03/05/2013 2111   O2SAT 94.0 03/05/2013 2111     Coagulation Profile: Recent Labs  Lab 07/27/19 1529  INR 1.9*    Cardiac Enzymes: No results for input(s): CKTOTAL, CKMB, CKMBINDEX, TROPONINI in the last 168 hours.  HbA1C: Hgb A1c MFr Bld  Date/Time Value Ref Range Status  03/04/2013 01:28 PM 5.4 <5.7 % Final    Comment:    (  NOTE)                                                                       According to the ADA Clinical Practice Recommendations for 2011, when HbA1c is used as a screening test:  >=6.5%   Diagnostic of Diabetes Mellitus           (if abnormal result is confirmed) 5.7-6.4%   Increased risk of developing Diabetes Mellitus References:Diagnosis and Classification of Diabetes Mellitus,Diabetes [id number] 1):S62-S69 and Standards of Medical Care in         Diabetes - 2011,Diabetes [id number] (Suppl 1):S11-S61.    CBG: No results for input(s): GLUCAP in the last 168 hours.  Review of Systems:   Reviewed and negative.  Past Medical History  She,  has a past medical history of Anxiety, Atrial fibrillation ([place]), COPD (chronic obstructive pulmonary disease) ([place]), Diastolic CHF ([place]), Hyperlipidemia, and SOB (shortness of breath).   Surgical History    Past Surgical History:  Procedure Laterality Date  . ABDOMINAL HYSTERECTOMY    . AORTIC VALVE REPLACEMENT N/A 03/05/2013   Procedure: AORTIC VALVE REPLACEMENT (AVR);  Surgeon: Gaye Pollack, MD;  Location: [place];  Service: Open Heart Surgery;  Laterality: N/A;  . BREAST BIOPSY     x3  . CARDIOVERSION N/A 02/13/2013   Procedure: CARDIOVERSION;  Surgeon: [name], MD;  Location[facility] ENDOSCOPY;  Service: Cardiovascular;  Laterality: N/A;  . CARDIOVERSION N/A 03/01/2013   Procedure: CARDIOVERSION;  Surgeon: [name], MD;  Location[facility] ENDOSCOPY;  Service: Cardiovascular;  Laterality: N/A;  . CARDIOVERSION N/A 06/23/2016   Procedure: CARDIOVERSION;  Surgeon: [name], MD;  Location: [place];  Service: Cardiovascular;  Laterality: N/A;  . CATARACT EXTRACTION    . COLONOSCOPY WITH PROPOFOL N/A 06/24/2017   Procedure: COLONOSCOPY WITH PROPOFOL;  Surgeon: Carol Ada, MD;  Location: [place];  Service: Endoscopy;  Laterality: N/A;  . ESOPHAGOGASTRODUODENOSCOPY N/A 06/24/2017   Procedure: ESOPHAGOGASTRODUODENOSCOPY (EGD);  Surgeon: Carol Ada, MD;  Location: [place];  Service: Endoscopy;  Laterality: N/A;  . INTRAOPERATIVE TRANSESOPHAGEAL ECHOCARDIOGRAM N/A 03/05/2013   Procedure: INTRAOPERATIVE TRANSESOPHAGEAL ECHOCARDIOGRAM;  Surgeon: Gaye Pollack, MD;  Location: [place] [facility];  Service: Open Heart Surgery;  Laterality: N/A;  . LEFT AND RIGHT HEART CATHETERIZATION WITH CORONARY ANGIOGRAM N/A 03/02/2013   Procedure: LEFT AND RIGHT HEART CATHETERIZATION WITH CORONARY ANGIOGRAM;  Surgeon: [name], MD;  Location: [facility] CATH LAB;  Service: Cardiovascular;  Laterality: N/A;  . MAZE N/A 03/05/2013   Procedure: MAZE;  Surgeon: Gaye Pollack, MD;  Location: [place];  Service: Open Heart Surgery;  Laterality: N/A;  . MITRAL VALVE REPAIR N/A 03/05/2013   Procedure: MITRAL VALVE REPAIR (MVR);  Surgeon: Gaye Pollack, MD;  Location: [place];  Service: Open Heart Surgery;  Laterality: N/A;  . ROTATOR CUFF REPAIR    . TEE WITHOUT CARDIOVERSION N/A 03/01/2013   Procedure: TRANSESOPHAGEAL ECHOCARDIOGRAM (TEE);  Surgeon: [name], MD;  Location: [place];  Service: Cardiovascular;  Laterality: N/A;  talked to bev. booking no. is [id number] called trish to verify time  . TONSILLECTOMY       Social History   reports that she quit smoking  about 6 years  ago. Her smoking use included cigarettes. She started smoking about 64 years ago. She has a 57.00 pack-year smoking history. She has never used smokeless tobacco. She reports that she does not drink alcohol or use drugs.   Family History   Her family history includes Breast cancer in her daughter and another family member; Heart attack in her son; Heart disease in her brother and mother; Throat cancer in her father.   Allergies No Known Allergies   Home Medications  Prior to Admission medications   Medication Sig Start Date End Date Taking? Authorizing Provider  albuterol (VENTOLIN HFA) 108 (90 Base) MCG/ACT inhaler Inhale 2 puffs into the lungs as needed.  05/09/19  Yes [provider]  atorvastatin (LIPITOR) 40 MG tablet TAKE 1 TABLET BY MOUTH EVERY DAY IN THE EVENING 03/01/19  Yes McLean, Elby Showers, MD  BREO ELLIPTA 100-25 MCG/INH AEPB Inhale 1 puff into the lungs daily. 06/20/19  Yes [provider]  busPIRone (BUSPAR) 10 MG tablet Take 10 mg by mouth 2 (two) times daily as needed. 07/23/19  Yes [provider]  diltiazem (CARDIZEM CD) 240 MG 24 hr capsule TAKE 1 CAPSULE BY MOUTH EVERY DAY 03/01/19  Yes [name], MD  DULoxetine (CYMBALTA) 20 MG capsule Take 1 capsule (20 mg total) by mouth daily. 06/24/17  Yes Nedrud, Larena Glassman, MD  ELIQUIS 5 MG TABS tablet TAKE 1 TABLET BY MOUTH TWICE A DAY 03/21/19  Yes [name], MD  ergocalciferol (VITAMIN D2) 1.25 MG (50000 UT) capsule Take 1 capsule (50,000 Units total) by mouth once a week. 07/09/19  Yes Derek Jack, MD  ferrous sulfate 325 (65 FE) MG EC tablet Take 325 mg by mouth daily with breakfast.   Yes [provider]  imatinib (GLEEVEC) 100 MG tablet Take 1 tablet (100 mg total) by mouth daily. Take with meals and large glass of water.Caution:Chemotherapy 07/24/19  Yes Derek Jack, MD  metoprolol succinate (TOPROL-XL) 25 MG 24 hr tablet Take 1 tablet (25 mg total) by mouth  2 (two) times daily. 07/16/19  Yes [name], MD  Multiple Vitamins-Minerals (MULTIVITAMIN WITH MINERALS) tablet Take 1 tablet by mouth daily.   Yes [provider]  naproxen sodium (ALEVE) 220 MG tablet Take 220 mg by mouth daily as needed (for pain).   Yes [provider]  potassium chloride SA (KLOR-CON M20) 20 MEQ tablet Take 2 tablets (40 mEq total) by mouth 2 (two) times daily. 07/17/19  Yes [name], MD  prochlorperazine (COMPAZINE) 10 MG tablet Take 1 tablet (10 mg total) by mouth every 6 (six) hours as needed for nausea or vomiting. 07/24/19  Yes Derek Jack, MD  torsemide (DEMADEX) 20 MG tablet Take 4 tablets by mouth every morning and 2 tablets by mouth every other afternoon 07/16/19  Yes [name], MD     Chesley Mires, MD [place] 07/30/2019, 11:32 AM

## 2019-07-30 NOTE — Care Management Important Message (Signed)
Important Message  Patient Details  Name: Nicole [name] MRN: [id number] Date of Birth: 1935-10-25   Medicare Important Message Given:  Yes     [name] 07/30/2019, 11:05 AM

## 2019-07-30 NOTE — NC FL2 (Signed)
[place] MEDICAID FL2 LEVEL OF CARE SCREENING TOOL     IDENTIFICATION  Patient Name: Nicole [name] Birthdate: 03-09-1936 Sex: female Admission Date (Current Location): 07/27/2019  [facility] and [place] Number:  [employer] and Address:  [place] [address], [place]      Provider Number: [id number]  Attending Physician Name and Address:  Rodena Goldmann, DO  Relative Name and Phone Number:  Heide Guile - daughter [phone]    Current Level of Care: Hospital Recommended Level of Care: [place] Prior Approval Number:    Date Approved/Denied:   PASRR Number:    Discharge Plan: SNF    Current Diagnoses: Patient Active Problem List   Diagnosis Date Noted  . HCAP (healthcare-associated pneumonia) 07/27/2019  . CML (chronic myelocytic leukemia) ([place]) 07/09/2019  . Acute on chronic congestive heart failure ([place])   . COPD exacerbation ([place])   . Thyroid enlarged   . Class 2 obesity due to excess calories with body mass index (BMI) of 36.0 to 36.9 in adult   . SOB (shortness of breath) 06/25/2019  . Dyspnea 06/24/2019  . Thrombocytosis ([place]) 06/11/2019  . Chronic back pain 06/24/2017  . Symptomatic anemia 06/23/2017  . Hyperthyroidism 08/05/2015  . Chronic diastolic CHF (congestive heart failure) ([place]) 02/28/2013  . Aortic stenosis 02/28/2013  . CAD (coronary artery disease) 02/11/2013  . Acute on chronic diastolic HF (heart failure) ([place]) 02/11/2013  . Atrial fibrillation ([place]) 02/11/2013    Orientation RESPIRATION BLADDER Height & Weight     Self, Time, Situation, Place  O2 External catheter Weight: 88.3 kg Height:  5\' 3"  (160 cm)  BEHAVIORAL SYMPTOMS/MOOD NEUROLOGICAL BOWEL NUTRITION STATUS      Continent Diet(Heart Healthy)  AMBULATORY STATUS COMMUNICATION OF NEEDS Skin   Extensive Assist Verbally Normal                       Personal Care Assistance Level of Assistance  Bathing, Dressing,  Feeding Bathing Assistance: Limited assistance Feeding assistance: Independent Dressing Assistance: Limited assistance     Functional Limitations Info  Sight, Hearing, Speech Sight Info: Adequate Hearing Info: Adequate Speech Info: Adequate    SPECIAL CARE FACTORS FREQUENCY  PT (By licensed PT)     PT Frequency: 5 times a week              Contractures Contractures Info: Not present    Additional Factors Info  Code Status, Allergies Code Status Info: DNR Allergies Info: NKDA           Current Medications (07/30/2019):  This is the current hospital active medication list Current Facility-Administered Medications  Medication Dose Route Frequency Provider Last Rate Last Admin  . acetaminophen (TYLENOL) tablet 650 mg  650 mg Oral Q6H PRN Heath Lark D, DO   650 mg at 07/28/19 1439  . albuterol (PROVENTIL) (2.5 MG/3ML) 0.083% nebulizer solution 2.5 mg  2.5 mg Nebulization Q2H PRN Truett Mainland, DO      . ALPRAZolam Duanne Moron) tablet 0.25 mg  0.25 mg Oral TID PRN Manuella Ghazi, Pratik D, DO   0.25 mg at 07/29/19 1707  . apixaban (ELIQUIS) tablet 2.5 mg  2.5 mg Oral BID Manuella Ghazi, Pratik D, DO   2.5 mg at 07/30/19 1028  . atorvastatin (LIPITOR) tablet 40 mg  40 mg Oral q1800 Truett Mainland, DO   40 mg at 07/29/19 1707  . busPIRone (BUSPAR) tablet 10 mg  10 mg Oral BID  Truett Mainland, DO   10 mg at 07/30/19 1029  . ceFEPIme (MAXIPIME) 2 g in sodium chloride 0.9 % 100 mL IVPB  2 g Intravenous Q12H Truett Mainland, DO 200 mL/hr at 07/30/19 0459 2 g at 07/30/19 0459  . diltiazem (CARDIZEM CD) 24 hr capsule 240 mg  240 mg Oral Daily Manuella Ghazi, Pratik D, DO   240 mg at 07/29/19 1240  . DULoxetine (CYMBALTA) DR capsule 20 mg  20 mg Oral Daily Truett Mainland, DO   20 mg at 07/30/19 1030  . feeding supplement (ENSURE ENLIVE) (ENSURE ENLIVE) liquid 237 mL  237 mL Oral Q24H Shah, Pratik D, DO   237 mL at 07/29/19 1224  . ferrous sulfate tablet 325 mg  325 mg Oral Q breakfast Truett Mainland, DO   325  mg at 07/30/19 1029  . fluticasone furoate-vilanterol (BREO ELLIPTA) 100-25 MCG/INH 1 puff  1 puff Inhalation Daily Truett Mainland, DO   1 puff at 07/30/19 [id number]  . levalbuterol (XOPENEX) nebulizer solution 0.63 mg  0.63 mg Nebulization Q6H PRN Manuella Ghazi, Pratik D, DO      . metoprolol succinate (TOPROL-XL) 24 hr tablet 25 mg  25 mg Oral BID Manuella Ghazi, Pratik D, DO   25 mg at 07/29/19 1239  . multivitamin with minerals tablet 1 tablet  1 tablet Oral Daily Manuella Ghazi, Pratik D, DO   1 tablet at 07/30/19 1028  . ondansetron (ZOFRAN) injection 4 mg  4 mg Intravenous Q6H PRN Manuella Ghazi, Pratik D, DO   4 mg at 07/29/19 1708  . prochlorperazine (COMPAZINE) tablet 10 mg  10 mg Oral Q6H PRN Truett Mainland, DO      . sodium chloride 0.9 % bolus 1,000 mL  1,000 mL Intravenous Once Stinson, Jacob J, DO      . torsemide [facility]) tablet 20 mg  20 mg Oral BID Heath Lark D, DO         Discharge Medications: Please see discharge summary for a list of discharge medications.  Relevant Imaging Results:  Relevant Lab Results:   Additional Information SS# [id number]  Boneta Lucks, RN

## 2019-07-30 NOTE — Progress Notes (Signed)
Informed by central telemetry that patient is sustaining heart rate in 120's-130's. Patient denies chest pain or discomfort. MD has been notified and received order to administer diltiazem (Cardizem)  tablet 60 mg oral every 6 hours. Will continue to monitor patient.

## 2019-07-30 NOTE — Evaluation (Signed)
Physical Therapy Evaluation Patient Details Name: Nicole [name] MRN: [id number] DOB: Oct 23, 1935 Today's Date: 07/30/2019   History of Present Illness  Nicole [name] is a 84 y.o. female with a history of COPD, diastolic heart failure, atrial fibrillation on anticoagulation, CML with thrombocytosis.  She was recently admitted about a month ago for similar complaints.  She DU with A. fib and RVR.  She did complete doxycycline for total of 5 days for bronchitis/bronchiectasis.  Today, she presents with 1 week of worsening shortness of breath which has been worsening and is now severe.  Symptoms are worse with ambulation and improved with rest.  No other palliating or provoking factors.  Have mild cough.  She had been prescribed doxycycline and prednisone by her oncology physician, although she has not been get these medications.    Clinical Impression  Patient demonstrates slow labored movement requiring use of bed rail for sitting up at bedside, once sitting became SOB with coughing, slow labored cadence without loss of balance during ambulation, limited secondary to fatigue and SOB with SpO2 at 90-92% while on room air when walking, tolerated sitting up in chair after therapy with SpO2 at 94%, patient left on room air - RN notified.  Patient will benefit from continued physical therapy in hospital and recommended venue below to increase strength, balance, endurance for safe ADLs and gait.    Follow Up Recommendations SNF;Supervision for mobility/OOB;Supervision - Intermittent    Equipment Recommendations  None recommended by PT    Recommendations for Other Services       Precautions / Restrictions Precautions Precautions: Fall Restrictions Weight Bearing Restrictions: No      Mobility  Bed Mobility Overal bed mobility: Needs Assistance Bed Mobility: Supine to Sit     Supine to sit: Supervision     General bed mobility comments: increased time, labored movement, required use of bed  rail  Transfers Overall transfer level: Needs assistance Equipment used: Rolling walker (2 wheeled) Transfers: Sit to/from [facility] Sit to Stand: Min guard;Min assist Stand pivot transfers: Min guard;Min assist       General transfer comment: slow labored movement  Ambulation/Gait Ambulation/Gait assistance: Min assist Gait Distance (Feet): 25 Feet Assistive device: Rolling walker (2 wheeled) Gait Pattern/deviations: Decreased step length - right;Decreased step length - left;Decreased stride length Gait velocity: decreased   General Gait Details: slow labored cadence with occasional standing rest breaks due to difficulty breathing, on room air with SpO2 between 90-92%, limited secondary to c/o fatigue  Stairs            Wheelchair Mobility    Modified Rankin (Stroke Patients Only)       Balance Overall balance assessment: Needs assistance Sitting-balance support: Feet supported;No upper extremity supported Sitting balance-Leahy Scale: Good Sitting balance - Comments: seated at EOB   Standing balance support: During functional activity;Bilateral upper extremity supported Standing balance-Leahy Scale: Fair Standing balance comment: using RW                             Pertinent Vitals/Pain Pain Assessment: No/denies pain    Home Living Family/patient expects to be discharged to:: Private residence Living Arrangements: Alone Available Help at Discharge: Friend(s);Available PRN/intermittently Type of Home: Apartment Home Access: Level entry     Home Layout: One level Home Equipment: Walker - 2 wheels;Cane - single point;Tub bench      Prior Function Level of Independence: Independent with assistive device(s)  Comments: household ambulator usually leaning on furniture or using SPC, uses RW for longer distances     Hand Dominance   Dominant Hand: Right    Extremity/Trunk Assessment   Upper Extremity  Assessment Upper Extremity Assessment: Defer to OT evaluation    Lower Extremity Assessment Lower Extremity Assessment: Generalized weakness    Cervical / Trunk Assessment Cervical / Trunk Assessment: Normal  Communication   Communication: No difficulties  Cognition Arousal/Alertness: Awake/alert Behavior During Therapy: WFL for tasks assessed/performed Overall Cognitive Status: Within Functional Limits for tasks assessed                                        General Comments      Exercises     Assessment/Plan    PT Assessment Patient needs continued PT services  PT Problem List Decreased strength;Decreased activity tolerance;Decreased balance;Decreased mobility;Cardiopulmonary status limiting activity       PT Treatment Interventions Balance training;Gait training;Functional mobility training;Therapeutic activities;Therapeutic exercise;Patient/family education;Stair training    PT Goals (Current goals can be found in the Care Plan section)  Acute Rehab PT Goals Patient Stated Goal: return home with friends/family to assist PT Goal Formulation: With patient Time For Goal Achievement: 08/13/19 Potential to Achieve Goals: Good    Frequency Min 3X/week   Barriers to discharge        Co-evaluation               AM-PAC PT "6 Clicks" Mobility  Outcome Measure Help needed turning from your back to your side while in a flat bed without using bedrails?: None Help needed moving from lying on your back to sitting on the side of a flat bed without using bedrails?: A Little Help needed moving to and from a bed to a chair (including a wheelchair)?: A Little Help needed standing up from a chair using your arms (e.g., wheelchair or bedside chair)?: A Little Help needed to walk in hospital room?: A Little Help needed climbing 3-5 steps with a railing? : A Lot 6 Click Score: 18    End of Session   Activity Tolerance: Patient tolerated treatment  well;Patient limited by fatigue Patient left: in chair;with call bell/phone within reach Nurse Communication: Mobility status PT Visit Diagnosis: Unsteadiness on feet (R26.81);Other abnormalities of gait and mobility (R26.89);Muscle weakness (generalized) (M62.81)    Time: [id number] PT Time Calculation (min) (ACUTE ONLY): 33 min   Charges:   PT Evaluation $PT Eval Moderate Complexity: 1 Mod PT Treatments $Therapeutic Activity: 23-37 mins        9:17 AM, 07/30/19 Lonell Grandchild, MPT Physical Therapist with [facility] [phone] office [phone] mobile phone

## 2019-07-31 ENCOUNTER — Encounter (HOSPITAL_COMMUNITY): Payer: Self-pay | Admitting: Student

## 2019-07-31 ENCOUNTER — Inpatient Hospital Stay (HOSPITAL_COMMUNITY): Payer: Medicare Other

## 2019-07-31 DIAGNOSIS — I5033 Acute on chronic diastolic (congestive) heart failure: Secondary | ICD-10-CM

## 2019-07-31 LAB — BASIC METABOLIC PANEL
Anion gap: 7 (ref 5–15)
BUN: 48 mg/dL — ABNORMAL HIGH (ref 8–23)
CO2: 24 mmol/L (ref 22–32)
Calcium: 8.8 mg/dL — ABNORMAL LOW (ref 8.9–10.3)
Chloride: 106 mmol/L (ref 98–111)
Creatinine, Ser: 1.41 mg/dL — ABNORMAL HIGH (ref 0.44–1.00)
GFR calc Af Amer: 40 mL/min — ABNORMAL LOW (ref >60)
GFR calc non Af Amer: 35 mL/min — ABNORMAL LOW (ref >60)
Glucose, Bld: 113 mg/dL — ABNORMAL HIGH (ref 70–99)
Potassium: 4.6 mmol/L (ref 3.5–5.1)
Sodium: 137 mmol/L (ref 135–145)

## 2019-07-31 LAB — CBC
HCT: 26.1 % — ABNORMAL LOW (ref 36.0–46.0)
Hemoglobin: 7.6 g/dL — ABNORMAL LOW (ref 12.0–15.0)
MCH: 25.9 pg — ABNORMAL LOW (ref 26.0–34.0)
MCHC: 29.1 g/dL — ABNORMAL LOW (ref 30.0–36.0)
MCV: 88.8 fL (ref 80.0–100.0)
Platelets: 1851 10*3/uL (ref 150–400)
RBC: 2.94 MIL/uL — ABNORMAL LOW (ref 3.87–5.11)
RDW: 17.6 % — ABNORMAL HIGH (ref 11.5–15.5)
WBC: 15.6 10*3/uL — ABNORMAL HIGH (ref 4.0–10.5)
nRBC: 0.3 % — ABNORMAL HIGH (ref 0.0–0.2)

## 2019-07-31 LAB — T3, FREE: T3, Free: 1.9 pg/mL — ABNORMAL LOW (ref 2.0–4.4)

## 2019-07-31 LAB — CORTISOL-AM, BLOOD: Cortisol - AM: 14 ug/dL (ref 6.7–22.6)

## 2019-07-31 MED ORDER — APIXABAN 5 MG PO TABS
5.0000 mg | ORAL_TABLET | Freq: Two times a day (BID) | ORAL | Status: DC
  Administered 2019-07-31 – 2019-08-09 (×19): 5 mg via ORAL
  Filled 2019-07-31 (×19): qty 1

## 2019-07-31 MED ORDER — ALPRAZOLAM 0.5 MG PO TABS
0.5000 mg | ORAL_TABLET | Freq: Three times a day (TID) | ORAL | Status: DC | PRN
  Administered 2019-07-31 – 2019-08-07 (×3): 0.5 mg via ORAL
  Filled 2019-07-31 (×3): qty 1

## 2019-07-31 MED ORDER — SODIUM CHLORIDE 0.9 % IV SOLN
2.0000 g | Freq: Once | INTRAVENOUS | Status: AC
  Administered 2019-07-31: 2 g via INTRAVENOUS
  Filled 2019-07-31: qty 2

## 2019-07-31 NOTE — Progress Notes (Signed)
CRITICAL VALUE ALERT  Critical Value:  Platelets 1851  Date & Time Notied:  07/31/19 0616  Provider Notified: Dr. Volanda Napoleon.  Orders Received/Actions taken:

## 2019-07-31 NOTE — Progress Notes (Signed)
PROGRESS NOTE    Nicole [name]  [id number] DOB: 19-Jun-1935 DOA: 07/27/2019 PCP: Frances Maywood, FNP   Brief Narrative:  Per HPI: Nicole [name] a 84 y.o.femalewith a history of COPD, diastolic heart failure, atrial fibrillation on anticoagulation, CML with thrombocytosis. She was recently admitted about a month ago for similar complaints. She DU with A. fib and RVR. She did complete doxycycline for total of 5 days for bronchitis/bronchiectasis. Today, she presents with 1 week of worsening shortness of breath which has been worsening and is now severe. Symptoms are worse with ambulation and improved with rest. No other palliating or provoking factors. Have mild cough. She had been prescribed doxycycline and prednisone by her oncology physician, although she has not been get these medications.  2/27:Patient was admitted with dyspnea likely secondary to developing HCAP. She appears to be feeling better this morning and has been started on vancomycin and cefepime. She was receiving a high rate of IV fluids which has been discontinued and she has been started back on her torsemide today. We will continue to monitor strict I's and O's and wean oxygen as tolerated.  2/28:Patient appears to be feeling better this morning after further diuresis yesterday. We will plan to wean oxygen levels and monitor blood pressures. Ambulate by tomorrow if blood pressure is more stable and anticipate discharge with home oxygen if needed. Continue current antibiotics and discontinue vancomycin as MRSA PCR negative.  3/1: Appreciate PCCM evaluation.  Plan to continue IV antibiotics for 1 more day.  Administered oral torsemide this morning and will hold further diuretics this afternoon given soft blood pressure readings and worsening renal function.  Toprol-XL to also be held this evening.  Continue monitor heart rates and blood pressures.  Monitor a.m. labs.  PT evaluation recommending SNF which  patient is agreeable to.  3/2: Appreciate cardiology evaluation today with plans to continue her current medications as ordered and follow blood pressures which should improve as diuretics are withheld.  Continue to monitor closely.  2 view chest x-ray with increased right basilar atelectasis and pleural effusion noted.  Appreciate repeat pulmonology evaluation as well and try to wean off oxygen if possible, otherwise will likely need this on discharge.  Continue ongoing telemetry monitoring.  Anticipate discharge in the next 1-2 days if stabilized from a cardiac standpoint.  Repeat Covid test ordered for today in anticipation for discharge in the next 48 hours.  Discontinue antibiotics after today.  Assessment & Plan:   Active Problems:   CAD (coronary artery disease)   Atrial fibrillation (HCC)   Chronic diastolic CHF (congestive heart failure) (HCC)   Aortic stenosis   Thrombocytosis (HCC)   COPD exacerbation (HCC)   CML (chronic myelocytic leukemia) ([place])   HCAP (healthcare-associated pneumonia)   Acute hypoxemic respiratory failure likely secondary to HCAP with some mild pulmonary vascular congestion with acute on chronic diastolic heart failure -Discontinue cefepime after last dose this evening. -Breathing treatments as needed with incentive spirometry to help wean oxygen as tolerated, but will likely require this on discharge -Patient is normally on room air at home -Hold all diuresis for now given some AKI and soft blood pressure readings and monitor weights -Appreciate PCCM ongoing evaluation, 2 view chest x-ray today with right basilar atelectasis and pleural effusion noted -Continue IS  Atrial fibrillation -Currently rate controlled, but this is labile -Failed prior DCCV as well as amiodarone -Patient not a candidate for digoxin given unstable renal function -Appreciate cardiology evaluation with recommendations to continue the  same with short acting Cardizem as well as daily  dose of metoprolol XL.  Hopefully blood pressures and heart rate should stabilize with no further diuretics at this time. -Continue apixaban for anticoagulation  AKI-likely prerenal -Holding diuresis for now -Creatinine improved to 1.4 from 1.6 yesterday -Given torsemide yesterday morning with afternoon dose held on account of soft blood pressure readings and some upward trending creatinine levels -Urine electrolytes ordered and pending -Recheck a.m. labs -Monitor urine output, noted to be 1450 in the last 24 hours.  COPD with mild exacerbation related to above-improving -Steroids held on 2/28 and albuterol scheduled switch to Xopenex as needed  CML with thrombocytosis -Followed by Dr. Delton Coombes outpatient -Hemoglobin 7.6 and will consider transfusion for hemoglobin less than 7  Goiter with signs of hyperthyroidism -Previously on amiodarone and this is the likely culprit -T4 level slightly elevated with decreasing TSH -We will need endocrinology follow-up outpatient   DVT prophylaxis:Apixaban Code Status:DNR Family Communication:Discussed extensively with daughter on phone Disposition Plan:Discontinue cefepime after today.  Continue to monitor repeat labs as well as heart rate and blood pressures.  Appreciate further PCCM as well as cardiology recommendations.  Repeat Covid testing today in anticipation of discharge within the next 48 hours to SNF.   Consultants:  PCCM  Cardiology  Procedures:  See below  Antimicrobials:  Anti-infectives (From admission, onward)   Start     Dose/Rate Route Frequency Ordered Stop   07/31/19 1800  ceFEPIme (MAXIPIME) 2 g in sodium chloride 0.9 % 100 mL IVPB     2 g 200 mL/hr over 30 Minutes Intravenous  Once 07/31/19 0837     07/31/19 0600  ceFEPIme (MAXIPIME) 2 g in sodium chloride 0.9 % 100 mL IVPB     2 g 200 mL/hr over 30 Minutes Intravenous Every 24 hours 07/30/19 1144 07/31/19 0608   07/28/19 1800  vancomycin  (VANCOREADY) IVPB 500 mg/100 mL  Status:  Discontinued     500 mg 100 mL/hr over 60 Minutes Intravenous Every 24 hours 07/27/19 1744 07/29/19 0945   07/27/19 1800  ceFEPIme (MAXIPIME) 2 g in sodium chloride 0.9 % 100 mL IVPB  Status:  Discontinued     2 g 200 mL/hr over 30 Minutes Intravenous Every 12 hours 07/27/19 1714 07/30/19 1144   07/27/19 1800  vancomycin (VANCOREADY) IVPB 1500 mg/300 mL     1,500 mg 150 mL/hr over 120 Minutes Intravenous  Once 07/27/19 1714 07/27/19 2012   07/27/19 1700  vancomycin (VANCOCIN) IVPB 1000 mg/200 mL premix  Status:  Discontinued     1,000 mg 200 mL/hr over 60 Minutes Intravenous  Once 07/27/19 1658 07/27/19 1714   07/27/19 1700  ceFEPIme (MAXIPIME) 2 g in sodium chloride 0.9 % 100 mL IVPB  Status:  Discontinued     2 g 200 mL/hr over 30 Minutes Intravenous  Once 07/27/19 1658 07/27/19 1714       Subjective: Patient seen and evaluated today with no new acute complaints or concerns. No acute concerns or events noted overnight.  She states that she did not sleep well overnight and denies any significant dyspnea, chest pain, or palpitations.  Blood pressure still remains soft.  Objective: Vitals:   07/31/19 0100 07/31/19 0534 07/31/19 0726 07/31/19 0809  BP: 116/68 99/63    Pulse:  (!) 102 85   Resp:  20    Temp:  98.1 F (36.7 C)    TempSrc:  Axillary    SpO2: 94% 97%  96%  Weight:  Height:        Intake/Output Summary (Last 24 hours) at 07/31/2019 1012 Last data filed at 07/31/2019 0850 Gross per 24 hour  Intake 480 ml  Output 1450 ml  Net -970 ml   Filed Weights   07/27/19 1506 07/29/19 0552 07/29/19 0744  Weight: 83 kg 85.9 kg 88.3 kg    Examination:  General exam: Appears calm and comfortable, obese and sleepy Respiratory system: Clear to auscultation. Respiratory effort normal.  Currently on 2 L nasal cannula oxygen Cardiovascular system: S1 & S2 heard, irregular. No JVD, murmurs, rubs, gallops or clicks. No pedal  edema. Gastrointestinal system: Abdomen is nondistended, soft and nontender. No organomegaly or masses felt. Normal bowel sounds heard. Central nervous system: Alert and oriented. No focal neurological deficits. Extremities: Scant edema noted bilaterally. Skin: No rashes, lesions or ulcers Psychiatry: Judgement and insight appear normal. Mood & affect appropriate.     Data Reviewed: I have personally reviewed following labs and imaging studies  CBC: Recent Labs  Lab 07/24/19 1056 07/24/19 1056 07/27/19 1529 07/28/19 0710 07/29/19 0629 07/30/19 0454 07/31/19 0502  WBC 13.6*   < > 18.3* 16.0* 29.2* 23.0* 15.6*  NEUTROABS 10.1*  --  13.6*  --   --   --   --   HGB 8.7*   < > 9.0* 8.2* 7.8* 7.7* 7.6*  HCT 29.2*   < > 30.7* 28.2* 27.5* 26.8* 26.1*  MCV 91.0   < > 90.0 91.0 90.2 88.7 88.8  PLT 1,770*   < > 2,257* 1,924* 2,025* 2,077* 1,851*   < > = values in this interval not displayed.   Basic Metabolic Panel: Recent Labs  Lab 07/24/19 1056 07/24/19 1056 07/27/19 1529 07/28/19 0710 07/29/19 0629 07/30/19 0454 07/31/19 0502  NA 140   < > 138 138 133* 135 137  K 4.1   < > 3.8 4.4 4.4 4.5 4.6  CL 102   < > 100 104 101 101 106  CO2 28   < > 27 24 22 25 24   GLUCOSE 129*   < > 146* 193* 163* 130* 113*  BUN 18   < > 21 21 38* 54* 48*  CREATININE 1.20*   < > 1.20* 1.14* 1.47* 1.66* 1.41*  CALCIUM 8.9   < > 9.0 8.7* 8.7* 9.0 8.8*  MG 2.5*  --   --   --   --   --   --    < > = values in this interval not displayed.   GFR: Estimated Creatinine Clearance: 32.4 mL/min (A) (by C-G formula based on SCr of 1.41 mg/dL (H)). Liver Function Tests: Recent Labs  Lab 07/24/19 1056 07/27/19 1529  AST 27 28  ALT 31 27  ALKPHOS 65 65  BILITOT 0.9 1.0  PROT 6.4* 6.6  ALBUMIN 3.8 3.8   No results for input(s): LIPASE, AMYLASE in the last 168 hours. No results for input(s): AMMONIA in the last 168 hours. Coagulation Profile: Recent Labs  Lab 07/27/19 1529  INR 1.9*   Cardiac  Enzymes: No results for input(s): CKTOTAL, CKMB, CKMBINDEX, TROPONINI in the last 168 hours. BNP (last 3 results) No results for input(s): PROBNP in the last 8760 hours. HbA1C: No results for input(s): HGBA1C in the last 72 hours. CBG: No results for input(s): GLUCAP in the last 168 hours. Lipid Profile: No results for input(s): CHOL, HDL, LDLCALC, TRIG, CHOLHDL, LDLDIRECT in the last 72 hours. Thyroid Function Tests: Recent Labs    07/30/19 1150 07/30/19  1230  TSH  --  0.165*  FREET4 1.25*  --   T3FREE 1.9*  --    Anemia Panel: No results for input(s): VITAMINB12, FOLATE, FERRITIN, TIBC, IRON, RETICCTPCT in the last 72 hours. Sepsis Labs: Recent Labs  Lab 07/27/19 1709 07/27/19 1716 07/27/19 1925 07/28/19 0710 07/29/19 0629  PROCALCITON  --  <0.10  --  <0.10 <0.10  LATICACIDVEN 2.2*  --  1.3  --   --     Recent Results (from the past 240 hour(s))  Respiratory Panel by RT PCR (Flu A&B, Covid) - Nasopharyngeal Swab     Status: None   Collection Time: 07/27/19  3:45 PM   Specimen: Nasopharyngeal Swab  Result Value Ref Range Status   SARS Coronavirus 2 by RT PCR NEGATIVE NEGATIVE Final    Comment: (NOTE) SARS-CoV-2 target nucleic acids are NOT DETECTED. The SARS-CoV-2 RNA is generally detectable in upper respiratoy specimens during the acute phase of infection. The lowest concentration of SARS-CoV-2 viral copies this assay can detect is 131 copies/mL. A negative result does not preclude SARS-Cov-2 infection and should not be used as the sole basis for treatment or other patient management decisions. A negative result may occur with  improper specimen collection/handling, submission of specimen other than nasopharyngeal swab, presence of viral mutation(s) within the areas targeted by this assay, and inadequate number of viral copies (<131 copies/mL). A negative result must be combined with clinical observations, patient history, and epidemiological information.  The expected result is Negative. Fact Sheet for Patients:  PinkCheek.be Fact Sheet for Healthcare Providers:  GravelBags.it This test is not yet ap proved or cleared by the [place] FDA and  has been authorized for detection and/or diagnosis of SARS-CoV-2 by FDA under an Emergency Use Authorization (EUA). This EUA will remain  in effect (meaning this test can be used) for the duration of the COVID-19 declaration under Section 564(b)(1) of the Act, 21 U.S.C. section 360bbb-3(b)(1), unless the authorization is terminated or revoked sooner.    Influenza A by PCR NEGATIVE NEGATIVE Final   Influenza B by PCR NEGATIVE NEGATIVE Final    Comment: (NOTE) The Xpert Xpress SARS-CoV-2/FLU/RSV assay is intended as an aid in  the diagnosis of influenza from Nasopharyngeal swab specimens and  should not be used as a sole basis for treatment. Nasal washings and  aspirates are unacceptable for Xpert Xpress SARS-CoV-2/FLU/RSV  testing. Fact Sheet for Patients: PinkCheek.be Fact Sheet for Healthcare Providers: GravelBags.it This test is not yet approved or cleared by the [place] FDA and  has been authorized for detection and/or diagnosis of SARS-CoV-2 by  FDA under an Emergency Use Authorization (EUA). This EUA will remain  in effect (meaning this test can be used) for the duration of the  Covid-19 declaration under Section 564(b)(1) of the Act, 21  U.S.C. section 360bbb-3(b)(1), unless the authorization is  terminated or revoked. Performed at [facility], [address] [place], [place] [address]   Blood Culture (routine x 2)     Status: None (Preliminary result)   Collection Time: 07/27/19  5:20 PM   Specimen: BLOOD RIGHT HAND  Result Value Ref Range Status   Specimen Description BLOOD RIGHT HAND  Final   Special Requests   Final    BOTTLES DRAWN AEROBIC AND ANAEROBIC  Blood Culture adequate volume   Culture   Final    NO GROWTH 4 DAYS Performed at [facility], [address], [place], [place] [address]    Report Status PENDING  Incomplete  Blood Culture (routine x 2)     Status: None (Preliminary result)   Collection Time: 07/27/19  5:20 PM   Specimen: Left Antecubital; Blood  Result Value Ref Range Status   Specimen Description LEFT ANTECUBITAL  Final   Special Requests   Final    BOTTLES DRAWN AEROBIC AND ANAEROBIC Blood Culture adequate volume   Culture   Final    NO GROWTH 4 DAYS Performed at [facility], [address], [place], [place] [address]    Report Status PENDING  Incomplete  Urine culture     Status: Abnormal   Collection Time: 07/27/19  6:35 PM   Specimen: In/Out Cath Urine  Result Value Ref Range Status   Specimen Description   Final    IN/OUT CATH URINE Performed at [facility], [address], [place], [place] [address]    Special Requests   Final    NONE Performed at [facility], [address], [place], [place] [address]    Culture 50,000 COLONIES/mL ENTEROCOCCUS FAECALIS (A)  Final   Report Status 07/30/2019 FINAL  Final   Organism ID, Bacteria ENTEROCOCCUS FAECALIS (A)  Final      Susceptibility   Enterococcus faecalis - MIC*    AMPICILLIN <=2 SENSITIVE Sensitive     NITROFURANTOIN <=16 SENSITIVE Sensitive     VANCOMYCIN 2 SENSITIVE Sensitive     * 50,000 COLONIES/mL ENTEROCOCCUS FAECALIS  MRSA PCR Screening     Status: None   Collection Time: 07/29/19  6:03 AM   Specimen: Nasal Mucosa; Nasopharyngeal  Result Value Ref Range Status   MRSA by PCR NEGATIVE NEGATIVE Final    Comment:        The GeneXpert MRSA Assay (FDA approved for NASAL specimens only), is one component of a comprehensive MRSA colonization surveillance program. It is not intended to diagnose MRSA infection nor to guide or monitor treatment for MRSA infections. Performed at [facility], [address], [place], [place] [address]           Radiology Studies: DG Chest 2 View  Result Date: 07/31/2019 CLINICAL DATA:  Severe shortness of breath, COPD, CHF, former smoker, atrial fibrillation EXAM: CHEST - 2 VIEW COMPARISON:  07/28/2019 FINDINGS: Enlargement of cardiac silhouette post median sternotomy, MVR, and LEFT atrial appendage clipping. Mediastinal contours and pulmonary vascularity normal. Atherosclerotic calcification aorta. RIGHT pleural effusion and basilar atelectasis slightly increased. Remaining lungs clear. No pleural effusion or pneumothorax. Bones demineralized. IMPRESSION: Enlargement of cardiac silhouette post surgical changes. Increased RIGHT basilar atelectasis and pleural effusion. Electronically Signed   By: Lavonia Dana M.D.   On: 07/31/2019 09:44        Scheduled Meds: . apixaban  5 mg Oral BID  . atorvastatin  40 mg Oral q1800  . busPIRone  10 mg Oral BID  . diltiazem  60 mg Oral Q6H  . DULoxetine  20 mg Oral Daily  . feeding supplement (ENSURE ENLIVE)  237 mL Oral Q24H  . ferrous sulfate  325 mg Oral Q breakfast  . fluticasone furoate-vilanterol  1 puff Inhalation Daily  . metoprolol succinate  25 mg Oral Daily  . multivitamin with minerals  1 tablet Oral Daily   Continuous Infusions: . ceFEPime (MAXIPIME) IV    . sodium chloride       LOS: 4 days    Time spent: 30 minutes    [place] Darleen Crocker, DO [facility] Pager [phone]  If 7PM-7AM, please contact night-coverage [email or web address] Password [facility] 07/31/2019, 10:12  AM

## 2019-07-31 NOTE — Progress Notes (Signed)
Physical Therapy Treatment Patient Details Name: Nicole [name] MRN: [id number] DOB: 1935/09/12 Today's Date: 07/31/2019    History of Present Illness Nicole [name] is a 84 y.o. female with a history of COPD, diastolic heart failure, atrial fibrillation on anticoagulation, CML with thrombocytosis.  She was recently admitted about a month ago for similar complaints.  She DU with A. fib and RVR.  She did complete doxycycline for total of 5 days for bronchitis/bronchiectasis.  Today, she presents with 1 week of worsening shortness of breath which has been worsening and is now severe.  Symptoms are worse with ambulation and improved with rest.  No other palliating or provoking factors.  Have mild cough.  She had been prescribed doxycycline and prednisone by her oncology physician, although she has not been get these medications.    PT Comments    Patient able to transition to seated from EOB without physical assist. She becomes very SOB upon sitting which decreases with time. She is cued and educated on pursed lip breathing which she performs a few reps of and her O2 sats improve but she stopped because she "doesn't like breathing like that". O2 sat is monitored throughout session, >97% on 2L at rest, 95% standing on room air, 91% ambulating 4 feet on RA while severely SOB. She is able to complete seated exercises and is educated on completing for remainder of stay in hospital. She requires min assist with RW to transfer to standing and use of RW to ambulate. She becomes very SOB and weak following ambulating 4 feet very slowly and is returned to bed. RN made aware of performance and O2 sats. Patient will benefit from continued physical therapy in hospital and recommended venue below to increase strength, balance, endurance for safe ADLs and gait.   Follow Up Recommendations  SNF;Supervision for mobility/OOB;Supervision - Intermittent     Equipment Recommendations  None recommended by PT    Recommendations  for Other Services       Precautions / Restrictions Precautions Precautions: Fall Restrictions Weight Bearing Restrictions: No    Mobility  Bed Mobility Overal bed mobility: Needs Assistance Bed Mobility: Supine to Sit;Sit to Supine     Supine to sit: Supervision;HOB elevated Sit to supine: HOB elevated;Supervision   General bed mobility comments: increased time, labored movement, required use of bed rail  Transfers Overall transfer level: Needs assistance Equipment used: Rolling walker (2 wheeled) Transfers: Sit to/from [facility] Sit to Stand: Min assist Stand pivot transfers: Min assist       General transfer comment: slow labored movement  Ambulation/Gait Ambulation/Gait assistance: Min assist Gait Distance (Feet): 8 Feet Assistive device: Rolling walker (2 wheeled) Gait Pattern/deviations: Decreased step length - right;Decreased step length - left;Decreased stride length Gait velocity: decreased   General Gait Details: slow labored cadence with occasional standing rest breaks due to difficulty breathing, on room air with SpO2 between 90-92%, limited secondary to c/o fatigue, patient very SOB throughout ambulation   Stairs             Wheelchair Mobility    Modified Rankin (Stroke Patients Only)       Balance Overall balance assessment: Needs assistance Sitting-balance support: Feet supported;No upper extremity supported Sitting balance-Leahy Scale: Good Sitting balance - Comments: seated at EOB   Standing balance support: During functional activity;Bilateral upper extremity supported Standing balance-Leahy Scale: Fair Standing balance comment: using RW  Cognition Arousal/Alertness: Awake/alert Behavior During Therapy: WFL for tasks assessed/performed Overall Cognitive Status: Within Functional Limits for tasks assessed                                        Exercises  General Exercises - Lower Extremity Ankle Circles/Pumps: AROM;Both;20 reps;Seated Long Arc Quad: AROM;Both;20 reps;Seated Hip Flexion/Marching: AROM;Both;20 reps;Seated Toe Raises: AROM;Both;Seated Heel Raises: AROM;Both;20 reps;Seated    General Comments        Pertinent Vitals/Pain Pain Assessment: No/denies pain    Home Living                      Prior Function            PT Goals (current goals can now be found in the care plan section) Acute Rehab PT Goals Patient Stated Goal: return home with friends/family to assist PT Goal Formulation: With patient Time For Goal Achievement: 08/13/19 Potential to Achieve Goals: Good Progress towards PT goals: Progressing toward goals    Frequency    Min 3X/week      PT Plan Current plan remains appropriate    Co-evaluation              AM-PAC PT "6 Clicks" Mobility   Outcome Measure  Help needed turning from your back to your side while in a flat bed without using bedrails?: None Help needed moving from lying on your back to sitting on the side of a flat bed without using bedrails?: A Little Help needed moving to and from a bed to a chair (including a wheelchair)?: A Little Help needed standing up from a chair using your arms (e.g., wheelchair or bedside chair)?: A Little Help needed to walk in hospital room?: A Little Help needed climbing 3-5 steps with a railing? : A Lot 6 Click Score: 18    End of Session Equipment Utilized During Treatment: Oxygen Activity Tolerance: Patient tolerated treatment well;Patient limited by fatigue Patient left: with call bell/phone within reach;in bed;with bed alarm set Nurse Communication: Mobility status PT Visit Diagnosis: Unsteadiness on feet (R26.81);Other abnormalities of gait and mobility (R26.89);Muscle weakness (generalized) (M62.81)     Time: [id number] PT Time Calculation (min) (ACUTE ONLY): 32 min  Charges:  $Therapeutic Exercise: 8-22 mins $Therapeutic  Activity: 8-22 mins                     3:09 PM, 07/31/19 Mearl Latin PT, DPT Physical Therapist at [facility]

## 2019-07-31 NOTE — Consult Note (Addendum)
Cardiology Consult    Patient ID: Nicole [name]; [id number]; 1935/07/14   Admit date: 07/27/2019 Date of Consult: 07/31/2019  Primary Care Provider: Frances Maywood, FNP Primary Cardiologist: [name], MD   Patient Profile    Nicole [name] is a 84 y.o. female with past medical history of permanent atrial fibrillation (failed prior DCCV, developed hyperthyroidism and junctional rhythm with Amiodarone in the past --> rate-control strategy pursued), valvular heart disease (s/p tissue AVR in 2014 with MV ring and maze procedure), chronic diastolic CHF, COPD, hyperthyroidism, HLD and CML with thrombocytosis who is being seen today for the evaluation of atrial fibrillation with RVR at the request of Dr. Manuella Ghazi.   History of Present Illness    Nicole [name] was admitted to [facility] in 06/2019 for acute hypoxic respiratory failure in the setting of a COPD exacerbation and atrial fibrillation with RVR. She was continued on PTA Cardizem CD 240mg  daily and received IV Lopressor which was transitioned to Toprol-XL 25mg  daily prior to discharge. A follow-up echo was obtained during admission and showed a preserved EF of 55-60% with no regional wall motion abnormalities. RV function was mildly reduced with severe biatrial dilation. Evidence of mitral valve repair noted with trivial MR and aortic valve demonstrated normal function with no perivalvular leak.   She did follow-up with Dr. Aundra Dubin on 07/16/2019 and reported worsening fatigue and dyspnea. She had self-increased her Torsemide to 80mg  daily and weight was at 183 lbs. Toprol-XL was increased to 25mg  BID for improved rate control and she was informed to increase Torsemide to 80mg  in AM/40mg  in PM for 3 days then 80mg  in AM and 40mg  every other afternoon. She did follow-up with Oncology and was started in [facility] for her CML.   She presented to [facility] ED on 07/27/2019 for evaluation of worsening dyspnea. Initial labs showed WBC 18.3, Hgb 9.0, platelets  2257, Na+ 138, K+ 3.8 and creatinine 1.20. BNP 590. HS Troponin 17. Respiratory Panel negative for COVID and Influenza. Lactic Acid 2.2. CXR showed cardiomegaly with mild central congestion and trace effusions along with right basilar opacity thought to represent PNA or atelectasis. Initial EKG showed baseline artifact but appear most consistent with atrial fibrillation with RVR, HR 124 with PVC's. No distinct ST changes when compared to prior tracings but interpretation is limited due to artifact.   She was admitted for HCAP and started on Cefepime and Vancomycin. She initially received IVF on admission but these were discontinued on 2/27 and she was restarted on PO Torsemide 20mg  BID and received IV Lasix 80mg  for one dose on 2/27. Diuretics currently held given her worsening renal function (creatinine peaked at 1.66, improved to 1.41 this AM). Daily weights nor I&O's have been recorded.  Hgb has declined to 7.6.  In regards to her HR controlling medications, Cardizem CD was held on 2/27 and administered later in the day due to hypotension. Was held yesterday morning due to hypotension and she was later started on short-acting Cardizem 60mg  Q6H. Only received once daily dosing of her Toprol-XL since admission due to hypotension as well (had been on BID dosing prior to admission). By review of telemetry, rates have been in the 90's to 120's over the past 24 hours.   She reports still feeling short of breath. Was aware of her elevated HR yesterday and reports palpitations at that time but denies any current symptoms. No chest pain. She denies any orthopnea or PND. Did not sleep well last night due  to noise in the hall.   Past Medical History:  Diagnosis Date  . Anxiety   . Atrial fibrillation ([place])   . COPD (chronic obstructive pulmonary disease) ([place])   . Diastolic CHF ([place])   . Hyperlipidemia   . SOB (shortness of breath)    conplicated by underlying a fib with RVR,COPD, and CHF  . Valvular heart  disease    a.s/p tissue AVR in 2014 with MV ring and maze procedure    Past Surgical History:  Procedure Laterality Date  . ABDOMINAL HYSTERECTOMY    . AORTIC VALVE REPLACEMENT N/A 03/05/2013   Procedure: AORTIC VALVE REPLACEMENT (AVR);  Surgeon: Gaye Pollack, MD;  Location: [place];  Service: Open Heart Surgery;  Laterality: N/A;  . BREAST BIOPSY     x3  . CARDIOVERSION N/A 02/13/2013   Procedure: CARDIOVERSION;  Surgeon: [name], MD;  Location[facility] ENDOSCOPY;  Service: Cardiovascular;  Laterality: N/A;  . CARDIOVERSION N/A 03/01/2013   Procedure: CARDIOVERSION;  Surgeon: [name], MD;  Location: [facility] ENDOSCOPY;  Service: Cardiovascular;  Laterality: N/A;  . CARDIOVERSION N/A 06/23/2016   Procedure: CARDIOVERSION;  Surgeon: [name], MD;  Location: [place];  Service: Cardiovascular;  Laterality: N/A;  . CATARACT EXTRACTION    . COLONOSCOPY WITH PROPOFOL N/A 06/24/2017   Procedure: COLONOSCOPY WITH PROPOFOL;  Surgeon: Carol Ada, MD;  Location: [place];  Service: Endoscopy;  Laterality: N/A;  . ESOPHAGOGASTRODUODENOSCOPY N/A 06/24/2017   Procedure: ESOPHAGOGASTRODUODENOSCOPY (EGD);  Surgeon: Carol Ada, MD;  Location: [place];  Service: Endoscopy;  Laterality: N/A;  . INTRAOPERATIVE TRANSESOPHAGEAL ECHOCARDIOGRAM N/A 03/05/2013   Procedure: INTRAOPERATIVE TRANSESOPHAGEAL ECHOCARDIOGRAM;  Surgeon: Gaye Pollack, MD;  Location: [place] [facility];  Service: Open Heart Surgery;  Laterality: N/A;  . LEFT AND RIGHT HEART CATHETERIZATION WITH CORONARY ANGIOGRAM N/A 03/02/2013   Procedure: LEFT AND RIGHT HEART CATHETERIZATION WITH CORONARY ANGIOGRAM;  Surgeon: [name], MD;  Location: [facility] CATH LAB;  Service: Cardiovascular;  Laterality: N/A;  . MAZE N/A 03/05/2013   Procedure: MAZE;  Surgeon: Gaye Pollack, MD;  Location: [place];  Service: Open Heart Surgery;  Laterality: N/A;  . MITRAL VALVE REPAIR N/A 03/05/2013   Procedure: MITRAL VALVE REPAIR (MVR);  Surgeon: Gaye Pollack, MD;  Location: [place];  Service: Open Heart Surgery;  Laterality: N/A;  . ROTATOR CUFF REPAIR    . TEE WITHOUT CARDIOVERSION N/A 03/01/2013   Procedure: TRANSESOPHAGEAL ECHOCARDIOGRAM (TEE);  Surgeon: [name], MD;  Location: [place];  Service: Cardiovascular;  Laterality: N/A;  talked to bev. booking no. is [id number] called trish to verify time  . TONSILLECTOMY       Home Medications:  Prior to Admission medications   Medication Sig Start Date End Date Taking? Authorizing Provider  albuterol (VENTOLIN HFA) 108 (90 Base) MCG/ACT inhaler Inhale 2 puffs into the lungs as needed.  05/09/19  Yes [provider]  atorvastatin (LIPITOR) 40 MG tablet TAKE 1 TABLET BY MOUTH EVERY DAY IN THE EVENING 03/01/19  Yes McLean, Elby Showers, MD  BREO ELLIPTA 100-25 MCG/INH AEPB Inhale 1 puff into the lungs daily. 06/20/19  Yes [provider]  busPIRone (BUSPAR) 10 MG tablet Take 10 mg by mouth 2 (two) times daily as needed. 07/23/19  Yes [provider]  diltiazem (CARDIZEM CD) 240 MG 24 hr capsule TAKE 1 CAPSULE BY MOUTH EVERY DAY 03/01/19  Yes [name], MD  DULoxetine (CYMBALTA) 20 MG capsule Take 1 capsule (20  mg total) by mouth daily. 06/24/17  Yes Nedrud, Larena Glassman, MD  ELIQUIS 5 MG TABS tablet TAKE 1 TABLET BY MOUTH TWICE A DAY 03/21/19  Yes [name], MD  ergocalciferol (VITAMIN D2) 1.25 MG (50000 UT) capsule Take 1 capsule (50,000 Units total) by mouth once a week. 07/09/19  Yes Derek Jack, MD  ferrous sulfate 325 (65 FE) MG EC tablet Take 325 mg by mouth daily with breakfast.   Yes [provider]  imatinib (GLEEVEC) 100 MG tablet Take 1 tablet (100 mg total) by mouth daily. Take with meals and large glass of water.Caution:Chemotherapy 07/24/19  Yes Derek Jack, MD  metoprolol succinate (TOPROL-XL) 25 MG 24 hr tablet Take 1 tablet (25 mg total) by mouth 2 (two) times daily. 07/16/19  Yes [name], MD  Multiple  Vitamins-Minerals (MULTIVITAMIN WITH MINERALS) tablet Take 1 tablet by mouth daily.   Yes [provider]  naproxen sodium (ALEVE) 220 MG tablet Take 220 mg by mouth daily as needed (for pain).   Yes [provider]  potassium chloride SA (KLOR-CON M20) 20 MEQ tablet Take 2 tablets (40 mEq total) by mouth 2 (two) times daily. 07/17/19  Yes [name], MD  prochlorperazine (COMPAZINE) 10 MG tablet Take 1 tablet (10 mg total) by mouth every 6 (six) hours as needed for nausea or vomiting. 07/24/19  Yes Derek Jack, MD  torsemide (DEMADEX) 20 MG tablet Take 4 tablets by mouth every morning and 2 tablets by mouth every other afternoon 07/16/19  Yes [name], MD    Inpatient Medications: Scheduled Meds: . apixaban  5 mg Oral BID  . atorvastatin  40 mg Oral q1800  . busPIRone  10 mg Oral BID  . diltiazem  60 mg Oral Q6H  . DULoxetine  20 mg Oral Daily  . feeding supplement (ENSURE ENLIVE)  237 mL Oral Q24H  . ferrous sulfate  325 mg Oral Q breakfast  . fluticasone furoate-vilanterol  1 puff Inhalation Daily  . metoprolol succinate  25 mg Oral Daily  . multivitamin with minerals  1 tablet Oral Daily   Continuous Infusions: . ceFEPime (MAXIPIME) IV    . sodium chloride     PRN Meds: acetaminophen, albuterol, ALPRAZolam, levalbuterol, ondansetron (ZOFRAN) IV, prochlorperazine  Allergies:   No Known Allergies  Social History:   Social History   Socioeconomic History  . Marital status: Divorced    Spouse name: Not on file  . Number of children: 2  . Years of education: Not on file  . Highest education level: Not on file  Occupational History  . Occupation: retired  Tobacco Use  . Smoking status: Former Smoker    Packs/day: 1.00    Years: 57.00    Pack years: 57.00    Types: Cigarettes    Start date: 06/12/1955    Quit date: 11/28/2012    Years since quitting: 6.6  . Smokeless tobacco: Never Used  . Tobacco comment: Quit in July 2014  Substance  and Sexual Activity  . Alcohol use: No    Alcohol/week: 0.0 standard drinks  . Drug use: No  . Sexual activity: Never  Other Topics Concern  . Not on file  Social History Narrative  . Not on file   Social Determinants of Health   Financial Resource Strain: Low Risk   . Difficulty of Paying Living Expenses: Not hard at all  Food Insecurity: No Food Insecurity  . Worried About Charity fundraiser in the  Last Year: Never true  . Ran Out of Food in the Last Year: Never true  Transportation Needs: No Transportation Needs  . Lack of Transportation (Medical): No  . Lack of Transportation (Non-Medical): No  Physical Activity: Inactive  . Days of Exercise per Week: 0 days  . Minutes of Exercise per Session: 0 min  Stress: No Stress Concern Present  . Feeling of Stress : Not at all  Social Connections: Somewhat Isolated  . Frequency of Communication with Friends and Family: More than three times a week  . Frequency of Social Gatherings with Friends and Family: More than three times a week  . Attends Religious Services: 1 to 4 times per year  . Active Member of Clubs or Organizations: No  . Attends Archivist Meetings: Never  . Marital Status: Divorced  Human resources officer Violence: Not At Risk  . Fear of Current or Ex-Partner: No  . Emotionally Abused: No  . Physically Abused: No  . Sexually Abused: No     Family History:    Family History  Problem Relation Age of Onset  . Breast cancer Other        family history  . Heart disease Mother   . Throat cancer Father   . Heart disease Brother   . Heart attack Son   . Breast cancer Daughter       Review of Systems    General:  No chills, fever, night sweats or weight changes. Positive for fatigue.  Cardiovascular:  No chest pain, edema, orthopnea, palpitations, paroxysmal nocturnal dyspnea. Positive for dyspnea on exertion.  Dermatological: No rash, lesions/masses Respiratory: No cough, dyspnea Urologic: No  hematuria, dysuria Abdominal:   No nausea, vomiting, diarrhea, bright red blood per rectum, melena, or hematemesis Neurologic:  No visual changes, wkns, changes in mental status. All other systems reviewed and are otherwise negative except as noted above.  Physical Exam/Data    Vitals:   07/31/19 0100 07/31/19 0534 07/31/19 0726 07/31/19 0809  BP: 116/68 99/63    Pulse:  (!) 102 85   Resp:  20    Temp:  98.1 F (36.7 C)    TempSrc:  Axillary    SpO2: 94% 97%  96%  Weight:      Height:        Intake/Output Summary (Last 24 hours) at 07/31/2019 0846 Last data filed at 07/31/2019 0538 Gross per 24 hour  Intake 480 ml  Output 750 ml  Net -270 ml   Filed Weights   07/27/19 1506 07/29/19 0552 07/29/19 0744  Weight: 83 kg 85.9 kg 88.3 kg   Body mass index is 34.48 kg/m.   General: Pleasant female appearing in NAD Psych: Normal affect. Neuro: Alert and oriented X 3. Moves all extremities spontaneously. HEENT: Normal  Neck: Supple without bruits. JVD at 8cm. Lungs:  Resp regular and unlabored, scattered rhonchi. Heart: Irregularly irregular. no s3, s4, 2/6 SEM along RUSB Abdomen: Soft, non-tender, non-distended, BS + x 4.  Extremities: No clubbing, cyanosis. Trace lower extremity edema bilaterally. DP/PT/Radials 2+ and equal bilaterally.   EKG:  The EKG was personally reviewed and demonstrates: Baseline artifact but appear most consistent with atrial fibrillation with RVR, HR 124 with PVC's. No distinct ST changes when compared to prior tracings but interpretation is limited due to artifact.   Telemetry:  Telemetry was personally reviewed and demonstrates: Atrial fibrillation, HR in 90's to 120's with occasional PVC's.    Labs/Studies     Relevant CV Studies:  Echocardiogram: 06/2019 IMPRESSIONS    1. Left ventricular ejection fraction, by visual estimation, is 55 to  60%. The left ventricle has normal function. There is no left ventricular  hypertrophy.  2. Left  ventricular diastolic parameters are indeterminate in the setting  of atrial fibrillation.  3. The left ventricle has no regional wall motion abnormalities.  4. Global right ventricle has mildly reduced systolic function.The right  ventricular size is normal. No increase in right ventricular wall  thickness.  5. Left atrial size was severely dilated.  6. Right atrial size was severely dilated.  7. Presence of pericardial fat pad.  8. The mitral valve has been repaired/replaced. Trivial mitral valve  regurgitation.  9. 26 mm Sorin 3-D Memo Ring in position.  10. The tricuspid valve is grossly normal. Tricuspid valve regurgitation  is mild.  11. 21 mm Edwards Magna-ease pericardial valve in aortic position. Grossly  normal function with no perivalvular leak.  12. Aortic valve mean gradient measures 12.3 mmHg.  13. Aortic valve regurgitation is not visualized.  14. The pulmonic valve was grossly normal. Pulmonic valve regurgitation is  not visualized.  15. Moderately elevated pulmonary artery systolic pressure.  16. The inferior vena cava is normal in size with greater than 50%  respiratory variability, suggesting right atrial pressure of 3 mmHg.  17. The tricuspid regurgitant velocity is 3.14 m/s, and with an assumed  right atrial pressure of 3 mmHg, the estimated right ventricular systolic  pressure is moderately elevated at 42.4 mmHg.   Laboratory Data:  Chemistry Recent Labs  Lab 07/29/19 0629 07/30/19 0454 07/31/19 0502  NA 133* 135 137  K 4.4 4.5 4.6  CL 101 101 106  CO2 22 25 24   GLUCOSE 163* 130* 113*  BUN 38* 54* 48*  CREATININE 1.47* 1.66* 1.41*  CALCIUM 8.7* 9.0 8.8*  GFRNONAA 33* 28* 35*  GFRAA 38* 33* 40*  ANIONGAP 10 9 7     Recent Labs  Lab 07/24/19 1056 07/27/19 1529  PROT 6.4* 6.6  ALBUMIN 3.8 3.8  AST 27 28  ALT 31 27  ALKPHOS 65 65  BILITOT 0.9 1.0   Hematology Recent Labs  Lab 07/29/19 0629 07/30/19 0454 07/31/19 0502  WBC 29.2*  23.0* 15.6*  RBC 3.05* 3.02* 2.94*  HGB 7.8* 7.7* 7.6*  HCT 27.5* 26.8* 26.1*  MCV 90.2 88.7 88.8  MCH 25.6* 25.5* 25.9*  MCHC 28.4* 28.7* 29.1*  RDW 17.4* 17.7* 17.6*  PLT 2,025* 2,077* 1,851*   Cardiac EnzymesNo results for input(s): TROPONINI in the last 168 hours. No results for input(s): TROPIPOC in the last 168 hours.  BNP Recent Labs  Lab 07/27/19 1529 07/28/19 1600 07/30/19 0454  BNP 590.0* 851.0* 822.0*    DDimer No results for input(s): DDIMER in the last 168 hours.  Radiology/Studies:  DG Chest 1 View  Result Date: 07/28/2019 CLINICAL DATA:  Dyspnea. Large epigastric hernia. EXAM: CHEST  1 VIEW COMPARISON:  07/27/2019 FINDINGS: Signs of median sternotomy and aortic valve replacement with left atrial clipping. Marked cardiomegaly. Low lung volumes. Blunting of right costodiaphragmatic sulcus. Left and right hemidiaphragm are obscured. Study limited by patient body habitus. No acute bone finding. IMPRESSION: Low lung volumes with bibasilar atelectasis and/or infiltrates. Marked cardiomegaly without signs of failure but with potential pulmonary vascular congestion. Signs of aortic valve replacement and left atrial clipping. Electronically Signed   By: Zetta Bills M.D.   On: 07/28/2019 18:02   DG Chest Port 1 View  Result Date: 07/27/2019 CLINICAL DATA:  Shortness of breath and wheezing EXAM: PORTABLE CHEST 1 VIEW COMPARISON:  06/24/2019, 02/09/2017 FINDINGS: Post sternotomy changes. Appendage clip. Trace pleural effusions. Enlarged cardiomediastinal silhouette with central vascular congestion. Increased hazy opacity at the right base. Aortic atherosclerosis. No pneumothorax. IMPRESSION: 1. Cardiomegaly with mild central congestion and trace pleural effusions. 2. Slight interval increase in hazy right basilar opacity, atelectasis versus mild pneumonia. Electronically Signed   By: Donavan Foil M.D.   On: 07/27/2019 15:44     Assessment & Plan    1. Atrial Fibrillation with  RVR - She has permanent atrial fibrillation and had failed prior DCCV and was intolerant to Amiodarone in the past due to hyperthyroidism and junctional rhythm. A rate-control strategy has been pursued and rates were initially in the 120's on admission but she developed worsening tachycardia yesterday in the setting of her PTA Cardizem CD and Toprol-XL being held due to hypotension. - She has now been started on short acting Cardizem 60 mg every 6 hours along with Toprol-XL 25 mg daily and rates are currently in the 90's to low 100's. SBP soft in the 90's. Given her hypotension, would continue short-acting Cardizem for now and transition to Cardizem CD once BP stabilizes. Not an ideal candidate for Digoxin given her variable renal function.  - remains on Eliquis 5mg  BID for anticoagulation. She denies any evidence of active bleeding. Hgb is down to 7.6 this AM and suspect her anemia is contributing to her tachycardia as well.   2. Acute on Chronic Diastolic CHF Exacerbation - BNP elevated to 822 on 3/1 and repeat CXR has been ordered for today. She has mildly elevated JVD on examination but is not markedly volume overloaded. Will ask for daily weights along with I&O's to be obtained as weight was 183 lbs at the time of her office visit on 2/15 and elevated to 194 lbs on 2/28 but she does not appear 11 lbs above baseline by examination.  3. Valvular Heart Disease  - s/p tissue AVR in 2014 with MV ring and maze procedure. Echocardiogram last admission showed a preserved EF of 55-60% with evidence of mitral valve repair noted with trivial MR and aortic valve demonstrated normal function with no perivalvular leak.   4. AKI - baseline creatinine 1.0 - 1.1. At 1.20 on admission, peaking at 1.66. Improving to 1.41 today with IV diuretics held as outlined above.   5. COPD Exacerbation - further management per admitting team.   6. CML with thrombocytosis - followed by Oncology as an outpatient. Recently  started on [place].    For questions or updates, please contact [place] Please consult [email or web address] for contact info under Cardiology/STEMI.  Signed, Erma Heritage, PA-C 07/31/2019, 8:46 AM Pager: [phone]  Patient seen and discussed with PA Ahmed Prima, I agree with her documentation above. 84 yo female history of afib, cOPD, AVR with tissue valve, MV ring, chronic diastolic HF, CML.   From afib standpoing on amio developed subclinical hyperthyroid, amio was stopped. Subsequently had DCCV but went back into afib. Currently undergoing rate control strategy.   Admitted 07/27/19 with SOB. Managed for HCAP by the primary team. Has had some issues with diastolic HF this admission, has been diuresed by primary team.     WBC 15.6 Hgb 7.6 Plt 1851 822 BUN 48 Cr 1.41 CXR cardiomegaly, no overt failure but potental congestion Jan 2021 echo LVEF 55-60%, mild RV dysfunction, severe BAE, trival MR, normal AVR EKG afib   Regarding afib she has been on  dilt 240 and toprol 25mg  bid, eliquis 5mg  bid at home. Some elevated rates this admit. Currently on dilt 60mg  every 6 hrs, toprol 25. Soft bp's at times, rates low 100s. Continue current therapy, suspect bp's will improve off diuretics and will be able to titrate back to her prior home regimen. Limited options give her reaction to amio combined with current AKI.    Acute on chronic diastolic HF, has had some diuresis this admission. Cr up to 1.4 today, baseline around 0.9. Hold diuretics today.    Patient with AKI, baseline Cr around 0.9. Admitted with Cr 1.2, with fluids down to 1.1. On the morning of the 27th got torsemide 20mg  bid and also lasix 80mg  x 1, Cr up to 1.47 and then 1.66 on March 1st. Soft bp's on admit which seemed to worsen on 27th. Off diuretics Cr trending back down, continue to hold.Suspect intravasculary depleted.      Pneumonia management per prmiary team, severe leukocytosis on admission resolving. Perhaps some mild  peripheral vasodiltation related to infection is playing a role in her low bp's as well.  Carlyle Dolly MD

## 2019-07-31 NOTE — Progress Notes (Signed)
OT Cancellation Note  Patient Details Name: Nicole [name] MRN: [id number] DOB: 1936/03/06   Cancelled Treatment:    Reason Eval/Treat Not Completed: Patient declined, no reason specified. Pt initially agreeable to OT evaluation, reporting she is independent in all ADLs at home. When asked to sit at EOB pt refused stating she was very tired and to just let her sleep. Pt then asked to sit up for coffee but then changed her mind. Will check back at a later time when pt is agreeable to participate in evaluation.    Guadelupe Sabin, OTR/L  [phone] 07/31/2019, 7:56 AM

## 2019-07-31 NOTE — TOC Progression Note (Signed)
Transition of Care [facility]) - Progression Note    Patient Details  Name: Nicole [name] MRN: [id number] Date of Birth: April 07, 1936  [facility] CM/SW Contact  Shade Flood, LCSW Phone Number: 07/31/2019, 12:53 PM  Clinical Narrative:     TOC following. Per MD, he is anticipating dc tomorrow or Thursday depending on her clinical improvement. Updated Kristie at [facility] and they can accept her either day as long as COVID test has resulted negative within 48 hours of the time of collection and time of pt's arrival at the facility.  Will follow up tomorrow.  Expected Discharge Plan: [place] Barriers to Discharge: Continued Medical Work up  Expected Discharge Plan and Services Expected Discharge Plan: [place] arrangements for the past 2 months: Apartment                                       Social Determinants of Health (SDOH) Interventions    Readmission Risk Interventions No flowsheet data found.

## 2019-07-31 NOTE — Progress Notes (Signed)
NAME:  Nicole [name], MRN:  [id number], DOB:  [birth year], LOS: 4 ADMISSION DATE:  07/27/2019, CONSULTATION DATE:  07/30/2019 REFERRING MD:  Dr. Manuella Ghazi, [facility], CHIEF COMPLAINT:  Short of breath  Brief History   84 yo female former smoker presented with worsening dyspnea and cough.  Had recent course of doxycycline and prednisone as outpt prior to admission.  Admitted with recurrent COPD exacerbation and HCAP.  History of present illness   She was in hospital recently with A fib and CHF.  Has noticed more trouble with her breathing since.  She was started on ABx for possible HCAP.  She has been getting diuretics.  She has been using breo.  She feels this helps, but concerned about expense.  Transitioned to room air at rest, but was told by PT that she would need supplemental oxygen with activity.  Leg swelling has gotten better.  Has cough with beige colored sputum.  Denies sinus congestion, sore throat, dysphagia, hemoptysis, chest pain, nausea.  Gets intermittent discomfort in abdomen from large ventral hernia, but these symptoms usually don't last long and resolve spontaneously.  Past Medical History  COPD, A fib, CML with thrombocytosis, Chronic diastolic CHF, severe AS and moderate MR s/p bioprosthetic AVR/MV ring/MAZE 2014, Amiodarone induced hyperthyroidism  Significant Hospital Events   2/26 Admit  Consults:    Procedures:    Significant Diagnostic Tests:  PFT 10/30/13 >> FEV1 1.34 (68%), FEV1% 73, TLC 3.60 (73%), DLCO 50%, +BD Echo 06/25/19 >> EF 55 to [id number], mild RV systolic dysfx, severe LA and RA dilation, s/p MVR, moderately elevated PASP CT angio chest 06/24/19 >> atherosclerosis, clipping of LA, 4.6 x 3.2 Lt thyroid enlargement, tiny pleural effusions, kyphosis, large ventral hernia  Micro Data:  SARS CoV2 PCR 2/26 >> negative Influenza PCR 2/26 >> negative Pneumococcal Ag 2/26 >> negative  Antimicrobials:  Vancomycin 2/26 >> 2/27 Cefepime 2/26 >>   Interim history/subjective:   Had trouble sleeping last night.  Too much noise in hallway.  Breathing better.  Denies chest pain.  Objective   Blood pressure 120/87, pulse (!) 104, temperature (!) 97.3 F (36.3 C), temperature source Oral, resp. rate 20, height 5\' 3"  (1.6 m), weight 88.3 kg, SpO2 97 %.        Intake/Output Summary (Last 24 hours) at 07/31/2019 1357 Last data filed at 07/31/2019 0850 Gross per 24 hour  Intake 480 ml  Output 1450 ml  Net -970 ml   Filed Weights   07/27/19 1506 07/29/19 0552 07/29/19 0744  Weight: 83 kg 85.9 kg 88.3 kg    Examination:  General - alert Eyes - pupils reactive ENT - no sinus tenderness, no stridor Cardiac - irregular, 2/6 SM Chest - faint rhonchi, no wheeze Abdomen - soft, non tender, + bowel sounds Extremities - no cyanosis, clubbing, or edema Skin - no rashes Neuro - normal strength, moves extremities, follows commands Psych - normal mood and behavior  Discussion:  84 yo female smoker with multifactorial dyspnea: 1) COPD exacerbation with hx of COPD and positive bronchodilator response, 2) possible HCAP, 3) Acute on chronic diastolic CHF with hx of A fib and valvular heart disease, 4) restrictive lung disease from kyphosis, obesity and ventral hernia, 5) anemia, 6) deconditioning.  It seems that most of her issues at present are chronic in nature rather than an acute reversible process.  Assessment & Plan:   COPD exacerbation with hx of COPD and positive bronchodilator response. - completed prednisone taper - continue breo with prn  albuterol - will need to look into less expensive option for LABA/ICS as outpt - assess for home oxygen  Rt atelectasis and effusion. - seen on CXR 3/02 - repeat CXR 3/03 >> if increasing, then consider ultrasound to assess for thoracentesis  Restrictive lung disease. - from kyphosis, large ventral hernia, obesity - weight reduction  Possible HCAP. - plan to d/c ABx after dose on 3/02  Permanent A fib s/p MAZE. Acute  on chronic diastolic CHF. Valvular heart disease s/p bioprosthetic AVR and mitral annuloplasty. Hx of HLD. - continue apixaban, atorvastatin, cardizem CD, toprol XL - cardiology consulted  AKI likely from diuresis. - baseline creatinine 0.93 from 06/25/19 - f/u BMET  Goiter. - f/u with PCP as outpt  Hx of CML with thrombocytosis. Anemia of chronic disease. - in chronic phase - followed by Dr. Delton Coombes with hematology/oncology as outpt - declined bone marrow bx at outpt visit on 2/23/2, and started on Gleevac - f/u CBC  Deconditioning. - PT/OT - recommending SNF  Best practice:  Diet: heart healthy DVT prophylaxis: apixiban GI prophylaxis: not indicated Mobility: OOB Code Status: DNR Disposition: telemetry  Labs    CMP Latest Ref Rng & Units 07/31/2019 07/30/2019 07/29/2019  Glucose 70 - 99 mg/dL 113(H) 130(H) 163(H)  BUN 8 - 23 mg/dL 48(H) 54(H) 38(H)  Creatinine 0.44 - 1.00 mg/dL 1.41(H) 1.66(H) 1.47(H)  Sodium 135 - 145 mmol/L 137 135 133(L)  Potassium 3.5 - 5.1 mmol/L 4.6 4.5 4.4  Chloride 98 - 111 mmol/L 106 101 101  CO2 22 - 32 mmol/L 24 25 22   Calcium 8.9 - 10.3 mg/dL 8.8(L) 9.0 8.7(L)  Total Protein 6.5 - 8.1 g/dL - - -  Total Bilirubin 0.3 - 1.2 mg/dL - - -  Alkaline Phos 38 - 126 U/L - - -  AST 15 - 41 U/L - - -  ALT 0 - 44 U/L - - -    CBC Latest Ref Rng & Units 07/31/2019 07/30/2019 07/29/2019  WBC 4.0 - 10.5 K/uL 15.6(H) 23.0(H) 29.2(H)  Hemoglobin 12.0 - 15.0 g/dL 7.6(L) 7.7(L) 7.8(L)  Hematocrit 36.0 - 46.0 % 26.1(L) 26.8(L) 27.5(L)  Platelets 150 - 400 K/uL 1,851(HH) 2,077(HH) 2,025(HH)    Chesley Mires, MD [place] 07/31/2019, 2:00 PM

## 2019-08-01 ENCOUNTER — Inpatient Hospital Stay (HOSPITAL_COMMUNITY): Payer: Medicare Other

## 2019-08-01 DIAGNOSIS — J441 Chronic obstructive pulmonary disease with (acute) exacerbation: Secondary | ICD-10-CM

## 2019-08-01 DIAGNOSIS — J9601 Acute respiratory failure with hypoxia: Secondary | ICD-10-CM | POA: Diagnosis present

## 2019-08-01 DIAGNOSIS — D473 Essential (hemorrhagic) thrombocythemia: Secondary | ICD-10-CM

## 2019-08-01 DIAGNOSIS — C9210 Chronic myeloid leukemia, BCR/ABL-positive, not having achieved remission: Secondary | ICD-10-CM

## 2019-08-01 DIAGNOSIS — J189 Pneumonia, unspecified organism: Secondary | ICD-10-CM

## 2019-08-01 DIAGNOSIS — Z66 Do not resuscitate: Secondary | ICD-10-CM | POA: Diagnosis present

## 2019-08-01 LAB — BASIC METABOLIC PANEL
Anion gap: 8 (ref 5–15)
BUN: 38 mg/dL — ABNORMAL HIGH (ref 8–23)
CO2: 23 mmol/L (ref 22–32)
Calcium: 8.7 mg/dL — ABNORMAL LOW (ref 8.9–10.3)
Chloride: 106 mmol/L (ref 98–111)
Creatinine, Ser: 1.19 mg/dL — ABNORMAL HIGH (ref 0.44–1.00)
GFR calc Af Amer: 49 mL/min — ABNORMAL LOW (ref >60)
GFR calc non Af Amer: 42 mL/min — ABNORMAL LOW (ref >60)
Glucose, Bld: 115 mg/dL — ABNORMAL HIGH (ref 70–99)
Potassium: 4.3 mmol/L (ref 3.5–5.1)
Sodium: 137 mmol/L (ref 135–145)

## 2019-08-01 LAB — CBC
HCT: 27.2 % — ABNORMAL LOW (ref 36.0–46.0)
Hemoglobin: 7.7 g/dL — ABNORMAL LOW (ref 12.0–15.0)
MCH: 25.5 pg — ABNORMAL LOW (ref 26.0–34.0)
MCHC: 28.3 g/dL — ABNORMAL LOW (ref 30.0–36.0)
MCV: 90.1 fL (ref 80.0–100.0)
Platelets: 1903 10*3/uL (ref 150–400)
RBC: 3.02 MIL/uL — ABNORMAL LOW (ref 3.87–5.11)
RDW: 17.5 % — ABNORMAL HIGH (ref 11.5–15.5)
WBC: 16.7 10*3/uL — ABNORMAL HIGH (ref 4.0–10.5)
nRBC: 0.7 % — ABNORMAL HIGH (ref 0.0–0.2)

## 2019-08-01 LAB — CULTURE, BLOOD (ROUTINE X 2)
Culture: NO GROWTH
Culture: NO GROWTH
Special Requests: ADEQUATE
Special Requests: ADEQUATE

## 2019-08-01 LAB — PREPARE RBC (CROSSMATCH)

## 2019-08-01 LAB — HEMOGLOBIN AND HEMATOCRIT, BLOOD
HCT: 29.7 % — ABNORMAL LOW (ref 36.0–46.0)
Hemoglobin: 8.9 g/dL — ABNORMAL LOW (ref 12.0–15.0)

## 2019-08-01 LAB — SARS CORONAVIRUS 2 (TAT 6-24 HRS): SARS Coronavirus 2: NEGATIVE

## 2019-08-01 LAB — ABO/RH: ABO/RH(D): A POS

## 2019-08-01 LAB — BRAIN NATRIURETIC PEPTIDE: B Natriuretic Peptide: 737 pg/mL — ABNORMAL HIGH (ref 0.0–100.0)

## 2019-08-01 MED ORDER — ORAL CARE MOUTH RINSE
15.0000 mL | Freq: Two times a day (BID) | OROMUCOSAL | Status: DC
  Administered 2019-08-02 – 2019-08-09 (×8): 15 mL via OROMUCOSAL

## 2019-08-01 MED ORDER — FUROSEMIDE 10 MG/ML IJ SOLN
40.0000 mg | Freq: Once | INTRAMUSCULAR | Status: AC
  Administered 2019-08-01: 10:00:00 40 mg via INTRAVENOUS
  Filled 2019-08-01: qty 4

## 2019-08-01 MED ORDER — SODIUM CHLORIDE 0.9% IV SOLUTION
Freq: Once | INTRAVENOUS | Status: AC
  Administered 2019-08-01: 1 mL via INTRAVENOUS

## 2019-08-01 MED ORDER — HYDROXYUREA 500 MG PO CAPS
500.0000 mg | ORAL_CAPSULE | Freq: Every day | ORAL | Status: DC
  Administered 2019-08-01 – 2019-08-02 (×2): 500 mg via ORAL
  Filled 2019-08-01 (×2): qty 1

## 2019-08-01 MED ORDER — FUROSEMIDE 10 MG/ML IJ SOLN
30.0000 mg | Freq: Two times a day (BID) | INTRAMUSCULAR | Status: DC
  Administered 2019-08-02 (×2): 30 mg via INTRAVENOUS
  Filled 2019-08-01 (×3): qty 4

## 2019-08-01 NOTE — Consult Note (Signed)
[name] Consultation Oncology  Name: Nicole [name]      MRN: [id number]    Location: A311/A311-01  Date: 08/01/2019 Time:6:02 PM   REFERRING PHYSICIAN: Dr. Wynetta Emery  REASON FOR CONSULT: Elevated platelet count   DIAGNOSIS: CML in chronic phase  HISTORY OF PRESENT ILLNESS: Ms. ladner is a 84 year old very pleasant white female seen in consultation today at the request of Dr. Wynetta Emery for [facility] in chronic phase.  She was admitted to the [name] on 07/27/2019 with breathing difficulty and was treated for possible healthcare associated pneumonia.  Her daughter [name] is at bedside.  Her COPD exacerbation is waxing and waning.  Today her labs show a white count of 16.7 with hemoglobin of 7.7 and platelet count of 1903.  1 unit of PRBC was ordered.  She was treated with the prednisone taper and is receiving Breo and albuterol.  She is also on apixaban, diltiazem and Toprol-XL.  She has been afebrile.  Denies any blurring of vision, headaches.  Denies any erythromelalgias.  PAST MEDICAL HISTORY:   Past Medical History:  Diagnosis Date  . Anxiety   . Atrial fibrillation ([place])   . COPD (chronic obstructive pulmonary disease) ([place])   . Diastolic CHF ([place])   . Hyperlipidemia   . SOB (shortness of breath)    conplicated by underlying a fib with RVR,COPD, and CHF  . Valvular heart disease    a.s/p tissue AVR in 2014 with MV ring and maze procedure    ALLERGIES: No Known Allergies    MEDICATIONS: I have reviewed the patient's current medications.     PAST SURGICAL HISTORY Past Surgical History:  Procedure Laterality Date  . ABDOMINAL HYSTERECTOMY    . AORTIC VALVE REPLACEMENT N/A 03/05/2013   Procedure: AORTIC VALVE REPLACEMENT (AVR);  Surgeon: Gaye Pollack, MD;  Location: [place];  Service: Open Heart Surgery;  Laterality: N/A;  . BREAST BIOPSY     x3  . CARDIOVERSION N/A 02/13/2013   Procedure: CARDIOVERSION;  Surgeon: [name], MD;  Location: [facility] ENDOSCOPY;  Service:  Cardiovascular;  Laterality: N/A;  . CARDIOVERSION N/A 03/01/2013   Procedure: CARDIOVERSION;  Surgeon: [name], MD;  Location: [name] ENDOS[facility];  Service: Cardiovascular;  Laterality: N/A;  . CARDIOVERSION N/A 06/23/2016   Procedure: CARDIOVERSION;  Surgeon: [name], MD;  Location: [facility] [facility];  Service: Cardiovascular;  Laterality: N/A;  . CATARACT EXTRACTION    . COLONOSCOPY WITH PROPOFOL N/A 06/24/2017   Procedure: COLONOSCOPY WITH PROPOFOL;  Surgeon: Carol Ada, MD;  Location: [facility];  Service: Endoscopy;  Laterality: N/A;  . ESOPHAGOGASTRODUODENOSCOPY N/A 06/24/2017   Procedure: ESOPHAGOGASTRODUODENOSCOPY (EGD);  Surgeon: Carol Ada, MD;  Location: [place];  Service: Endoscopy;  Laterality: N/A;  . INTRAOPERATIVE TRANSESOPHAGEAL ECHOCARDIOGRAM N/A 03/05/2013   Procedure: INTRAOPERATIVE TRANSESOPHAGEAL ECHOCARDIOGRAM;  Surgeon: Gaye Pollack, MD;  Location: [place] [facility];  Service: Open Heart Surgery;  Laterality: N/A;  . LEFT AND RIGHT HEART CATHETERIZATION WITH CORONARY ANGIOGRAM N/A 03/02/2013   Procedure: LEFT AND RIGHT HEART CATHETERIZATION WITH CORONARY ANGIOGRAM;  Surgeon: [name], MD;  Location: [name];  Service: Cardiovascular;  Laterality: N/A;  . MAZE N/A 03/05/2013   Procedure: MAZE;  Surgeon: Gaye Pollack, MD;  Location: [facility];  Service: Open Heart Surgery;  Laterality: N/A;  . MITRAL VALVE REPAIR N/A 03/05/2013   Procedure: MITRAL VALVE REPAIR (MVR);  Surgeon: Gaye Pollack, MD;  Location: [facility];  Service: Open Heart Surgery;  Laterality: N/A;  . ROTATOR  CUFF REPAIR    . TEE WITHOUT CARDIOVERSION N/A 03/01/2013   Procedure: TRANSESOPHAGEAL ECHOCARDIOGRAM (TEE);  Surgeon: [name], MD;  Location: [place];  Service: Cardiovascular;  Laterality: N/A;  talked to bev. booking no. is [id number] called trish to verify time  . TONSILLECTOMY      FAMILY HISTORY: Family History  Problem Relation Age of Onset  . Breast cancer Other         family history  . Heart disease Mother   . Throat cancer Father   . Heart disease Brother   . Heart attack Son   . Breast cancer Daughter     SOCIAL HISTORY:  reports that she quit smoking about 6 years ago. Her smoking use included cigarettes. She started smoking about 64 years ago. She has a 57.00 pack-year smoking history. She has never used smokeless tobacco. She reports that she does not drink alcohol or use drugs.  PERFORMANCE STATUS: The patient's performance status is 3 - Symptomatic, >50% confined to bed  PHYSICAL EXAM: Most Recent Vital Signs: Blood pressure 101/67, pulse (!) 102, temperature 98.1 F (36.7 C), resp. rate 20, height 5\' 3"  (1.6 m), weight 193 lb 2 oz (87.6 kg), SpO2 97 %. BP 101/67 (BP Location: Right Arm)   Pulse (!) 102   Temp 98.1 F (36.7 C)   Resp 20   Ht 5\' 3"  (1.6 m)   Wt 193 lb 2 oz (87.6 kg)   SpO2 97%   BMI 34.21 kg/m  General appearance: alert and cooperative Head: Normocephalic, without obvious abnormality, atraumatic Lungs: Bilateral air entry with crackles at bases. Heart: Irregular rate and rhythm. Extremities: No edema or cyanosis. Skin: Skin color, texture, turgor normal. No rashes or lesions Neurologic: Grossly normal  LABORATORY DATA:  Results for orders placed or performed during the [name] encounter of 07/27/19 (from the past 48 hour(s))  Basic metabolic panel     Status: Abnormal   Collection Time: 07/31/19  5:02 AM  Result Value Ref Range   Sodium 137 135 - 145 mmol/L   Potassium 4.6 3.5 - 5.1 mmol/L   Chloride 106 98 - 111 mmol/L   CO2 24 22 - 32 mmol/L   Glucose, Bld 113 (H) 70 - 99 mg/dL    Comment: Glucose reference range applies only to samples taken after fasting for at least 8 hours.   BUN 48 (H) 8 - 23 mg/dL   Creatinine, Ser 1.41 (H) 0.44 - 1.00 mg/dL   Calcium 8.8 (L) 8.9 - 10.3 mg/dL   GFR calc non Af Amer 35 (L) >60 mL/min   GFR calc Af Amer 40 (L) >60 mL/min   Anion gap 7 5 - 15    Comment: Performed at  [name], [address], [place] [place] [employer], [place] [address]  CBC     Status: Abnormal   Collection Time: 07/31/19  5:02 AM  Result Value Ref Range   WBC 15.6 (H) 4.0 - 10.5 K/uL   RBC 2.94 (L) 3.87 - 5.11 MIL/uL   Hemoglobin 7.6 (L) 12.0 - 15.0 g/dL   HCT 26.1 (L) 36.0 - 46.0 %   MCV 88.8 80.0 - 100.0 fL   MCH 25.9 (L) 26.0 - 34.0 pg   MCHC 29.1 (L) 30.0 - 36.0 g/dL   RDW 17.6 (H) 11.5 - 15.5 %   Platelets 1,851 (HH) 150 - 400 K/uL    Comment: REPEATED TO VERIFY SPECIMEN CHECKED FOR CLOTS CONSISTENT WITH PREVIOUS RESULT THIS CRITICAL RESULT HAS VERIFIED  AND BEEN CALLED TO [facility],C BY SHERRI HUFFINES ON 03 02 2021 AT 0612, AND HAS BEEN READ BACK.     nRBC 0.3 (H) 0.0 - 0.2 %    Comment: Performed at [facility], [address], [place], [place] [address]  Cortisol-am, blood     Status: None   Collection Time: 07/31/19  5:02 AM  Result Value Ref Range   Cortisol - AM 14.0 6.7 - 22.6 ug/dL    Comment: Performed at [name], [place] [address] [place], [place] [id number]  Basic metabolic panel     Status: Abnormal   Collection Time: 08/01/19  4:37 AM  Result Value Ref Range   Sodium 137 135 - 145 mmol/L   Potassium 4.3 3.5 - 5.1 mmol/L   Chloride 106 98 - 111 mmol/L   CO2 23 22 - 32 mmol/L   Glucose, Bld 115 (H) 70 - 99 mg/dL    Comment: Glucose reference range applies only to samples taken after fasting for at least 8 hours.   BUN 38 (H) 8 - 23 mg/dL   Creatinine, Ser 1.19 (H) 0.44 - 1.00 mg/dL   Calcium 8.7 (L) 8.9 - 10.3 mg/dL   GFR calc non Af Amer 42 (L) >60 mL/min   GFR calc Af Amer 49 (L) >60 mL/min   Anion gap 8 5 - 15    Comment: Performed at [name] [address] [place], [place] [address]  CBC     Status: Abnormal   Collection Time: 08/01/19  4:37 AM  Result Value Ref Range   WBC 16.7 (H) 4.0 - 10.5 K/uL   RBC 3.02 (L) 3.87 - 5.11 MIL/uL   Hemoglobin 7.7 (L) 12.0 - 15.0 g/dL   HCT 27.2 (L) 36.0 - 46.0 %   MCV 90.1 80.0 - 100.0 fL   MCH 25.5 (L) 26.0 -  34.0 pg   MCHC 28.3 (L) 30.0 - 36.0 g/dL   RDW 17.5 (H) 11.5 - 15.5 %   Platelets 1,903 (HH) 150 - 400 K/uL    Comment: SPECIMEN CHECKED FOR CLOTS CONSISTENT WITH PREVIOUS RESULT THIS CRITICAL RESULT HAS VERIFIED AND BEEN CALLED TO [facility]OM[facility],K BY SHERRI HUFFINES ON 03 03 2021 AT 0554, AND HAS BEEN READ BACK.     nRBC 0.7 (H) 0.0 - 0.2 %    Comment: Performed at [facility], [address], [place], [place] [address]  SARS CORONAVIRUS 2 (TAT 6-24 HRS) Nasopharyngeal Nasopharyngeal Swab     Status: None   Collection Time: 08/01/19  4:45 AM   Specimen: Nasopharyngeal Swab  Result Value Ref Range   SARS Coronavirus 2 NEGATIVE NEGATIVE    Comment: (NOTE) SARS-CoV-2 target nucleic acids are NOT DETECTED. The SARS-CoV-2 RNA is generally detectable in upper and lower respiratory specimens during the acute phase of infection. Negative results do not preclude SARS-CoV-2 infection, do not rule out co-infections with other pathogens, and should not be used as the sole basis for treatment or other patient management decisions. Negative results must be combined with clinical observations, patient history, and epidemiological information. The expected result is Negative. Fact Sheet for Patients: SugarRoll.be Fact Sheet for Healthcare Providers: https://www.woods-mathews.com/ This test is not yet approved or cleared by the [place] FDA and  has been authorized for detection and/or diagnosis of SARS-CoV-2 by FDA under an Emergency Use Authorization (EUA). This EUA will remain  in effect (meaning this test can be used) for the duration of the COVID-19 declaration under Section 56  4(b)(1) of the Act, 21 U.S.C. section 360bbb-3(b)(1), unless the authorization is terminated or revoked sooner. Performed at [name], [place] [address], [place], [place] [address]   Brain natriuretic peptide     Status: Abnormal   Collection Time: 08/01/19  5:28 AM   Result Value Ref Range   B Natriuretic Peptide 737.0 (H) 0.0 - 100.0 pg/mL    Comment: Performed at [facility], [address] [place], [place] [address]  Prepare RBC     Status: None   Collection Time: 08/01/19  2:43 PM  Result Value Ref Range   Order Confirmation      ORDER PROCESSED BY BLOOD BANK Performed at [facility], [address] [place], [place] [address]   Type and screen [facility]     Status: None (Preliminary result)   Collection Time: 08/01/19  2:43 PM  Result Value Ref Range   ABO/RH(D) A POS    Antibody Screen NEG    Sample Expiration 08/04/2019,2359    Unit Number [id number]    Blood Component Type RED CELLS,LR    Unit division 00    Status of Unit ISSUED    Transfusion Status OK TO TRANSFUSE    Crossmatch Result      Compatible Performed at [name], [address] [place], [place] [address]   ABO/Rh     Status: None   Collection Time: 08/01/19  2:43 PM  Result Value Ref Range   ABO/RH(D)      A POS Performed at [facility], [address], [place], [place] [address]       RADIOGRAPHY: DG Chest 2 View  Result Date: 07/31/2019 CLINICAL DATA:  Severe shortness of breath, COPD, CHF, former smoker, atrial fibrillation EXAM: CHEST - 2 VIEW COMPARISON:  07/28/2019 FINDINGS: Enlargement of cardiac silhouette post median sternotomy, MVR, and LEFT atrial appendage clipping. Mediastinal contours and pulmonary vascularity normal. Atherosclerotic calcification aorta. RIGHT pleural effusion and basilar atelectasis slightly increased. Remaining lungs clear. No pleural effusion or pneumothorax. Bones demineralized. IMPRESSION: Enlargement of cardiac silhouette post surgical changes. Increased RIGHT basilar atelectasis and pleural effusion. Electronically Signed   By: Lavonia Dana M.D.   On: 07/31/2019 09:44   DG Chest Port 1 View  Result Date: 08/01/2019 CLINICAL DATA:  Severe shortness of breath. EXAM: PORTABLE CHEST 1 VIEW COMPARISON:  07/31/2019  FINDINGS: Cardiac enlargement. Status post median sternotomy and mitral valve repair. Unchanged moderate right pleural effusion and pulmonary vascular congestion. Atelectasis versus infiltrate noted in the right lung base. IMPRESSION: No change in CHF pattern. Electronically Signed   By: Kerby Moors M.D.   On: 08/01/2019 12:10         ASSESSMENT and PLAN:  1.  CML in chronic phase: -This was diagnosed on work-up for thrombocytosis. -We have prescribed her Gleevec last week.  Unfortunately she did not get the medication and started yet. -Today platelet count is 1900.  Part of the leukocytosis likely from steroids. -Gleevec cannot be given as inpatient as our pharmacy does not carry it.  Hence I would start her on hydroxyurea at a dose of 500 mg daily and titrate it up while she is inpatient.  This will help bring down her platelet count and white count.  We will continue our efforts to get her [place].  If not we will consider second-generation TKI. -She is receiving 1 unit of PRBC today for hemoglobin of 7.7.  2.  A. fib with RVR: -She is on Eliquis,  on diltiazem 60 mg every 6 hours and Toprol 25 mg daily. -She is followed by cardiology.  3.  Chronic diastolic HF: -She is receiving Lasix as needed.  Creatinine improved to 1.19.  4.  COPD exacerbation: -She is followed by Dr. Halford Chessman. -She has completed prednisone taper.  Continue Breo with as needed albuterol.  All questions were answered. The patient knows to call the clinic with any problems, questions or concerns. We can certainly see the patient much sooner if necessary.    Derek Jack

## 2019-08-01 NOTE — Progress Notes (Signed)
NAME:  Nicole [name], MRN:  [id number], DOB:  08-May-1936, LOS: 5 ADMISSION DATE:  07/27/2019, CONSULTATION DATE:  07/30/2019 REFERRING MD:  Dr. Manuella Ghazi, [facility], CHIEF COMPLAINT:  Short of breath  Brief History   84 yo female former smoker presented with worsening dyspnea and cough.  Had recent course of doxycycline and prednisone as outpt prior to admission.  Admitted with recurrent COPD exacerbation and HCAP.  History of present illness   She was in hospital recently with A fib and CHF.  Has noticed more trouble with her breathing since.  She was started on ABx for possible HCAP.  She has been getting diuretics.  She has been using breo.  She feels this helps, but concerned about expense.  Transitioned to room air at rest, but was told by PT that she would need supplemental oxygen with activity.  Leg swelling has gotten better.  Has cough with beige colored sputum.  Denies sinus congestion, sore throat, dysphagia, hemoptysis, chest pain, nausea.  Gets intermittent discomfort in abdomen from large ventral hernia, but these symptoms usually don't last long and resolve spontaneously.  Past Medical History  COPD, A fib, CML with thrombocytosis, Chronic diastolic CHF, severe AS and moderate MR s/p bioprosthetic AVR/MV ring/MAZE 2014, Amiodarone induced hyperthyroidism  Significant Hospital Events   2/26 Admit 3/03 transfuse 1 unit PRBC  Consults:  Cardiology  Procedures:    Significant Diagnostic Tests:  PFT 10/30/13 >> FEV1 1.34 (68%), FEV1% 73, TLC 3.60 (73%), DLCO 50%, +BD Echo 06/25/19 >> EF 55 to [id number], mild RV systolic dysfx, severe LA and RA dilation, s/p MVR, moderately elevated PASP CT angio chest 06/24/19 >> atherosclerosis, clipping of LA, 4.6 x 3.2 Lt thyroid enlargement, tiny pleural effusions, kyphosis, large ventral hernia  Micro Data:  SARS CoV2 PCR 2/26 >> negative Influenza PCR 2/26 >> negative Pneumococcal Ag 2/26 >> negative  Antimicrobials:  Vancomycin 2/26 >> 2/27 Cefepime  2/26 >> 3/02  Interim history/subjective:  Felt to weak this AM to go to radiology for 2 view CXR.  Denies chest pain or abdominal pain.  Not having much cough.  Objective   Blood pressure 115/65, pulse 92, temperature 97.9 F (36.6 C), temperature source Oral, resp. rate 20, height 5\' 3"  (1.6 m), weight 87.6 kg, SpO2 94 %.        Intake/Output Summary (Last 24 hours) at 08/01/2019 1212 Last data filed at 08/01/2019 0500 Gross per 24 hour  Intake 580 ml  Output 400 ml  Net 180 ml   Filed Weights   07/29/19 0552 07/29/19 0744 08/01/19 0500  Weight: 85.9 kg 88.3 kg 87.6 kg    Examination:  General - sitting in chair, feels sleepy Eyes - pupils reactive ENT - no sinus tenderness, no stridor Cardiac - irregular Chest - decreased BS at bases Rt > Lt, no wheeze Abdomen - large ventral hernia Extremities - 1+ edema Skin - no rashes Neuro - follows commands  CXR (reviewed by me) - no significant change in basilar ATX and effusions   Discussion:  84 yo female smoker with multifactorial dyspnea: 1) COPD exacerbation with hx of COPD and positive bronchodilator response, 2) possible HCAP, 3) Acute on chronic diastolic CHF with hx of A fib and valvular heart disease, 4) restrictive lung disease from kyphosis, obesity and ventral hernia, 5) anemia, 6) deconditioning.  It seems that most of her issues at present are chronic in nature rather than an acute reversible process.  The only condition that hasn't been addressed yet  is CML with thrombocytosis.  She wasn't started on Gleevac yet.  Assessment & Plan:   COPD exacerbation with hx of COPD and positive bronchodilator response. - completed prednisone taper - continue breo with prn albuterol - will need to look into less expensive option for LABA/ICS as outpt - goal SpO2 > 92%  Rt atelectasis and effusion. - stable on CXR 3/03 - diurese as tolerated  Restrictive lung disease. - from kyphosis, large ventral hernia, obesity - weight  reduction  Permanent A fib s/p MAZE. Acute on chronic diastolic CHF. Valvular heart disease s/p bioprosthetic AVR and mitral annuloplasty. Hx of HLD. - continue apixaban, atorvastatin, diltiazem, toprol XL - cardiology following  Goiter. - f/u with PCP as outpt  Hx of CML with thrombocytosis. Anemia of chronic disease. - in chronic phase - followed by Dr. Delton Coombes with hematology/oncology as outpt - declined bone marrow bx at outpt visit on 2/23/2 - plan was to start Gleevac as outpt >> hasn't been started yet - would benefit from hematology assessment while inpatient to determine if additional therapies could be offered while she is in hospital - transfuse 1 unit PRBC on 3/03 - f/u CBC  Deconditioning. - PT/OT - recommending SNF  Goals of care. - DNR/DNI - spoke with pt's daughter.  She understands Ms. Victory Dakin has several chronic conditions, but reports she was living independently until a few months ago.  She would like to see if there are any options for treating CML before she would consider transitioning goals of care  Resolved problems:  HCAP, AKI from diuresis  Best practice:  Diet: heart healthy DVT prophylaxis: apixiban GI prophylaxis: not indicated Mobility: OOB Code Status: DNR Disposition: telemetry  Labs    CMP Latest Ref Rng & Units 08/01/2019 07/31/2019 07/30/2019  Glucose 70 - 99 mg/dL 115(H) 113(H) 130(H)  BUN 8 - 23 mg/dL 38(H) 48(H) 54(H)  Creatinine 0.44 - 1.00 mg/dL 1.19(H) 1.41(H) 1.66(H)  Sodium 135 - 145 mmol/L 137 137 135  Potassium 3.5 - 5.1 mmol/L 4.3 4.6 4.5  Chloride 98 - 111 mmol/L 106 106 101  CO2 22 - 32 mmol/L 23 24 25   Calcium 8.9 - 10.3 mg/dL 8.7(L) 8.8(L) 9.0  Total Protein 6.5 - 8.1 g/dL - - -  Total Bilirubin 0.3 - 1.2 mg/dL - - -  Alkaline Phos 38 - 126 U/L - - -  AST 15 - 41 U/L - - -  ALT 0 - 44 U/L - - -    CBC Latest Ref Rng & Units 08/01/2019 07/31/2019 07/30/2019  WBC 4.0 - 10.5 K/uL 16.7(H) 15.6(H) 23.0(H)  Hemoglobin  12.0 - 15.0 g/dL 7.7(L) 7.6(L) 7.7(L)  Hematocrit 36.0 - 46.0 % 27.2(L) 26.1(L) 26.8(L)  Platelets 150 - 400 K/uL 1,903(HH) 1,851(HH) 2,077(HH)    Chesley Mires, MD [place] 08/01/2019, 12:12 PM

## 2019-08-01 NOTE — Progress Notes (Addendum)
PROGRESS NOTE   Nicole [name]  [id number] DOB: June 27, 1935 DOA: 07/27/2019 PCP: Frances Maywood, FNP   Brief Narrative:  Per HPI: Nicole [name] a 84 y.o.femalewith a history of COPD, diastolic heart failure, atrial fibrillation on anticoagulation, CML with thrombocytosis. She was recently admitted about a month ago for similar complaints. She presented with Afib and RVR. She did complete doxycycline for total of 5 days for bronchitis/bronchiectasis.  On day of admission she presented with 1 week of worsening shortness of breath which has been worsening and is now severe. Symptoms are worse with ambulation and improved with rest. No other palliating or provoking factors.  She had been prescribed doxycycline and prednisone by her oncology physician, although she has not been get these medications.  Assessment & Plan:   Active Problems:   CAD (coronary artery disease)   Atrial fibrillation (HCC)   Chronic diastolic CHF (congestive heart failure) (HCC)   Aortic stenosis   Thrombocytosis (HCC)   COPD exacerbation (HCC)   CML (chronic myelocytic leukemia) ([place])   HCAP (healthcare-associated pneumonia)   Acute respiratory failure with hypoxia (HCC)   DNR (do not resuscitate)   Acute hypoxemic respiratory failure likely secondary to HCAP with some mild pulmonary vascular congestion with acute on chronic diastolic heart failure -completed cefepime.  -bronchodilators as needed.  -Patient is normally on room air at home -Appreciate PCCM ongoing evaluation, persistent CHF on today's chest xray -Continue IS  Atrial fibrillation with RVR -Currently rate controlled and diltiazem is being titrated by cardiology service -Failed prior DCCV and trial of amiodarone -Appreciate cardiology evaluation with recommendations to continue short acting Cardizem and metoprolol XL. -Continue apixaban for full anticoagulation  AKI on CKD stage 3 -monitoring while being diuresed with IV  lasix -Creatinine improved to 1.19 from 1.6  -Monitoring urine output  COPD with mild exacerbation related to above-improving -Steroids held on 2/28 and albuterol scheduled switch to Xopenex as needed  CML with thrombocytosis -Followed by Dr. Delton Coombes outpatient -Hemoglobin 7.6 and will consider transfusion for hemoglobin less than 7 -Discussed with Dr. Halford Chessman, he asked for a hematology consultation given acute decline in condition, apparently patient had not yet started [place].    Goiter with signs of hyperthyroidism -Previously on amiodarone and this is the likely culprit -T4 level slightly elevated with decreasing TSH -We will need endocrinology follow-up outpatient   DVT prophylaxis:Apixaban Code Status:DNR Family Communication:Discussed with daughter telephone update Disposition Plan:from home, but too weak and deconditioned to return home, agreeable to SNF, Pt experiencing exacerbation of SOB today, started IV lasix, hematology consulted regarding CML with thrombocytosis, cardiology still titrating diltiazem doses, not medically ready to discharge today.  Consultants:  PCCM  Cardiology  Procedures:  See below  Antimicrobials:  Anti-infectives (From admission, onward)   Start     Dose/Rate Route Frequency Ordered Stop   07/31/19 1800  ceFEPIme (MAXIPIME) 2 g in sodium chloride 0.9 % 100 mL IVPB     2 g 200 mL/hr over 30 Minutes Intravenous  Once 07/31/19 0837 07/31/19 2047   07/31/19 0600  ceFEPIme (MAXIPIME) 2 g in sodium chloride 0.9 % 100 mL IVPB     2 g 200 mL/hr over 30 Minutes Intravenous Every 24 hours 07/30/19 1144 07/31/19 2048   07/28/19 1800  vancomycin (VANCOREADY) IVPB 500 mg/100 mL  Status:  Discontinued     500 mg 100 mL/hr over 60 Minutes Intravenous Every 24 hours 07/27/19 1744 07/29/19 0945   07/27/19 1800  ceFEPIme (MAXIPIME) 2 g in  sodium chloride 0.9 % 100 mL IVPB  Status:  Discontinued     2 g 200 mL/hr over 30 Minutes  Intravenous Every 12 hours 07/27/19 1714 07/30/19 1144   07/27/19 1800  vancomycin (VANCOREADY) IVPB 1500 mg/300 mL     1,500 mg 150 mL/hr over 120 Minutes Intravenous  Once 07/27/19 1714 07/27/19 2012   07/27/19 1700  vancomycin (VANCOCIN) IVPB 1000 mg/200 mL premix  Status:  Discontinued     1,000 mg 200 mL/hr over 60 Minutes Intravenous  Once 07/27/19 1658 07/27/19 1714   07/27/19 1700  ceFEPIme (MAXIPIME) 2 g in sodium chloride 0.9 % 100 mL IVPB  Status:  Discontinued     2 g 200 mL/hr over 30 Minutes Intravenous  Once 07/27/19 1658 07/27/19 1714      Subjective: Patient reports weakness and increasing SOB.    Objective: Vitals:   07/31/19 2335 08/01/19 0500 08/01/19 0755 08/01/19 0803  BP: 106/62 (!) 99/58  115/65  Pulse:  85  92  Resp:  20    Temp:  97.9 F (36.6 C)    TempSrc:  Oral    SpO2: 93% 91% 92% 94%  Weight:  87.6 kg    Height:        Intake/Output Summary (Last 24 hours) at 08/01/2019 1351 Last data filed at 08/01/2019 0500 Gross per 24 hour  Intake 580 ml  Output 400 ml  Net 180 ml   Filed Weights   07/29/19 0552 07/29/19 0744 08/01/19 0500  Weight: 85.9 kg 88.3 kg 87.6 kg    Examination:  General exam: very frail and weak, audibly wheezing at bedside, working with OT.  Respiratory system: diffuse wheezes, crackles at bases heard.  Cardiovascular system: S1 & S2 heard, irregularly irregular. Trace pedal edema.  Gastrointestinal system: Abdomen is nondistended, soft and nontender. No organomegaly or masses felt. Normal bowel sounds heard. Central nervous system: Alert and oriented. No focal neurological deficits. Extremities: trace edema BLEs. Skin: No rashes, lesions or ulcers Psychiatry: Judgement and insight appear normal. Mood & affect appropriate.   Data Reviewed: I have personally reviewed following labs and imaging studies  CBC: Recent Labs  Lab 07/27/19 1529 07/27/19 1529 07/28/19 0710 07/29/19 0629 07/30/19 0454 07/31/19 0502  08/01/19 0437  WBC 18.3*   < > 16.0* 29.2* 23.0* 15.6* 16.7*  NEUTROABS 13.6*  --   --   --   --   --   --   HGB 9.0*   < > 8.2* 7.8* 7.7* 7.6* 7.7*  HCT 30.7*   < > 28.2* 27.5* 26.8* 26.1* 27.2*  MCV 90.0   < > 91.0 90.2 88.7 88.8 90.1  PLT 2,257*   < > 1,924* 2,025* 2,077* 1,851* 1,903*   < > = values in this interval not displayed.   Basic Metabolic Panel: Recent Labs  Lab 07/28/19 0710 07/29/19 0629 07/30/19 0454 07/31/19 0502 08/01/19 0437  NA 138 133* 135 137 137  K 4.4 4.4 4.5 4.6 4.3  CL 104 101 101 106 106  CO2 24 22 25 24 23   GLUCOSE 193* 163* 130* 113* 115*  BUN 21 38* 54* 48* 38*  CREATININE 1.14* 1.47* 1.66* 1.41* 1.19*  CALCIUM 8.7* 8.7* 9.0 8.8* 8.7*   GFR: Estimated Creatinine Clearance: 38.3 mL/min (A) (by C-G formula based on SCr of 1.19 mg/dL (H)). Liver Function Tests: Recent Labs  Lab 07/27/19 1529  AST 28  ALT 27  ALKPHOS 65  BILITOT 1.0  PROT 6.6  ALBUMIN 3.8  No results for input(s): LIPASE, AMYLASE in the last 168 hours. No results for input(s): AMMONIA in the last 168 hours. Coagulation Profile: Recent Labs  Lab 07/27/19 1529  INR 1.9*   Cardiac Enzymes: No results for input(s): CKTOTAL, CKMB, CKMBINDEX, TROPONINI in the last 168 hours. BNP (last 3 results) No results for input(s): PROBNP in the last 8760 hours. HbA1C: No results for input(s): HGBA1C in the last 72 hours. CBG: No results for input(s): GLUCAP in the last 168 hours. Lipid Profile: No results for input(s): CHOL, HDL, LDLCALC, TRIG, CHOLHDL, LDLDIRECT in the last 72 hours. Thyroid Function Tests: Recent Labs    07/30/19 1150 07/30/19 1230  TSH  --  0.165*  FREET4 1.25*  --   T3FREE 1.9*  --    Anemia Panel: No results for input(s): VITAMINB12, FOLATE, FERRITIN, TIBC, IRON, RETICCTPCT in the last 72 hours. Sepsis Labs: Recent Labs  Lab 07/27/19 1709 07/27/19 1716 07/27/19 1925 07/28/19 0710 07/29/19 0629  PROCALCITON  --  <0.10  --  <0.10 <0.10   LATICACIDVEN 2.2*  --  1.3  --   --     Recent Results (from the past 240 hour(s))  Respiratory Panel by RT PCR (Flu A&B, Covid) - Nasopharyngeal Swab     Status: None   Collection Time: 07/27/19  3:45 PM   Specimen: Nasopharyngeal Swab  Result Value Ref Range Status   SARS Coronavirus 2 by RT PCR NEGATIVE NEGATIVE Final    Comment: (NOTE) SARS-CoV-2 target nucleic acids are NOT DETECTED. The SARS-CoV-2 RNA is generally detectable in upper respiratoy specimens during the acute phase of infection. The lowest concentration of SARS-CoV-2 viral copies this assay can detect is 131 copies/mL. A negative result does not preclude SARS-Cov-2 infection and should not be used as the sole basis for treatment or other patient management decisions. A negative result may occur with  improper specimen collection/handling, submission of specimen other than nasopharyngeal swab, presence of viral mutation(s) within the areas targeted by this assay, and inadequate number of viral copies (<131 copies/mL). A negative result must be combined with clinical observations, patient history, and epidemiological information. The expected result is Negative. Fact Sheet for Patients:  PinkCheek.be Fact Sheet for Healthcare Providers:  GravelBags.it This test is not yet ap proved or cleared by the [place] FDA and  has been authorized for detection and/or diagnosis of SARS-CoV-2 by FDA under an Emergency Use Authorization (EUA). This EUA will remain  in effect (meaning this test can be used) for the duration of the COVID-19 declaration under Section 564(b)(1) of the Act, 21 U.S.C. section 360bbb-3(b)(1), unless the authorization is terminated or revoked sooner.    Influenza A by PCR NEGATIVE NEGATIVE Final   Influenza B by PCR NEGATIVE NEGATIVE Final    Comment: (NOTE) The Xpert Xpress SARS-CoV-2/FLU/RSV assay is intended as an aid in  the  diagnosis of influenza from Nasopharyngeal swab specimens and  should not be used as a sole basis for treatment. Nasal washings and  aspirates are unacceptable for Xpert Xpress SARS-CoV-2/FLU/RSV  testing. Fact Sheet for Patients: PinkCheek.be Fact Sheet for Healthcare Providers: GravelBags.it This test is not yet approved or cleared by the [place] FDA and  has been authorized for detection and/or diagnosis of SARS-CoV-2 by  FDA under an Emergency Use Authorization (EUA). This EUA will remain  in effect (meaning this test can be used) for the duration of the  Covid-19 declaration under Section 564(b)(1) of the Act, 21  U.S.C. section  360bbb-3(b)(1), unless the authorization is  terminated or revoked. Performed at [facility], [address], [place], [place] [address]   Blood Culture (routine x 2)     Status: None   Collection Time: 07/27/19  5:20 PM   Specimen: BLOOD RIGHT HAND  Result Value Ref Range Status   Specimen Description BLOOD RIGHT HAND  Final   Special Requests   Final    BOTTLES DRAWN AEROBIC AND ANAEROBIC Blood Culture adequate volume   Culture   Final    NO GROWTH 5 DAYS Performed at [facility], [address] [place], [place] [address]    Report Status 08/01/2019 FINAL  Final  Blood Culture (routine x 2)     Status: None   Collection Time: 07/27/19  5:20 PM   Specimen: Left Antecubital; Blood  Result Value Ref Range Status   Specimen Description LEFT ANTECUBITAL  Final   Special Requests   Final    BOTTLES DRAWN AEROBIC AND ANAEROBIC Blood Culture adequate volume   Culture   Final    NO GROWTH 5 DAYS Performed at [facility], [address] [place], [place] [address]    Report Status 08/01/2019 FINAL  Final  Urine culture     Status: Abnormal   Collection Time: 07/27/19  6:35 PM   Specimen: In/Out Cath Urine  Result Value Ref Range Status   Specimen Description   Final    IN/OUT CATH  URINE Performed at [facility], [address], [place], [place] [address]    Special Requests   Final    NONE Performed at [facility], [address], [place], [place] [address]    Culture 50,000 COLONIES/mL ENTEROCOCCUS FAECALIS (A)  Final   Report Status 07/30/2019 FINAL  Final   Organism ID, Bacteria ENTEROCOCCUS FAECALIS (A)  Final      Susceptibility   Enterococcus faecalis - MIC*    AMPICILLIN <=2 SENSITIVE Sensitive     NITROFURANTOIN <=16 SENSITIVE Sensitive     VANCOMYCIN 2 SENSITIVE Sensitive     * 50,000 COLONIES/mL ENTEROCOCCUS FAECALIS  MRSA PCR Screening     Status: None   Collection Time: 07/29/19  6:03 AM   Specimen: Nasal Mucosa; Nasopharyngeal  Result Value Ref Range Status   MRSA by PCR NEGATIVE NEGATIVE Final    Comment:        The GeneXpert MRSA Assay (FDA approved for NASAL specimens only), is one component of a comprehensive MRSA colonization surveillance program. It is not intended to diagnose MRSA infection nor to guide or monitor treatment for MRSA infections. Performed at [facility], [address] [place], [place] [address]   SARS CORONAVIRUS 2 (TAT 6-24 HRS) Nasopharyngeal Nasopharyngeal Swab     Status: None   Collection Time: 08/01/19  4:45 AM   Specimen: Nasopharyngeal Swab  Result Value Ref Range Status   SARS Coronavirus 2 NEGATIVE NEGATIVE Final    Comment: (NOTE) SARS-CoV-2 target nucleic acids are NOT DETECTED. The SARS-CoV-2 RNA is generally detectable in upper and lower respiratory specimens during the acute phase of infection. Negative results do not preclude SARS-CoV-2 infection, do not rule out co-infections with other pathogens, and should not be used as the sole basis for treatment or other patient management decisions. Negative results must be combined with clinical observations, patient history, and epidemiological information. The expected result is Negative. Fact Sheet for  Patients: SugarRoll.be Fact Sheet for Healthcare Providers: https://www.woods-mathews.com/ This test is not yet approved or cleared by the [place]  FDA and  has been authorized for detection and/or diagnosis of SARS-CoV-2 by FDA under an Emergency Use Authorization (EUA). This EUA will remain  in effect (meaning this test can be used) for the duration of the COVID-19 declaration under Section 56 4(b)(1) of the Act, 21 U.S.C. section 360bbb-3(b)(1), unless the authorization is terminated or revoked sooner. Performed at [facility], [place] [address], [place], [place] [address]    Radiology Studies: DG Chest 2 View  Result Date: 07/31/2019 CLINICAL DATA:  Severe shortness of breath, COPD, CHF, former smoker, atrial fibrillation EXAM: CHEST - 2 VIEW COMPARISON:  07/28/2019 FINDINGS: Enlargement of cardiac silhouette post median sternotomy, MVR, and LEFT atrial appendage clipping. Mediastinal contours and pulmonary vascularity normal. Atherosclerotic calcification aorta. RIGHT pleural effusion and basilar atelectasis slightly increased. Remaining lungs clear. No pleural effusion or pneumothorax. Bones demineralized. IMPRESSION: Enlargement of cardiac silhouette post surgical changes. Increased RIGHT basilar atelectasis and pleural effusion. Electronically Signed   By: Lavonia Dana M.D.   On: 07/31/2019 09:44   DG Chest Port 1 View  Result Date: 08/01/2019 CLINICAL DATA:  Severe shortness of breath. EXAM: PORTABLE CHEST 1 VIEW COMPARISON:  07/31/2019 FINDINGS: Cardiac enlargement. Status post median sternotomy and mitral valve repair. Unchanged moderate right pleural effusion and pulmonary vascular congestion. Atelectasis versus infiltrate noted in the right lung base. IMPRESSION: No change in CHF pattern. Electronically Signed   By: Kerby Moors M.D.   On: 08/01/2019 12:10   Scheduled Meds: . sodium chloride   Intravenous Once  . apixaban  5 mg  Oral BID  . atorvastatin  40 mg Oral q1800  . busPIRone  10 mg Oral BID  . diltiazem  60 mg Oral Q6H  . DULoxetine  20 mg Oral Daily  . feeding supplement (ENSURE ENLIVE)  237 mL Oral Q24H  . ferrous sulfate  325 mg Oral Q breakfast  . fluticasone furoate-vilanterol  1 puff Inhalation Daily  . metoprolol succinate  25 mg Oral Daily  . multivitamin with minerals  1 tablet Oral Daily   Continuous Infusions: . sodium chloride       LOS: 5 days   Time spent: 30 minutes  Nicole Frenkel Wynetta Emery, MD [facility] How to contact the [facility] Attending or Consulting provider [place] or covering provider during after hours [place], for this patient?  1. Check the care team in [facility] and look for a) attending/consulting [facility] provider listed and b) the [facility] team listed 2. Log into [email or web address] and use [place]'s universal password to access. If you do not have the password, please contact the hospital operator. 3. Locate the [facility] provider you are looking for under [facility] and page to a number that you can be directly reached. 4. If you still have difficulty reaching the provider, please page the [facility] (Director on Call) for the Hospitalists listed on amion for assistance.   If 7PM-7AM, please contact night-coverage [email or web address] Password [email or web address] 08/01/2019, 1:51 PM

## 2019-08-01 NOTE — Progress Notes (Signed)
Progress Note  Patient Name: Nicole [name] Date of Encounter: 08/01/2019  Primary Cardiologist: [name], MD   Subjective   Increased work of breathing this AM  Inpatient Medications    Scheduled Meds: . apixaban  5 mg Oral BID  . atorvastatin  40 mg Oral q1800  . busPIRone  10 mg Oral BID  . diltiazem  60 mg Oral Q6H  . DULoxetine  20 mg Oral Daily  . feeding supplement (ENSURE ENLIVE)  237 mL Oral Q24H  . ferrous sulfate  325 mg Oral Q breakfast  . fluticasone furoate-vilanterol  1 puff Inhalation Daily  . metoprolol succinate  25 mg Oral Daily  . multivitamin with minerals  1 tablet Oral Daily   Continuous Infusions: . sodium chloride     PRN Meds: acetaminophen, albuterol, ALPRAZolam, levalbuterol, ondansetron (ZOFRAN) IV, prochlorperazine   Vital Signs    Vitals:   07/31/19 2335 08/01/19 0500 08/01/19 0755 08/01/19 0803  BP: 106/62 (!) 99/58  115/65  Pulse:  85  92  Resp:  20    Temp:  97.9 F (36.6 C)    TempSrc:  Oral    SpO2: 93% 91% 92% 94%  Weight:  87.6 kg    Height:        Intake/Output Summary (Last 24 hours) at 08/01/2019 0852 Last data filed at 08/01/2019 0500 Gross per 24 hour  Intake 580 ml  Output 400 ml  Net 180 ml   Last 3 Weights 08/01/2019 07/29/2019 07/29/2019  Weight (lbs) 193 lb 2 oz 194 lb 10.7 oz 189 lb 6 oz  Weight (kg) 87.6 kg 88.3 kg 85.9 kg      Telemetry    afib variable rates - Personally Reviewed  ECG    n/a - Personally Reviewed  Physical Exam   GEN: No acute distress.   Neck: elevated JVD Cardiac: irreg, no murmurs, rubs, or gallops.  Respiratory: crackles bilateral bases GI: Soft, nontender, non-distended  MS: No edema; No deformity. Neuro:  Nonfocal  Psych: Normal affect   Labs    High Sensitivity Troponin:   Recent Labs  Lab 07/27/19 1529 07/27/19 1709 07/29/19 1712 07/29/19 1813  TROPONINIHS 17 18* 68* 70*      Chemistry Recent Labs  Lab 07/27/19 1529 07/28/19 0710 07/30/19 0454  07/31/19 0502 08/01/19 0437  NA 138   < > 135 137 137  K 3.8   < > 4.5 4.6 4.3  CL 100   < > 101 106 106  CO2 27   < > 25 24 23   GLUCOSE 146*   < > 130* 113* 115*  BUN 21   < > 54* 48* 38*  CREATININE 1.20*   < > 1.66* 1.41* 1.19*  CALCIUM 9.0   < > 9.0 8.8* 8.7*  PROT 6.6  --   --   --   --   ALBUMIN 3.8  --   --   --   --   AST 28  --   --   --   --   ALT 27  --   --   --   --   ALKPHOS 65  --   --   --   --   BILITOT 1.0  --   --   --   --   GFRNONAA 42*   < > 28* 35* 42*  GFRAA 49*   < > 33* 40* 49*  ANIONGAP 11   < > 9 7 8    < > =  values in this interval not displayed.     Hematology Recent Labs  Lab 07/30/19 0454 07/31/19 0502 08/01/19 0437  WBC 23.0* 15.6* 16.7*  RBC 3.02* 2.94* 3.02*  HGB 7.7* 7.6* 7.7*  HCT 26.8* 26.1* 27.2*  MCV 88.7 88.8 90.1  MCH 25.5* 25.9* 25.5*  MCHC 28.7* 29.1* 28.3*  RDW 17.7* 17.6* 17.5*  PLT 2,077* 1,851* 1,903*    BNP Recent Labs  Lab 07/27/19 1529 07/28/19 1600 07/30/19 0454  BNP 590.0* 851.0* 822.0*     DDimer No results for input(s): DDIMER in the last 168 hours.   Radiology    DG Chest 2 View  Result Date: 07/31/2019 CLINICAL DATA:  Severe shortness of breath, COPD, CHF, former smoker, atrial fibrillation EXAM: CHEST - 2 VIEW COMPARISON:  07/28/2019 FINDINGS: Enlargement of cardiac silhouette post median sternotomy, MVR, and LEFT atrial appendage clipping. Mediastinal contours and pulmonary vascularity normal. Atherosclerotic calcification aorta. RIGHT pleural effusion and basilar atelectasis slightly increased. Remaining lungs clear. No pleural effusion or pneumothorax. Bones demineralized. IMPRESSION: Enlargement of cardiac silhouette post surgical changes. Increased RIGHT basilar atelectasis and pleural effusion. Electronically Signed   By: Lavonia Dana M.D.   On: 07/31/2019 09:44    Cardiac Studies     Patient Profile   Nicole [name] is a 84 y.o. female with past medical history of permanent atrial fibrillation  (failed prior DCCV, developed hyperthyroidism and junctional rhythm with Amiodarone in the past --> rate-control strategy pursued), valvular heart disease (s/p tissue AVR in 2014 with MV ring and maze procedure), chronic diastolic CHF, COPD, hyperthyroidism, HLD and CML with thrombocytosis who is being seen today for the evaluation of atrial fibrillation with RVR at the request of Dr. Manuella Ghazi.    Assessment & Plan    1. Afib with RVR - had failed prior DCCV and was intolerant to Amiodarone in the past due to hyperthyroidism and junctional rhythm. Has been undergoing rate control strategy - low bp's this admission have affected rate control - has been on dilt 240 and toprol 25mg  bid at home. Due to low bp's during admission on dilt short acting 60mg  every 6 hours, toprol 25mg  just once daily - remains on eliquis  - bp's improving, tolerating av nodal agents as currently dosed. Would continue short acting dilt today, likely change back to her long acting tomorrow, may titrate up toprol pending bp's as well back to her home dose.   2. Chronic diastolic HF - diuretics have been held due to AKI, worsening renal function - CXR yesterday showed increased right basilar atelectasis and pleural effusion - increased work of breathing this AM - appears fluid overlaoded by exam> I think we are going to have to accept a higher Cr for her in order to keep her diuresed. Dose IV lasix to day 40mg  x 1.   3. AKI  - baseline Cr around 0.9. Admitted with Cr 1.2, with fluids down to 1.1. On the morning of the 27th got torsemide 20mg  bid and also lasix 80mg  x 1, Cr up to 1.47 and then 1.66 on March 1st. Soft bp's on admit which seemed to worsen on 27th. Off diuretics, today Cr down to 1.19.   4. Hypotension - in setting of pneumonia, diuresis - diuretics held, bp's are improving. Has not had to have any av nodal agents held yesterday  5. COPD - per pulmonary  6. Pleural effusion - followed by  pulmonary     For questions or updates, please contact [place] Please  consult [email or web address] for contact info under        Signed, Carlyle Dolly, MD  08/01/2019, 8:52 AM

## 2019-08-01 NOTE — Progress Notes (Signed)
CRITICAL VALUE ALERT  Critical Value:  Platelets 1903  Date & Time Notied:  08/01/2019 0604  Provider Notified: Dr. Volanda Napoleon

## 2019-08-01 NOTE — Care Management Important Message (Signed)
Important Message  Patient Details  Name: Nicole [name] MRN: [id number] Date of Birth: [birth year]   Medicare Important Message Given:  Yes     [name] 08/01/2019, 1:45 PM

## 2019-08-01 NOTE — Evaluation (Signed)
Occupational Therapy Evaluation Patient Details Name: Nicole [name] MRN: [id number] DOB: 11-18-1935 Today's Date: 08/01/2019    History of Present Illness Nicole [name] is a 84 y.o. female with a history of COPD, diastolic heart failure, atrial fibrillation on anticoagulation, CML with thrombocytosis.  She was recently admitted about a month ago for similar complaints.  She DU with A. fib and RVR.  She did complete doxycycline for total of 5 days for bronchitis/bronchiectasis.  Today, she presents with 1 week of worsening shortness of breath which has been worsening and is now severe.  Symptoms are worse with ambulation and improved with rest.  No other palliating or provoking factors.  Have mild cough.  She had been prescribed doxycycline and prednisone by her oncology physician, although she has not been get these medications.   Clinical Impression   Pt agreeable to OT evaluation this am.  Pt requiring increased time for tasks due to SOB and fatigue. Educated pt on PLB while seated at EOB and after transfer to chair, pt requiring verbal cuing for correct completion. Pt completing ADLs with set up while seated, unable to complete standing tasks due to poor activity tolerance. SpO2 remaining at 95-98% during session. Recommend SNF on discharge to improve strength, safety and independence in ADL completion.     Follow Up Recommendations  SNF    Equipment Recommendations  None recommended by OT       Precautions / Restrictions Precautions Precautions: Fall Restrictions Weight Bearing Restrictions: No      Mobility Bed Mobility Overal bed mobility: Needs Assistance Bed Mobility: Supine to Sit     Supine to sit: Min assist     General bed mobility comments: increased time, labored movement, required use of bed rail  Transfers Overall transfer level: Needs assistance Equipment used: Rolling walker (2 wheeled) Transfers: Sit to/from [facility] Sit to Stand: Mod  assist Stand pivot transfers: Min assist;Mod assist                ADL either performed or assessed with clinical judgement   ADL Overall ADL's : Needs assistance/impaired Eating/Feeding: Modified independent;Sitting   Grooming: Set up;Sitting Grooming Details (indicate cue type and reason): pt unable to complete in standing due to poor activity tolerance                  Toilet Transfer: Moderate assistance;RW;Stand-pivot Toilet Transfer Details (indicate cue type and reason): simulated with bed to chair transfer           General ADL Comments: Pt requiring increased assistance for ADL completion due to generalized weakness and poor activity tolerance     Vision Baseline Vision/History: No visual deficits Patient Visual Report: No change from baseline Vision Assessment?: No apparent visual deficits            Pertinent Vitals/Pain Pain Assessment: No/denies pain     Hand Dominance Right   Extremity/Trunk Assessment Upper Extremity Assessment Upper Extremity Assessment: Generalized weakness   Lower Extremity Assessment Lower Extremity Assessment: Defer to PT evaluation   Cervical / Trunk Assessment Cervical / Trunk Assessment: Normal   Communication Communication Communication: No difficulties   Cognition Arousal/Alertness: Awake/alert Behavior During Therapy: WFL for tasks assessed/performed Overall Cognitive Status: Within Functional Limits for tasks assessed  Home Living Family/patient expects to be discharged to:: Private residence Living Arrangements: Alone Available Help at Discharge: Friend(s);Available PRN/intermittently Type of Home: Apartment Home Access: Level entry     Home Layout: One level     Bathroom Shower/Tub: Teacher, early years/pre: Standard     Home Equipment: Environmental consultant - 2 wheels;Cane - single point;Tub bench          Prior  Functioning/Environment Level of Independence: Independent with assistive device(s)        Comments: household ambulator usually leaning on furniture or using SPC, uses RW for longer distances, reports independence with ADLs        OT Problem List: Cardiopulmonary status limiting activity;Decreased safety awareness;Impaired balance (sitting and/or standing);Decreased activity tolerance;Decreased strength       End of Session Equipment Utilized During Treatment: Gait belt;Rolling walker;Oxygen  Activity Tolerance: Patient limited by fatigue Patient left: in chair;with call bell/phone within reach;with chair alarm set  OT Visit Diagnosis: Muscle weakness (generalized) (M62.81)                Time: [id number] OT Time Calculation (min): 20 min Charges:  OT General Charges $OT Visit: 1 Visit OT Evaluation $OT Eval Low Complexity: [place], OTR/L  [phone] 08/01/2019, 9:29 AM

## 2019-08-02 ENCOUNTER — Inpatient Hospital Stay (HOSPITAL_COMMUNITY): Payer: Medicare Other

## 2019-08-02 DIAGNOSIS — A419 Sepsis, unspecified organism: Principal | ICD-10-CM

## 2019-08-02 DIAGNOSIS — I5032 Chronic diastolic (congestive) heart failure: Secondary | ICD-10-CM

## 2019-08-02 DIAGNOSIS — Z66 Do not resuscitate: Secondary | ICD-10-CM

## 2019-08-02 DIAGNOSIS — I4891 Unspecified atrial fibrillation: Secondary | ICD-10-CM

## 2019-08-02 DIAGNOSIS — R0600 Dyspnea, unspecified: Secondary | ICD-10-CM

## 2019-08-02 DIAGNOSIS — J90 Pleural effusion, not elsewhere classified: Secondary | ICD-10-CM

## 2019-08-02 LAB — ECHOCARDIOGRAM COMPLETE
Height: 63 in
Weight: 3068.8 oz

## 2019-08-02 LAB — BASIC METABOLIC PANEL
Anion gap: 8 (ref 5–15)
BUN: 33 mg/dL — ABNORMAL HIGH (ref 8–23)
CO2: 25 mmol/L (ref 22–32)
Calcium: 8.8 mg/dL — ABNORMAL LOW (ref 8.9–10.3)
Chloride: 107 mmol/L (ref 98–111)
Creatinine, Ser: 1.22 mg/dL — ABNORMAL HIGH (ref 0.44–1.00)
GFR calc Af Amer: 48 mL/min — ABNORMAL LOW (ref >60)
GFR calc non Af Amer: 41 mL/min — ABNORMAL LOW (ref >60)
Glucose, Bld: 99 mg/dL (ref 70–99)
Potassium: 3.9 mmol/L (ref 3.5–5.1)
Sodium: 140 mmol/L (ref 135–145)

## 2019-08-02 LAB — CBC
HCT: 29.7 % — ABNORMAL LOW (ref 36.0–46.0)
Hemoglobin: 8.7 g/dL — ABNORMAL LOW (ref 12.0–15.0)
MCH: 26.4 pg (ref 26.0–34.0)
MCHC: 29.3 g/dL — ABNORMAL LOW (ref 30.0–36.0)
MCV: 90.3 fL (ref 80.0–100.0)
Platelets: 1869 10*3/uL (ref 150–400)
RBC: 3.29 MIL/uL — ABNORMAL LOW (ref 3.87–5.11)
RDW: 17.1 % — ABNORMAL HIGH (ref 11.5–15.5)
WBC: 13.1 10*3/uL — ABNORMAL HIGH (ref 4.0–10.5)
nRBC: 0.6 % — ABNORMAL HIGH (ref 0.0–0.2)

## 2019-08-02 LAB — MAGNESIUM: Magnesium: 2.7 mg/dL — ABNORMAL HIGH (ref 1.7–2.4)

## 2019-08-02 LAB — SARS CORONAVIRUS 2 (TAT 6-24 HRS): SARS Coronavirus 2: NEGATIVE

## 2019-08-02 MED ORDER — HYDROXYUREA 500 MG PO CAPS
1000.0000 mg | ORAL_CAPSULE | Freq: Every day | ORAL | Status: DC
  Administered 2019-08-03 – 2019-08-06 (×4): 1000 mg via ORAL
  Filled 2019-08-02 (×5): qty 2

## 2019-08-02 MED ORDER — HYDROXYUREA 500 MG PO CAPS
500.0000 mg | ORAL_CAPSULE | Freq: Once | ORAL | Status: AC
  Administered 2019-08-02: 500 mg via ORAL
  Filled 2019-08-02: qty 1

## 2019-08-02 NOTE — Plan of Care (Signed)
  Problem: Education: Goal: Knowledge of General Education information will improve Description: Including pain rating scale, medication(s)/side effects and non-pharmacologic comfort measures Outcome: Progressing   Problem: Health Behavior/Discharge Planning: Goal: Ability to manage health-related needs will improve Outcome: Progressing   Problem: Clinical Measurements: Goal: Ability to maintain clinical measurements within normal limits will improve Outcome: Progressing Goal: Will remain free from infection Outcome: Progressing Goal: Diagnostic test results will improve Outcome: Progressing Goal: Respiratory complications will improve Outcome: Progressing Goal: Cardiovascular complication will be avoided Outcome: Progressing   Problem: Activity: Goal: Risk for activity intolerance will decrease Outcome: Progressing   Problem: Nutrition: Goal: Adequate nutrition will be maintained Outcome: Progressing   Problem: Coping: Goal: Level of anxiety will decrease Outcome: Progressing   Problem: Elimination: Goal: Will not experience complications related to bowel motility Outcome: Progressing Goal: Will not experience complications related to urinary retention Outcome: Progressing   Problem: Pain Managment: Goal: General experience of comfort will improve Outcome: Progressing   Problem: Safety: Goal: Ability to remain free from injury will improve Outcome: Progressing   Problem: Skin Integrity: Goal: Risk for impaired skin integrity will decrease Outcome: Progressing   Problem: Education: Goal: Knowledge of General Education information will improve Description: Including pain rating scale, medication(s)/side effects and non-pharmacologic comfort measures Outcome: Progressing   Problem: Health Behavior/Discharge Planning: Goal: Ability to manage health-related needs will improve Outcome: Progressing   Problem: Clinical Measurements: Goal: Ability to maintain  clinical measurements within normal limits will improve Outcome: Progressing Goal: Will remain free from infection Outcome: Progressing Goal: Diagnostic test results will improve Outcome: Progressing Goal: Respiratory complications will improve Outcome: Progressing Goal: Cardiovascular complication will be avoided Outcome: Progressing   Problem: Activity: Goal: Risk for activity intolerance will decrease Outcome: Progressing   Problem: Nutrition: Goal: Adequate nutrition will be maintained Outcome: Progressing   Problem: Coping: Goal: Level of anxiety will decrease Outcome: Progressing   Problem: Elimination: Goal: Will not experience complications related to bowel motility Outcome: Progressing Goal: Will not experience complications related to urinary retention Outcome: Progressing   Problem: Pain Managment: Goal: General experience of comfort will improve Outcome: Progressing   Problem: Safety: Goal: Ability to remain free from injury will improve Outcome: Progressing   Problem: Skin Integrity: Goal: Risk for impaired skin integrity will decrease Outcome: Progressing   Problem: Education: Goal: Knowledge of disease or condition will improve Outcome: Progressing Goal: Knowledge of the prescribed therapeutic regimen will improve Outcome: Progressing Goal: Individualized Educational Video(s) Outcome: Progressing   Problem: Activity: Goal: Ability to tolerate increased activity will improve Outcome: Progressing Goal: Will verbalize the importance of balancing activity with adequate rest periods Outcome: Progressing   Problem: Respiratory: Goal: Ability to maintain a clear airway will improve Outcome: Progressing Goal: Levels of oxygenation will improve Outcome: Progressing Goal: Ability to maintain adequate ventilation will improve Outcome: Progressing   

## 2019-08-02 NOTE — Progress Notes (Signed)
PROGRESS NOTE   Nicole [name]  [id number] DOB: 1935/10/31 DOA: 07/27/2019 PCP: Frances Maywood, FNP   Brief Narrative:  Per HPI: Nicole [name] a 84 y.o.femalewith a history of COPD, diastolic heart failure, atrial fibrillation on anticoagulation, CML with thrombocytosis. She was recently admitted about a month ago for similar complaints. She presented with Afib and RVR. She did complete doxycycline for total of 5 days for bronchitis/bronchiectasis.  On day of admission she presented with 1 week of worsening shortness of breath which has been worsening and is now severe. Symptoms are worse with ambulation and improved with rest. No other palliating or provoking factors.  She had been prescribed doxycycline and prednisone by her oncology physician, although she has not been get these medications.  Assessment & Plan:   Active Problems:   CAD (coronary artery disease)   Atrial fibrillation (HCC)   Chronic diastolic CHF (congestive heart failure) (HCC)   Aortic stenosis   Thrombocytosis (HCC)   COPD exacerbation (HCC)   CML (chronic myelocytic leukemia) ([place])   HCAP (healthcare-associated pneumonia)   Acute respiratory failure with hypoxia (HCC)   DNR (do not resuscitate)   Acute hypoxemic respiratory failure likely secondary to HCAP with some mild pulmonary vascular congestion with acute on chronic diastolic heart failure -completed cefepime.  -bronchodilators as needed.  -Patient is normally on room air at home -Appreciate PCCM ongoing evaluation, persistent CHF on today's chest xray -Continue IS  Atrial fibrillation with RVR -Currently rate controlled and diltiazem is being titrated by cardiology service -Failed prior DCCV and trial of amiodarone -Appreciate cardiology evaluation with recommendations to continue short acting Cardizem and metoprolol XL. -Continue apixaban for full anticoagulation  AKI on CKD stage 3 -monitoring while being diuresed with IV lasix  -Creatinine improved to 1.19 from 1.6  -Monitoring urine output  COPD with mild exacerbation related to above-improving -Steroids held on 2/28 and albuterol scheduled switch to Xopenex as needed  CML with thrombocytosis -Followed by Dr. Delton Coombes outpatient -Hemoglobin 7.6 and will consider transfusion for hemoglobin less than 7 -Discussed with Dr. Halford Chessman, he asked for a hematology consultation given acute decline in condition, patient had not yet started Gleevec.   -Consulted Dr. Delton Coombes, he has started patient on hydrea until she can obtain the [place].  Platelets unchanged today. Recheck in AM.   Goiter with signs of hyperthyroidism -Previously on amiodarone and this is the likely culprit -T4 level slightly elevated with decreasing TSH -We will need endocrinology follow-up outpatient  Anemia in neoplastic disease - pt is s/p 1 unit PRBC on 08/01/19 and Hg improved to 8.7.    DVT prophylaxis:Apixaban Code Status:DNR Family Communication:Discussed with daughter telephone update Disposition Plan:from home, but too weak and deconditioned to return home, agreeable to SNF, Pt experiencing exacerbation of SOB today, started IV lasix, hematology consulted regarding CML with thrombocytosis, cardiology still titrating diltiazem doses, not medically ready to discharge today.  Consultants:  PCCM  Cardiology  Procedures:  See below  Antimicrobials:  Anti-infectives (From admission, onward)   Start     Dose/Rate Route Frequency Ordered Stop   07/31/19 1800  ceFEPIme (MAXIPIME) 2 g in sodium chloride 0.9 % 100 mL IVPB     2 g 200 mL/hr over 30 Minutes Intravenous  Once 07/31/19 0837 07/31/19 2047   07/31/19 0600  ceFEPIme (MAXIPIME) 2 g in sodium chloride 0.9 % 100 mL IVPB     2 g 200 mL/hr over 30 Minutes Intravenous Every 24 hours 07/30/19 1144 07/31/19 2048  07/28/19 1800  vancomycin (VANCOREADY) IVPB 500 mg/100 mL  Status:  Discontinued     500 mg 100 mL/hr over  60 Minutes Intravenous Every 24 hours 07/27/19 1744 07/29/19 0945   07/27/19 1800  ceFEPIme (MAXIPIME) 2 g in sodium chloride 0.9 % 100 mL IVPB  Status:  Discontinued     2 g 200 mL/hr over 30 Minutes Intravenous Every 12 hours 07/27/19 1714 07/30/19 1144   07/27/19 1800  vancomycin (VANCOREADY) IVPB 1500 mg/300 mL     1,500 mg 150 mL/hr over 120 Minutes Intravenous  Once 07/27/19 1714 07/27/19 2012   07/27/19 1700  vancomycin (VANCOCIN) IVPB 1000 mg/200 mL premix  Status:  Discontinued     1,000 mg 200 mL/hr over 60 Minutes Intravenous  Once 07/27/19 1658 07/27/19 1714   07/27/19 1700  ceFEPIme (MAXIPIME) 2 g in sodium chloride 0.9 % 100 mL IVPB  Status:  Discontinued     2 g 200 mL/hr over 30 Minutes Intravenous  Once 07/27/19 1658 07/27/19 1714      Subjective: Patient reports that she is feeling better today after the blood transfusion.      Objective: Vitals:   08/02/19 0813 08/02/19 0907 08/02/19 1308 08/02/19 1316  BP:  110/84 (!) 105/46   Pulse:  95 84   Resp:      Temp:   98.4 F (36.9 C)   TempSrc:   Oral   SpO2: 96%  97% 97%  Weight:      Height:        Intake/Output Summary (Last 24 hours) at 08/02/2019 1400 Last data filed at 08/02/2019 1310 Gross per 24 hour  Intake 990 ml  Output 1000 ml  Net -10 ml   Filed Weights   07/29/19 0744 08/01/19 0500 08/02/19 0500  Weight: 88.3 kg 87.6 kg 87 kg    Examination:  General exam: very frail and weak, audibly wheezing at bedside, working with OT.  Respiratory system: diffuse wheezes, crackles at bases heard.  Cardiovascular system: S1 & S2 heard, irregularly irregular. Trace pedal edema.  Gastrointestinal system: Abdomen is nondistended, soft and nontender. No organomegaly or masses felt. Normal bowel sounds heard. Central nervous system: Alert and oriented. No focal neurological deficits. Extremities: trace edema BLEs. Skin: No rashes, lesions or ulcers Psychiatry: Judgement and insight appear normal. Mood &  affect appropriate.   Data Reviewed: I have personally reviewed following labs and imaging studies  CBC: Recent Labs  Lab 07/27/19 1529 07/28/19 0710 07/29/19 [id number] 07/29/19 0629 07/30/19 0454 07/31/19 0502 08/01/19 0437 08/01/19 2247 08/02/19 0410  WBC 18.3*   < > 29.2*  --  23.0* 15.6* 16.7*  --  13.1*  NEUTROABS 13.6*  --   --   --   --   --   --   --   --   HGB 9.0*   < > 7.8*   < > 7.7* 7.6* 7.7* 8.9* 8.7*  HCT 30.7*   < > 27.5*   < > 26.8* 26.1* 27.2* 29.7* 29.7*  MCV 90.0   < > 90.2  --  88.7 88.8 90.1  --  90.3  PLT 2,257*   < > 2,025*  --  2,077* 1,851* 1,903*  --  1,869*   < > = values in this interval not displayed.   Basic Metabolic Panel: Recent Labs  Lab 07/29/19 0629 07/30/19 0454 07/31/19 0502 08/01/19 0437 08/02/19 0410  NA 133* 135 137 137 140  K 4.4 4.5 4.6 4.3 3.9  CL 101 101 106 106 107  CO2 22 25 24 23 25   GLUCOSE 163* 130* 113* 115* 99  BUN 38* 54* 48* 38* 33*  CREATININE 1.47* 1.66* 1.41* 1.19* 1.22*  CALCIUM 8.7* 9.0 8.8* 8.7* 8.8*  MG  --   --   --   --  2.7*   GFR: Estimated Creatinine Clearance: 37.2 mL/min (A) (by C-G formula based on SCr of 1.22 mg/dL (H)). Liver Function Tests: Recent Labs  Lab 07/27/19 1529  AST 28  ALT 27  ALKPHOS 65  BILITOT 1.0  PROT 6.6  ALBUMIN 3.8   No results for input(s): LIPASE, AMYLASE in the last 168 hours. No results for input(s): AMMONIA in the last 168 hours. Coagulation Profile: Recent Labs  Lab 07/27/19 1529  INR 1.9*   Cardiac Enzymes: No results for input(s): CKTOTAL, CKMB, CKMBINDEX, TROPONINI in the last 168 hours. BNP (last 3 results) No results for input(s): PROBNP in the last 8760 hours. HbA1C: No results for input(s): HGBA1C in the last 72 hours. CBG: No results for input(s): GLUCAP in the last 168 hours. Lipid Profile: No results for input(s): CHOL, HDL, LDLCALC, TRIG, CHOLHDL, LDLDIRECT in the last 72 hours. Thyroid Function Tests: No results for input(s): TSH, T4TOTAL,  FREET4, T3FREE, THYROIDAB in the last 72 hours. Anemia Panel: No results for input(s): VITAMINB12, FOLATE, FERRITIN, TIBC, IRON, RETICCTPCT in the last 72 hours. Sepsis Labs: Recent Labs  Lab 07/27/19 1709 07/27/19 1716 07/27/19 1925 07/28/19 0710 07/29/19 0629  PROCALCITON  --  <0.10  --  <0.10 <0.10  LATICACIDVEN 2.2*  --  1.3  --   --     Recent Results (from the past 240 hour(s))  Respiratory Panel by RT PCR (Flu A&B, Covid) - Nasopharyngeal Swab     Status: None   Collection Time: 07/27/19  3:45 PM   Specimen: Nasopharyngeal Swab  Result Value Ref Range Status   SARS Coronavirus 2 by RT PCR NEGATIVE NEGATIVE Final    Comment: (NOTE) SARS-CoV-2 target nucleic acids are NOT DETECTED. The SARS-CoV-2 RNA is generally detectable in upper respiratoy specimens during the acute phase of infection. The lowest concentration of SARS-CoV-2 viral copies this assay can detect is 131 copies/mL. A negative result does not preclude SARS-Cov-2 infection and should not be used as the sole basis for treatment or other patient management decisions. A negative result may occur with  improper specimen collection/handling, submission of specimen other than nasopharyngeal swab, presence of viral mutation(s) within the areas targeted by this assay, and inadequate number of viral copies (<131 copies/mL). A negative result must be combined with clinical observations, patient history, and epidemiological information. The expected result is Negative. Fact Sheet for Patients:  PinkCheek.be Fact Sheet for Healthcare Providers:  GravelBags.it This test is not yet ap proved or cleared by the [place] FDA and  has been authorized for detection and/or diagnosis of SARS-CoV-2 by FDA under an Emergency Use Authorization (EUA). This EUA will remain  in effect (meaning this test can be used) for the duration of the COVID-19 declaration under  Section 564(b)(1) of the Act, 21 U.S.C. section 360bbb-3(b)(1), unless the authorization is terminated or revoked sooner.    Influenza A by PCR NEGATIVE NEGATIVE Final   Influenza B by PCR NEGATIVE NEGATIVE Final    Comment: (NOTE) The Xpert Xpress SARS-CoV-2/FLU/RSV assay is intended as an aid in  the diagnosis of influenza from Nasopharyngeal swab specimens and  should not be used as a sole basis for  treatment. Nasal washings and  aspirates are unacceptable for Xpert Xpress SARS-CoV-2/FLU/RSV  testing. Fact Sheet for Patients: PinkCheek.be Fact Sheet for Healthcare Providers: GravelBags.it This test is not yet approved or cleared by the [place] FDA and  has been authorized for detection and/or diagnosis of SARS-CoV-2 by  FDA under an Emergency Use Authorization (EUA). This EUA will remain  in effect (meaning this test can be used) for the duration of the  Covid-19 declaration under Section 564(b)(1) of the Act, 21  U.S.C. section 360bbb-3(b)(1), unless the authorization is  terminated or revoked. Performed at [facility], [address] [place], [place] [address]   Blood Culture (routine x 2)     Status: None   Collection Time: 07/27/19  5:20 PM   Specimen: BLOOD RIGHT HAND  Result Value Ref Range Status   Specimen Description BLOOD RIGHT HAND  Final   Special Requests   Final    BOTTLES DRAWN AEROBIC AND ANAEROBIC Blood Culture adequate volume   Culture   Final    NO GROWTH 5 DAYS Performed at [facility], [address], [place], [place] [address]    Report Status 08/01/2019 FINAL  Final  Blood Culture (routine x 2)     Status: None   Collection Time: 07/27/19  5:20 PM   Specimen: Left Antecubital; Blood  Result Value Ref Range Status   Specimen Description LEFT ANTECUBITAL  Final   Special Requests   Final    BOTTLES DRAWN AEROBIC AND ANAEROBIC Blood Culture adequate volume   Culture   Final    NO GROWTH  5 DAYS Performed at [facility], [address] [place], [place] [address]    Report Status 08/01/2019 FINAL  Final  Urine culture     Status: Abnormal   Collection Time: 07/27/19  6:35 PM   Specimen: In/Out Cath Urine  Result Value Ref Range Status   Specimen Description   Final    IN/OUT CATH URINE Performed at [facility], [address], [place], [place] [address]    Special Requests   Final    NONE Performed at [facility], [address] [place], [place] [address]    Culture 50,000 COLONIES/mL ENTEROCOCCUS FAECALIS (A)  Final   Report Status 07/30/2019 FINAL  Final   Organism ID, Bacteria ENTEROCOCCUS FAECALIS (A)  Final      Susceptibility   Enterococcus faecalis - MIC*    AMPICILLIN <=2 SENSITIVE Sensitive     NITROFURANTOIN <=16 SENSITIVE Sensitive     VANCOMYCIN 2 SENSITIVE Sensitive     * 50,000 COLONIES/mL ENTEROCOCCUS FAECALIS  MRSA PCR Screening     Status: None   Collection Time: 07/29/19  6:03 AM   Specimen: Nasal Mucosa; Nasopharyngeal  Result Value Ref Range Status   MRSA by PCR NEGATIVE NEGATIVE Final    Comment:        The GeneXpert MRSA Assay (FDA approved for NASAL specimens only), is one component of a comprehensive MRSA colonization surveillance program. It is not intended to diagnose MRSA infection nor to guide or monitor treatment for MRSA infections. Performed at [facility], [address] [place], [place] [address]   SARS CORONAVIRUS 2 (TAT 6-24 HRS) Nasopharyngeal Nasopharyngeal Swab     Status: None   Collection Time: 08/01/19  4:45 AM   Specimen: Nasopharyngeal Swab  Result Value Ref Range Status   SARS Coronavirus 2 NEGATIVE NEGATIVE Final    Comment: (NOTE) SARS-CoV-2 target nucleic acids are NOT DETECTED. The SARS-CoV-2  RNA is generally detectable in upper and lower respiratory specimens during the acute phase of infection. Negative results do not preclude SARS-CoV-2 infection, do not rule out co-infections with other  pathogens, and should not be used as the sole basis for treatment or other patient management decisions. Negative results must be combined with clinical observations, patient history, and epidemiological information. The expected result is Negative. Fact Sheet for Patients: SugarRoll.be Fact Sheet for Healthcare Providers: https://www.woods-mathews.com/ This test is not yet approved or cleared by the [place] FDA and  has been authorized for detection and/or diagnosis of SARS-CoV-2 by FDA under an Emergency Use Authorization (EUA). This EUA will remain  in effect (meaning this test can be used) for the duration of the COVID-19 declaration under Section 56 4(b)(1) of the Act, 21 U.S.C. section 360bbb-3(b)(1), unless the authorization is terminated or revoked sooner. Performed at [facility], [place] [address], [place], [place] [address]    Radiology Studies: DG CHEST PORT 1 VIEW  Result Date: 08/02/2019 CLINICAL DATA:  CHF EXAM: PORTABLE CHEST 1 VIEW COMPARISON:  August 01, 2019 FINDINGS: The heart size and mediastinal contours are unchanged with cardiomegaly. Aortic knob calcifications. Again noted is prominence of the central pulmonary vasculature. Small bilateral pleural effusions are again noted, right greater than left. IMPRESSION: No significant interval change in pulmonary vascular congestion and small bilateral pleural effusions. Electronically Signed   By: Prudencio Pair M.D.   On: 08/02/2019 04:51   DG Chest Port 1 View  Result Date: 08/01/2019 CLINICAL DATA:  Severe shortness of breath. EXAM: PORTABLE CHEST 1 VIEW COMPARISON:  07/31/2019 FINDINGS: Cardiac enlargement. Status post median sternotomy and mitral valve repair. Unchanged moderate right pleural effusion and pulmonary vascular congestion. Atelectasis versus infiltrate noted in the right lung base. IMPRESSION: No change in CHF pattern. Electronically Signed   By: Kerby Moors  M.D.   On: 08/01/2019 12:10   Scheduled Meds: . apixaban  5 mg Oral BID  . atorvastatin  40 mg Oral q1800  . busPIRone  10 mg Oral BID  . diltiazem  60 mg Oral Q6H  . DULoxetine  20 mg Oral Daily  . feeding supplement (ENSURE ENLIVE)  237 mL Oral Q24H  . ferrous sulfate  325 mg Oral Q breakfast  . fluticasone furoate-vilanterol  1 puff Inhalation Daily  . furosemide  30 mg Intravenous Q12H  . hydroxyurea  500 mg Oral Daily  . mouth rinse  15 mL Mouth Rinse BID  . metoprolol succinate  25 mg Oral Daily  . multivitamin with minerals  1 tablet Oral Daily   Continuous Infusions: . sodium chloride       LOS: 6 days   Time spent: 30 minutes  Clanford Wynetta Emery, MD [facility] How to contact the [facility] Attending or Consulting provider [place] or covering provider during after hours [place], for this patient?  1. Check the care team in [facility] and look for a) attending/consulting TR[facility] provider listed and b) the [facility] team listed 2. Log into [email or web address] and use [place]'s universal password to access. If you do not have the password, please contact the hospital operator. 3. Locate the [facility] provider you are looking for under [facility] and page to a number that you can be directly reached. 4. If you still have difficulty reaching the provider, please page the [facility] (Director on Call) for the Hospitalists listed on amion for assistance.   If 7PM-7AM, please contact night-coverage [email or web address] Password [facility] 08/02/2019,  2:00 PM

## 2019-08-02 NOTE — Progress Notes (Signed)
   08/02/19 1732  Vitals  Ectopy Ventricular tachycardia, non-sustained (5 beats VT called to [employer])   Patient asymptomatic lying in bed getting ready to eat evening meal. Murvin Natal, MD notified. No new orders at this time.

## 2019-08-02 NOTE — TOC Progression Note (Signed)
Transition of Care [facility]) - Progression Note    Patient Details  Name: Nicole [name] MRN: [id number] Date of Birth: 02/07/1936  Transition of Care [facility]) CM/SW Contact  Ihor Gully, LCSW Phone Number: 08/02/2019, 11:22 AM  Clinical Narrative:    Provided Christy at [facility] with update advising that patient is expected to discharge tomorrow.    Expected Discharge Plan: [place] Barriers to Discharge: Continued Medical Work up  Expected Discharge Plan and Services Expected Discharge Plan: [place] arrangements for the past 2 months: Apartment                                       Social Determinants of Health (SDOH) Interventions    Readmission Risk Interventions No flowsheet data found.

## 2019-08-02 NOTE — Progress Notes (Addendum)
Progress Note  Patient Name: Ensleigh Capilla Date of Encounter: 08/02/2019  Primary Cardiologist: [name], MD   Subjective   Mild improvement in SOB today  Inpatient Medications    Scheduled Meds: . apixaban  5 mg Oral BID  . atorvastatin  40 mg Oral q1800  . busPIRone  10 mg Oral BID  . diltiazem  60 mg Oral Q6H  . DULoxetine  20 mg Oral Daily  . feeding supplement (ENSURE ENLIVE)  237 mL Oral Q24H  . ferrous sulfate  325 mg Oral Q breakfast  . fluticasone furoate-vilanterol  1 puff Inhalation Daily  . furosemide  30 mg Intravenous Q12H  . hydroxyurea  500 mg Oral Daily  . mouth rinse  15 mL Mouth Rinse BID  . metoprolol succinate  25 mg Oral Daily  . multivitamin with minerals  1 tablet Oral Daily   Continuous Infusions: . sodium chloride     PRN Meds: acetaminophen, albuterol, ALPRAZolam, levalbuterol, ondansetron (ZOFRAN) IV, prochlorperazine   Vital Signs    Vitals:   08/02/19 0500 08/02/19 0608 08/02/19 0813 08/02/19 0907  BP:  113/60  110/84  Pulse:  76  95  Resp:  18    Temp:  98.5 F (36.9 C)    TempSrc:  Oral    SpO2:  98% 96%   Weight: 87 kg     Height:        Intake/Output Summary (Last 24 hours) at 08/02/2019 0918 Last data filed at 08/02/2019 0500 Gross per 24 hour  Intake 630 ml  Output 700 ml  Net -70 ml   Last 3 Weights 08/02/2019 08/01/2019 07/29/2019  Weight (lbs) 191 lb 12.8 oz 193 lb 2 oz 194 lb 10.7 oz  Weight (kg) 87 kg 87.6 kg 88.3 kg      Telemetry    Rate controlled afib - Personally Reviewed  ECG    n/a - Personally Reviewed  Physical Exam   GEN: No acute distress.   Neck: elevated JVD Cardiac: irreg, 2/6 systolic murmur rusb Respiratory: Clear to auscultation bilaterally. GI: Soft, nontender, non-distended  MS: 1+ bilateral LE edema; No deformity. Neuro:  Nonfocal  Psych: Normal affect   Labs    High Sensitivity Troponin:   Recent Labs  Lab 07/27/19 1529 07/27/19 1709 07/29/19 1712 07/29/19 1813    TROPONINIHS 17 18* 68* 70*      Chemistry Recent Labs  Lab 07/27/19 1529 07/28/19 0710 07/31/19 0502 08/01/19 0437 08/02/19 0410  NA 138   < > 137 137 140  K 3.8   < > 4.6 4.3 3.9  CL 100   < > 106 106 107  CO2 27   < > 24 23 25   GLUCOSE 146*   < > 113* 115* 99  BUN 21   < > 48* 38* 33*  CREATININE 1.20*   < > 1.41* 1.19* 1.22*  CALCIUM 9.0   < > 8.8* 8.7* 8.8*  PROT 6.6  --   --   --   --   ALBUMIN 3.8  --   --   --   --   AST 28  --   --   --   --   ALT 27  --   --   --   --   ALKPHOS 65  --   --   --   --   BILITOT 1.0  --   --   --   --   GFRNONAA 42*   < >  35* 42* 41*  GFRAA 49*   < > 40* 49* 48*  ANIONGAP 11   < > 7 8 8    < > = values in this interval not displayed.     Hematology Recent Labs  Lab 07/31/19 0502 07/31/19 0502 08/01/19 0437 08/01/19 2247 08/02/19 0410  WBC 15.6*  --  16.7*  --  13.1*  RBC 2.94*  --  3.02*  --  3.29*  HGB 7.6*   < > 7.7* 8.9* 8.7*  HCT 26.1*   < > 27.2* 29.7* 29.7*  MCV 88.8  --  90.1  --  90.3  MCH 25.9*  --  25.5*  --  26.4  MCHC 29.1*  --  28.3*  --  29.3*  RDW 17.6*  --  17.5*  --  17.1*  PLT 1,851*  --  1,903*  --  1,869*   < > = values in this interval not displayed.    BNP Recent Labs  Lab 07/28/19 1600 07/30/19 0454 08/01/19 0528  BNP 851.0* 822.0* 737.0*     DDimer No results for input(s): DDIMER in the last 168 hours.   Radiology    DG Chest 2 View  Result Date: 07/31/2019 CLINICAL DATA:  Severe shortness of breath, COPD, CHF, former smoker, atrial fibrillation EXAM: CHEST - 2 VIEW COMPARISON:  07/28/2019 FINDINGS: Enlargement of cardiac silhouette post median sternotomy, MVR, and LEFT atrial appendage clipping. Mediastinal contours and pulmonary vascularity normal. Atherosclerotic calcification aorta. RIGHT pleural effusion and basilar atelectasis slightly increased. Remaining lungs clear. No pleural effusion or pneumothorax. Bones demineralized. IMPRESSION: Enlargement of cardiac silhouette post  surgical changes. Increased RIGHT basilar atelectasis and pleural effusion. Electronically Signed   By: Lavonia Dana M.D.   On: 07/31/2019 09:44   DG CHEST PORT 1 VIEW  Result Date: 08/02/2019 CLINICAL DATA:  CHF EXAM: PORTABLE CHEST 1 VIEW COMPARISON:  August 01, 2019 FINDINGS: The heart size and mediastinal contours are unchanged with cardiomegaly. Aortic knob calcifications. Again noted is prominence of the central pulmonary vasculature. Small bilateral pleural effusions are again noted, right greater than left. IMPRESSION: No significant interval change in pulmonary vascular congestion and small bilateral pleural effusions. Electronically Signed   By: Prudencio Pair M.D.   On: 08/02/2019 04:51   DG Chest Port 1 View  Result Date: 08/01/2019 CLINICAL DATA:  Severe shortness of breath. EXAM: PORTABLE CHEST 1 VIEW COMPARISON:  07/31/2019 FINDINGS: Cardiac enlargement. Status post median sternotomy and mitral valve repair. Unchanged moderate right pleural effusion and pulmonary vascular congestion. Atelectasis versus infiltrate noted in the right lung base. IMPRESSION: No change in CHF pattern. Electronically Signed   By: Kerby Moors M.D.   On: 08/01/2019 12:10    Cardiac Studies    Patient Profile   [place] a 84 y.o.femalewith past medical history of permanent atrial fibrillation (failed prior DCCV, developed hyperthyroidism and junctional rhythm with Amiodarone in the past--> rate-control strategy pursued), valvular heart disease (s/p tissue AVR in 2014 with MV ring and maze procedure), chronic diastolic CHF, COPD, hyperthyroidism, HLD and CML with thrombocytosiswho is being seen today for the evaluation of atrial fibrillation with RVRat the request ofDr. Manuella Ghazi.   Assessment & Plan    1. Afib with RVR - had failed prior DCCV and was intolerant toAmiodarone in the past due to hyperthyroidism and junctional rhythm. Has been undergoing rate control strategy - low bp's this admission have  affected rate control - has been on dilt 240 and toprol 25mg  bid at  home. Due to low bp's during admission on dilt short acting 60mg  every 6 hours, toprol 25mg  just once daily SBPs essentially low 100s.  - remains on eliquis  - continue current regimen, likely consolidate to longer acting dilt once hemodynamics further settle out.  2. Chronic diastolic HF - diuretics had been held due to AKI and soft bp's - CXR showed increased right basilar atelectasis and pleural effusion - increased work of breathing yesterday. Dosed IV lasix 40mg  x 1 yesterday. BPs tolerated lasix dosing, renal functions stable with dosing. From I/Os essentially even yesterday  - new issues with low bp's, AKI with attempts at diuresis recently. Raises suspicion for possible change in cardiac function. Echo in Jan but with these clinical chagnes will repeat. CXR today ongoing pulm vascular congestion - started on low dose lasix 30mg  bid by primary team, continue and f/u echo today.   3. AKI  - baseline Cr around 0.9. Admitted with Cr 1.2, with fluids down to 1.1. On the morning of the 27th got torsemide 20mg  bid and also lasix 80mg  x 1, Cr up to 1.47 and then 1.66 on March 1st. Soft bp's on admit which seemed to worsen on 27th.  today Cr down to 1.2  4. Hypotension - in setting of pneumonia, diuresis - SBPs low 100s  5. COPD - per pulmonary  6. Pleural effusion - followed by pulmonary  For questions or updates, please contact [place] Please consult [email or web address] for contact info under        Signed, Carlyle Dolly, MD  08/02/2019, 9:18 AM

## 2019-08-02 NOTE — Consult Note (Signed)
[facility] On[facility] Progress Note  Name: Nicole [name]      MRN: [id number]    Location: A311/A311-01  Date: 08/02/2019 Time:5:36 PM   Subjective: Interval History:Nicole [name] seen for follow-up of CML.  She reports that she is feeling better in terms of breathing.  She was able to walk to the door back-and-forth couple of times.  She reports that she sat in the chair for few hours.  She denies any nausea or vomiting since she took hydroxyurea last night.  Objective: Vital signs in last 24 hours: Temp:  [98.2 F (36.8 C)-98.8 F (37.1 C)] 98.4 F (36.9 C) (03/04 1308) Pulse Rate:  [72-95] 84 (03/04 1308) Resp:  [18-22] 18 (03/04 0608) BP: (97-113)/(46-84) 105/46 (03/04 1308) SpO2:  [93 %-98 %] 97 % (03/04 1316) Weight:  [191 lb 12.8 oz (87 kg)] 191 lb 12.8 oz (87 kg) (03/04 0500)    Intake/Output from previous day: 03/03 0800 - 03/04 0759 In: 870 [P.O.:870] Out: 700 [Urine:700]    Intake/Output this shift: Total I/O In: 600 [P.O.:600] Out: 700 [Urine:700]   PHYSICAL EXAM: BP (!) 105/46 (BP Location: Right Arm)   Pulse 84   Temp 98.4 F (36.9 C) (Oral)   Resp 18   Ht 5\' 3"  (1.6 m)   Wt 191 lb 12.8 oz (87 kg)   SpO2 97% Comment: O2 sat range 91-97% with 2L O2 A via nasal cannula  BMI 33.98 kg/m  General appearance: alert, cooperative and appears stated age Head: Normocephalic, without obvious abnormality, atraumatic Lungs: Bilateral crackles at bases. Heart: Irregular rate and rhythm. Extremities: No edema or cyanosis. Skin: Skin color, texture, turgor normal. No rashes or lesions Neurologic: Grossly normal   Studies/Results: Results for orders placed or performed during the hospital encounter of 07/27/19 (from the past 48 hour(s))  Basic metabolic panel     Status: Abnormal   Collection Time: 08/01/19  4:37 AM  Result Value Ref Range   Sodium 137 135 - 145 mmol/L   Potassium 4.3 3.5 - 5.1 mmol/L   Chloride 106 98 - 111 mmol/L   CO2 23 22 - 32 mmol/L    Glucose, Bld 115 (H) 70 - 99 mg/dL    Comment: Glucose reference range applies only to samples taken after fasting for at least 8 hours.   BUN 38 (H) 8 - 23 mg/dL   Creatinine, Ser 1.19 (H) 0.44 - 1.00 mg/dL   Calcium 8.7 (L) 8.9 - 10.3 mg/dL   GFR calc non Af Amer 42 (L) >60 mL/min   GFR calc Af Amer 49 (L) >60 mL/min   Anion gap 8 5 - 15    Comment: Performed at [facility], [address], [place], [place] [address]  CBC     Status: Abnormal   Collection Time: 08/01/19  4:37 AM  Result Value Ref Range   WBC 16.7 (H) 4.0 - 10.5 K/uL   RBC 3.02 (L) 3.87 - 5.11 MIL/uL   Hemoglobin 7.7 (L) 12.0 - 15.0 g/dL   HCT 27.2 (L) 36.0 - 46.0 %   MCV 90.1 80.0 - 100.0 fL   MCH 25.5 (L) 26.0 - 34.0 pg   MCHC 28.3 (L) 30.0 - 36.0 g/dL   RDW 17.5 (H) 11.5 - 15.5 %   Platelets 1,903 (HH) 150 - 400 K/uL    Comment: SPECIMEN CHECKED FOR CLOTS CONSISTENT WITH PREVIOUS RESULT THIS CRITICAL RESULT HAS VERIFIED AND BEEN CALLED TO [facility] BY SHERRI HUFFINES ON 03 03 2021 AT  0554, AND HAS BEEN READ BACK.     nRBC 0.7 (H) 0.0 - 0.2 %    Comment: Performed at [facility], [address], [place], [place] [address]  SARS CORONAVIRUS 2 (TAT 6-24 HRS) Nasopharyngeal Nasopharyngeal Swab     Status: None   Collection Time: 08/01/19  4:45 AM   Specimen: Nasopharyngeal Swab  Result Value Ref Range   SARS Coronavirus 2 NEGATIVE NEGATIVE    Comment: (NOTE) SARS-CoV-2 target nucleic acids are NOT DETECTED. The SARS-CoV-2 RNA is generally detectable in upper and lower respiratory specimens during the acute phase of infection. Negative results do not preclude SARS-CoV-2 infection, do not rule out co-infections with other pathogens, and should not be used as the sole basis for treatment or other patient management decisions. Negative results must be combined with clinical observations, patient history, and epidemiological information. The expected result is Negative. Fact Sheet for  Patients: SugarRoll.be Fact Sheet for Healthcare Providers: https://www.woods-mathews.com/ This test is not yet approved or cleared by the [place] FDA and  has been authorized for detection and/or diagnosis of SARS-CoV-2 by FDA under an Emergency Use Authorization (EUA). This EUA will remain  in effect (meaning this test can be used) for the duration of the COVID-19 declaration under Section 56 4(b)(1) of the Act, 21 U.S.C. section 360bbb-3(b)(1), unless the authorization is terminated or revoked sooner. Performed at [facility], [place] [address], [place], [place] [address]   Brain natriuretic peptide     Status: Abnormal   Collection Time: 08/01/19  5:28 AM  Result Value Ref Range   B Natriuretic Peptide 737.0 (H) 0.0 - 100.0 pg/mL    Comment: Performed at [facility], [address] [place], [place] [address]  Prepare RBC     Status: None   Collection Time: 08/01/19  2:43 PM  Result Value Ref Range   Order Confirmation      ORDER PROCESSED BY BLOOD BANK Performed at [facility], [address], [place], [place] [address]   Type and screen [facility]     Status: None   Collection Time: 08/01/19  2:43 PM  Result Value Ref Range   ABO/RH(D) A POS    Antibody Screen NEG    Sample Expiration 08/04/2019,2359    Unit Number [id number]    Blood Component Type RED CELLS,LR    Unit division 00    Status of Unit ISSUED,FINAL    Transfusion Status OK TO TRANSFUSE    Crossmatch Result      Compatible Performed at [facility], [address], [place], [place] [address]   ABO/Rh     Status: None   Collection Time: 08/01/19  2:43 PM  Result Value Ref Range   ABO/RH(D)      A POS Performed at [facility], [address], [place], [place] [address]   Hemoglobin and hematocrit, blood     Status: Abnormal   Collection Time: 08/01/19 10:47 PM  Result Value Ref Range   Hemoglobin 8.9 (L) 12.0 - 15.0 g/dL   HCT 29.7 (L)  36.0 - 46.0 %    Comment: Performed at [facility], [address], [place],  [id number]  Basic metabolic panel     Status: Abnormal   Collection Time: 08/02/19  4:10 AM  Result Value Ref Range   Sodium 140 135 - 145 mmol/L   Potassium 3.9 3.5 - 5.1 mmol/L   Chloride 107 98 - 111 mmol/L   CO2 25 22 -  32 mmol/L   Glucose, Bld 99 70 - 99 mg/dL    Comment: Glucose reference range applies only to samples taken after fasting for at least 8 hours.   BUN 33 (H) 8 - 23 mg/dL   Creatinine, Ser 1.22 (H) 0.44 - 1.00 mg/dL   Calcium 8.8 (L) 8.9 - 10.3 mg/dL   GFR calc non Af Amer 41 (L) >60 mL/min   GFR calc Af Amer 48 (L) >60 mL/min   Anion gap 8 5 - 15    Comment: Performed at [facility], [address], [place], [place] [address]  CBC     Status: Abnormal   Collection Time: 08/02/19  4:10 AM  Result Value Ref Range   WBC 13.1 (H) 4.0 - 10.5 K/uL   RBC 3.29 (L) 3.87 - 5.11 MIL/uL   Hemoglobin 8.7 (L) 12.0 - 15.0 g/dL   HCT 29.7 (L) 36.0 - 46.0 %   MCV 90.3 80.0 - 100.0 fL   MCH 26.4 26.0 - 34.0 pg   MCHC 29.3 (L) 30.0 - 36.0 g/dL   RDW 17.1 (H) 11.5 - 15.5 %   Platelets 1,869 (HH) 150 - 400 K/uL    Comment: This critical result has verified and been called to [facility] by Senaida Lange on 03 04 2021 at 0558, and has been read back.    nRBC 0.6 (H) 0.0 - 0.2 %    Comment: Performed at [facility], [address] [place], [place] [address]  Magnesium     Status: Abnormal   Collection Time: 08/02/19  4:10 AM  Result Value Ref Range   Magnesium 2.7 (H) 1.7 - 2.4 mg/dL    Comment: Performed at [facility], [address] [place], [place] [address]   DG CHEST PORT 1 VIEW  Result Date: 08/02/2019 CLINICAL DATA:  CHF EXAM: PORTABLE CHEST 1 VIEW COMPARISON:  August 01, 2019 FINDINGS: The heart size and mediastinal contours are unchanged with cardiomegaly. Aortic knob calcifications. Again noted is prominence of the central pulmonary vasculature. Small bilateral pleural effusions are  again noted, right greater than left. IMPRESSION: No significant interval change in pulmonary vascular congestion and small bilateral pleural effusions. Electronically Signed   By: Prudencio Pair M.D.   On: 08/02/2019 04:51   DG Chest Port 1 View  Result Date: 08/01/2019 CLINICAL DATA:  Severe shortness of breath. EXAM: PORTABLE CHEST 1 VIEW COMPARISON:  07/31/2019 FINDINGS: Cardiac enlargement. Status post median sternotomy and mitral valve repair. Unchanged moderate right pleural effusion and pulmonary vascular congestion. Atelectasis versus infiltrate noted in the right lung base. IMPRESSION: No change in CHF pattern. Electronically Signed   By: Kerby Moors M.D.   On: 08/01/2019 12:10   ECHOCARDIOGRAM COMPLETE  Result Date: 08/02/2019    ECHOCARDIOGRAM REPORT   Patient Name:   Nicole [name] Date of Exam: 08/02/2019 Medical Rec #:  [id number]   Height:       63.0 in Accession #:    [id number]  Weight:       191.8 lb Date of Birth:  1935/07/31   BSA:          1.900 m Patient Age:    84 years    BP:           110/84 mmHg Patient Gender: F           HR:           95 bpm. Exam Location:  [facility] Procedure: 2D Echo, Cardiac Doppler and Color  Doppler Indications:    Dyspnea 786.09 / R06.00  History:        Patient has prior history of Echocardiogram examinations, most                 recent 06/25/2019. CHF, CAD, COPD, Aortic Valve Disease and                 Mitral Valve Disease, Arrythmias:Atrial Fibrillation; Risk                 Factors:Dyslipidemia. Acute respiratory failure with hypoxia,                 s/p tissue AVR in 2014 with MV ring and maze procedure.  Sonographer:    Alvino Chapel RCS Referring Phys: [id number] [place]  1. Left ventricular ejection fraction, by estimation, is 60 to 65%. The left ventricle has normal function. The left ventricle has no regional wall motion abnormalities. There is mild There is basal septal hypertrophy left ventricular hypertrophy. Left ventricular  diastolic parameters are indeterminate.  2. Right ventricular systolic function is low normal. The right ventricular size is mildly enlarged. There is moderately elevated pulmonary artery systolic pressure.  3. Left atrial size was severely dilated.  4. Right atrial size was severely dilated.  5. 26 mm Sorin 3-D Memo Ring is in the MV anular position. Mild mean gradient across the repaired valve of 6 mmHg. . The mitral valve has been repaired/replaced. No evidence of mitral valve regurgitation.  6. 21 mm Edwards Magna-ease pericardial valve is in the AV position. Average mean gradient is measured at 24 mmHg (within range of normal values for valve) The anatomy of the AV is poorly visualized. . The aortic valve has been repaired/replaced. Aortic  valve regurgitation is not visualized. FINDINGS  Left Ventricle: Left ventricular ejection fraction, by estimation, is 60 to 65%. The left ventricle has normal function. The left ventricle has no regional wall motion abnormalities. The left ventricular internal cavity size was normal in size. There is  mild There is basal septal hypertrophy left ventricular hypertrophy. Left ventricular diastolic parameters are indeterminate. Right Ventricle: The right ventricular size is mildly enlarged. Right vetricular wall thickness was not assessed. Right ventricular systolic function is low normal. There is moderately elevated pulmonary artery systolic pressure. The tricuspid regurgitant velocity is 3.09 m/s, and with an assumed right atrial pressure of 10 mmHg, the estimated right ventricular systolic pressure is 99991111 mmHg. Left Atrium: Left atrial size was severely dilated. Right Atrium: Right atrial size was severely dilated. Pericardium: There is no evidence of pericardial effusion. Mitral Valve: 26 mm Sorin 3-D Memo Ring is in the MV anular position. Mild mean gradient across the repaired valve of 6 mmHg. The mitral valve has been repaired/replaced. No evidence of mitral valve  regurgitation. MV peak gradient, 25.5 mmHg. The mean mitral valve gradient is 7.5 mmHg. Tricuspid Valve: The tricuspid valve is normal in structure. Tricuspid valve regurgitation is mild. Aortic Valve: 21 mm Edwards Magna-ease pericardial valve is in the AV position. Average mean gradient is measured at 24 mmHg (within range of normal values for valve) The anatomy of the AV is poorly visualized. The aortic valve has been repaired/replaced. Aortic valve regurgitation is not visualized. Aortic valve mean gradient measures 24.3 mmHg. Aortic valve peak gradient measures 42.3 mmHg. Aortic valve area, by VTI measures 1.01 cm. Pulmonic Valve: The pulmonic valve was not well visualized. Pulmonic valve regurgitation is not visualized. No evidence of pulmonic  stenosis. Aorta: The aortic root is normal in size and structure. IAS/Shunts: No atrial level shunt detected by color flow Doppler.  LEFT VENTRICLE PLAX 2D LVIDd:         3.83 cm LVIDs:         2.36 cm LV PW:         1.05 cm LV IVS:        1.24 cm LVOT diam:     1.80 cm LV SV:         64 LV SV Index:   34 LVOT Area:     2.54 cm  LEFT ATRIUM             Index       RIGHT ATRIUM           Index LA diam:        4.50 cm 2.37 cm/m  RA Area:     30.05 cm LA Vol (A2C):   83.5 ml 43.96 ml/m RA Volume:   113.65 ml 59.83 ml/m LA Vol (A4C):   85.7 ml 45.12 ml/m LA Biplane Vol: 89.7 ml 47.22 ml/m  AORTIC VALVE AV Area (Vmax):    0.94 cm AV Area (Vmean):   0.98 cm AV Area (VTI):     1.01 cm AV Vmax:           325.28 cm/s AV Vmean:          229.761 cm/s AV VTI:            0.630 m AV Peak Grad:      42.3 mmHg AV Mean Grad:      24.3 mmHg LVOT Vmax:         120.50 cm/s LVOT Vmean:        88.450 cm/s LVOT VTI:          0.250 m LVOT/AV VTI ratio: 0.40  AORTA Ao Root diam: 2.90 cm MITRAL VALVE                TRICUSPID VALVE MV Area (PHT): 2.11 cm     TR Peak grad:   38.2 mmHg MV Peak grad:  25.5 mmHg    TR Vmax:        309.00 cm/s MV Mean grad:  7.5 mmHg MV Vmax:       2.53 m/s      SHUNTS MV Vmean:      112.7 cm/s   Systemic VTI:  0.25 m MV Decel Time: 359 msec     Systemic Diam: 1.80 cm MV E velocity: 211.00 cm/s Carlyle Dolly MD Electronically signed by Carlyle Dolly MD Signature Date/Time: 08/02/2019/4:00:55 PM    Final      MEDICATIONS: I have reviewed the patient's current medications.     Assessment/Plan:  1.  CML in chronic phase: -She did receive hydroxyurea 500 mg last night around 830. -CBC today shows platelet count 1869.  Hemoglobin is 8.7 after 1 unit of blood transfusion.  White count was 13.1. -She also received 1 dose of hydroxyurea this morning around 9:00. -I will increase the dose of hydroxyurea to 1000 mg daily.  I have called and talked to the pharmacy.  They will give 500 mg dose today and start her on 1000 mg tomorrow morning around 10 AM. -She will start [place] as outpatient.  I have reached out to [place] our navigator, who will try to get her assistance with co-pays of [place]. -I have called and talked to her daughter [name].  2.  A. fib with RVR: -She is on Eliquis, diltiazem and Toprol-XL.  3.  Chronic diastolic HF: -Chest x-ray today shows no significant interval change in pulmonary vascular congestion and small bilateral pleural effusions. -2D echocardiogram today showed EF 60-65%.  LV has normal function.  Left ventricular diastolic parameters are indeterminate. -Being given Lasix as needed.  All questions were answered. The patient knows to call the clinic with any problems, questions or concerns. We can certainly see the patient much sooner if necessary.     Derek Jack

## 2019-08-02 NOTE — Progress Notes (Signed)
   08/02/19 0700  Provider Notification  Provider Name/Title Dr. Darrick Meigs  Date Provider Notified 08/02/19  Time Provider Notified 0700  Notification Type Page  Notification Reason Other (Comment) (Platelets- [phone] drawn on 08/02/2019 at 0410. reported by [place])  Response Other (Comment) (waiting )

## 2019-08-02 NOTE — Progress Notes (Signed)
Physical Therapy Treatment Patient Details Name: Nicole [name] MRN: [id number] DOB: 12-14-1935 Today's Date: 08/02/2019    History of Present Illness Nicole [name] is a 84 y.o. female with a history of COPD, diastolic heart failure, atrial fibrillation on anticoagulation, CML with thrombocytosis.  She was recently admitted about a month ago for similar complaints.  She DU with A. fib and RVR.  She did complete doxycycline for total of 5 days for bronchitis/bronchiectasis.  Today, she presents with 1 week of worsening shortness of breath which has been worsening and is now severe.  Symptoms are worse with ambulation and improved with rest.  No other palliating or provoking factors.  Have mild cough.  She had been prescribed doxycycline and prednisone by her oncology physician, although she has not been get these medications.    PT Comments    Pt limited by SOB and fatigue following bed mobility and short distance ambulation.  Increased time required for pt to catch her breath following any movement.  No LOB episodes during gait, slow cadence with use of RW for balance assistance.  O2 saturation range from 91-97% during session with 2L O2 A via nasal cannula.  EOS pt left in chair with call bell within reach and NT in room.     Follow Up Recommendations  SNF;Supervision for mobility/OOB;Supervision - Intermittent     Equipment Recommendations  None recommended by PT    Recommendations for Other Services       Precautions / Restrictions Precautions Precautions: Fall Restrictions Weight Bearing Restrictions: No    Mobility  Bed Mobility Overal bed mobility: Needs Assistance       Supine to sit: Min assist     General bed mobility comments: increased time, labored movement, required use of bed rail  Transfers Overall transfer level: Needs assistance Equipment used: Rolling walker (2 wheeled)   Sit to Stand: Min assist         General transfer comment: slow labored  movement  Ambulation/Gait Ambulation/Gait assistance: Min assist Gait Distance (Feet): 8 Feet   Gait Pattern/deviations: Decreased step length - right;Decreased step length - left;Decreased stride length Gait velocity: decreased   General Gait Details: slow labored cadence with occasional standing rest breaks due to difficulty breathing, Sp O2 range 92-97% on 2L nasal cannula, limited secondary to c/o fatigue, patient very SOB throughout ambulation   Stairs             Wheelchair Mobility    Modified Rankin (Stroke Patients Only)       Balance                                            Cognition Arousal/Alertness: Awake/alert Behavior During Therapy: WFL for tasks assessed/performed Overall Cognitive Status: Within Functional Limits for tasks assessed                                        Exercises      General Comments        Pertinent Vitals/Pain Pain Assessment: No/denies pain    Home Living                      Prior Function            PT Goals (current goals  can now be found in the care plan section)      Frequency    Min 3X/week      PT Plan Current plan remains appropriate    Co-evaluation              AM-PAC PT "6 Clicks" Mobility   Outcome Measure  Help needed turning from your back to your side while in a flat bed without using bedrails?: None Help needed moving from lying on your back to sitting on the side of a flat bed without using bedrails?: A Little Help needed moving to and from a bed to a chair (including a wheelchair)?: A Little Help needed standing up from a chair using your arms (e.g., wheelchair or bedside chair)?: A Little Help needed to walk in hospital room?: A Little Help needed climbing 3-5 steps with a railing? : A Lot 6 Click Score: 18    End of Session Equipment Utilized During Treatment: Oxygen;Gait belt Activity Tolerance: Patient tolerated treatment  well;Patient limited by fatigue Patient left: in chair;with call bell/phone within reach Nurse Communication: Mobility status PT Visit Diagnosis: Unsteadiness on feet (R26.81);Other abnormalities of gait and mobility (R26.89);Muscle weakness (generalized) (M62.81)     Time: 0950-1030 PT Time Calculation (min) (ACUTE ONLY): 40 min  Charges:  $Therapeutic Activity: 38-52 mins                     Ihor Austin, LPTA/CLT; CBIS [phone]  Aldona Lento 08/02/2019, 1:24 PM

## 2019-08-02 NOTE — Progress Notes (Signed)
NAME:  Nicole [name], MRN:  [id number], DOB:  1936/04/03, LOS: 6 ADMISSION DATE:  07/27/2019, CONSULTATION DATE:  07/30/2019 REFERRING MD:  Dr. Manuella Ghazi, [facility], CHIEF COMPLAINT:  Short of breath  Brief History   84 yo female former smoker presented with worsening dyspnea and cough.  Had recent course of doxycycline and prednisone as outpt prior to admission.  Admitted with recurrent COPD exacerbation and HCAP.  History of present illness   She was in hospital recently with A fib and CHF.  Has noticed more trouble with her breathing since.  She was started on ABx for possible HCAP.  She has been getting diuretics.  She has been using breo.  She feels this helps, but concerned about expense.  Transitioned to room air at rest, but was told by PT that she would need supplemental oxygen with activity.  Leg swelling has gotten better.  Has cough with beige colored sputum.  Denies sinus congestion, sore throat, dysphagia, hemoptysis, chest pain, nausea.  Gets intermittent discomfort in abdomen from large ventral hernia, but these symptoms usually don't last long and resolve spontaneously.  Past Medical History  COPD, A fib, CML with thrombocytosis, Chronic diastolic CHF, severe AS and moderate MR s/p bioprosthetic AVR/MV ring/MAZE 2014, Amiodarone induced hyperthyroidism  Significant Hospital Events   2/26 Admit 3/03 transfuse 1 unit PRBC  Consults:  Cardiology Hematology/oncology  Procedures:    Significant Diagnostic Tests:  PFT 10/30/13 >> FEV1 1.34 (68%), FEV1% 73, TLC 3.60 (73%), DLCO 50%, +BD Echo 06/25/19 >> EF 55 to [id number], mild RV systolic dysfx, severe LA and RA dilation, s/p MVR, moderately elevated PASP CT angio chest 06/24/19 >> atherosclerosis, clipping of LA, 4.6 x 3.2 Lt thyroid enlargement, tiny pleural effusions, kyphosis, large ventral hernia  Micro Data:  SARS CoV2 PCR 2/26 >> negative Influenza PCR 2/26 >> negative Pneumococcal Ag 2/26 >> negative  Antimicrobials:  Vancomycin  2/26 >> 2/27 Cefepime 2/26 >> 3/02  Interim history/subjective:  Received 1 unit PRBC yesterday and feels more energy today.  Not having chest pain.  Cough less.  Objective   Blood pressure 110/84, pulse 95, temperature 98.5 F (36.9 C), temperature source Oral, resp. rate 18, height 5\' 3"  (1.6 m), weight 87 kg, SpO2 96 %.        Intake/Output Summary (Last 24 hours) at 08/02/2019 1222 Last data filed at 08/02/2019 [id number] Gross per 24 hour  Intake 990 ml  Output 1400 ml  Net -410 ml   Filed Weights   07/29/19 0744 08/01/19 0500 08/02/19 0500  Weight: 88.3 kg 87.6 kg 87 kg    Examination:  General - alert Eyes - pupils reactive ENT - no sinus tenderness, no stridor Cardiac - irregular Chest - faint basilar crackles, no wheeze Abdomen - soft, non tender, + bowel sounds Extremities - ankle edema Skin - no rashes Neuro - normal strength, moves extremities, follows commands Psych - normal mood and behavior  CXR (reviewed by me) - no change in small b/l effusions  Discussion:  84 yo female smoker with multifactorial dyspnea: 1) COPD exacerbation with hx of COPD and positive bronchodilator response, 2) possible HCAP, 3) Acute on chronic diastolic CHF with hx of A fib and valvular heart disease, 4) restrictive lung disease from kyphosis, obesity and ventral hernia, 5) anemia, 6) deconditioning.  It seems that most of her issues at present are chronic in nature rather than an acute reversible process.  The only condition that hasn't been addressed yet is CML with thrombocytosis.  She wasn't started on Gleevac yet.  She has been seen by hematology/oncology and started on hydroxyurea.  Assessment & Plan:   COPD exacerbation with hx of COPD and positive bronchodilator response. - completed prednisone taper - continue breo with prn albuterol - will need to look into less expensive option for LABA/ICS as outpt - goal SpO2 > 92%  Rt atelectasis and effusion. - bronchial hygiene,  diuresis - f/u CXR if symptoms progress  Restrictive lung disease. - from kyphosis, large ventral hernia, obesity - weight reduction  Permanent A fib s/p MAZE. Acute on chronic diastolic CHF. Valvular heart disease s/p bioprosthetic AVR and mitral annuloplasty. Hx of HLD. - continue apixaban, atorvastatin, diltiazem, toprol XL - cardiology following  Goiter. - f/u with PCP as outpt  Hx of CML with thrombocytosis. Anemia of chronic disease. - in chronic phase - Dr. Delton Coombes consulted form hematology/oncology - declined bone marrow bx at outpt visit on 07/24/19 - plan was to start Gleevac as outpt >> hasn't been started yet - started on hydroxyurea since Gleevac can't be started in hospital - f/u CBC  Deconditioning. - PT/OT - recommending SNF  Goals of care. - DNR/DNI - spoke with pt's daughter.  She understands Nicole [name] has several chronic conditions, but reports she was living independently until a few months ago.  She would like to see if there are any options for treating CML before she would consider transitioning goals of care  Resolved problems:  HCAP, AKI from diuresis  Best practice:  Diet: heart healthy DVT prophylaxis: apixiban GI prophylaxis: not indicated Mobility: OOB Code Status: DNR Disposition: telemetry  Labs    CMP Latest Ref Rng & Units 08/02/2019 08/01/2019 07/31/2019  Glucose 70 - 99 mg/dL 99 115(H) 113(H)  BUN 8 - 23 mg/dL 33(H) 38(H) 48(H)  Creatinine 0.44 - 1.00 mg/dL 1.22(H) 1.19(H) 1.41(H)  Sodium 135 - 145 mmol/L 140 137 137  Potassium 3.5 - 5.1 mmol/L 3.9 4.3 4.6  Chloride 98 - 111 mmol/L 107 106 106  CO2 22 - 32 mmol/L 25 23 24   Calcium 8.9 - 10.3 mg/dL 8.8(L) 8.7(L) 8.8(L)  Total Protein 6.5 - 8.1 g/dL - - -  Total Bilirubin 0.3 - 1.2 mg/dL - - -  Alkaline Phos 38 - 126 U/L - - -  AST 15 - 41 U/L - - -  ALT 0 - 44 U/L - - -    CBC Latest Ref Rng & Units 08/02/2019 08/01/2019 08/01/2019  WBC 4.0 - 10.5 K/uL 13.1(H) - 16.7(H)   Hemoglobin 12.0 - 15.0 g/dL 8.7(L) 8.9(L) 7.7(L)  Hematocrit 36.0 - 46.0 % 29.7(L) 29.7(L) 27.2(L)  Platelets 150 - 400 K/uL 1,869(HH) - 1,903(HH)    Chesley Mires, MD [place] 08/02/2019, 12:22 PM

## 2019-08-02 NOTE — Progress Notes (Signed)
*  PRELIMINARY RESULTS* Echocardiogram 2D Echocardiogram has been performed.  Nicole [name] 08/02/2019, 2:04 PM

## 2019-08-02 NOTE — Plan of Care (Signed)
  Problem: Education: Goal: Knowledge of General Education information will improve Description: Including pain rating scale, medication(s)/side effects and non-pharmacologic comfort measures Outcome: Progressing   Problem: Nutrition: Goal: Adequate nutrition will be maintained Outcome: Progressing   Problem: Elimination: Goal: Will not experience complications related to urinary retention Outcome: Progressing   Problem: Nutrition: Goal: Adequate nutrition will be maintained Outcome: Progressing   Problem: Respiratory: Goal: Levels of oxygenation will improve Outcome: Progressing

## 2019-08-03 DIAGNOSIS — J9601 Acute respiratory failure with hypoxia: Secondary | ICD-10-CM

## 2019-08-03 LAB — TYPE AND SCREEN
ABO/RH(D): A POS
Antibody Screen: NEGATIVE
Unit division: 0

## 2019-08-03 LAB — CBC
HCT: 32.1 % — ABNORMAL LOW (ref 36.0–46.0)
Hemoglobin: 9.3 g/dL — ABNORMAL LOW (ref 12.0–15.0)
MCH: 26.1 pg (ref 26.0–34.0)
MCHC: 29 g/dL — ABNORMAL LOW (ref 30.0–36.0)
MCV: 89.9 fL (ref 80.0–100.0)
Platelets: 2201 10*3/uL (ref 150–400)
RBC: 3.57 MIL/uL — ABNORMAL LOW (ref 3.87–5.11)
RDW: 17.2 % — ABNORMAL HIGH (ref 11.5–15.5)
WBC: 15.3 10*3/uL — ABNORMAL HIGH (ref 4.0–10.5)
nRBC: 0 % (ref 0.0–0.2)

## 2019-08-03 LAB — BASIC METABOLIC PANEL
Anion gap: 6 (ref 5–15)
BUN: 25 mg/dL — ABNORMAL HIGH (ref 8–23)
CO2: 28 mmol/L (ref 22–32)
Calcium: 8.7 mg/dL — ABNORMAL LOW (ref 8.9–10.3)
Chloride: 105 mmol/L (ref 98–111)
Creatinine, Ser: 1.09 mg/dL — ABNORMAL HIGH (ref 0.44–1.00)
GFR calc Af Amer: 55 mL/min — ABNORMAL LOW (ref >60)
GFR calc non Af Amer: 47 mL/min — ABNORMAL LOW (ref >60)
Glucose, Bld: 126 mg/dL — ABNORMAL HIGH (ref 70–99)
Potassium: 3.7 mmol/L (ref 3.5–5.1)
Sodium: 139 mmol/L (ref 135–145)

## 2019-08-03 LAB — BPAM RBC
Blood Product Expiration Date: 202103212359
ISSUE DATE / TIME: 202103031702
Unit Type and Rh: 6200

## 2019-08-03 MED ORDER — FUROSEMIDE 10 MG/ML IJ SOLN
30.0000 mg | Freq: Two times a day (BID) | INTRAMUSCULAR | Status: DC
  Administered 2019-08-03: 30 mg via INTRAVENOUS
  Filled 2019-08-03 (×2): qty 4

## 2019-08-03 MED ORDER — DOCUSATE SODIUM 100 MG PO CAPS
200.0000 mg | ORAL_CAPSULE | Freq: Every day | ORAL | Status: DC
  Administered 2019-08-03 – 2019-08-07 (×3): 200 mg via ORAL
  Filled 2019-08-03 (×6): qty 2

## 2019-08-03 MED ORDER — BUDESONIDE 0.5 MG/2ML IN SUSP
0.5000 mg | Freq: Two times a day (BID) | RESPIRATORY_TRACT | Status: DC
  Administered 2019-08-03 – 2019-08-09 (×13): 0.5 mg via RESPIRATORY_TRACT
  Filled 2019-08-03 (×13): qty 2

## 2019-08-03 MED ORDER — ARFORMOTEROL TARTRATE 15 MCG/2ML IN NEBU
15.0000 ug | INHALATION_SOLUTION | Freq: Two times a day (BID) | RESPIRATORY_TRACT | Status: DC
  Administered 2019-08-03 – 2019-08-09 (×13): 15 ug via RESPIRATORY_TRACT
  Filled 2019-08-03 (×13): qty 2

## 2019-08-03 MED ORDER — POLYETHYLENE GLYCOL 3350 17 G PO PACK
17.0000 g | PACK | Freq: Two times a day (BID) | ORAL | Status: DC
  Administered 2019-08-03 – 2019-08-04 (×3): 17 g via ORAL
  Filled 2019-08-03 (×5): qty 1

## 2019-08-03 NOTE — Plan of Care (Signed)
  Problem: Education: Goal: Knowledge of General Education information will improve Description: Including pain rating scale, medication(s)/side effects and non-pharmacologic comfort measures Outcome: Progressing   Problem: Skin Integrity: Goal: Risk for impaired skin integrity will decrease Outcome: Progressing   Problem: Safety: Goal: Ability to remain free from injury will improve Outcome: Progressing   Problem: Pain Managment: Goal: General experience of comfort will improve Outcome: Progressing   Problem: Respiratory: Goal: Ability to maintain a clear airway will improve Outcome: Progressing   Problem: Respiratory: Goal: Levels of oxygenation will improve Outcome: Progressing   Problem: Respiratory: Goal: Ability to maintain adequate ventilation will improve Outcome: Progressing

## 2019-08-03 NOTE — Progress Notes (Signed)
Progress Note  Patient Name: Nicole [name] Date of Encounter: 08/03/2019  Primary Cardiologist: [name], MD   Subjective   SOB improving.   Inpatient Medications    Scheduled Meds: . apixaban  5 mg Oral BID  . atorvastatin  40 mg Oral q1800  . busPIRone  10 mg Oral BID  . diltiazem  60 mg Oral Q6H  . DULoxetine  20 mg Oral Daily  . feeding supplement (ENSURE ENLIVE)  237 mL Oral Q24H  . ferrous sulfate  325 mg Oral Q breakfast  . fluticasone furoate-vilanterol  1 puff Inhalation Daily  . hydroxyurea  1,000 mg Oral Daily  . mouth rinse  15 mL Mouth Rinse BID  . metoprolol succinate  25 mg Oral Daily  . multivitamin with minerals  1 tablet Oral Daily   Continuous Infusions: . sodium chloride     PRN Meds: acetaminophen, albuterol, ALPRAZolam, levalbuterol, ondansetron (ZOFRAN) IV, prochlorperazine   Vital Signs    Vitals:   08/03/19 0023 08/03/19 0500 08/03/19 0651 08/03/19 0838  BP: 117/69  129/67   Pulse: 85  72   Resp:      Temp:      TempSrc:      SpO2:    92%  Weight:  86.4 kg    Height:        Intake/Output Summary (Last 24 hours) at 08/03/2019 0850 Last data filed at 08/03/2019 0500 Gross per 24 hour  Intake 1040 ml  Output 2350 ml  Net -1310 ml   Last 3 Weights 08/03/2019 08/02/2019 08/01/2019  Weight (lbs) 190 lb 7.6 oz 191 lb 12.8 oz 193 lb 2 oz  Weight (kg) 86.4 kg 87 kg 87.6 kg      Telemetry    afib 90s to low 100s- Personally Reviewed  ECG    n/a - Personally Reviewed  Physical Exam   GEN: No acute distress.   Neck: mildly elevated JVD Cardiac: irreg, [id number] systolic murmur rusb, Respiratory: Clear to auscultation bilaterally. GI: Soft, nontender, non-distended  MS: No edema; No deformity. Neuro:  Nonfocal  Psych: Normal affect   Labs    High Sensitivity Troponin:   Recent Labs  Lab 07/27/19 1529 07/27/19 1709 07/29/19 1712 07/29/19 1813  TROPONINIHS 17 18* 68* 70*      Chemistry Recent Labs  Lab 07/27/19 1529  07/28/19 0710 07/31/19 0502 08/01/19 0437 08/02/19 0410  NA 138   < > 137 137 140  K 3.8   < > 4.6 4.3 3.9  CL 100   < > 106 106 107  CO2 27   < > 24 23 25   GLUCOSE 146*   < > 113* 115* 99  BUN 21   < > 48* 38* 33*  CREATININE 1.20*   < > 1.41* 1.19* 1.22*  CALCIUM 9.0   < > 8.8* 8.7* 8.8*  PROT 6.6  --   --   --   --   ALBUMIN 3.8  --   --   --   --   AST 28  --   --   --   --   ALT 27  --   --   --   --   ALKPHOS 65  --   --   --   --   BILITOT 1.0  --   --   --   --   GFRNONAA 42*   < > 35* 42* 41*  GFRAA 49*   < > 40* 49*  48*  ANIONGAP 11   < > 7 8 8    < > = values in this interval not displayed.     Hematology Recent Labs  Lab 07/31/19 0502 07/31/19 0502 08/01/19 0437 08/01/19 2247 08/02/19 0410  WBC 15.6*  --  16.7*  --  13.1*  RBC 2.94*  --  3.02*  --  3.29*  HGB 7.6*   < > 7.7* 8.9* 8.7*  HCT 26.1*   < > 27.2* 29.7* 29.7*  MCV 88.8  --  90.1  --  90.3  MCH 25.9*  --  25.5*  --  26.4  MCHC 29.1*  --  28.3*  --  29.3*  RDW 17.6*  --  17.5*  --  17.1*  PLT 1,851*  --  1,903*  --  1,869*   < > = values in this interval not displayed.    BNP Recent Labs  Lab 07/28/19 1600 07/30/19 0454 08/01/19 0528  BNP 851.0* 822.0* 737.0*     DDimer No results for input(s): DDIMER in the last 168 hours.   Radiology    DG CHEST PORT 1 VIEW  Result Date: 08/02/2019 CLINICAL DATA:  CHF EXAM: PORTABLE CHEST 1 VIEW COMPARISON:  August 01, 2019 FINDINGS: The heart size and mediastinal contours are unchanged with cardiomegaly. Aortic knob calcifications. Again noted is prominence of the central pulmonary vasculature. Small bilateral pleural effusions are again noted, right greater than left. IMPRESSION: No significant interval change in pulmonary vascular congestion and small bilateral pleural effusions. Electronically Signed   By: Prudencio Pair M.D.   On: 08/02/2019 04:51   DG Chest Port 1 View  Result Date: 08/01/2019 CLINICAL DATA:  Severe shortness of breath. EXAM:  PORTABLE CHEST 1 VIEW COMPARISON:  07/31/2019 FINDINGS: Cardiac enlargement. Status post median sternotomy and mitral valve repair. Unchanged moderate right pleural effusion and pulmonary vascular congestion. Atelectasis versus infiltrate noted in the right lung base. IMPRESSION: No change in CHF pattern. Electronically Signed   By: Kerby Moors M.D.   On: 08/01/2019 12:10   ECHOCARDIOGRAM COMPLETE  Result Date: 08/02/2019    ECHOCARDIOGRAM REPORT   Patient Name:   Nicole [name] Date of Exam: 08/02/2019 Medical Rec #:  [id number]   Height:       63.0 in Accession #:    [id number]  Weight:       191.8 lb Date of Birth:  1935-09-23   BSA:          1.900 m Patient Age:    84 years    BP:           110/84 mmHg Patient Gender: F           HR:           95 bpm. Exam Location:  [facility] Procedure: 2D Echo, Cardiac Doppler and Color Doppler Indications:    Dyspnea 786.09 / R06.00  History:        Patient has prior history of Echocardiogram examinations, most                 recent 06/25/2019. CHF, CAD, COPD, Aortic Valve Disease and                 Mitral Valve Disease, Arrythmias:Atrial Fibrillation; Risk                 Factors:Dyslipidemia. Acute respiratory failure with hypoxia,                 s/p tissue  AVR in 2014 with MV ring and maze procedure.  Sonographer:    Alvino Chapel RCS Referring Phys: [id number] [place]  1. Left ventricular ejection fraction, by estimation, is 60 to 65%. The left ventricle has normal function. The left ventricle has no regional wall motion abnormalities. There is mild There is basal septal hypertrophy left ventricular hypertrophy. Left ventricular diastolic parameters are indeterminate.  2. Right ventricular systolic function is low normal. The right ventricular size is mildly enlarged. There is moderately elevated pulmonary artery systolic pressure.  3. Left atrial size was severely dilated.  4. Right atrial size was severely dilated.  5. 26 mm Sorin 3-D Memo Ring is  in the MV anular position. Mild mean gradient across the repaired valve of 6 mmHg. . The mitral valve has been repaired/replaced. No evidence of mitral valve regurgitation.  6. 21 mm Edwards Magna-ease pericardial valve is in the AV position. Average mean gradient is measured at 24 mmHg (within range of normal values for valve) The anatomy of the AV is poorly visualized. . The aortic valve has been repaired/replaced. Aortic  valve regurgitation is not visualized. FINDINGS  Left Ventricle: Left ventricular ejection fraction, by estimation, is 60 to 65%. The left ventricle has normal function. The left ventricle has no regional wall motion abnormalities. The left ventricular internal cavity size was normal in size. There is  mild There is basal septal hypertrophy left ventricular hypertrophy. Left ventricular diastolic parameters are indeterminate. Right Ventricle: The right ventricular size is mildly enlarged. Right vetricular wall thickness was not assessed. Right ventricular systolic function is low normal. There is moderately elevated pulmonary artery systolic pressure. The tricuspid regurgitant velocity is 3.09 m/s, and with an assumed right atrial pressure of 10 mmHg, the estimated right ventricular systolic pressure is 99991111 mmHg. Left Atrium: Left atrial size was severely dilated. Right Atrium: Right atrial size was severely dilated. Pericardium: There is no evidence of pericardial effusion. Mitral Valve: 26 mm Sorin 3-D Memo Ring is in the MV anular position. Mild mean gradient across the repaired valve of 6 mmHg. The mitral valve has been repaired/replaced. No evidence of mitral valve regurgitation. MV peak gradient, 25.5 mmHg. The mean mitral valve gradient is 7.5 mmHg. Tricuspid Valve: The tricuspid valve is normal in structure. Tricuspid valve regurgitation is mild. Aortic Valve: 21 mm Edwards Magna-ease pericardial valve is in the AV position. Average mean gradient is measured at 24 mmHg (within range of  normal values for valve) The anatomy of the AV is poorly visualized. The aortic valve has been repaired/replaced. Aortic valve regurgitation is not visualized. Aortic valve mean gradient measures 24.3 mmHg. Aortic valve peak gradient measures 42.3 mmHg. Aortic valve area, by VTI measures 1.01 cm. Pulmonic Valve: The pulmonic valve was not well visualized. Pulmonic valve regurgitation is not visualized. No evidence of pulmonic stenosis. Aorta: The aortic root is normal in size and structure. IAS/Shunts: No atrial level shunt detected by color flow Doppler.  LEFT VENTRICLE PLAX 2D LVIDd:         3.83 cm LVIDs:         2.36 cm LV PW:         1.05 cm LV IVS:        1.24 cm LVOT diam:     1.80 cm LV SV:         64 LV SV Index:   34 LVOT Area:     2.54 cm  LEFT ATRIUM  Index       RIGHT ATRIUM           Index LA diam:        4.50 cm 2.37 cm/m  RA Area:     30.05 cm LA Vol (A2C):   83.5 ml 43.96 ml/m RA Volume:   113.65 ml 59.83 ml/m LA Vol (A4C):   85.7 ml 45.12 ml/m LA Biplane Vol: 89.7 ml 47.22 ml/m  AORTIC VALVE AV Area (Vmax):    0.94 cm AV Area (Vmean):   0.98 cm AV Area (VTI):     1.01 cm AV Vmax:           325.28 cm/s AV Vmean:          229.761 cm/s AV VTI:            0.630 m AV Peak Grad:      42.3 mmHg AV Mean Grad:      24.3 mmHg LVOT Vmax:         120.50 cm/s LVOT Vmean:        88.450 cm/s LVOT VTI:          0.250 m LVOT/AV VTI ratio: 0.40  AORTA Ao Root diam: 2.90 cm MITRAL VALVE                TRICUSPID VALVE MV Area (PHT): 2.11 cm     TR Peak grad:   38.2 mmHg MV Peak grad:  25.5 mmHg    TR Vmax:        309.00 cm/s MV Mean grad:  7.5 mmHg MV Vmax:       2.53 m/s     SHUNTS MV Vmean:      112.7 cm/s   Systemic VTI:  0.25 m MV Decel Time: 359 msec     Systemic Diam: 1.80 cm MV E velocity: 211.00 cm/s Carlyle Dolly MD Electronically signed by Carlyle Dolly MD Signature Date/Time: 08/02/2019/4:00:55 PM    Final     Cardiac Studies     Patient Profile     Nicole [name] a 84  y.o.femalewith past medical history of permanent atrial fibrillation (failed prior DCCV, developed hyperthyroidism and junctional rhythm with Amiodarone in the past--> rate-control strategy pursued), valvular heart disease (s/p tissue AVR in 2014 with MV ring and maze procedure), chronic diastolic CHF, COPD, hyperthyroidism, HLD and CML with thrombocytosiswho is being seen today for the evaluation of atrial fibrillation with RVRat the request ofDr. Manuella Ghazi.   Assessment & Plan    1. Afib with RVR -had failed prior DCCV and was intolerant toAmiodarone in the past due to hyperthyroidism and junctional rhythm. Has been undergoing rate control strategy - low bp's this admission have affected rate control -has been on dilt 240 and toprol 25mg  bidat home. Due to low bp's during admission on dilt short acting 60mg  every 6 hours, toprol 25mg  just once daily SBPs essentially low 100s.  - remains on eliquis  - continue current regimen, likely consolidate to longer acting dilt once hemodynamics further settle out. Rates 90s to low 100s, reasonable control given management limitations  2. Chronic diastolic HF - diuretics had been held due to AKI and soft bp's this admit. Over that time significant worsening in breathing - CXR showed increased right basilar atelectasis and pleural effusion, had some increased work of breahting 2 days ago. Have tried gentle diuresis - repeat echo shows LVEF 60-65%, indet DDX, low normal RV function, severe BAE, normal AVR, normal MV repair. No significant  change overall  - on lasix IV 30mg  bid. Negative 1.3 L yesterday. Downtrend in Cr with gentle diuresis and bp's tolerated, bp's overall are staying in normal range . Breathing improving.  Redose IV lasix low dose 30mg  bid again today x2 doses, reasssess and reorder diuretic tomorrow pending response.   3. AKI  -baseline Cr around 0.9. Admitted with Cr 1.2, with fluids down to 1.1. On the morning of the 27th got  torsemide 20mg  bid and also lasix 80mg  x 1, Cr up to 1.47 and then 1.66 on March 1st. Soft bp's on admit which seemed to worsen on 27th.  today Cr down to 1.09  4. Hypotension - in setting of pneumonia, diuresis - SBPs low 100s  5. COPD - per pulmonary  6. Pleural effusion - followed by pulmonary  7. Anemia - has received 1 unit pRBCs  8 CML  - followed by heme/onc inpatient  For questions or updates, please contact [place] Please consult [email or web address] for contact info under        Signed, Carlyle Dolly, MD  08/03/2019, 8:50 AM

## 2019-08-03 NOTE — Progress Notes (Signed)
Spoke with pt's daughter over the phone.  Explained that Ms. Bagnato seems to have waxing/waning status.  Daughter concerned about episodes of confusion also.  Explained that there might not be an acute, reversible process.  Rather we might be dealing more with progression of her chronic health issues.    Will monitor response to change in nebulizer regimen, continued diuresis, and longer time on hydroxyurea.  If no improvement over the weekend or if she gets worse, then likely to address goals of care further with family.  Chesley Mires, MD [facility]monary/Critical Care 08/03/2019, 11:58 AM

## 2019-08-03 NOTE — Progress Notes (Signed)
NAME:  Nicole [name], MRN:  [id number], DOB:  1935/10/31, LOS: 7 ADMISSION DATE:  07/27/2019, CONSULTATION DATE:  07/30/2019 REFERRING MD:  Dr. Manuella Ghazi, [facility], CHIEF COMPLAINT:  Short of breath  Brief History   84 yo female former smoker presented with worsening dyspnea and cough.  Had recent course of doxycycline and prednisone as outpt prior to admission.  Admitted with recurrent COPD exacerbation and HCAP.  History of present illness   She was in hospital recently with A fib and CHF.  Has noticed more trouble with her breathing since.  She was started on ABx for possible HCAP.  She has been getting diuretics.  She has been using breo.  She feels this helps, but concerned about expense.  Transitioned to room air at rest, but was told by PT that she would need supplemental oxygen with activity.  Leg swelling has gotten better.  Has cough with beige colored sputum.  Denies sinus congestion, sore throat, dysphagia, hemoptysis, chest pain, nausea.  Gets intermittent discomfort in abdomen from large ventral hernia, but these symptoms usually don't last long and resolve spontaneously.  Past Medical History  COPD, A fib, CML with thrombocytosis, Chronic diastolic CHF, severe AS and moderate MR s/p bioprosthetic AVR/MV ring/MAZE 2014, Amiodarone induced hyperthyroidism  Significant Hospital Events   2/26 Admit 3/03 transfuse 1 unit PRBC  Consults:  Cardiology Hematology/oncology  Procedures:    Significant Diagnostic Tests:  PFT 10/30/13 >> FEV1 1.34 (68%), FEV1% 73, TLC 3.60 (73%), DLCO 50%, +BD Echo 06/25/19 >> EF 55 to 123456, mild RV systolic dysfx, severe LA and RA dilation, s/p MVR, moderately elevated PASP CT angio chest 06/24/19 >> atherosclerosis, clipping of LA, 4.6 x 3.2 Lt thyroid enlargement, tiny pleural effusions, kyphosis, large ventral hernia  Micro Data:  SARS CoV2 PCR 2/26 >> negative Influenza PCR 2/26 >> negative Pneumococcal Ag 2/26 >> negative  Antimicrobials:  Vancomycin  2/26 >> 2/27 Cefepime 2/26 >> 3/02  Interim history/subjective:  Feels more fatigued this morning.  Winded after walking to bathroom.  Also has mild abdominal discomfort.  Objective   Blood pressure (!) 89/49, pulse 65, temperature 98.8 F (37.1 C), temperature source Oral, resp. rate 20, height 5\' 3"  (1.6 m), weight 86.4 kg, SpO2 97 %.        Intake/Output Summary (Last 24 hours) at 08/03/2019 1137 Last data filed at 08/03/2019 0900 Gross per 24 hour  Intake 920 ml  Output 1650 ml  Net -730 ml   Filed Weights   08/01/19 0500 08/02/19 0500 08/03/19 0500  Weight: 87.6 kg 87 kg 86.4 kg    Examination:  General - alert Eyes - pupils reactive ENT - no sinus tenderness, no stridor Cardiac - irregular Chest - no wheeze/rales Abdomen - soft, non tender, + bowel sounds, ventral hernia Extremities - ankle edema Skin - no rashes Neuro - normal strength, moves extremities, follows commands   Discussion:  84 yo female smoker with multifactorial dyspnea: 1) COPD exacerbation with hx of COPD and positive bronchodilator response, 2) possible HCAP, 3) Acute on chronic diastolic CHF with hx of A fib and valvular heart disease, 4) restrictive lung disease from kyphosis, obesity and ventral hernia, 5) anemia, 6) deconditioning.  It seems that most of her issues at present are chronic in nature rather than an acute reversible process.  The only condition that hasn't been addressed yet is CML with thrombocytosis.  She wasn't started on Gleevac yet.  She has been seen by hematology/oncology and started on hydroxyurea.  Still has waxing/waning symptoms w/o significant improvements.  Assessment & Plan:   COPD and positive bronchodilator response. - try changing to pulmicort, brovana nebulizer with prn albuterol in place of breo - will need to look into less expensive option for LABA/ICS as outpt - goal SpO2 > 92%  Rt atelectasis and effusion. - bronchial hygiene, diuresis - f/u CXR if symptoms  progress  Restrictive lung disease. - from kyphosis, large ventral hernia, obesity - weight reduction  Permanent A fib s/p MAZE. Acute on chronic diastolic CHF. Valvular heart disease s/p bioprosthetic AVR and mitral annuloplasty. Hx of HLD. - continue apixaban, atorvastatin, diltiazem, toprol XL - cardiology following - continue diuresis  Goiter. - f/u with PCP as outpt  Hx of CML with thrombocytosis. Anemia of chronic disease. - in chronic phase - Dr. Delton Coombes consulted form hematology/oncology - declined bone marrow bx at outpt visit on 07/24/19 - plan was to start Gleevac as outpt >> hasn't been started yet - started on hydroxyurea since Gleevac can't be started in hospital - f/u CBC  Deconditioning. - PT/OT - recommending SNF  Goals of care. - DNR/DNI  Resolved problems:  HCAP, AKI from diuresis, COPD exacerbation  Best practice:  Diet: heart healthy DVT prophylaxis: apixiban GI prophylaxis: not indicated Mobility: OOB Code Status: DNR Disposition: telemetry  Labs    CMP Latest Ref Rng & Units 08/03/2019 08/02/2019 08/01/2019  Glucose 70 - 99 mg/dL 126(H) 99 115(H)  BUN 8 - 23 mg/dL 25(H) 33(H) 38(H)  Creatinine 0.44 - 1.00 mg/dL 1.09(H) 1.22(H) 1.19(H)  Sodium 135 - 145 mmol/L 139 140 137  Potassium 3.5 - 5.1 mmol/L 3.7 3.9 4.3  Chloride 98 - 111 mmol/L 105 107 106  CO2 22 - 32 mmol/L 28 25 23   Calcium 8.9 - 10.3 mg/dL 8.7(L) 8.8(L) 8.7(L)  Total Protein 6.5 - 8.1 g/dL - - -  Total Bilirubin 0.3 - 1.2 mg/dL - - -  Alkaline Phos 38 - 126 U/L - - -  AST 15 - 41 U/L - - -  ALT 0 - 44 U/L - - -    CBC Latest Ref Rng & Units 08/03/2019 08/02/2019 08/01/2019  WBC 4.0 - 10.5 K/uL 15.3(H) 13.1(H) -  Hemoglobin 12.0 - 15.0 g/dL 9.3(L) 8.7(L) 8.9(L)  Hematocrit 36.0 - 46.0 % 32.1(L) 29.7(L) 29.7(L)  Platelets 150 - 400 K/uL 2,201(HH) 1,869(HH) -    Chesley Mires, MD [place] 08/03/2019, 11:37 AM

## 2019-08-03 NOTE — Care Management Important Message (Signed)
Important Message  Patient Details  Name: Nicole [name] MRN: [id number] Date of Birth: 1936-03-11   Medicare Important Message Given:  Yes     [name] 08/03/2019, 3:36 PM

## 2019-08-03 NOTE — Telephone Encounter (Signed)
[employer] has open funding for CML.  I spoke with patients daughter [name] and she is going to get financial income and email me so I can start the online application.  I sent her an email to let her know what we may need while applying.  I did tell her that they are slow to approve patients and that it can take 6-8 weeks on average to receive a decision.  She understands this and would like to proceed.  Diane, nurse at [facility], is looking to see if their [place] can pay part of the patients copay to get her started while awaiting grant assistance through [employer].  [place] Patient [place] Phone [phone] Fax [phone] 08/03/2019 4:15 PM;m

## 2019-08-03 NOTE — Plan of Care (Signed)
  Problem: Health Behavior/Discharge Planning: Goal: Ability to manage health-related needs will improve Outcome: Progressing   Problem: Safety: Goal: Ability to remain free from injury will improve Outcome: Progressing   Problem: Education: Goal: Knowledge of General Education information will improve Description: Including pain rating scale, medication(s)/side effects and non-pharmacologic comfort measures Outcome: Progressing   Problem: Nutrition: Goal: Adequate nutrition will be maintained Outcome: Progressing

## 2019-08-03 NOTE — Progress Notes (Signed)
PROGRESS NOTE   Nihal Galloza  [id number] DOB: 05/26/1936 DOA: 07/27/2019 PCP: Frances Maywood, FNP   Brief Narrative:  Per HPI: [name] a 84 y.o.femalewith a history of COPD, diastolic heart failure, atrial fibrillation on anticoagulation, CML with thrombocytosis. She was recently admitted about a month ago for similar complaints. She presented with Afib and RVR. She did complete doxycycline for total of 5 days for bronchitis/bronchiectasis.  On day of admission she presented with 1 week of worsening shortness of breath which has been worsening and is now severe. Symptoms are worse with ambulation and improved with rest. No other palliating or provoking factors.  She had been prescribed doxycycline and prednisone by her oncology physician, although she has not been get these medications.  Assessment & Plan:   Active Problems:   CAD (coronary artery disease)   Atrial fibrillation (HCC)   Chronic diastolic CHF (congestive heart failure) (HCC)   Aortic stenosis   Thrombocytosis (HCC)   COPD exacerbation (HCC)   CML (chronic myelocytic leukemia) ([place])   HCAP (healthcare-associated pneumonia)   Acute respiratory failure with hypoxia (HCC)   DNR (do not resuscitate)   Pleural effusion   Sepsis ([place])   Acute hypoxemic respiratory failure likely secondary to HCAP with some mild pulmonary vascular congestion with acute on chronic diastolic heart failure -completed cefepime.  -bronchodilators as needed.  -Patient is normally on room air at home, wean oxygen as able -Appreciate PCCM ongoing evaluation, persistent CHF on today's chest xray -Continue IS -second dose of IV lasix held due to soft BP   Atrial fibrillation with RVR -Currently rate controlled and diltiazem is being titrated by cardiology service -Failed prior DCCV and trial of amiodarone -Appreciate cardiology evaluation with recommendations to continue short acting Cardizem and metoprolol XL. -Continue  apixaban for full anticoagulation - Rate control meds were held due to soft BPs and has been challenging.    AKI on CKD stage 3 -monitoring while being diuresed with IV lasix -Creatinine improved to 1.19 from 1.6  -Monitoring urine output  COPD with mild exacerbation related to above-improving -Steroids held on 2/28 and albuterol scheduled switch to Xopenex as needed  CML with thrombocytosis -Followed by Dr. Delton Coombes outpatient -Hemoglobin 7.6 and will consider transfusion for hemoglobin less than 7 -Discussed with Dr. Halford Chessman, he asked for a hematology consultation given acute decline in condition, patient had not yet started Gleevec.   -Consulted Dr. Delton Coombes, he has started patient on hydrea until she can obtain the [place].  Platelets bumped today.  Pt now on 1000 mg of hydroxyurea today.   Goiter with signs of hyperthyroidism -Previously on amiodarone and this is the likely culprit -T4 level slightly elevated with decreasing TSH -We will need endocrinology follow-up outpatient  Anemia in neoplastic disease - pt is s/p 1 unit PRBC on 08/01/19 and Hg improved to 8.7.    DVT prophylaxis:Apixaban Code Status:DNR Family Communication:Discussed with daughter telephone 08/02/19 Disposition Plan:from home, but too weak and deconditioned to return home, agreeable to SNF, Pt requiring ongoing IV lasix, hematology consulted regarding CML with thrombocytosis and started on hydrea, cardiology working on diltiazem dose titrations which has been challenging given patient's soft blood pressures, not medically ready to discharge.  Consultants:  PCCM  Cardiology  Procedures:  See below  Antimicrobials:  Anti-infectives (From admission, onward)   Start     Dose/Rate Route Frequency Ordered Stop   07/31/19 1800  ceFEPIme (MAXIPIME) 2 g in sodium chloride 0.9 % 100 mL IVPB  2 g 200 mL/hr over 30 Minutes Intravenous  Once 07/31/19 0837 07/31/19 2047   07/31/19 0600   ceFEPIme (MAXIPIME) 2 g in sodium chloride 0.9 % 100 mL IVPB     2 g 200 mL/hr over 30 Minutes Intravenous Every 24 hours 07/30/19 1144 07/31/19 2048   07/28/19 1800  vancomycin (VANCOREADY) IVPB 500 mg/100 mL  Status:  Discontinued     500 mg 100 mL/hr over 60 Minutes Intravenous Every 24 hours 07/27/19 1744 07/29/19 0945   07/27/19 1800  ceFEPIme (MAXIPIME) 2 g in sodium chloride 0.9 % 100 mL IVPB  Status:  Discontinued     2 g 200 mL/hr over 30 Minutes Intravenous Every 12 hours 07/27/19 1714 07/30/19 1144   07/27/19 1800  vancomycin (VANCOREADY) IVPB 1500 mg/300 mL     1,500 mg 150 mL/hr over 120 Minutes Intravenous  Once 07/27/19 1714 07/27/19 2012   07/27/19 1700  vancomycin (VANCOCIN) IVPB 1000 mg/200 mL premix  Status:  Discontinued     1,000 mg 200 mL/hr over 60 Minutes Intravenous  Once 07/27/19 1658 07/27/19 1714   07/27/19 1700  ceFEPIme (MAXIPIME) 2 g in sodium chloride 0.9 % 100 mL IVPB  Status:  Discontinued     2 g 200 mL/hr over 30 Minutes Intravenous  Once 07/27/19 1658 07/27/19 1714      Subjective: Patient says she feels fairly well today, she is wanting to eat her breakfast and has a good appetite today.      Objective: Vitals:   08/03/19 1215 08/03/19 1317 08/03/19 1720 08/03/19 1734  BP: 108/67  (!) 82/49 106/82  Pulse: 89  (!) 111 (!) 129  Resp:    20  Temp:   97.8 F (36.6 C) 97.8 F (36.6 C)  TempSrc:   Oral   SpO2: 96% 96% 95% 93%  Weight:      Height:        Intake/Output Summary (Last 24 hours) at 08/03/2019 1748 Last data filed at 08/03/2019 1300 Gross per 24 hour  Intake 680 ml  Output 2150 ml  Net -1470 ml   Filed Weights   08/01/19 0500 08/02/19 0500 08/03/19 0500  Weight: 87.6 kg 87 kg 86.4 kg    Examination:  General exam: very frail and weak, but alert and oriented today.   Respiratory system: good air movement, rare crackles at bases heard.  Cardiovascular system: S1 & S2 heard, irregularly irregular. Trace pedal edema.    Gastrointestinal system: Abdomen is nondistended, soft and nontender. No organomegaly or masses felt. Normal bowel sounds heard. Central nervous system: Alert and oriented. No focal neurological deficits. Extremities: trace edema BLEs. Skin: No rashes, lesions or ulcers Psychiatry: Judgement and insight appear normal. Mood & affect appropriate.   Data Reviewed: I have personally reviewed following labs and imaging studies  CBC: Recent Labs  Lab 07/30/19 0454 07/30/19 0454 07/31/19 0502 08/01/19 0437 08/01/19 2247 08/02/19 0410 08/03/19 0829  WBC 23.0*  --  15.6* 16.7*  --  13.1* 15.3*  HGB 7.7*   < > 7.6* 7.7* 8.9* 8.7* 9.3*  HCT 26.8*   < > 26.1* 27.2* 29.7* 29.7* 32.1*  MCV 88.7  --  88.8 90.1  --  90.3 89.9  PLT 2,077*  --  1,851* 1,903*  --  1,869* 2,201*   < > = values in this interval not displayed.   Basic Metabolic Panel: Recent Labs  Lab 07/30/19 0454 07/31/19 0502 08/01/19 0437 08/02/19 0410 08/03/19 0829  NA 135 137  137 140 139  K 4.5 4.6 4.3 3.9 3.7  CL 101 106 106 107 105  CO2 25 24 23 25 28   GLUCOSE 130* 113* 115* 99 126*  BUN 54* 48* 38* 33* 25*  CREATININE 1.66* 1.41* 1.19* 1.22* 1.09*  CALCIUM 9.0 8.8* 8.7* 8.8* 8.7*  MG  --   --   --  2.7*  --    GFR: Estimated Creatinine Clearance: 41.5 mL/min (A) (by C-G formula based on SCr of 1.09 mg/dL (H)). Liver Function Tests: No results for input(s): AST, ALT, ALKPHOS, BILITOT, PROT, ALBUMIN in the last 168 hours. No results for input(s): LIPASE, AMYLASE in the last 168 hours. No results for input(s): AMMONIA in the last 168 hours. Coagulation Profile: No results for input(s): INR, PROTIME in the last 168 hours. Cardiac Enzymes: No results for input(s): CKTOTAL, CKMB, CKMBINDEX, TROPONINI in the last 168 hours. BNP (last 3 results) No results for input(s): PROBNP in the last 8760 hours. HbA1C: No results for input(s): HGBA1C in the last 72 hours. CBG: No results for input(s): GLUCAP in the last  168 hours. Lipid Profile: No results for input(s): CHOL, HDL, LDLCALC, TRIG, CHOLHDL, LDLDIRECT in the last 72 hours. Thyroid Function Tests: No results for input(s): TSH, T4TOTAL, FREET4, T3FREE, THYROIDAB in the last 72 hours. Anemia Panel: No results for input(s): VITAMINB12, FOLATE, FERRITIN, TIBC, IRON, RETICCTPCT in the last 72 hours. Sepsis Labs: Recent Labs  Lab 07/27/19 1925 07/28/19 0710 07/29/19 0629  PROCALCITON  --  <0.10 <0.10  LATICACIDVEN 1.3  --   --     Recent Results (from the past 240 hour(s))  Respiratory Panel by RT PCR (Flu A&B, Covid) - Nasopharyngeal Swab     Status: None   Collection Time: 07/27/19  3:45 PM   Specimen: Nasopharyngeal Swab  Result Value Ref Range Status   SARS Coronavirus 2 by RT PCR NEGATIVE NEGATIVE Final    Comment: (NOTE) SARS-CoV-2 target nucleic acids are NOT DETECTED. The SARS-CoV-2 RNA is generally detectable in upper respiratoy specimens during the acute phase of infection. The lowest concentration of SARS-CoV-2 viral copies this assay can detect is 131 copies/mL. A negative result does not preclude SARS-Cov-2 infection and should not be used as the sole basis for treatment or other patient management decisions. A negative result may occur with  improper specimen collection/handling, submission of specimen other than nasopharyngeal swab, presence of viral mutation(s) within the areas targeted by this assay, and inadequate number of viral copies (<131 copies/mL). A negative result must be combined with clinical observations, patient history, and epidemiological information. The expected result is Negative. Fact Sheet for Patients:  PinkCheek.be Fact Sheet for Healthcare Providers:  GravelBags.it This test is not yet ap proved or cleared by the [place] FDA and  has been authorized for detection and/or diagnosis of SARS-CoV-2 by FDA under an Emergency Use  Authorization (EUA). This EUA will remain  in effect (meaning this test can be used) for the duration of the COVID-19 declaration under Section 564(b)(1) of the Act, 21 U.S.C. section 360bbb-3(b)(1), unless the authorization is terminated or revoked sooner.    Influenza A by PCR NEGATIVE NEGATIVE Final   Influenza B by PCR NEGATIVE NEGATIVE Final    Comment: (NOTE) The Xpert Xpress SARS-CoV-2/FLU/RSV assay is intended as an aid in  the diagnosis of influenza from Nasopharyngeal swab specimens and  should not be used as a sole basis for treatment. Nasal washings and  aspirates are unacceptable for Xpert Xpress SARS-CoV-2/FLU/RSV  testing. Fact Sheet for Patients: PinkCheek.be Fact Sheet for Healthcare Providers: GravelBags.it This test is not yet approved or cleared by the [place] FDA and  has been authorized for detection and/or diagnosis of SARS-CoV-2 by  FDA under an Emergency Use Authorization (EUA). This EUA will remain  in effect (meaning this test can be used) for the duration of the  Covid-19 declaration under Section 564(b)(1) of the Act, 21  U.S.C. section 360bbb-3(b)(1), unless the authorization is  terminated or revoked. Performed at [facility], [address], [place], [place] [address]   Blood Culture (routine x 2)     Status: None   Collection Time: 07/27/19  5:20 PM   Specimen: BLOOD RIGHT HAND  Result Value Ref Range Status   Specimen Description BLOOD RIGHT HAND  Final   Special Requests   Final    BOTTLES DRAWN AEROBIC AND ANAEROBIC Blood Culture adequate volume   Culture   Final    NO GROWTH 5 DAYS Performed at [facility], [address] [place], [place] [address]    Report Status 08/01/2019 FINAL  Final  Blood Culture (routine x 2)     Status: None   Collection Time: 07/27/19  5:20 PM   Specimen: Left Antecubital; Blood  Result Value Ref Range Status   Specimen Description LEFT ANTECUBITAL   Final   Special Requests   Final    BOTTLES DRAWN AEROBIC AND ANAEROBIC Blood Culture adequate volume   Culture   Final    NO GROWTH 5 DAYS Performed at [facility], [address], [place], [place] [address]    Report Status 08/01/2019 FINAL  Final  Urine culture     Status: Abnormal   Collection Time: 07/27/19  6:35 PM   Specimen: In/Out Cath Urine  Result Value Ref Range Status   Specimen Description   Final    IN/OUT CATH URINE Performed at [facility], [address], [place],  [address]    Special Requests   Final    NONE Performed at [facility], [address] [place],  [address]    Culture 50,000 COLONIES/mL ENTEROCOCCUS FAECALIS (A)  Final   Report Status 07/30/2019 FINAL  Final   Organism ID, Bacteria ENTEROCOCCUS FAECALIS (A)  Final      Susceptibility   Enterococcus faecalis - MIC*    AMPICILLIN <=2 SENSITIVE Sensitive     NITROFURANTOIN <=16 SENSITIVE Sensitive     VANCOMYCIN 2 SENSITIVE Sensitive     * 50,000 COLONIES/mL ENTEROCOCCUS FAECALIS  MRSA PCR Screening     Status: None   Collection Time: 07/29/19  6:03 AM   Specimen: Nasal Mucosa; Nasopharyngeal  Result Value Ref Range Status   MRSA by PCR NEGATIVE NEGATIVE Final    Comment:        The GeneXpert MRSA Assay (FDA approved for NASAL specimens only), is one component of a comprehensive MRSA colonization surveillance program. It is not intended to diagnose MRSA infection nor to guide or monitor treatment for MRSA infections. Performed at [facility], [address] [place],  [address]   SARS CORONAVIRUS 2 (TAT 6-24 HRS) Nasopharyngeal Nasopharyngeal Swab     Status: None   Collection Time: 08/01/19  4:45 AM   Specimen: Nasopharyngeal Swab  Result Value Ref Range Status   SARS Coronavirus 2 NEGATIVE NEGATIVE Final    Comment: (NOTE) SARS-CoV-2 target nucleic acids are NOT DETECTED. The SARS-CoV-2 RNA is generally detectable in upper and lower respiratory specimens during  the  acute phase of infection. Negative results do not preclude SARS-CoV-2 infection, do not rule out co-infections with other pathogens, and should not be used as the sole basis for treatment or other patient management decisions. Negative results must be combined with clinical observations, patient history, and epidemiological information. The expected result is Negative. Fact Sheet for Patients: SugarRoll.be Fact Sheet for Healthcare Providers: https://www.woods-mathews.com/ This test is not yet approved or cleared by the [place] FDA and  has been authorized for detection and/or diagnosis of SARS-CoV-2 by FDA under an Emergency Use Authorization (EUA). This EUA will remain  in effect (meaning this test can be used) for the duration of the COVID-19 declaration under Section 56 4(b)(1) of the Act, 21 U.S.C. section 360bbb-3(b)(1), unless the authorization is terminated or revoked sooner. Performed at [facility], [place] [address], [place], [place] [address]   SARS CORONAVIRUS 2 (TAT 6-24 HRS) Nasopharyngeal Nasopharyngeal Swab     Status: None   Collection Time: 08/02/19  2:56 PM   Specimen: Nasopharyngeal Swab  Result Value Ref Range Status   SARS Coronavirus 2 NEGATIVE NEGATIVE Final    Comment: (NOTE) SARS-CoV-2 target nucleic acids are NOT DETECTED. The SARS-CoV-2 RNA is generally detectable in upper and lower respiratory specimens during the acute phase of infection. Negative results do not preclude SARS-CoV-2 infection, do not rule out co-infections with other pathogens, and should not be used as the sole basis for treatment or other patient management decisions. Negative results must be combined with clinical observations, patient history, and epidemiological information. The expected result is Negative. Fact Sheet for Patients: SugarRoll.be Fact Sheet for Healthcare  Providers: https://www.woods-mathews.com/ This test is not yet approved or cleared by the [place] FDA and  has been authorized for detection and/or diagnosis of SARS-CoV-2 by FDA under an Emergency Use Authorization (EUA). This EUA will remain  in effect (meaning this test can be used) for the duration of the COVID-19 declaration under Section 56 4(b)(1) of the Act, 21 U.S.C. section 360bbb-3(b)(1), unless the authorization is terminated or revoked sooner. Performed at [facility], [place] [address], [place], [place] [address]    Radiology Studies: DG CHEST PORT 1 VIEW  Result Date: 08/02/2019 CLINICAL DATA:  CHF EXAM: PORTABLE CHEST 1 VIEW COMPARISON:  August 01, 2019 FINDINGS: The heart size and mediastinal contours are unchanged with cardiomegaly. Aortic knob calcifications. Again noted is prominence of the central pulmonary vasculature. Small bilateral pleural effusions are again noted, right greater than left. IMPRESSION: No significant interval change in pulmonary vascular congestion and small bilateral pleural effusions. Electronically Signed   By: Prudencio Pair M.D.   On: 08/02/2019 04:51   ECHOCARDIOGRAM COMPLETE  Result Date: 08/02/2019    ECHOCARDIOGRAM REPORT   Patient Name:   Ausha Beth Date of Exam: 08/02/2019 Medical Rec #:  [id number]   Height:       63.0 in Accession #:    [id number]  Weight:       191.8 lb Date of Birth:  1935-07-12   BSA:          1.900 m Patient Age:    32 years    BP:           110/84 mmHg Patient Gender: F           HR:           95 bpm. Exam Location:  [facility] Procedure: 2D Echo, Cardiac Doppler and Color Doppler Indications:    Dyspnea  786.09 / R06.00  History:        Patient has prior history of Echocardiogram examinations, most                 recent 06/25/2019. CHF, CAD, COPD, Aortic Valve Disease and                 Mitral Valve Disease, Arrythmias:Atrial Fibrillation; Risk                 Factors:Dyslipidemia. Acute respiratory failure  with hypoxia,                 s/p tissue AVR in 2014 with MV ring and maze procedure.  Sonographer:    Alvino Chapel RCS Referring Phys: [id number] [place]  1. Left ventricular ejection fraction, by estimation, is 60 to 65%. The left ventricle has normal function. The left ventricle has no regional wall motion abnormalities. There is mild There is basal septal hypertrophy left ventricular hypertrophy. Left ventricular diastolic parameters are indeterminate.  2. Right ventricular systolic function is low normal. The right ventricular size is mildly enlarged. There is moderately elevated pulmonary artery systolic pressure.  3. Left atrial size was severely dilated.  4. Right atrial size was severely dilated.  5. 26 mm Sorin 3-D Memo Ring is in the MV anular position. Mild mean gradient across the repaired valve of 6 mmHg. . The mitral valve has been repaired/replaced. No evidence of mitral valve regurgitation.  6. 21 mm Edwards Magna-ease pericardial valve is in the AV position. Average mean gradient is measured at 24 mmHg (within range of normal values for valve) The anatomy of the AV is poorly visualized. . The aortic valve has been repaired/replaced. Aortic  valve regurgitation is not visualized. FINDINGS  Left Ventricle: Left ventricular ejection fraction, by estimation, is 60 to 65%. The left ventricle has normal function. The left ventricle has no regional wall motion abnormalities. The left ventricular internal cavity size was normal in size. There is  mild There is basal septal hypertrophy left ventricular hypertrophy. Left ventricular diastolic parameters are indeterminate. Right Ventricle: The right ventricular size is mildly enlarged. Right vetricular wall thickness was not assessed. Right ventricular systolic function is low normal. There is moderately elevated pulmonary artery systolic pressure. The tricuspid regurgitant velocity is 3.09 m/s, and with an assumed right atrial pressure of  10 mmHg, the estimated right ventricular systolic pressure is 99991111 mmHg. Left Atrium: Left atrial size was severely dilated. Right Atrium: Right atrial size was severely dilated. Pericardium: There is no evidence of pericardial effusion. Mitral Valve: 26 mm Sorin 3-D Memo Ring is in the MV anular position. Mild mean gradient across the repaired valve of 6 mmHg. The mitral valve has been repaired/replaced. No evidence of mitral valve regurgitation. MV peak gradient, 25.5 mmHg. The mean mitral valve gradient is 7.5 mmHg. Tricuspid Valve: The tricuspid valve is normal in structure. Tricuspid valve regurgitation is mild. Aortic Valve: 21 mm Edwards Magna-ease pericardial valve is in the AV position. Average mean gradient is measured at 24 mmHg (within range of normal values for valve) The anatomy of the AV is poorly visualized. The aortic valve has been repaired/replaced. Aortic valve regurgitation is not visualized. Aortic valve mean gradient measures 24.3 mmHg. Aortic valve peak gradient measures 42.3 mmHg. Aortic valve area, by VTI measures 1.01 cm. Pulmonic Valve: The pulmonic valve was not well visualized. Pulmonic valve regurgitation is not visualized. No evidence of pulmonic stenosis. Aorta: The aortic root is  normal in size and structure. IAS/Shunts: No atrial level shunt detected by color flow Doppler.  LEFT VENTRICLE PLAX 2D LVIDd:         3.83 cm LVIDs:         2.36 cm LV PW:         1.05 cm LV IVS:        1.24 cm LVOT diam:     1.80 cm LV SV:         64 LV SV Index:   34 LVOT Area:     2.54 cm  LEFT ATRIUM             Index       RIGHT ATRIUM           Index LA diam:        4.50 cm 2.37 cm/m  RA Area:     30.05 cm LA Vol (A2C):   83.5 ml 43.96 ml/m RA Volume:   113.65 ml 59.83 ml/m LA Vol (A4C):   85.7 ml 45.12 ml/m LA Biplane Vol: 89.7 ml 47.22 ml/m  AORTIC VALVE AV Area (Vmax):    0.94 cm AV Area (Vmean):   0.98 cm AV Area (VTI):     1.01 cm AV Vmax:           325.28 cm/s AV Vmean:           229.761 cm/s AV VTI:            0.630 m AV Peak Grad:      42.3 mmHg AV Mean Grad:      24.3 mmHg LVOT Vmax:         120.50 cm/s LVOT Vmean:        88.450 cm/s LVOT VTI:          0.250 m LVOT/AV VTI ratio: 0.40  AORTA Ao Root diam: 2.90 cm MITRAL VALVE                TRICUSPID VALVE MV Area (PHT): 2.11 cm     TR Peak grad:   38.2 mmHg MV Peak grad:  25.5 mmHg    TR Vmax:        309.00 cm/s MV Mean grad:  7.5 mmHg MV Vmax:       2.53 m/s     SHUNTS MV Vmean:      112.7 cm/s   Systemic VTI:  0.25 m MV Decel Time: 359 msec     Systemic Diam: 1.80 cm MV E velocity: 211.00 cm/s Carlyle Dolly MD Electronically signed by Carlyle Dolly MD Signature Date/Time: 08/02/2019/4:00:55 PM    Final    Scheduled Meds: . apixaban  5 mg Oral BID  . arformoterol  15 mcg Nebulization BID  . atorvastatin  40 mg Oral q1800  . budesonide (PULMICORT) nebulizer solution  0.5 mg Nebulization BID  . busPIRone  10 mg Oral BID  . diltiazem  60 mg Oral Q6H  . docusate sodium  200 mg Oral QHS  . DULoxetine  20 mg Oral Daily  . feeding supplement (ENSURE ENLIVE)  237 mL Oral Q24H  . ferrous sulfate  325 mg Oral Q breakfast  . hydroxyurea  1,000 mg Oral Daily  . mouth rinse  15 mL Mouth Rinse BID  . metoprolol succinate  25 mg Oral Daily  . multivitamin with minerals  1 tablet Oral Daily  . polyethylene glycol  17 g Oral BID   Continuous Infusions:    LOS: 7  days   Time spent: 30 minutes  Irwin Brakeman, MD [facility] How to contact the [facility] Attending or Consulting provider [place] or covering provider during after hours [place], for this patient?  1. Check the care team in [facility] and look for a) attending/consulting [facility] provider listed and b) the [facility] team listed 2. Log into [email or web address] and use [place]'s universal password to access. If you do not have the password, please contact the hospital operator. 3. Locate the [facility] provider you are looking for under [facility] and page to a number that you  can be directly reached. 4. If you still have difficulty reaching the provider, please page the [facility] (Director on Call) for the Hospitalists listed on amion for assistance.   If 7PM-7AM, please contact night-coverage [email or web address] Password [facility] 08/03/2019, 5:48 PM

## 2019-08-04 ENCOUNTER — Inpatient Hospital Stay (HOSPITAL_COMMUNITY): Payer: Medicare Other

## 2019-08-04 LAB — BASIC METABOLIC PANEL
Anion gap: 8 (ref 5–15)
BUN: 18 mg/dL (ref 8–23)
CO2: 31 mmol/L (ref 22–32)
Calcium: 8.9 mg/dL (ref 8.9–10.3)
Chloride: 103 mmol/L (ref 98–111)
Creatinine, Ser: 0.94 mg/dL (ref 0.44–1.00)
GFR calc Af Amer: >60 mL/min (ref >60)
GFR calc non Af Amer: 56 mL/min — ABNORMAL LOW (ref >60)
Glucose, Bld: 116 mg/dL — ABNORMAL HIGH (ref 70–99)
Potassium: 3.4 mmol/L — ABNORMAL LOW (ref 3.5–5.1)
Sodium: 142 mmol/L (ref 135–145)

## 2019-08-04 LAB — CBC
HCT: 30.9 % — ABNORMAL LOW (ref 36.0–46.0)
Hemoglobin: 8.8 g/dL — ABNORMAL LOW (ref 12.0–15.0)
MCH: 26 pg (ref 26.0–34.0)
MCHC: 28.5 g/dL — ABNORMAL LOW (ref 30.0–36.0)
MCV: 91.4 fL (ref 80.0–100.0)
Platelets: 2069 10*3/uL (ref 150–400)
RBC: 3.38 MIL/uL — ABNORMAL LOW (ref 3.87–5.11)
RDW: 17.2 % — ABNORMAL HIGH (ref 11.5–15.5)
WBC: 14.1 10*3/uL — ABNORMAL HIGH (ref 4.0–10.5)
nRBC: 0 % (ref 0.0–0.2)

## 2019-08-04 LAB — CHLORIDE, URINE, RANDOM: Chloride Urine: 56 mmol/L

## 2019-08-04 LAB — SODIUM, URINE, RANDOM: Sodium, Ur: 44 mmol/L

## 2019-08-04 LAB — CREATININE, URINE, RANDOM: Creatinine, Urine: 77.66 mg/dL

## 2019-08-04 LAB — MAGNESIUM: Magnesium: 2.3 mg/dL (ref 1.7–2.4)

## 2019-08-04 MED ORDER — FUROSEMIDE 10 MG/ML IJ SOLN
30.0000 mg | Freq: Once | INTRAMUSCULAR | Status: AC
  Administered 2019-08-04: 30 mg via INTRAVENOUS
  Filled 2019-08-04: qty 4

## 2019-08-04 MED ORDER — POTASSIUM CHLORIDE CRYS ER 20 MEQ PO TBCR
20.0000 meq | EXTENDED_RELEASE_TABLET | Freq: Once | ORAL | Status: AC
  Administered 2019-08-04: 20 meq via ORAL
  Filled 2019-08-04: qty 1

## 2019-08-04 NOTE — Progress Notes (Signed)
PROGRESS NOTE   Nicole [name]  [id number] DOB: 1935-09-13 DOA: 07/27/2019 PCP: Frances Maywood, FNP   Brief Narrative:  Per HPI: Nicole [name] a 84 y.o.femalewith a history of COPD, diastolic heart failure, atrial fibrillation on anticoagulation, CML with thrombocytosis. She was recently admitted about a month ago for similar complaints. She presented with Afib and RVR. She did complete doxycycline for total of 5 days for bronchitis/bronchiectasis.  On day of admission she presented with 1 week of worsening shortness of breath which has been worsening and is now severe. Symptoms are worse with ambulation and improved with rest. No other palliating or provoking factors.  She had been prescribed doxycycline and prednisone by her oncology physician, although she has not been get these medications.  Assessment & Plan:   Active Problems:   CAD (coronary artery disease)   Atrial fibrillation (HCC)   Chronic diastolic CHF (congestive heart failure) (HCC)   Aortic stenosis   Thrombocytosis (HCC)   COPD exacerbation (HCC)   CML (chronic myelocytic leukemia) ([place])   HCAP (healthcare-associated pneumonia)   Acute respiratory failure with hypoxia (HCC)   DNR (do not resuscitate)   Pleural effusion   Sepsis ([place])  Acute hypoxemic respiratory failure likely secondary to HCAP with some mild pulmonary vascular congestion with acute on chronic diastolic heart failure -completed cefepime.  -bronchodilators as needed.  -Patient is normally on room air at home, wean oxygen as able -Appreciate PCCM ongoing evaluation.  They that her COPD has been progressing.   -Continue IS -IV lasix 30 mg x 1 dose given 08/04/19.     Atrial fibrillation with RVR -Currently rate controlled and diltiazem is being titrated by cardiology service -Failed prior DCCV and trial of amiodarone -Appreciate cardiology evaluation with recommendations to continue short acting Cardizem and metoprolol XL. -Continue  apixaban for full anticoagulation - Rate control meds have been challenging due to soft BPs.    AKI on CKD stage 3 -monitoring while being diuresed with IV lasix -Creatinine improved to 1.19 from 1.6  -Monitoring urine output  COPD with mild exacerbation related to above-improving -Steroids held on 2/28 and albuterol scheduled switch to Xopenex as needed  CML with thrombocytosis -Followed by Dr. Delton Coombes outpatient -Hemoglobin 7.6 and will consider transfusion for hemoglobin less than 7 -Discussed with Dr. Halford Chessman, he asked for a hematology consultation given acute decline in condition, patient had not yet started Gleevec.   -Consulted Dr. Delton Coombes, he has started patient on hydrea until she can obtain the [place].  Platelets remain high.  Pt now on 1000 mg of hydroxyurea  Goiter with signs of hyperthyroidism -Previously on amiodarone and this is the likely culprit -T4 level slightly elevated with decreasing TSH -We will need endocrinology follow-up outpatient  Anemia in neoplastic disease - pt is s/p 1 unit PRBC on 08/01/19 and Hg improved to 8.7.  Pt is feeling a lot better after the transfusion and seems to be stronger.    DVT prophylaxis:Apixaban Code Status:DNR Family Communication:Discussed with daughter at bedside 08/04/19 Disposition Plan:from home, but too weak and deconditioned to return home, agreeable to SNF, Pt requiring ongoing IV lasix, hematology consulted regarding CML with thrombocytosis and started on hydrea, cardiology working on diltiazem dose titrations which has been challenging given patient's soft blood pressures, not medically ready to discharge.  Consultants:  PCCM  Cardiology  Procedures:  See below  Antimicrobials:  Anti-infectives (From admission, onward)   Start     Dose/Rate Route Frequency Ordered Stop   07/31/19 1800  ceFEPIme (MAXIPIME) 2 g in sodium chloride 0.9 % 100 mL IVPB     2 g 200 mL/hr over 30 Minutes Intravenous   Once 07/31/19 0837 07/31/19 2047   07/31/19 0600  ceFEPIme (MAXIPIME) 2 g in sodium chloride 0.9 % 100 mL IVPB     2 g 200 mL/hr over 30 Minutes Intravenous Every 24 hours 07/30/19 1144 07/31/19 2048   07/28/19 1800  vancomycin (VANCOREADY) IVPB 500 mg/100 mL  Status:  Discontinued     500 mg 100 mL/hr over 60 Minutes Intravenous Every 24 hours 07/27/19 1744 07/29/19 0945   07/27/19 1800  ceFEPIme (MAXIPIME) 2 g in sodium chloride 0.9 % 100 mL IVPB  Status:  Discontinued     2 g 200 mL/hr over 30 Minutes Intravenous Every 12 hours 07/27/19 1714 07/30/19 1144   07/27/19 1800  vancomycin (VANCOREADY) IVPB 1500 mg/300 mL     1,500 mg 150 mL/hr over 120 Minutes Intravenous  Once 07/27/19 1714 07/27/19 2012   07/27/19 1700  vancomycin (VANCOCIN) IVPB 1000 mg/200 mL premix  Status:  Discontinued     1,000 mg 200 mL/hr over 60 Minutes Intravenous  Once 07/27/19 1658 07/27/19 1714   07/27/19 1700  ceFEPIme (MAXIPIME) 2 g in sodium chloride 0.9 % 100 mL IVPB  Status:  Discontinued     2 g 200 mL/hr over 30 Minutes Intravenous  Once 07/27/19 1658 07/27/19 1714      Subjective: Patient was having a difficult time breathing this morning, but feeling better in the afternoon on reassessment      Objective: Vitals:   08/04/19 0952 08/04/19 1000 08/04/19 1300 08/04/19 1351  BP:  (!) 104/49 (!) 99/56   Pulse:   (!) 109 87  Resp:   18   Temp:   97.9 F (36.6 C)   TempSrc:   Oral   SpO2: 96%  98%   Weight:      Height:        Intake/Output Summary (Last 24 hours) at 08/04/2019 1613 Last data filed at 08/04/2019 1409 Gross per 24 hour  Intake 490 ml  Output 1350 ml  Net -860 ml   Filed Weights   08/02/19 0500 08/03/19 0500 08/04/19 0644  Weight: 87 kg 86.4 kg 85.2 kg    Examination:  General exam: very frail and weak, sitting up on side of bed, alert and oriented today.   Respiratory system: good air movement, crackles at bases heard.  Cardiovascular system: S1 & S2 heard, irregularly  irregular. Trace pedal edema.  Gastrointestinal system: Abdomen is nondistended, soft and nontender. No organomegaly or masses felt. Normal bowel sounds heard. Central nervous system: Alert and oriented. No focal neurological deficits. Extremities: trace edema BLEs. Skin: No rashes, lesions or ulcers Psychiatry: Judgement and insight appear normal. Mood & affect appropriate.   Data Reviewed: I have personally reviewed following labs and imaging studies  CBC: Recent Labs  Lab 07/31/19 0502 07/31/19 0502 08/01/19 0437 08/01/19 2247 08/02/19 0410 08/03/19 0829 08/04/19 0631  WBC 15.6*  --  16.7*  --  13.1* 15.3* 14.1*  HGB 7.6*   < > 7.7* 8.9* 8.7* 9.3* 8.8*  HCT 26.1*   < > 27.2* 29.7* 29.7* 32.1* 30.9*  MCV 88.8  --  90.1  --  90.3 89.9 91.4  PLT 1,851*  --  1,903*  --  1,869* 2,201* 2,069*   < > = values in this interval not displayed.   Basic Metabolic Panel: Recent Labs  Lab 07/31/19  0502 08/01/19 0437 08/02/19 0410 08/03/19 0829 08/04/19 0631  NA 137 137 140 139 142  K 4.6 4.3 3.9 3.7 3.4*  CL 106 106 107 105 103  CO2 24 23 25 28 31   GLUCOSE 113* 115* 99 126* 116*  BUN 48* 38* 33* 25* 18  CREATININE 1.41* 1.19* 1.22* 1.09* 0.94  CALCIUM 8.8* 8.7* 8.8* 8.7* 8.9  MG  --   --  2.7*  --  2.3   GFR: Estimated Creatinine Clearance: 47.7 mL/min (by C-G formula based on SCr of 0.94 mg/dL). Liver Function Tests: No results for input(s): AST, ALT, ALKPHOS, BILITOT, PROT, ALBUMIN in the last 168 hours. No results for input(s): LIPASE, AMYLASE in the last 168 hours. No results for input(s): AMMONIA in the last 168 hours. Coagulation Profile: No results for input(s): INR, PROTIME in the last 168 hours. Cardiac Enzymes: No results for input(s): CKTOTAL, CKMB, CKMBINDEX, TROPONINI in the last 168 hours. BNP (last 3 results) No results for input(s): PROBNP in the last 8760 hours. HbA1C: No results for input(s): HGBA1C in the last 72 hours. CBG: No results for input(s):  GLUCAP in the last 168 hours. Lipid Profile: No results for input(s): CHOL, HDL, LDLCALC, TRIG, CHOLHDL, LDLDIRECT in the last 72 hours. Thyroid Function Tests: No results for input(s): TSH, T4TOTAL, FREET4, T3FREE, THYROIDAB in the last 72 hours. Anemia Panel: No results for input(s): VITAMINB12, FOLATE, FERRITIN, TIBC, IRON, RETICCTPCT in the last 72 hours. Sepsis Labs: Recent Labs  Lab 07/29/19 0629  PROCALCITON <0.10    Recent Results (from the past 240 hour(s))  Respiratory Panel by RT PCR (Flu A&B, Covid) - Nasopharyngeal Swab     Status: None   Collection Time: 07/27/19  3:45 PM   Specimen: Nasopharyngeal Swab  Result Value Ref Range Status   SARS Coronavirus 2 by RT PCR NEGATIVE NEGATIVE Final    Comment: (NOTE) SARS-CoV-2 target nucleic acids are NOT DETECTED. The SARS-CoV-2 RNA is generally detectable in upper respiratoy specimens during the acute phase of infection. The lowest concentration of SARS-CoV-2 viral copies this assay can detect is 131 copies/mL. A negative result does not preclude SARS-Cov-2 infection and should not be used as the sole basis for treatment or other patient management decisions. A negative result may occur with  improper specimen collection/handling, submission of specimen other than nasopharyngeal swab, presence of viral mutation(s) within the areas targeted by this assay, and inadequate number of viral copies (<131 copies/mL). A negative result must be combined with clinical observations, patient history, and epidemiological information. The expected result is Negative. Fact Sheet for Patients:  PinkCheek.be Fact Sheet for Healthcare Providers:  GravelBags.it This test is not yet ap proved or cleared by the [place] FDA and  has been authorized for detection and/or diagnosis of SARS-CoV-2 by FDA under an Emergency Use Authorization (EUA). This EUA will remain  in effect  (meaning this test can be used) for the duration of the COVID-19 declaration under Section 564(b)(1) of the Act, 21 U.S.C. section 360bbb-3(b)(1), unless the authorization is terminated or revoked sooner.    Influenza A by PCR NEGATIVE NEGATIVE Final   Influenza B by PCR NEGATIVE NEGATIVE Final    Comment: (NOTE) The Xpert Xpress SARS-CoV-2/FLU/RSV assay is intended as an aid in  the diagnosis of influenza from Nasopharyngeal swab specimens and  should not be used as a sole basis for treatment. Nasal washings and  aspirates are unacceptable for Xpert Xpress SARS-CoV-2/FLU/RSV  testing. Fact Sheet for Patients: PinkCheek.be Fact  Sheet for Healthcare Providers: GravelBags.it This test is not yet approved or cleared by the [place] and  has been authorized for detection and/or diagnosis of SARS-CoV-2 by  FDA under an Emergency Use Authorization (EUA). This EUA will remain  in effect (meaning this test can be used) for the duration of the  Covid-19 declaration under Section 564(b)(1) of the Act, 21  U.S.C. section 360bbb-3(b)(1), unless the authorization is  terminated or revoked. Performed at [facility], [address], [place], [place] [address]   Blood Culture (routine x 2)     Status: None   Collection Time: 07/27/19  5:20 PM   Specimen: BLOOD RIGHT HAND  Result Value Ref Range Status   Specimen Description BLOOD RIGHT HAND  Final   Special Requests   Final    BOTTLES DRAWN AEROBIC AND ANAEROBIC Blood Culture adequate volume   Culture   Final    NO GROWTH 5 DAYS Performed at [facility], [address], [place], [place] [address]    Report Status 08/01/2019 FINAL  Final  Blood Culture (routine x 2)     Status: None   Collection Time: 07/27/19  5:20 PM   Specimen: Left Antecubital; Blood  Result Value Ref Range Status   Specimen Description LEFT ANTECUBITAL  Final   Special Requests   Final    BOTTLES DRAWN  AEROBIC AND ANAEROBIC Blood Culture adequate volume   Culture   Final    NO GROWTH 5 DAYS Performed at [facility], [address] [place], [place] [address]    Report Status 08/01/2019 FINAL  Final  Urine culture     Status: Abnormal   Collection Time: 07/27/19  6:35 PM   Specimen: In/Out Cath Urine  Result Value Ref Range Status   Specimen Description   Final    IN/OUT CATH URINE Performed at [facility], [address] [place], [place] [address]    Special Requests   Final    NONE Performed at [facility], [address], [place], [place] [address]    Culture 50,000 COLONIES/mL ENTEROCOCCUS FAECALIS (A)  Final   Report Status 07/30/2019 FINAL  Final   Organism ID, Bacteria ENTEROCOCCUS FAECALIS (A)  Final      Susceptibility   Enterococcus faecalis - MIC*    AMPICILLIN <=2 SENSITIVE Sensitive     NITROFURANTOIN <=16 SENSITIVE Sensitive     VANCOMYCIN 2 SENSITIVE Sensitive     * 50,000 COLONIES/mL ENTEROCOCCUS FAECALIS  MRSA PCR Screening     Status: None   Collection Time: 07/29/19  6:03 AM   Specimen: Nasal Mucosa; Nasopharyngeal  Result Value Ref Range Status   MRSA by PCR NEGATIVE NEGATIVE Final    Comment:        The GeneXpert MRSA Assay (FDA approved for NASAL specimens only), is one component of a comprehensive MRSA colonization surveillance program. It is not intended to diagnose MRSA infection nor to guide or monitor treatment for MRSA infections. Performed at [facility], [address] [place], [place] [address]   SARS CORONAVIRUS 2 (TAT 6-24 HRS) Nasopharyngeal Nasopharyngeal Swab     Status: None   Collection Time: 08/01/19  4:45 AM   Specimen: Nasopharyngeal Swab  Result Value Ref Range Status   SARS Coronavirus 2 NEGATIVE NEGATIVE Final    Comment: (NOTE) SARS-CoV-2 target nucleic acids are NOT DETECTED. The SARS-CoV-2 RNA is generally detectable in upper and lower respiratory specimens during the acute phase of infection. Negative results do  not preclude SARS-CoV-2 infection, do not rule out co-infections with other pathogens, and should not be used as the sole basis for treatment or other patient management decisions. Negative results must be combined with clinical observations, patient history, and epidemiological information. The expected result is Negative. Fact Sheet for Patients: SugarRoll.be Fact Sheet for Healthcare Providers: https://www.woods-mathews.com/ This test is not yet approved or cleared by the [place] FDA and  has been authorized for detection and/or diagnosis of SARS-CoV-2 by FDA under an Emergency Use Authorization (EUA). This EUA will remain  in effect (meaning this test can be used) for the duration of the COVID-19 declaration under Section 56 4(b)(1) of the Act, 21 U.S.C. section 360bbb-3(b)(1), unless the authorization is terminated or revoked sooner. Performed at [facility], [place] [address] [place], [place] [address]   SARS CORONAVIRUS 2 (TAT 6-24 HRS) Nasopharyngeal Nasopharyngeal Swab     Status: None   Collection Time: 08/02/19  2:56 PM   Specimen: Nasopharyngeal Swab  Result Value Ref Range Status   SARS Coronavirus 2 NEGATIVE NEGATIVE Final    Comment: (NOTE) SARS-CoV-2 target nucleic acids are NOT DETECTED. The SARS-CoV-2 RNA is generally detectable in upper and lower respiratory specimens during the acute phase of infection. Negative results do not preclude SARS-CoV-2 infection, do not rule out co-infections with other pathogens, and should not be used as the sole basis for treatment or other patient management decisions. Negative results must be combined with clinical observations, patient history, and epidemiological information. The expected result is Negative. Fact Sheet for Patients: SugarRoll.be Fact Sheet for Healthcare Providers: https://www.woods-mathews.com/ This test is not yet  approved or cleared by the [place] FDA and  has been authorized for detection and/or diagnosis of SARS-CoV-2 by FDA under an Emergency Use Authorization (EUA). This EUA will remain  in effect (meaning this test can be used) for the duration of the COVID-19 declaration under Section 56 4(b)(1) of the Act, 21 U.S.C. section 360bbb-3(b)(1), unless the authorization is terminated or revoked sooner. Performed at [facility], [place] [address], [place], [place] [address]    Radiology Studies: DG CHEST PORT 1 VIEW  Result Date: 08/04/2019 CLINICAL DATA:  Shortness of breath. EXAM: PORTABLE CHEST 1 VIEW COMPARISON:  08/02/2019 FINDINGS: Stable enlarged cardiac silhouette, prosthetic aortic and mitral valves, mediastinal surgical clips and left atrial appendage clip. Moderate-sized right pleural effusion with a mild increase in amount. Mild increase in associated right basilar atelectasis or pneumonia. Decreased prominence of the pulmonary vasculature and interstitial markings. Diffuse osteopenia. IMPRESSION: 1. Mild increase in size of the moderate-sized right pleural effusion and right basilar atelectasis or pneumonia. 2. Stable cardiomegaly with improved pulmonary vascular congestion and interstitial pulmonary edema. Electronically Signed   By: Claudie Revering M.D.   On: 08/04/2019 11:34   Scheduled Meds: . apixaban  5 mg Oral BID  . arformoterol  15 mcg Nebulization BID  . atorvastatin  40 mg Oral q1800  . budesonide (PULMICORT) nebulizer solution  0.5 mg Nebulization BID  . busPIRone  10 mg Oral BID  . diltiazem  60 mg Oral Q6H  . docusate sodium  200 mg Oral QHS  . DULoxetine  20 mg Oral Daily  . feeding supplement (ENSURE ENLIVE)  237 mL Oral Q24H  . ferrous sulfate  325 mg Oral Q breakfast  . hydroxyurea  1,000 mg Oral Daily  . mouth rinse  15 mL Mouth Rinse BID  . metoprolol succinate  25 mg Oral Daily  .  multivitamin with minerals  1 tablet Oral Daily  . polyethylene glycol  17  g Oral BID   Continuous Infusions:    LOS: 8 days   Time spent: 30 minutes  Lena Fieldhouse Wynetta Emery, MD [facility] How to contact the [facility] Attending or Consulting provider [place] or covering provider during after hours [place], for this patient?  1. Check the care team in [facility] and look for a) attending/consulting [facility] provider listed and b) the [facility] team listed 2. Log into [email or web address] and use [place]'s universal password to access. If you do not have the password, please contact the hospital operator. 3. Locate the [facility] provider you are looking for under [facility] and page to a number that you can be directly reached. 4. If you still have difficulty reaching the provider, please page the [facility] (Director on Call) for the Hospitalists listed on amion for assistance.   If 7PM-7AM, please contact night-coverage [email or web address] Password [facility] 08/04/2019, 4:13 PM

## 2019-08-05 LAB — BASIC METABOLIC PANEL
Anion gap: 6 (ref 5–15)
BUN: 17 mg/dL (ref 8–23)
CO2: 31 mmol/L (ref 22–32)
Calcium: 9.1 mg/dL (ref 8.9–10.3)
Chloride: 103 mmol/L (ref 98–111)
Creatinine, Ser: 0.94 mg/dL (ref 0.44–1.00)
GFR calc Af Amer: >60 mL/min (ref >60)
GFR calc non Af Amer: 56 mL/min — ABNORMAL LOW (ref >60)
Glucose, Bld: 144 mg/dL — ABNORMAL HIGH (ref 70–99)
Potassium: 4 mmol/L (ref 3.5–5.1)
Sodium: 140 mmol/L (ref 135–145)

## 2019-08-05 LAB — CBC
HCT: 31.4 % — ABNORMAL LOW (ref 36.0–46.0)
Hemoglobin: 9 g/dL — ABNORMAL LOW (ref 12.0–15.0)
MCH: 26 pg (ref 26.0–34.0)
MCHC: 28.7 g/dL — ABNORMAL LOW (ref 30.0–36.0)
MCV: 90.8 fL (ref 80.0–100.0)
Platelets: 2148 10*3/uL (ref 150–400)
RBC: 3.46 MIL/uL — ABNORMAL LOW (ref 3.87–5.11)
RDW: 17.3 % — ABNORMAL HIGH (ref 11.5–15.5)
WBC: 16.8 10*3/uL — ABNORMAL HIGH (ref 4.0–10.5)
nRBC: 0 % (ref 0.0–0.2)

## 2019-08-05 LAB — MAGNESIUM: Magnesium: 2.5 mg/dL — ABNORMAL HIGH (ref 1.7–2.4)

## 2019-08-05 MED ORDER — POLYETHYLENE GLYCOL 3350 17 G PO PACK: 17.0000 g | PACK | Freq: Every day | ORAL | Status: DC | PRN

## 2019-08-05 MED ORDER — FUROSEMIDE 10 MG/ML IJ SOLN
30.0000 mg | Freq: Once | INTRAMUSCULAR | Status: AC
  Administered 2019-08-05: 30 mg via INTRAVENOUS
  Filled 2019-08-05: qty 4

## 2019-08-05 NOTE — Progress Notes (Signed)
CRITICAL VALUE ALERT  Critical Value: Platelets 2148  Date & Time Notied:  08/05/2019, 0800  Provider Notified: Dr. Wynetta Emery

## 2019-08-05 NOTE — Progress Notes (Addendum)
PROGRESS NOTE   Nicole [name]  [id number] DOB: 30-Mar-1936 DOA: 07/27/2019 PCP: Frances Maywood, FNP   Brief Narrative:  Per HPI: Nicole [name] is a 84 y.o. female with a history of COPD, diastolic heart failure, atrial fibrillation on anticoagulation, CML with thrombocytosis.  She was recently admitted about a month ago for similar complaints.  She presented with Afib and RVR.  She did complete doxycycline for total of 5 days for bronchitis/bronchiectasis.   On day of admission she presented with 1 week of worsening shortness of breath which has been worsening and is now severe.  Symptoms are worse with ambulation and improved with rest.  No other palliating or provoking factors.   She had been prescribed doxycycline and prednisone by her oncology physician, although she has not been get these medications.   Assessment & Plan:   Active Problems:   CAD (coronary artery disease)   Atrial fibrillation (HCC)   Chronic diastolic CHF (congestive heart failure) (HCC)   Aortic stenosis   Thrombocytosis (HCC)   COPD exacerbation (HCC)   CML (chronic myelocytic leukemia) ([place])   HCAP (healthcare-associated pneumonia)   Acute respiratory failure with hypoxia (HCC)   DNR (do not resuscitate)   Pleural effusion   Sepsis ([place])  Acute hypoxemic respiratory failure likely secondary to HCAP with some mild pulmonary vascular congestion with acute on chronic diastolic heart failure -completed cefepime.  -bronchodilators as needed.  -Patient is normally on room air at home, wean oxygen as able -Appreciate PCCM ongoing evaluation.  They that her COPD has been progressing.   -Continue IS -IV lasix 30 mg x 1 dose given 08/04/19.  Will give additional 30 mg IV lasix 08/05/19.       Atrial fibrillation with RVR -Currently rate controlled and diltiazem is being titrated by cardiology service -Failed prior DCCV and trial of amiodarone -Appreciate cardiology evaluation with recommendations to continue short  acting Cardizem and metoprolol XL. -Continue apixaban for full anticoagulation - Titrating rate control meds have been challenging due to soft BPs.     AKI on CKD stage 3a -monitoring while being diuresed with IV lasix -Creatinine improved to 0.94 -Monitoring urine output   COPD with mild exacerbation related to above-improving -Steroids held on 2/28 and albuterol scheduled switch to Xopenex as needed   CML with thrombocytosis -Followed by Dr. Delton Coombes outpatient -Hemoglobin 7.6 and will consider transfusion for hemoglobin less than 7 -Discussed with Dr. Halford Chessman, he asked for a hematology consultation given acute decline in condition, patient had not yet started Gleevec.   -Consulted Dr. Delton Coombes, he has started patient on hydrea until she can obtain the [place].  Platelets remain high.  Pt now on 1000 mg of hydroxyurea.  Dr. Raliegh Ip is working with his team to help get the Gleevac for the patient or another alternative if necessary.  For now, the only thing we can offer in the hospital is the hydroxyurea.  I plan to speak with Dr. Raliegh Ip tomorrow to discuss current platelet count and obtain further recommendations.    Goiter with signs of hyperthyroidism -Previously had been on amiodarone -T4 level slightly elevated with low TSH -We will need endocrinology follow-up outpatient  Anemia in neoplastic disease - pt is s/p 1 unit PRBC on 08/01/19 and Hg improved to 9.0.   Pt is feeling a lot better after the transfusion and seems to be stronger.     DVT prophylaxis: Apixaban Code Status: DNR Family Communication: Discussed with daughter at bedside 08/04/19 Disposition Plan: from  home, but too weak and deconditioned to return home, agreeable to SNF, Pt requiring ongoing IV lasix, hematology consulted regarding CML with thrombocytosis and started on hydrea, cardiology working on diltiazem dose titrations which has been challenging given patient's soft blood pressures, not medically ready to discharge.     Consultants:  PCCM Cardiology   Procedures:  See below  Antimicrobials:  Anti-infectives (From admission, onward)    Start     Dose/Rate Route Frequency Ordered Stop   07/31/19 1800  ceFEPIme (MAXIPIME) 2 g in sodium chloride 0.9 % 100 mL IVPB     2 g 200 mL/hr over 30 Minutes Intravenous  Once 07/31/19 0837 07/31/19 2047   07/31/19 0600  ceFEPIme (MAXIPIME) 2 g in sodium chloride 0.9 % 100 mL IVPB     2 g 200 mL/hr over 30 Minutes Intravenous Every 24 hours 07/30/19 1144 07/31/19 2048   07/28/19 1800  vancomycin (VANCOREADY) IVPB 500 mg/100 mL  Status:  Discontinued     500 mg 100 mL/hr over 60 Minutes Intravenous Every 24 hours 07/27/19 1744 07/29/19 0945   07/27/19 1800  ceFEPIme (MAXIPIME) 2 g in sodium chloride 0.9 % 100 mL IVPB  Status:  Discontinued     2 g 200 mL/hr over 30 Minutes Intravenous Every 12 hours 07/27/19 1714 07/30/19 1144   07/27/19 1800  vancomycin (VANCOREADY) IVPB 1500 mg/300 mL     1,500 mg 150 mL/hr over 120 Minutes Intravenous  Once 07/27/19 1714 07/27/19 2012   07/27/19 1700  vancomycin (VANCOCIN) IVPB 1000 mg/200 mL premix  Status:  Discontinued     1,000 mg 200 mL/hr over 60 Minutes Intravenous  Once 07/27/19 1658 07/27/19 1714   07/27/19 1700  ceFEPIme (MAXIPIME) 2 g in sodium chloride 0.9 % 100 mL IVPB  Status:  Discontinued     2 g 200 mL/hr over 30 Minutes Intravenous  Once 07/27/19 1658 07/27/19 1714       Subjective: Patient says she is feeling good this morning.  She ate some of her breakfast.  She sat in chair for a while yesterday.        Objective: Vitals:   08/05/19 0053 08/05/19 0444 08/05/19 0739 08/05/19 0833  BP: 122/66 128/78  117/70  Pulse: 82 68  64  Resp:  (!) 22  (!) 28  Temp:  97.8 F (36.6 C)  98.5 F (36.9 C)  TempSrc:  Oral  Oral  SpO2:  99% 97% 95%  Weight:      Height:        Intake/Output Summary (Last 24 hours) at 08/05/2019 1044 Last data filed at 08/05/2019 0900 Gross per 24 hour  Intake 240 ml   Output 450 ml  Net -210 ml   Filed Weights   08/02/19 0500 08/03/19 0500 08/04/19 0644  Weight: 87 kg 86.4 kg 85.2 kg   Examination:  General exam: very frail and weak, sitting up on side of bed, eating breakfast, alert and oriented today.   Respiratory system: poor air movement, crackles at bases unchanged.  Cardiovascular system: S1 & S2 heard, irregularly irregular. Trace pedal edema.  Gastrointestinal system: Abdomen is nondistended, soft and nontender. No organomegaly or masses felt. Normal bowel sounds heard. Central nervous system: Alert and oriented. No focal neurological deficits. Extremities: trace edema BLEs. Skin: No rashes, lesions or ulcers Psychiatry: Judgement and insight appear normal. Mood & affect appropriate.   Data Reviewed: I have personally reviewed following labs and imaging studies  CBC: Recent Labs  Lab  08/01/19 [id number] 08/01/19 [id number] 08/01/19 2247 08/02/19 0410 08/03/19 0829 08/04/19 0631 08/05/19 0653  WBC 16.7*  --   --  13.1* 15.3* 14.1* 16.8*  HGB 7.7*   < > 8.9* 8.7* 9.3* 8.8* 9.0*  HCT 27.2*   < > 29.7* 29.7* 32.1* 30.9* 31.4*  MCV 90.1  --   --  90.3 89.9 91.4 90.8  PLT 1,903*  --   --  1,869* 2,201* 2,069* 2,148*   < > = values in this interval not displayed.   Basic Metabolic Panel: Recent Labs  Lab 08/01/19 0437 08/02/19 0410 08/03/19 0829 08/04/19 0631 08/05/19 0653  NA 137 140 139 142 140  K 4.3 3.9 3.7 3.4* 4.0  CL 106 107 105 103 103  CO2 23 25 28 31 31   GLUCOSE 115* 99 126* 116* 144*  BUN 38* 33* 25* 18 17  CREATININE 1.19* 1.22* 1.09* 0.94 0.94  CALCIUM 8.7* 8.8* 8.7* 8.9 9.1  MG  --  2.7*  --  2.3 2.5*   GFR: Estimated Creatinine Clearance: 47.7 mL/min (by C-G formula based on SCr of 0.94 mg/dL). Liver Function Tests: No results for input(s): AST, ALT, ALKPHOS, BILITOT, PROT, ALBUMIN in the last 168 hours. No results for input(s): LIPASE, AMYLASE in the last 168 hours. No results for input(s): AMMONIA in the last  168 hours. Coagulation Profile: No results for input(s): INR, PROTIME in the last 168 hours. Cardiac Enzymes: No results for input(s): CKTOTAL, CKMB, CKMBINDEX, TROPONINI in the last 168 hours. BNP (last 3 results) No results for input(s): PROBNP in the last 8760 hours. HbA1C: No results for input(s): HGBA1C in the last 72 hours. CBG: No results for input(s): GLUCAP in the last 168 hours. Lipid Profile: No results for input(s): CHOL, HDL, LDLCALC, TRIG, CHOLHDL, LDLDIRECT in the last 72 hours. Thyroid Function Tests: No results for input(s): TSH, T4TOTAL, FREET4, T3FREE, THYROIDAB in the last 72 hours. Anemia Panel: No results for input(s): VITAMINB12, FOLATE, FERRITIN, TIBC, IRON, RETICCTPCT in the last 72 hours. Sepsis Labs: No results for input(s): PROCALCITON, LATICACIDVEN in the last 168 hours.  Recent Results (from the past 240 hour(s))  Respiratory Panel by RT PCR (Flu A&B, Covid) - Nasopharyngeal Swab     Status: None   Collection Time: 07/27/19  3:45 PM   Specimen: Nasopharyngeal Swab  Result Value Ref Range Status   SARS Coronavirus 2 by RT PCR NEGATIVE NEGATIVE Final    Comment: (NOTE) SARS-CoV-2 target nucleic acids are NOT DETECTED. The SARS-CoV-2 RNA is generally detectable in upper respiratoy specimens during the acute phase of infection. The lowest concentration of SARS-CoV-2 viral copies this assay can detect is 131 copies/mL. A negative result does not preclude SARS-Cov-2 infection and should not be used as the sole basis for treatment or other patient management decisions. A negative result may occur with  improper specimen collection/handling, submission of specimen other than nasopharyngeal swab, presence of viral mutation(s) within the areas targeted by this assay, and inadequate number of viral copies (<131 copies/mL). A negative result must be combined with clinical observations, patient history, and epidemiological information. The expected result is  Negative. Fact Sheet for Patients:  PinkCheek.be Fact Sheet for Healthcare Providers:  GravelBags.it This test is not yet ap proved or cleared by the [place] FDA and  has been authorized for detection and/or diagnosis of SARS-CoV-2 by FDA under an Emergency Use Authorization (EUA). This EUA will remain  in effect (meaning this test can be used) for the duration of  the COVID-19 declaration under Section 564(b)(1) of the Act, 21 U.S.C. section 360bbb-3(b)(1), unless the authorization is terminated or revoked sooner.    Influenza A by PCR NEGATIVE NEGATIVE Final   Influenza B by PCR NEGATIVE NEGATIVE Final    Comment: (NOTE) The Xpert Xpress SARS-CoV-2/FLU/RSV assay is intended as an aid in  the diagnosis of influenza from Nasopharyngeal swab specimens and  should not be used as a sole basis for treatment. Nasal washings and  aspirates are unacceptable for Xpert Xpress SARS-CoV-2/FLU/RSV  testing. Fact Sheet for Patients: PinkCheek.be Fact Sheet for Healthcare Providers: GravelBags.it This test is not yet approved or cleared by the [place] FDA and  has been authorized for detection and/or diagnosis of SARS-CoV-2 by  FDA under an Emergency Use Authorization (EUA). This EUA will remain  in effect (meaning this test can be used) for the duration of the  Covid-19 declaration under Section 564(b)(1) of the Act, 21  U.S.C. section 360bbb-3(b)(1), unless the authorization is  terminated or revoked. Performed at [facility], [address] [place], [place] [address]   Blood Culture (routine x 2)     Status: None   Collection Time: 07/27/19  5:20 PM   Specimen: BLOOD RIGHT HAND  Result Value Ref Range Status   Specimen Description BLOOD RIGHT HAND  Final   Special Requests   Final    BOTTLES DRAWN AEROBIC AND ANAEROBIC Blood Culture adequate volume   Culture    Final    NO GROWTH 5 DAYS Performed at [facility], [address], [place], [place] [address]    Report Status 08/01/2019 FINAL  Final  Blood Culture (routine x 2)     Status: None   Collection Time: 07/27/19  5:20 PM   Specimen: Left Antecubital; Blood  Result Value Ref Range Status   Specimen Description LEFT ANTECUBITAL  Final   Special Requests   Final    BOTTLES DRAWN AEROBIC AND ANAEROBIC Blood Culture adequate volume   Culture   Final    NO GROWTH 5 DAYS Performed at [facility], [address], [place], [place] [address]    Report Status 08/01/2019 FINAL  Final  Urine culture     Status: Abnormal   Collection Time: 07/27/19  6:35 PM   Specimen: In/Out Cath Urine  Result Value Ref Range Status   Specimen Description   Final    IN/OUT CATH URINE Performed at [facility], [address] [place], [place] [address]    Special Requests   Final    NONE Performed at [facility], [address] [place], [place] [address]    Culture 50,000 COLONIES/mL ENTEROCOCCUS FAECALIS (A)  Final   Report Status 07/30/2019 FINAL  Final   Organism ID, Bacteria ENTEROCOCCUS FAECALIS (A)  Final      Susceptibility   Enterococcus faecalis - MIC*    AMPICILLIN <=2 SENSITIVE Sensitive     NITROFURANTOIN <=16 SENSITIVE Sensitive     VANCOMYCIN 2 SENSITIVE Sensitive     * 50,000 COLONIES/mL ENTEROCOCCUS FAECALIS  MRSA PCR Screening     Status: None   Collection Time: 07/29/19  6:03 AM   Specimen: Nasal Mucosa; Nasopharyngeal  Result Value Ref Range Status   MRSA by PCR NEGATIVE NEGATIVE Final    Comment:        The GeneXpert MRSA Assay (FDA approved for NASAL specimens only), is one component of a comprehensive MRSA colonization surveillance program. It is not intended to diagnose MRSA infection nor to  guide or monitor treatment for MRSA infections. Performed at [facility], [address], [place], [place] [address]   SARS CORONAVIRUS 2 (TAT 6-24 HRS) Nasopharyngeal Nasopharyngeal  Swab     Status: None   Collection Time: 08/01/19  4:45 AM   Specimen: Nasopharyngeal Swab  Result Value Ref Range Status   SARS Coronavirus 2 NEGATIVE NEGATIVE Final    Comment: (NOTE) SARS-CoV-2 target nucleic acids are NOT DETECTED. The SARS-CoV-2 RNA is generally detectable in upper and lower respiratory specimens during the acute phase of infection. Negative results do not preclude SARS-CoV-2 infection, do not rule out co-infections with other pathogens, and should not be used as the sole basis for treatment or other patient management decisions. Negative results must be combined with clinical observations, patient history, and epidemiological information. The expected result is Negative. Fact Sheet for Patients: SugarRoll.be Fact Sheet for Healthcare Providers: https://www.woods-mathews.com/ This test is not yet approved or cleared by the [place] FDA and  has been authorized for detection and/or diagnosis of SARS-CoV-2 by FDA under an Emergency Use Authorization (EUA). This EUA will remain  in effect (meaning this test can be used) for the duration of the COVID-19 declaration under Section 56 4(b)(1) of the Act, 21 U.S.C. section 360bbb-3(b)(1), unless the authorization is terminated or revoked sooner. Performed at [facility], [place] [address] [place], [place] [address]   SARS CORONAVIRUS 2 (TAT 6-24 HRS) Nasopharyngeal Nasopharyngeal Swab     Status: None   Collection Time: 08/02/19  2:56 PM   Specimen: Nasopharyngeal Swab  Result Value Ref Range Status   SARS Coronavirus 2 NEGATIVE NEGATIVE Final    Comment: (NOTE) SARS-CoV-2 target nucleic acids are NOT DETECTED. The SARS-CoV-2 RNA is generally detectable in upper and lower respiratory specimens during the acute phase of infection. Negative results do not preclude SARS-CoV-2 infection, do not rule out co-infections with other pathogens, and should not be used as  the sole basis for treatment or other patient management decisions. Negative results must be combined with clinical observations, patient history, and epidemiological information. The expected result is Negative. Fact Sheet for Patients: SugarRoll.be Fact Sheet for Healthcare Providers: https://www.woods-mathews.com/ This test is not yet approved or cleared by the [place] FDA and  has been authorized for detection and/or diagnosis of SARS-CoV-2 by FDA under an Emergency Use Authorization (EUA). This EUA will remain  in effect (meaning this test can be used) for the duration of the COVID-19 declaration under Section 56 4(b)(1) of the Act, 21 U.S.C. section 360bbb-3(b)(1), unless the authorization is terminated or revoked sooner. Performed at [facility], [place] [address], [place],  [address]    Radiology Studies: DG CHEST PORT 1 VIEW  Result Date: 08/04/2019 CLINICAL DATA:  Shortness of breath. EXAM: PORTABLE CHEST 1 VIEW COMPARISON:  08/02/2019 FINDINGS: Stable enlarged cardiac silhouette, prosthetic aortic and mitral valves, mediastinal surgical clips and left atrial appendage clip. Moderate-sized right pleural effusion with a mild increase in amount. Mild increase in associated right basilar atelectasis or pneumonia. Decreased prominence of the pulmonary vasculature and interstitial markings. Diffuse osteopenia. IMPRESSION: 1. Mild increase in size of the moderate-sized right pleural effusion and right basilar atelectasis or pneumonia. 2. Stable cardiomegaly with improved pulmonary vascular congestion and interstitial pulmonary edema. Electronically Signed   By: Claudie Revering M.D.   On: 08/04/2019 11:34   Scheduled Meds:  apixaban  5 mg Oral BID   arformoterol  15 mcg Nebulization BID   atorvastatin  40  mg Oral q1800   budesonide (PULMICORT) nebulizer solution  0.5 mg Nebulization BID   busPIRone  10 mg Oral BID   diltiazem  60  mg Oral Q6H   docusate sodium  200 mg Oral QHS   DULoxetine  20 mg Oral Daily   feeding supplement (ENSURE ENLIVE)  237 mL Oral Q24H   ferrous sulfate  325 mg Oral Q breakfast   hydroxyurea  1,000 mg Oral Daily   mouth rinse  15 mL Mouth Rinse BID   metoprolol succinate  25 mg Oral Daily   multivitamin with minerals  1 tablet Oral Daily   polyethylene glycol  17 g Oral BID   Continuous Infusions:     LOS: 9 days   Time spent: 30 minutes  Calirose Mccance Wynetta Emery, MD [facility] How to contact the [facility] Attending or Consulting provider [place] or covering provider during after hours [place], for this patient?  Check the care team in [facility] and look for a) attending/consulting [facility] provider listed and b) the [facility] team listed Log into [email or web address] and use [place]'s universal password to access. If you do not have the password, please contact the hospital operator. Locate the [facility] provider you are looking for under [facility] and page to a number that you can be directly reached. If you still have difficulty reaching the provider, please page the [facility] (Director on Call) for the Hospitalists listed on amion for assistance.   If 7PM-7AM, please contact night-coverage [email or web address] Password [facility] 08/05/2019, 10:44 AM

## 2019-08-06 ENCOUNTER — Telehealth (HOSPITAL_COMMUNITY): Payer: Self-pay | Admitting: Pharmacist

## 2019-08-06 ENCOUNTER — Inpatient Hospital Stay (HOSPITAL_COMMUNITY): Payer: Medicare Other

## 2019-08-06 DIAGNOSIS — I5023 Acute on chronic systolic (congestive) heart failure: Secondary | ICD-10-CM

## 2019-08-06 DIAGNOSIS — I4821 Permanent atrial fibrillation: Secondary | ICD-10-CM

## 2019-08-06 DIAGNOSIS — J90 Pleural effusion, not elsewhere classified: Secondary | ICD-10-CM

## 2019-08-06 DIAGNOSIS — I509 Heart failure, unspecified: Secondary | ICD-10-CM

## 2019-08-06 LAB — PREPARE RBC (CROSSMATCH)

## 2019-08-06 LAB — CBC WITH DIFFERENTIAL/PLATELET
Abs Immature Granulocytes: 0.23 10*3/uL — ABNORMAL HIGH (ref 0.00–0.07)
Basophils Absolute: 0.2 10*3/uL — ABNORMAL HIGH (ref 0.0–0.1)
Basophils Relative: 1 %
Eosinophils Absolute: 0.2 10*3/uL (ref 0.0–0.5)
Eosinophils Relative: 1 %
HCT: 28.7 % — ABNORMAL LOW (ref 36.0–46.0)
Hemoglobin: 8.2 g/dL — ABNORMAL LOW (ref 12.0–15.0)
Immature Granulocytes: 2 %
Lymphocytes Relative: 7 %
Lymphs Abs: 0.9 10*3/uL (ref 0.7–4.0)
MCH: 26 pg (ref 26.0–34.0)
MCHC: 28.6 g/dL — ABNORMAL LOW (ref 30.0–36.0)
MCV: 91.1 fL (ref 80.0–100.0)
Monocytes Absolute: 1 10*3/uL (ref 0.1–1.0)
Monocytes Relative: 7 %
Neutro Abs: 11.1 10*3/uL — ABNORMAL HIGH (ref 1.7–7.7)
Neutrophils Relative %: 82 %
Platelets: 1917 10*3/uL (ref 150–400)
RBC: 3.15 MIL/uL — ABNORMAL LOW (ref 3.87–5.11)
RDW: 17.5 % — ABNORMAL HIGH (ref 11.5–15.5)
WBC: 13.6 10*3/uL — ABNORMAL HIGH (ref 4.0–10.5)
nRBC: 0 % (ref 0.0–0.2)

## 2019-08-06 LAB — BASIC METABOLIC PANEL
Anion gap: 9 (ref 5–15)
BUN: 21 mg/dL (ref 8–23)
CO2: 32 mmol/L (ref 22–32)
Calcium: 9 mg/dL (ref 8.9–10.3)
Chloride: 101 mmol/L (ref 98–111)
Creatinine, Ser: 0.92 mg/dL (ref 0.44–1.00)
GFR calc Af Amer: >60 mL/min (ref >60)
GFR calc non Af Amer: 58 mL/min — ABNORMAL LOW (ref >60)
Glucose, Bld: 125 mg/dL — ABNORMAL HIGH (ref 70–99)
Potassium: 3.3 mmol/L — ABNORMAL LOW (ref 3.5–5.1)
Sodium: 142 mmol/L (ref 135–145)

## 2019-08-06 LAB — SARS CORONAVIRUS 2 (TAT 6-24 HRS): SARS Coronavirus 2: NEGATIVE

## 2019-08-06 LAB — MAGNESIUM: Magnesium: 2.2 mg/dL (ref 1.7–2.4)

## 2019-08-06 MED ORDER — FUROSEMIDE 10 MG/ML IJ SOLN
40.0000 mg | Freq: Every day | INTRAMUSCULAR | Status: DC
  Administered 2019-08-06 – 2019-08-08 (×3): 40 mg via INTRAVENOUS
  Filled 2019-08-06 (×3): qty 4

## 2019-08-06 MED ORDER — SODIUM CHLORIDE 0.9% IV SOLUTION: Freq: Once | INTRAVENOUS | Status: AC

## 2019-08-06 MED ORDER — DILTIAZEM HCL ER COATED BEADS 180 MG PO CP24
180.0000 mg | ORAL_CAPSULE | Freq: Every day | ORAL | Status: DC
  Administered 2019-08-06 – 2019-08-09 (×4): 180 mg via ORAL
  Filled 2019-08-06 (×4): qty 1

## 2019-08-06 MED ORDER — POTASSIUM CHLORIDE CRYS ER 20 MEQ PO TBCR
40.0000 meq | EXTENDED_RELEASE_TABLET | Freq: Once | ORAL | Status: AC
  Administered 2019-08-06: 40 meq via ORAL
  Filled 2019-08-06: qty 2

## 2019-08-06 MED FILL — IMATINIB MESYLATE 100 MG TA: 100 | 30 days supply | Qty: 30 | Fill #0

## 2019-08-06 NOTE — Progress Notes (Signed)
Progress Note  Patient Name: Nicole [name] Date of Encounter: 08/06/2019  Primary Cardiologist: [name], MD  Subjective   States that breathing is less labored today.  No wheezing or coughing.  No palpitations or chest pain.  Inpatient Medications    Scheduled Meds: . apixaban  5 mg Oral BID  . arformoterol  15 mcg Nebulization BID  . atorvastatin  40 mg Oral q1800  . budesonide (PULMICORT) nebulizer solution  0.5 mg Nebulization BID  . busPIRone  10 mg Oral BID  . diltiazem  60 mg Oral Q6H  . docusate sodium  200 mg Oral QHS  . DULoxetine  20 mg Oral Daily  . feeding supplement (ENSURE ENLIVE)  237 mL Oral Q24H  . ferrous sulfate  325 mg Oral Q breakfast  . hydroxyurea  1,000 mg Oral Daily  . mouth rinse  15 mL Mouth Rinse BID  . metoprolol succinate  25 mg Oral Daily  . multivitamin with minerals  1 tablet Oral Daily  . potassium chloride  40 mEq Oral Once    PRN Meds: acetaminophen, albuterol, ALPRAZolam, ondansetron (ZOFRAN) IV, polyethylene glycol, prochlorperazine   Vital Signs    Vitals:   08/06/19 0511 08/06/19 0557 08/06/19 0753 08/06/19 0754  BP: (!) 93/53 (!) 93/53    Pulse: (!) 58     Resp: 20     Temp: 98.6 F (37 C)     TempSrc: Oral     SpO2: 98%  97% 100%  Weight:      Height:        Intake/Output Summary (Last 24 hours) at 08/06/2019 0835 Last data filed at 08/05/2019 1900 Gross per 24 hour  Intake 480 ml  Output 900 ml  Net -420 ml   Filed Weights   08/02/19 0500 08/03/19 0500 08/04/19 0644  Weight: 87 kg 86.4 kg 85.2 kg    Telemetry    Atrial fibrillation with heart rate in the 90s.  Personally reviewed.  Physical Exam   GEN:  Elderly woman.  No acute distress.   Neck: No JVD. Cardiac:  Irregularly irregular, no gallop.  Respiratory: Nonlabored.  Decreased breath sounds without wheezing. GI: Soft, nontender, bowel sounds present. MS: No edema; No deformity. Neuro:  Nonfocal. Psych: Alert and oriented x 3. Normal  affect.  Labs    Chemistry Recent Labs  Lab 08/04/19 0631 08/05/19 0653 08/06/19 0433  NA 142 140 142  K 3.4* 4.0 3.3*  CL 103 103 101  CO2 31 31 32  GLUCOSE 116* 144* 125*  BUN 18 17 21   CREATININE 0.94 0.94 0.92  CALCIUM 8.9 9.1 9.0  GFRNONAA 56* 56* 58*  GFRAA >60 >60 >60  ANIONGAP 8 6 9      Hematology Recent Labs  Lab 08/04/19 0631 08/05/19 0653 08/06/19 0433  WBC 14.1* 16.8* 13.6*  RBC 3.38* 3.46* 3.15*  HGB 8.8* 9.0* 8.2*  HCT 30.9* 31.4* 28.7*  MCV 91.4 90.8 91.1  MCH 26.0 26.0 26.0  MCHC 28.5* 28.7* 28.6*  RDW 17.2* 17.3* 17.5*  PLT 2,069* 2,148* 1,917*    Cardiac Enzymes Recent Labs  Lab 07/27/19 1529 07/27/19 1709 07/29/19 1712 07/29/19 1813  TROPONINIHS 17 18* 68* 70*    BNP Recent Labs  Lab 08/01/19 0528  BNP 737.0*     Radiology    DG CHEST PORT 1 VIEW  Result Date: 08/06/2019 CLINICAL DATA:  Followup pleural effusion EXAM: PORTABLE CHEST 1 VIEW COMPARISON:  08/04/2019 and older exams FINDINGS: Moderate right pleural  effusion obscures the right hemidiaphragm without significant change from the previous exam. Mild vascular interstitial prominence is stable. No new lung abnormalities. Changes from previous cardiac surgery are stable. Cardiac silhouette is enlarged. IMPRESSION: 1. No significant change from the most recent prior study. 2. Moderate right pleural effusion. Vascular congestion and mild interstitial prominence without overt pulmonary edema. Electronically Signed   By: Lajean Manes M.D.   On: 08/06/2019 05:18   DG CHEST PORT 1 VIEW  Result Date: 08/04/2019 CLINICAL DATA:  Shortness of breath. EXAM: PORTABLE CHEST 1 VIEW COMPARISON:  08/02/2019 FINDINGS: Stable enlarged cardiac silhouette, prosthetic aortic and mitral valves, mediastinal surgical clips and left atrial appendage clip. Moderate-sized right pleural effusion with a mild increase in amount. Mild increase in associated right basilar atelectasis or pneumonia. Decreased  prominence of the pulmonary vasculature and interstitial markings. Diffuse osteopenia. IMPRESSION: 1. Mild increase in size of the moderate-sized right pleural effusion and right basilar atelectasis or pneumonia. 2. Stable cardiomegaly with improved pulmonary vascular congestion and interstitial pulmonary edema. Electronically Signed   By: Claudie Revering M.D.   On: 08/04/2019 11:34    Cardiac Studies   Echocardiogram 08/02/2019: 1. Left ventricular ejection fraction, by estimation, is 60 to 65%. The  left ventricle has normal function. The left ventricle has no regional  wall motion abnormalities. There is mild There is basal septal hypertrophy  left ventricular hypertrophy. Left  ventricular diastolic parameters are indeterminate.  2. Right ventricular systolic function is low normal. The right  ventricular size is mildly enlarged. There is moderately elevated  pulmonary artery systolic pressure.  3. Left atrial size was severely dilated.  4. Right atrial size was severely dilated.  5. 26 mm Sorin 3-D Memo Ring is in the MV anular position. Mild mean  gradient across the repaired valve of 6 mmHg. . The mitral valve has been  repaired/replaced. No evidence of mitral valve regurgitation.  6. 21 mm Edwards Magna-ease pericardial valve is in the AV position.  Average mean gradient is measured at 24 mmHg (within range of normal  values for valve) The anatomy of the AV is poorly visualized. . The aortic  valve has been repaired/replaced. Aortic  valve regurgitation is not visualized.   Patient Profile     84 y.o. female with past medical history of permanent atrial fibrillation (failed prior DCCV, developed hyperthyroidism and junctional rhythm with Amiodarone in the past--> rate-control strategy pursued), valvular heart disease (s/p tissue AVR in 2014 with MV ring and maze procedure), chronic diastolic CHF, COPD, hyperthyroidism, HLD and CML with thrombocytosiswho is being seen today for  the evaluation of atrial fibrillation with RVR.  Assessment & Plan    1.  Permanent atrial fibrillation.  Plan to continue heart rate control strategy, somewhat limited in terms of low blood pressures but heart rate is in the 90s this morning.  Plan to convert from short acting Cardizem to Cardizem CD and continue Toprol-XL.  She also continues on Eliquis for stroke prophylaxis.  2.  Chronic diastolic heart failure, LVEF 60 to 65%, also mild RV dysfunction with moderately elevated PASP.  Outpatient regimen included Demadex 80 mg in the morning and 40 mg in the afternoon.  Follow-up chest x-ray today shows moderate sized right pleural effusion with vascular congestion but no frank pulmonary edema.  She has been dosed with IV Lasix during hospital stay.  3.  Transient renal insufficiency, creatinine normal range last 24 to 48 hours, currently 0.92.  4.  CML with thrombocytosis.  Concurrently anemic.  Changing from short acting Cardizem to Cardizem CD 180 mg daily, can uptitrate if tolerated.  Otherwise continue Toprol-XL 25 mg daily.  If low blood pressures remain an issue and further heart rate control is necessary, could consider at least a temporary course of midodrine until things stabilize.  Replete potassium.  Continue IV Lasix, will redose today.  Need to start to mobilize to determine overall heart rate control.  Signed, Rozann Lesches, MD  08/06/2019, 8:35 AM

## 2019-08-06 NOTE — TOC Progression Note (Signed)
Transition of Care [facility]) - Progression Note    Patient Details  Name: Nicole [name] MRN: [id number] Date of Birth: [birth year]  Transition of Care [facility]) CM/SW Contact  Ihor Gully, LCSW Phone Number: 08/06/2019, 11:49 AM  Clinical Narrative:    Message left for Admissions at [facility] advising that per progression rounds patient will receive another unit of blood today and may possibly be ready for discharge 08/07/19.  Expected Discharge Plan: [place] Barriers to Discharge: Continued Medical Work up  Expected Discharge Plan and Services Expected Discharge Plan: [place] arrangements for the past 2 months: Apartment                                       Social Determinants of Health (SDOH) Interventions    Readmission Risk Interventions No flowsheet data found.

## 2019-08-06 NOTE — Telephone Encounter (Signed)
Generic Gleevec (Imatinib) is scheduled to be delivered to [facility] on 08/07/19.  Jene Every, RN, will pick up medication to give to patient before discharging from the hospital into a SNF.   I spoke to [place] (daughter) this morning and she was not able to find any financial information at her mothers home and her mother could not say how much her monthly income was.  She thinks her mother should have a bank statement coming soon so she can get an idea of her income.   Pixley Patient [place] Phone [phone] Fax [phone] 08/06/2019 3:21 PM

## 2019-08-06 NOTE — Telephone Encounter (Signed)
Oral Chemotherapy Pharmacist Encounter  Patient Education I spoke with patient's daughter Nicole [name] for overview of new oral chemotherapy medication: Gleevec (imatinib) for the treatment of CML, planned duration until disease progression or unacceptable toxicity.   Counseled [name] on administration, dosing, side effects, monitoring, drug-food interactions, safe handling, storage, and disposal. Patient will take 1 tablet (100 mg total) by mouth daily. Take with meals and large glass of water.  Side effects include but not limited to: diarrhea, rash, N/V, edema, decreased wbc.    Reviewed with patient importance of keeping a medication schedule and plan for any missed doses.  Nicole [name] voiced understanding and appreciation. All questions answered. Medication handout placed in the mail.  Nicole [name] knows to call the office with questions or concerns. Oral Chemotherapy [facility] will continue to follow.  [name], PharmD, BCPS, BCOP, CPP Hematology/Oncology Clinical Pharmacist [facility] [phone]  08/06/2019 12:37 PM

## 2019-08-06 NOTE — Progress Notes (Signed)
Nutrition Follow-up  DOCUMENTATION CODES:   Obesity unspecified  INTERVENTION:  Continue: Ensure Enlive po BID, each supplement provides 350 kcal and 20 grams of protein   Education provided at initial evaluation   NUTRITION DIAGNOSIS:   Food and nutrition related knowledge deficit related to chronic illness(CHF) as evidenced by per patient/family report(dietary recall high in Na and saturated fats).   GOAL:   Other (Comment)(pt will adhere to nutrition recommendations)  MONITOR:   PO intake, Weight trends, Supplement acceptance, I & O's, Labs  REASON FOR ASSESSMENT:   Consult Assessment of nutrition requirement/status  ASSESSMENT:   84 year old female with past medical history significant of COPD, diastolic CHF, SOB, HLD, atrial fibrillation, CML with thrombocytosis, anxiety, and recently treated for bronchitis/bronchiectasis with 5 day course of doxycycline presented with 1 week history of worsening shortness of breath. CXR showed early pneumonia. Afib with RVR.  Patient is receiving her 2nd unit of blood today. Plans to discharge to SNF when medically cleared.  Meal intake 50-75 % of documented meals. Feeds herself. Ensure Enlive BID - patient likes the oral supplement and reports 100%. Feeds herself. Patient has a hernia which she is self conscious of and says impacts her desire to eat at times.   Labs: BMP Latest Ref Rng & Units 08/06/2019 08/05/2019 08/04/2019  Glucose 70 - 99 mg/dL 125(H) 144(H) 116(H)  BUN 8 - 23 mg/dL 21 17 18   Creatinine 0.44 - 1.00 mg/dL 0.92 0.94 0.94  Sodium 135 - 145 mmol/L 142 140 142  Potassium 3.5 - 5.1 mmol/L 3.3(L) 4.0 3.4(L)  Chloride 98 - 111 mmol/L 101 103 103  CO2 22 - 32 mmol/L 32 31 31  Calcium 8.9 - 10.3 mg/dL 9.0 9.1 8.9     Intake/Output Summary (Last 24 hours) at 08/06/2019 1041 Last data filed at 08/06/2019 0900 Gross per 24 hour  Intake 480 ml  Output 900 ml  Net -420 ml    Admit wt.- 83 kg Currently-85.2  kg  Medications reviewed: lipitor, Colace  Diet Order:   Diet Order            Diet Heart Room service appropriate? Yes; Fluid consistency: Thin; Fluid restriction: 1500 mL Fluid  Diet effective now              EDUCATION NEEDS:   Education needs have been addressed  Skin:  Skin Assessment: Reviewed RN Assessment  Last BM:  3/7 distended abdomen  Height:   Ht Readings from Last 1 Encounters:  07/27/19 5\' 3"  (1.6 m)    Weight:   Wt Readings from Last 1 Encounters:  08/04/19 85.2 kg    Ideal Body Weight:  52.3 kg  BMI:  Body mass index is 33.27 kg/m.  Estimated Nutritional Needs:   Kcal:  1550-1750  Protein:  78-88  Fluid:  1.5 L/day per MD  Colman Cater  Pager # available -Shea Evans

## 2019-08-06 NOTE — Progress Notes (Signed)
PROGRESS NOTE   Chianti Goh  FBP:[id number] DOB: October 31, 1935 DOA: 07/27/2019 PCP: Frances Maywood, FNP   Brief Narrative:  Per HPI: Marco Adelson is a 84 y.o. female with a history of COPD, diastolic heart failure, atrial fibrillation on anticoagulation, CML with thrombocytosis.  She was recently admitted about a month ago for similar complaints.  She presented with Afib and RVR.  She did complete doxycycline for total of 5 days for bronchitis/bronchiectasis.   On day of admission she presented with 1 week of worsening shortness of breath which has been worsening and is now severe.  Symptoms are worse with ambulation and improved with rest.  No other palliating or provoking factors.   She had been prescribed doxycycline and prednisone by her oncology physician, although she has not been get these medications.   Assessment & Plan:   Active Problems:   CAD (coronary artery disease)   Atrial fibrillation (HCC)   Chronic diastolic CHF (congestive heart failure) (HCC)   Aortic stenosis   Thrombocytosis (HCC)   COPD exacerbation (HCC)   CML (chronic myelocytic leukemia) ([place])   HCAP (healthcare-associated pneumonia)   Acute respiratory failure with hypoxia (HCC)   DNR (do not resuscitate)   Pleural effusion   Sepsis ([place])   Acute exacerbation of CHF (congestive heart failure) ([place])  Acute hypoxemic respiratory failure likely secondary to HCAP with some mild pulmonary vascular congestion with acute on chronic diastolic heart failure -completed cefepime.  -bronchodilators as needed.  -Patient is normally on room air at home, wean oxygen as able -Appreciate PCCM ongoing evaluation.  They that her COPD has been progressing.   -Continue IS -IV lasix 30 mg x 1 dose given 08/04/19.  gave additional 30 mg IV lasix 08/05/19.     -cardiology has seen and started IV lasix 40 mg daily.     Atrial fibrillation with RVR -Currently rate controlled and diltiazem is being titrated by cardiology  service -Failed prior DCCV and trial of amiodarone -Appreciate cardiology evaluation with recommendations to start cardizem CD 180 mg daily and toprol XL 25 mg daily. -Continue apixaban for full anticoagulation   AKI on CKD stage 3a -monitoring while being diuresed with IV lasix -Creatinine improved to 0.94 -Monitoring urine output   COPD with mild exacerbation related to above-improving -Steroids held on 2/28 and albuterol scheduled switch to Xopenex as needed   CML with thrombocytosis -Followed by Dr. Delton Coombes outpatient -Hemoglobin 7.6 and will consider transfusion for hemoglobin less than 7 -Discussed with Dr. Halford Chessman, he asked for a hematology consultation given acute decline in condition, patient had not yet started Gleevec.   -Consulted Dr. Delton Coombes, he has started patient on hydrea until she can obtain the [place].  Platelets remain high.  Pt now on 1000 mg of hydroxyurea.  Dr. Raliegh Ip is working with his team to help get the Gleevac for the patient.  I met with Dr. Raliegh Ip today and they should have Gleevac for patient tomorrow and she will able to take with her to SNF and will discontinue hydrea at that time.    Goiter with signs of hyperthyroidism -Previously had been on amiodarone -T4 level slightly elevated with low TSH -We will need endocrinology follow-up outpatient  Anemia in neoplastic disease - pt is s/p 1 unit PRBC on 08/01/19 and Hg improved to 9.0.   Pt is feeling a lot better after the transfusion and seems to be stronger.  After speaking with Dr. Raliegh Ip today he requested 1 additional unit of PRBC today.  DVT prophylaxis: Apixaban Code Status: DNR Family Communication: Discussed with daughter at bedside 08/04/19 Disposition Plan: from home, but too weak and deconditioned to return home, agreeable to SNF, Pt requiring ongoing IV lasix, hematology consulted regarding CML with thrombocytosis and started on hydrea, cardiology working on diltiazem dose titrations which has been  challenging given patient's soft blood pressures, not medically ready to discharge.  Transfusing 1 unit PRBC today.      Consultants:   PCCM  Cardiology   Procedures:   See below  Antimicrobials:  Anti-infectives (From admission, onward)   Start     Dose/Rate Route Frequency Ordered Stop   07/31/19 1800  ceFEPIme (MAXIPIME) 2 g in sodium chloride 0.9 % 100 mL IVPB     2 g 200 mL/hr over 30 Minutes Intravenous  Once 07/31/19 0837 07/31/19 2047   07/31/19 0600  ceFEPIme (MAXIPIME) 2 g in sodium chloride 0.9 % 100 mL IVPB     2 g 200 mL/hr over 30 Minutes Intravenous Every 24 hours 07/30/19 1144 07/31/19 2048   07/28/19 1800  vancomycin (VANCOREADY) IVPB 500 mg/100 mL  Status:  Discontinued     500 mg 100 mL/hr over 60 Minutes Intravenous Every 24 hours 07/27/19 1744 07/29/19 0945   07/27/19 1800  ceFEPIme (MAXIPIME) 2 g in sodium chloride 0.9 % 100 mL IVPB  Status:  Discontinued     2 g 200 mL/hr over 30 Minutes Intravenous Every 12 hours 07/27/19 1714 07/30/19 1144   07/27/19 1800  vancomycin (VANCOREADY) IVPB 1500 mg/300 mL     1,500 mg 150 mL/hr over 120 Minutes Intravenous  Once 07/27/19 1714 07/27/19 2012   07/27/19 1700  vancomycin (VANCOCIN) IVPB 1000 mg/200 mL premix  Status:  Discontinued     1,000 mg 200 mL/hr over 60 Minutes Intravenous  Once 07/27/19 1658 07/27/19 1714   07/27/19 1700  ceFEPIme (MAXIPIME) 2 g in sodium chloride 0.9 % 100 mL IVPB  Status:  Discontinued     2 g 200 mL/hr over 30 Minutes Intravenous  Once 07/27/19 1658 07/27/19 1714      Subjective: Patient reports breathing a little better today.          Objective: Vitals:   08/06/19 1106 08/06/19 1129 08/06/19 1309 08/06/19 1407  BP: 108/68 128/77 121/76 110/78  Pulse: 85 77 78 99  Resp: '18 18 18 18  ' Temp: 98.2 F (36.8 C) 98.6 F (37 C) 98.6 F (37 C) 98.6 F (37 C)  TempSrc: Oral Oral Oral Oral  SpO2: 99% 99% 99% 98%  Weight:      Height:        Intake/Output Summary (Last 24  hours) at 08/06/2019 1657 Last data filed at 08/06/2019 1407 Gross per 24 hour  Intake 1221.25 ml  Output --  Net 1221.25 ml   Filed Weights   08/02/19 0500 08/03/19 0500 08/04/19 0644  Weight: 87 kg 86.4 kg 85.2 kg   Examination:  General exam: very frail and weak, sitting up on side of bed, eating breakfast, alert and oriented today.   Respiratory system: poor air movement, crackles at bases unchanged.  Cardiovascular system: S1 & S2 heard, irregularly irregular. Trace pedal edema.  Gastrointestinal system: Abdomen is nondistended, soft and nontender. No organomegaly or masses felt. Normal bowel sounds heard. Central nervous system: Alert and oriented. No focal neurological deficits. Extremities: trace edema BLEs. Skin: No rashes, lesions or ulcers Psychiatry: Judgement and insight appear normal. Mood & affect appropriate.   Data Reviewed: I  have personally reviewed following labs and imaging studies  CBC: Recent Labs  Lab 08/02/19 0410 08/03/19 0829 08/04/19 0631 08/05/19 0653 08/06/19 0433  WBC 13.1* 15.3* 14.1* 16.8* 13.6*  NEUTROABS  --   --   --   --  11.1*  HGB 8.7* 9.3* 8.8* 9.0* 8.2*  HCT 29.7* 32.1* 30.9* 31.4* 28.7*  MCV 90.3 89.9 91.4 90.8 91.1  PLT 1,869* 2,201* 2,069* 2,148* 1,610*   Basic Metabolic Panel: Recent Labs  Lab 08/02/19 0410 08/03/19 0829 08/04/19 0631 08/05/19 0653 08/06/19 0433  NA 140 139 142 140 142  K 3.9 3.7 3.4* 4.0 3.3*  CL 107 105 103 103 101  CO2 '25 28 31 31 ' 32  GLUCOSE 99 126* 116* 144* 125*  BUN 33* 25* '18 17 21  ' CREATININE 1.22* 1.09* 0.94 0.94 0.92  CALCIUM 8.8* 8.7* 8.9 9.1 9.0  MG 2.7*  --  2.3 2.5* 2.2   GFR: Estimated Creatinine Clearance: 48.7 mL/min (by C-G formula based on SCr of 0.92 mg/dL). Liver Function Tests: No results for input(s): AST, ALT, ALKPHOS, BILITOT, PROT, ALBUMIN in the last 168 hours. No results for input(s): LIPASE, AMYLASE in the last 168 hours. No results for input(s): AMMONIA in the last  168 hours. Coagulation Profile: No results for input(s): INR, PROTIME in the last 168 hours. Cardiac Enzymes: No results for input(s): CKTOTAL, CKMB, CKMBINDEX, TROPONINI in the last 168 hours. BNP (last 3 results) No results for input(s): PROBNP in the last 8760 hours. HbA1C: No results for input(s): HGBA1C in the last 72 hours. CBG: No results for input(s): GLUCAP in the last 168 hours. Lipid Profile: No results for input(s): CHOL, HDL, LDLCALC, TRIG, CHOLHDL, LDLDIRECT in the last 72 hours. Thyroid Function Tests: No results for input(s): TSH, T4TOTAL, FREET4, T3FREE, THYROIDAB in the last 72 hours. Anemia Panel: No results for input(s): VITAMINB12, FOLATE, FERRITIN, TIBC, IRON, RETICCTPCT in the last 72 hours. Sepsis Labs: No results for input(s): PROCALCITON, LATICACIDVEN in the last 168 hours.  Recent Results (from the past 240 hour(s))  Blood Culture (routine x 2)     Status: None   Collection Time: 07/27/19  5:20 PM   Specimen: BLOOD RIGHT HAND  Result Value Ref Range Status   Specimen Description BLOOD RIGHT HAND  Final   Special Requests   Final    BOTTLES DRAWN AEROBIC AND ANAEROBIC Blood Culture adequate volume   Culture   Final    NO GROWTH 5 DAYS Performed at [facility], [address], [place], [place] [address]    Report Status 08/01/2019 FINAL  Final  Blood Culture (routine x 2)     Status: None   Collection Time: 07/27/19  5:20 PM   Specimen: Left Antecubital; Blood  Result Value Ref Range Status   Specimen Description LEFT ANTECUBITAL  Final   Special Requests   Final    BOTTLES DRAWN AEROBIC AND ANAEROBIC Blood Culture adequate volume   Culture   Final    NO GROWTH 5 DAYS Performed at [facility], [address], [place], [place] [address]    Report Status 08/01/2019 FINAL  Final  Urine culture     Status: Abnormal   Collection Time: 07/27/19  6:35 PM   Specimen: In/Out Cath Urine  Result Value Ref Range Status   Specimen Description   Final     IN/OUT CATH URINE Performed at [facility], [address] [place], [place] [address]    Special Requests   Final  NONE Performed at [facility], [address] [place], [place] [address]    Culture 50,000 COLONIES/mL ENTEROCOCCUS FAECALIS (A)  Final   Report Status 07/30/2019 FINAL  Final   Organism ID, Bacteria ENTEROCOCCUS FAECALIS (A)  Final      Susceptibility   Enterococcus faecalis - MIC*    AMPICILLIN <=2 SENSITIVE Sensitive     NITROFURANTOIN <=16 SENSITIVE Sensitive     VANCOMYCIN 2 SENSITIVE Sensitive     * 50,000 COLONIES/mL ENTEROCOCCUS FAECALIS  MRSA PCR Screening     Status: None   Collection Time: 07/29/19  6:03 AM   Specimen: Nasal Mucosa; Nasopharyngeal  Result Value Ref Range Status   MRSA by PCR NEGATIVE NEGATIVE Final    Comment:        The GeneXpert MRSA Assay (FDA approved for NASAL specimens only), is one component of a comprehensive MRSA colonization surveillance program. It is not intended to diagnose MRSA infection nor to guide or monitor treatment for MRSA infections. Performed at [facility], [address], [place], [place] [address]   SARS CORONAVIRUS 2 (TAT 6-24 HRS) Nasopharyngeal Nasopharyngeal Swab     Status: None   Collection Time: 08/01/19  4:45 AM   Specimen: Nasopharyngeal Swab  Result Value Ref Range Status   SARS Coronavirus 2 NEGATIVE NEGATIVE Final    Comment: (NOTE) SARS-CoV-2 target nucleic acids are NOT DETECTED. The SARS-CoV-2 RNA is generally detectable in upper and lower respiratory specimens during the acute phase of infection. Negative results do not preclude SARS-CoV-2 infection, do not rule out co-infections with other pathogens, and should not be used as the sole basis for treatment or other patient management decisions. Negative results must be combined with clinical observations, patient history, and epidemiological information. The expected result is Negative. Fact Sheet for  Patients: SugarRoll.be Fact Sheet for Healthcare Providers: https://www.woods-mathews.com/ This test is not yet approved or cleared by the [place] FDA and  has been authorized for detection and/or diagnosis of SARS-CoV-2 by FDA under an Emergency Use Authorization (EUA). This EUA will remain  in effect (meaning this test can be used) for the duration of the COVID-19 declaration under Section 56 4(b)(1) of the Act, 21 U.S.C. section 360bbb-3(b)(1), unless the authorization is terminated or revoked sooner. Performed at [facility], [place] [address] [place], [place] [address]   SARS CORONAVIRUS 2 (TAT 6-24 HRS) Nasopharyngeal Nasopharyngeal Swab     Status: None   Collection Time: 08/02/19  2:56 PM   Specimen: Nasopharyngeal Swab  Result Value Ref Range Status   SARS Coronavirus 2 NEGATIVE NEGATIVE Final    Comment: (NOTE) SARS-CoV-2 target nucleic acids are NOT DETECTED. The SARS-CoV-2 RNA is generally detectable in upper and lower respiratory specimens during the acute phase of infection. Negative results do not preclude SARS-CoV-2 infection, do not rule out co-infections with other pathogens, and should not be used as the sole basis for treatment or other patient management decisions. Negative results must be combined with clinical observations, patient history, and epidemiological information. The expected result is Negative. Fact Sheet for Patients: SugarRoll.be Fact Sheet for Healthcare Providers: https://www.woods-mathews.com/ This test is not yet approved or cleared by the [place] FDA and  has been authorized for detection and/or diagnosis of SARS-CoV-2 by FDA under an Emergency Use Authorization (EUA). This EUA will remain  in effect (meaning this test can be used) for the duration of the COVID-19 declaration under Section 56 4(b)(1) of the Act, 21 U.S.C. section  360bbb-3(b)(1), unless  the authorization is terminated or revoked sooner. Performed at [facility], [place] [address], [place], [place] [address]   SARS CORONAVIRUS 2 (TAT 6-24 HRS) Nasopharyngeal Nasopharyngeal Swab     Status: None   Collection Time: 08/05/19  5:23 PM   Specimen: Nasopharyngeal Swab  Result Value Ref Range Status   SARS Coronavirus 2 NEGATIVE NEGATIVE Final    Comment: (NOTE) SARS-CoV-2 target nucleic acids are NOT DETECTED. The SARS-CoV-2 RNA is generally detectable in upper and lower respiratory specimens during the acute phase of infection. Negative results do not preclude SARS-CoV-2 infection, do not rule out co-infections with other pathogens, and should not be used as the sole basis for treatment or other patient management decisions. Negative results must be combined with clinical observations, patient history, and epidemiological information. The expected result is Negative. Fact Sheet for Patients: SugarRoll.be Fact Sheet for Healthcare Providers: https://www.woods-mathews.com/ This test is not yet approved or cleared by the [place] FDA and  has been authorized for detection and/or diagnosis of SARS-CoV-2 by FDA under an Emergency Use Authorization (EUA). This EUA will remain  in effect (meaning this test can be used) for the duration of the COVID-19 declaration under Section 56 4(b)(1) of the Act, 21 U.S.C. section 360bbb-3(b)(1), unless the authorization is terminated or revoked sooner. Performed at [facility], [place] [address], [place], [place] [address]    Radiology Studies: DG CHEST PORT 1 VIEW  Result Date: 08/06/2019 CLINICAL DATA:  Followup pleural effusion EXAM: PORTABLE CHEST 1 VIEW COMPARISON:  08/04/2019 and older exams FINDINGS: Moderate right pleural effusion obscures the right hemidiaphragm without significant change from the previous exam. Mild vascular interstitial prominence is  stable. No new lung abnormalities. Changes from previous cardiac surgery are stable. Cardiac silhouette is enlarged. IMPRESSION: 1. No significant change from the most recent prior study. 2. Moderate right pleural effusion. Vascular congestion and mild interstitial prominence without overt pulmonary edema. Electronically Signed   By: Lajean Manes M.D.   On: 08/06/2019 05:18   Scheduled Meds: . apixaban  5 mg Oral BID  . arformoterol  15 mcg Nebulization BID  . atorvastatin  40 mg Oral q1800  . budesonide (PULMICORT) nebulizer solution  0.5 mg Nebulization BID  . busPIRone  10 mg Oral BID  . diltiazem  180 mg Oral Daily  . docusate sodium  200 mg Oral QHS  . DULoxetine  20 mg Oral Daily  . feeding supplement (ENSURE ENLIVE)  237 mL Oral Q24H  . ferrous sulfate  325 mg Oral Q breakfast  . furosemide  40 mg Intravenous Daily  . hydroxyurea  1,000 mg Oral Daily  . mouth rinse  15 mL Mouth Rinse BID  . metoprolol succinate  25 mg Oral Daily  . multivitamin with minerals  1 tablet Oral Daily   Continuous Infusions:    LOS: 10 days   Time spent: 30 minutes  Demone Lyles Wynetta Emery, MD [facility] How to contact the [facility] Attending or Consulting provider [place] or covering provider during after hours [place], for this patient?  1. Check the care team in [facility] and look for a) attending/consulting [facility] provider listed and b) the [facility] team listed 2. Log into [email or web address] and use [place]'s universal password to access. If you do not have the password, please contact the hospital operator. 3. Locate the [facility] provider you are looking for under [facility] and page to a number that you can be directly reached. 4. If  you still have difficulty reaching the provider, please page the [facility] (Director on Call) for the Hospitalists listed on amion for assistance.   If 7PM-7AM, please contact night-coverage [email or web address] Password [facility] 08/06/2019, 4:57 PM

## 2019-08-06 NOTE — Progress Notes (Signed)
Patient's BP 166/135.  Dr. Wynetta Emery notified.  BP rechecked with manual cuff.  BP 106/64.

## 2019-08-06 NOTE — Progress Notes (Signed)
NAME:  Nicole [name], MRN:  [id number], DOB:  11/04/1935, LOS: 10 ADMISSION DATE:  07/27/2019, CONSULTATION DATE:  07/30/2019 REFERRING MD:  Dr. Manuella Ghazi, [facility], CHIEF COMPLAINT:  Short of breath  Brief History   84 yowf quit smoking 6 y PTA presented with worsening dyspnea and cough.  Had recent course of doxycycline and prednisone as outpt prior to admission.  Admitted with recurrent COPD exacerbation and HCAP.  History of present illness   She was in hospital recently with A fib and CHF.  Has noticed more trouble with her breathing since.  She was started on ABx for possible HCAP.  She has been getting diuretics.  She has been using breo.  She feels this helps, but concerned about expense.  Transitioned to room air at rest, but was told by PT that she would need supplemental oxygen with activity.  Leg swelling has improved.  Has cough with beige colored sputum.    Gets intermittent discomfort in abdomen from large ventral hernia, but these symptoms usually don't last long and resolve spontaneously.  Past Medical History  COPD, A fib, CML with thrombocytosis, Chronic diastolic CHF, severe AS and moderate MR s/p bioprosthetic AVR/MV ring/MAZE 2014, Amiodarone induced hyperthyroidism  Significant Hospital Events   2/26 Admit 3/03 transfused 1 unit PRBC  Consults:  Cardiology Hematology/oncology PCCM   Procedures:    Significant Diagnostic Tests:  PFT 10/30/13 >> FEV1 1.34 (68%), ratio 0.73 p 11% response to saba/  TLC 3.60 (73%), DLCO 50%,    Echo 06/25/19 >> EF 55 to [id number], mild RV systolic dysfx, severe LA and RA dilation, s/p MVR, moderately elevated PASP CT angio chest 06/24/19 >> atherosclerosis, clipping of LA, 4.6 x 3.2 Lt thyroid enlargement, tiny pleural effusions, kyphosis, large ventral hernia  Micro Data:  SARS CoV2 PCR 2/26 >> negative Influenza PCR 2/26 >> negative Pneumococcal Ag 2/26 >> negative  Antimicrobials:  Vancomycin 2/26 >> 2/27 Cefepime 2/26 >> 3/02  Scheduled  Meds: . apixaban  5 mg Oral BID  . arformoterol  15 mcg Nebulization BID  . atorvastatin  40 mg Oral q1800  . budesonide (PULMICORT) nebulizer solution  0.5 mg Nebulization BID  . busPIRone  10 mg Oral BID  . diltiazem  180 mg Oral Daily  . docusate sodium  200 mg Oral QHS  . DULoxetine  20 mg Oral Daily  . feeding supplement (ENSURE ENLIVE)  237 mL Oral Q24H  . ferrous sulfate  325 mg Oral Q breakfast  . furosemide  40 mg Intravenous Daily  . hydroxyurea  1,000 mg Oral Daily  . mouth rinse  15 mL Mouth Rinse BID  . metoprolol succinate  25 mg Oral Daily  . multivitamin with minerals  1 tablet Oral Daily   Continuous Infusions: PRN Meds:.acetaminophen, albuterol, ALPRAZolam, ondansetron (ZOFRAN) IV, polyethylene glycol, prochlorperazine   Interim history/subjective:  Comfortable lying flat on NP   Objective   Blood pressure 110/78, pulse 99, temperature 98.6 F (37 C), temperature source Oral, resp. rate 18, height 5\' 3"  (1.6 m), weight 85.2 kg, SpO2 98 %.        Intake/Output Summary (Last 24 hours) at 08/06/2019 1631 Last data filed at 08/06/2019 1407 Gross per 24 hour  Intake 1221.25 ml  Output --  Net 1221.25 ml   Filed Weights   08/02/19 0500 08/03/19 0500 08/04/19 0644  Weight: 87 kg 86.4 kg 85.2 kg    Examination:  Pt alert,   nad @ flat in bed  No  jvd Oropharynx clear,  mucosa nl Neck supple Lungs with distant bs/ somewhat barrel chest  RRR no s3with II/VI sem   Abd obese large ventral hernia   Extr warm with no edema or clubbing noted Neuro    no apparent motor deficits     pCXR  3/8  1. No significant change from the most recent prior study. 2. Moderate right pleural effusion. Vascular congestion and mild interstitial prominence without overt pulmonary edema. My impression:  Very impressive CM/ relatively small effusion      Assessment & Plan:   COPD technically GOLD 0/  positive bronchodilator response suggesting more asthma than copd  -  continue  pulmicort, brovana nebulizer with prn albuterol/ beware cardiac asthma and in particular pleural effusions from chf  have the same effect on insp muscles/mechanics (both shorten their length prior to inspiration making them weaker with less force reserve) so they are synergistic in causing sob.  >>> rx diuresis per cards/pcp   Acute respiratory failure/ hypoxemia borderline hypercabic likely as well  - wob ok at rest/ goal for sats is > 90%   Rt atelectasis and effusion. - bronchial hygiene, diuresis    Restrictive lung disease. - from kyphosis, large ventral hernia, obesity - weight reduction  Permanent A fib s/p MAZE. Acute on chronic diastolic CHF. Valvular heart disease s/p bioprosthetic AVR and mitral annuloplasty. Hx of HLD. - continue apixaban, atorvastatin, diltiazem, toprol XL - cardiology following - continue diuresis  Goiter. - f/u with PCP as outpt  Hx of CML with thrombocytosis. Anemia of chronic disease. - started on hydroxyurea since Gleevac can't be started in hospital - f/u CBC  Deconditioning. - PT/OT - recommending SNF  Goals of care. - DNR/DNI  Resolved problems:  HCAP, AKI from diuresis, COPD exacerbation  Best practice:  Diet: heart healthy DVT prophylaxis: apixiban GI prophylaxis: not indicated Mobility: OOB Code Status: DNR Disposition: telemetry  Labs    CMP Latest Ref Rng & Units 08/06/2019 08/05/2019 08/04/2019  Glucose 70 - 99 mg/dL 125(H) 144(H) 116(H)  BUN 8 - 23 mg/dL 21 17 18   Creatinine 0.44 - 1.00 mg/dL 0.92 0.94 0.94  Sodium 135 - 145 mmol/L 142 140 142  Potassium 3.5 - 5.1 mmol/L 3.3(L) 4.0 3.4(L)  Chloride 98 - 111 mmol/L 101 103 103  CO2 22 - 32 mmol/L 32 31 31  Calcium 8.9 - 10.3 mg/dL 9.0 9.1 8.9  Total Protein 6.5 - 8.1 g/dL - - -  Total Bilirubin 0.3 - 1.2 mg/dL - - -  Alkaline Phos 38 - 126 U/L - - -  AST 15 - 41 U/L - - -  ALT 0 - 44 U/L - - -    CBC Latest Ref Rng & Units 08/06/2019 08/05/2019 08/04/2019    WBC 4.0 - 10.5 K/uL 13.6(H) 16.8(H) 14.1(H)  Hemoglobin 12.0 - 15.0 g/dL 8.2(L) 9.0(L) 8.8(L)  Hematocrit 36.0 - 46.0 % 28.7(L) 31.4(L) 30.9(L)  Platelets 150 - 400 K/uL 1,917(HH) 2,148(HH) 2,069(HH)      Really not much more to offer here - can see prn at your request    Christinia Gully, MD Pulmonary and [place] [phone] After 5:30 PM or weekends, use [facility] [phone]

## 2019-08-06 NOTE — Care Management Important Message (Signed)
Important Message  Patient Details  Name: Nicole [name] MRN: [id number] Date of Birth: 13-Oct-1935   Medicare Important Message Given:  Yes     [name] 08/06/2019, 12:07 PM

## 2019-08-06 NOTE — Consult Note (Signed)
[facility] Oncology Progress Note  Name: Nicole [name]      MRN: [id number]    Location: A311/A311-01  Date: 08/06/2019 Time:7:08 PM   Subjective: Interval History:Nicole [name] seen for follow-up of CML.  She had uneventful weekend.  She reports that she is feeling well in terms of breathing although not completely.  Denies any fevers or chills.  Denies any nausea or vomiting.  Objective: Vital signs in last 24 hours: Temp:  [98.2 F (36.8 C)-98.6 F (37 C)] 98.6 F (37 C) (03/08 1407) Pulse Rate:  [58-112] 99 (03/08 1407) Resp:  [18-20] 18 (03/08 1407) BP: (93-166)/(53-135) 110/78 (03/08 1407) SpO2:  [94 %-100 %] 98 % (03/08 1407)    Intake/Output from previous day: 03/07 0800 - 03/08 0759 In: 480 [P.O.:480] Out: 900 [Urine:900]    Intake/Output this shift: Total I/O In: 1221.3 [P.O.:480; Blood:741.3] Out: 600 [Urine:600]   PHYSICAL EXAM: BP 110/78   Pulse 99   Temp 98.6 F (37 C) (Oral)   Resp 18   Ht 5\' 3"  (1.6 m)   Wt 187 lb 13.3 oz (85.2 kg)   SpO2 98%   BMI 33.27 kg/m  General appearance: alert, cooperative and appears stated age Lungs: Bilateral air entry with occasional crackles. Extremities: Trace edema bilaterally.  No cyanosis. Neurologic: Grossly normal   Studies/Results: Results for orders placed or performed during the hospital encounter of 07/27/19 (from the past 48 hour(s))  CBC     Status: Abnormal   Collection Time: 08/05/19  6:53 AM  Result Value Ref Range   WBC 16.8 (H) 4.0 - 10.5 K/uL    Comment: REPEATED TO VERIFY WHITE COUNT CONFIRMED ON SMEAR    RBC 3.46 (L) 3.87 - 5.11 MIL/uL   Hemoglobin 9.0 (L) 12.0 - 15.0 g/dL   HCT 31.4 (L) 36.0 - 46.0 %   MCV 90.8 80.0 - 100.0 fL   MCH 26.0 26.0 - 34.0 pg   MCHC 28.7 (L) 30.0 - 36.0 g/dL   RDW 17.3 (H) 11.5 - 15.5 %   Platelets 2,148 (HH) 150 - 400 K/uL    Comment: REPEATED TO VERIFY PLATELET COUNT CONFIRMED BY SMEAR THIS CRITICAL RESULT HAS VERIFIED AND BEEN CALLED TO BULL[place] L BY  LATISHA HENDERSON ON 03 07 2021 AT 0756, AND HAS BEEN READ BACK.     nRBC 0.0 0.0 - 0.2 %    Comment: Performed at [facility], [address], [place], [place] [id number]  Basic metabolic panel     Status: Abnormal   Collection Time: 08/05/19  6:53 AM  Result Value Ref Range   Sodium 140 135 - 145 mmol/L   Potassium 4.0 3.5 - 5.1 mmol/L   Chloride 103 98 - 111 mmol/L   CO2 31 22 - 32 mmol/L   Glucose, Bld 144 (H) 70 - 99 mg/dL    Comment: Glucose reference range applies only to samples taken after fasting for at least 8 hours.   BUN 17 8 - 23 mg/dL   Creatinine, Ser 0.94 0.44 - 1.00 mg/dL   Calcium 9.1 8.9 - 10.3 mg/dL   GFR calc non Af Amer 56 (L) >60 mL/min   GFR calc Af Amer >60 >60 mL/min   Anion gap 6 5 - 15    Comment: Performed at [facility], [address], [place], [place] [address]  Magnesium     Status: Abnormal   Collection Time: 08/05/19  6:53 AM  Result Value Ref Range   Magnesium 2.5 (H)  1.7 - 2.4 mg/dL    Comment: Performed at [facility], [address] [place], [place] [address]  SARS CORONAVIRUS 2 (TAT 6-24 HRS) Nasopharyngeal Nasopharyngeal Swab     Status: None   Collection Time: 08/05/19  5:23 PM   Specimen: Nasopharyngeal Swab  Result Value Ref Range   SARS Coronavirus 2 NEGATIVE NEGATIVE    Comment: (NOTE) SARS-CoV-2 target nucleic acids are NOT DETECTED. The SARS-CoV-2 RNA is generally detectable in upper and lower respiratory specimens during the acute phase of infection. Negative results do not preclude SARS-CoV-2 infection, do not rule out co-infections with other pathogens, and should not be used as the sole basis for treatment or other patient management decisions. Negative results must be combined with clinical observations, patient history, and epidemiological information. The expected result is Negative. Fact Sheet for Patients: SugarRoll.be Fact Sheet for Healthcare  Providers: https://www.woods-mathews.com/ This test is not yet approved or cleared by the [place] FDA and  has been authorized for detection and/or diagnosis of SARS-CoV-2 by FDA under an Emergency Use Authorization (EUA). This EUA will remain  in effect (meaning this test can be used) for the duration of the COVID-19 declaration under Section 56 4(b)(1) of the Act, 21 U.S.C. section 360bbb-3(b)(1), unless the authorization is terminated or revoked sooner. Performed at [facility], [place] [address], [place], [place] [address]   CBC with Differential/Platelet     Status: Abnormal   Collection Time: 08/06/19  4:33 AM  Result Value Ref Range   WBC 13.6 (H) 4.0 - 10.5 K/uL   RBC 3.15 (L) 3.87 - 5.11 MIL/uL   Hemoglobin 8.2 (L) 12.0 - 15.0 g/dL   HCT 28.7 (L) 36.0 - 46.0 %   MCV 91.1 80.0 - 100.0 fL   MCH 26.0 26.0 - 34.0 pg   MCHC 28.6 (L) 30.0 - 36.0 g/dL   RDW 17.5 (H) 11.5 - 15.5 %   Platelets 1,917 (HH) 150 - 400 K/uL    Comment: REPEATED TO VERIFY SPECIMEN CHECKED FOR CLOTS CONSISTENT WITH PREVIOUS RESULT THIS CRITICAL RESULT HAS VERIFIED AND BEEN CALLED TO RENEE B.,RN BY KATTIE YANCEY ON 03 08 2021 AT 0612, AND HAS BEEN READ BACK.     nRBC 0.0 0.0 - 0.2 %   Neutrophils Relative % 82 %   Neutro Abs 11.1 (H) 1.7 - 7.7 K/uL   Lymphocytes Relative 7 %   Lymphs Abs 0.9 0.7 - 4.0 K/uL   Monocytes Relative 7 %   Monocytes Absolute 1.0 0.1 - 1.0 K/uL   Eosinophils Relative 1 %   Eosinophils Absolute 0.2 0.0 - 0.5 K/uL   Basophils Relative 1 %   Basophils Absolute 0.2 (H) 0.0 - 0.1 K/uL   Immature Granulocytes 2 %   Abs Immature Granulocytes 0.23 (H) 0.00 - 0.07 K/uL    Comment: Performed at [facility], [address], [place], [place] [id number]  Basic metabolic panel     Status: Abnormal   Collection Time: 08/06/19  4:33 AM  Result Value Ref Range   Sodium 142 135 - 145 mmol/L   Potassium 3.3 (L) 3.5 - 5.1 mmol/L    Comment: DELTA CHECK NOTED    Chloride 101 98 - 111 mmol/L   CO2 32 22 - 32 mmol/L   Glucose, Bld 125 (H) 70 - 99 mg/dL    Comment: Glucose reference range applies only to samples taken after fasting for at least 8 hours.   BUN 21 8 - 23 mg/dL  Creatinine, Ser 0.92 0.44 - 1.00 mg/dL   Calcium 9.0 8.9 - 10.3 mg/dL   GFR calc non Af Amer 58 (L) >60 mL/min   GFR calc Af Amer >60 >60 mL/min   Anion gap 9 5 - 15    Comment: Performed at [facility], [address] [place], [place] [address]  Magnesium     Status: None   Collection Time: 08/06/19  4:33 AM  Result Value Ref Range   Magnesium 2.2 1.7 - 2.4 mg/dL    Comment: Performed at [facility], [address] [place], [place] [address]  Type and screen [facility]     Status: None (Preliminary result)   Collection Time: 08/06/19  9:40 AM  Result Value Ref Range   ABO/RH(D) A POS    Antibody Screen NEG    Sample Expiration 08/09/2019,2359    Unit Number [id number]    Blood Component Type RED CELLS,LR    Unit division 00    Status of Unit ISSUED    Transfusion Status OK TO TRANSFUSE    Crossmatch Result      Compatible Performed at [facility], [address], [place], [place] [address]   Prepare RBC     Status: None   Collection Time: 08/06/19  9:42 AM  Result Value Ref Range   Order Confirmation      ORDER PROCESSED BY BLOOD BANK Performed at [facility], [address], [place], [place] [address]    DG CHEST PORT 1 VIEW  Result Date: 08/06/2019 CLINICAL DATA:  Followup pleural effusion EXAM: PORTABLE CHEST 1 VIEW COMPARISON:  08/04/2019 and older exams FINDINGS: Moderate right pleural effusion obscures the right hemidiaphragm without significant change from the previous exam. Mild vascular interstitial prominence is stable. No new lung abnormalities. Changes from previous cardiac surgery are stable. Cardiac silhouette is enlarged. IMPRESSION: 1. No significant change from the most recent prior study. 2. Moderate right pleural effusion. Vascular  congestion and mild interstitial prominence without overt pulmonary edema. Electronically Signed   By: Lajean Manes M.D.   On: 08/06/2019 05:18     MEDICATIONS: I have reviewed the patient's current medications.     Assessment/Plan:  1.  CML: -She is on hydroxyurea 1 g daily. -CBC today shows platelet count 1917.  Hemoglobin is 8.2.  White count is 13.6. -She did receive 1 unit of PRBC today. -We were able to get her Gleevec.  She will receive the medication tomorrow.  She will take her first pill tomorrow. -She will start 100 mg daily.  We will titrate up as tolerated once I see her in the office. -She will use Compazine as needed if she develops nausea. -Please discontinue hydroxyurea after tonight's dose. -I will see her back in 7 to 10 days after start of [place].  2.  Atrial fibrillation: -Status post maze.  She also has chronic diastolic CHF. -Valvular heart disease, status post bioprosthetic AVR and mitral annuloplasty. -She is on apixaban, atorvastatin, diltiazem, Toprol-XL.  Lasix as needed.  3.  COPD: -Continue Pulmicort, Brovana nebulizer with as needed albuterol. -Chest x-ray today reviewed by me showed moderate right pleural effusion.  Vascular congestion with mild interstitial prominence without overt pulmonary edema.  All questions were answered. The patient knows to call the clinic with any problems, questions or concerns. We can certainly see the patient much sooner if necessary.     Derek Jack

## 2019-08-07 ENCOUNTER — Ambulatory Visit (HOSPITAL_COMMUNITY): Payer: Medicare Other | Admitting: Hematology

## 2019-08-07 ENCOUNTER — Encounter (HOSPITAL_COMMUNITY): Payer: Medicare Other | Admitting: Cardiology

## 2019-08-07 LAB — BASIC METABOLIC PANEL
Anion gap: 10 (ref 5–15)
BUN: 20 mg/dL (ref 8–23)
CO2: 30 mmol/L (ref 22–32)
Calcium: 9 mg/dL (ref 8.9–10.3)
Chloride: 100 mmol/L (ref 98–111)
Creatinine, Ser: 0.91 mg/dL (ref 0.44–1.00)
GFR calc Af Amer: >60 mL/min (ref >60)
GFR calc non Af Amer: 59 mL/min — ABNORMAL LOW (ref >60)
Glucose, Bld: 147 mg/dL — ABNORMAL HIGH (ref 70–99)
Potassium: 3.7 mmol/L (ref 3.5–5.1)
Sodium: 140 mmol/L (ref 135–145)

## 2019-08-07 LAB — BPAM RBC
Blood Product Expiration Date: 202103302359
ISSUE DATE / TIME: 202103081109
Unit Type and Rh: 6200

## 2019-08-07 LAB — CBC
HCT: 32.1 % — ABNORMAL LOW (ref 36.0–46.0)
Hemoglobin: 9.5 g/dL — ABNORMAL LOW (ref 12.0–15.0)
MCH: 26.7 pg (ref 26.0–34.0)
MCHC: 29.6 g/dL — ABNORMAL LOW (ref 30.0–36.0)
MCV: 90.2 fL (ref 80.0–100.0)
Platelets: 1744 10*3/uL (ref 150–400)
RBC: 3.56 MIL/uL — ABNORMAL LOW (ref 3.87–5.11)
RDW: 17.5 % — ABNORMAL HIGH (ref 11.5–15.5)
WBC: 12.9 10*3/uL — ABNORMAL HIGH (ref 4.0–10.5)
nRBC: 0 % (ref 0.0–0.2)

## 2019-08-07 LAB — TYPE AND SCREEN
ABO/RH(D): A POS
Antibody Screen: NEGATIVE
Unit division: 0

## 2019-08-07 LAB — MAGNESIUM: Magnesium: 2.3 mg/dL (ref 1.7–2.4)

## 2019-08-07 MED ORDER — IMATINIB MESYLATE 100 MG PO TABS
100.0000 mg | ORAL_TABLET | Freq: Every day | ORAL | Status: DC
  Administered 2019-08-07 – 2019-08-08 (×2): 100 mg via ORAL

## 2019-08-07 MED ORDER — POTASSIUM CHLORIDE CRYS ER 20 MEQ PO TBCR
40.0000 meq | EXTENDED_RELEASE_TABLET | Freq: Once | ORAL | Status: AC
  Administered 2019-08-07: 40 meq via ORAL
  Filled 2019-08-07: qty 2

## 2019-08-07 NOTE — Progress Notes (Signed)
PROGRESS NOTE   Nicole [name]  [id number] DOB: [birth year] DOA: 07/27/2019 PCP: Frances Maywood, FNP   Brief Narrative:  Per HPI: Nicole [name] is a 84 y.o. female with a history of COPD, diastolic heart failure, atrial fibrillation on anticoagulation, CML with thrombocytosis.  She was recently admitted about a month ago for similar complaints.  She presented with Afib and RVR.  She did complete doxycycline for total of 5 days for bronchitis/bronchiectasis.   On day of admission she presented with 1 week of worsening shortness of breath which has been worsening and is now severe.  Symptoms are worse with ambulation and improved with rest.  No other palliating or provoking factors.   She had been prescribed doxycycline and prednisone by her oncology physician, although she has not been get these medications.   Assessment & Plan:   Active Problems:   CAD (coronary artery disease)   Atrial fibrillation (HCC)   Chronic diastolic CHF (congestive heart failure) (HCC)   Aortic stenosis   Thrombocytosis (HCC)   COPD exacerbation (HCC)   CML (chronic myelocytic leukemia) ([place])   HCAP (healthcare-associated pneumonia)   Acute respiratory failure with hypoxia (HCC)   DNR (do not resuscitate)   Pleural effusion   Sepsis ([place])   Acute exacerbation of CHF (congestive heart failure) ([place])  Acute hypoxemic respiratory failure likely secondary to HCAP with some mild pulmonary vascular congestion with acute on chronic diastolic heart failure -completed cefepime.  -bronchodilators as needed.  -Patient is normally on room air at home, wean oxygen as able -Appreciate PCCM ongoing evaluation.  They have signed off with nothing further to offer.   -Continue IS -IV lasix 30 mg x 1 dose given 08/04/19.  gave additional 30 mg IV lasix 08/05/19.     -cardiology has seen and started IV lasix 40 mg daily.     Atrial fibrillation with RVR -Currently rate controlled and diltiazem is being titrated by cardiology  service -Failed prior DCCV and trial of amiodarone -Appreciate cardiology evaluation with recommendations to start cardizem CD 180 mg daily and toprol XL 25 mg daily. -Continue apixaban for full anticoagulation   AKI on CKD stage 3a -monitoring while being diuresed with IV lasix -Creatinine improved to 0.94 -Monitoring urine output  Intake/Output Summary (Last 24 hours) at 08/07/2019 1741 Last data filed at 08/07/2019 1300 Gross per 24 hour  Intake 720 ml  Output 600 ml  Net 120 ml     COPD with mild exacerbation related to above-improved   CML with thrombocytosis -Followed by Dr. Delton Coombes outpatient -Hemoglobin 7.6 and will consider transfusion for hemoglobin less than 7 -Discussed with Dr. Halford Chessman, he asked for a hematology consultation given acute decline in condition, patient had not yet started Gleevec.   -Consulted Dr. Delton Coombes, he has started patient on hydrea until she can obtain the [place].  Platelets remain high.  Pt was on 1000 mg of hydroxyurea which has been discontinued after last dose given on 3/8.  Dr. Raliegh Ip is working with his team to help get the [place] for the patient.  Gleevec has been delivered 3/9 to the pharmacy.  Pt to receive first dose 100 mg today 3/9 and we will monitor overnight for adverse reaction.  If tolerates, likely could DC to SNF 3/10.  Pt is to take [place] supply with her to the SNF as this medication is very expensive and the SNF would not be able to obtain the medication.  Pt will follow up with Dr. Delton Coombes in 7-10  days after the start of [place].      Goiter with signs of hyperthyroidism -Previously had been on amiodarone -T4 level slightly elevated with low TSH -Pt will need endocrinology follow-up outpatient  Anemia in neoplastic disease - pt is s/p 1 unit PRBC on 08/01/19.   Pt felt a lot better after the transfusion and seems to be stronger.  After speaking with Dr. Raliegh Ip today he requested 1 additional unit of PRBC to be given 08/06/19 and Hg has  improved to 9.5.       DVT prophylaxis: Apixaban Code Status: DNR Family Communication: Discussed with daughter at bedside 08/04/19, attempted to call daughter 3/8, no answer Disposition Plan: from home, but too weak and deconditioned to return home, agreeable to SNF, Pt requiring ongoing IV lasix, hematology consulted regarding CML with thrombocytosis and started on hydrea until [place] is available,  Pt has received Gleevec on 08/07/19, she is receiving first dose on 3/9, we will monitor for adverse side effects overnight, if she tolerates, she likely could DC to SNF on 3/10, cardiology working on diltiazem dose titrations which has been challenging given patient's soft blood pressures and diuresing with IV lasix.      Consultants:   PCCM  Cardiology   Procedures:   See below  Antimicrobials:  Anti-infectives (From admission, onward)   Start     Dose/Rate Route Frequency Ordered Stop   07/31/19 1800  ceFEPIme (MAXIPIME) 2 g in sodium chloride 0.9 % 100 mL IVPB     2 g 200 mL/hr over 30 Minutes Intravenous  Once 07/31/19 0837 07/31/19 2047   07/31/19 0600  ceFEPIme (MAXIPIME) 2 g in sodium chloride 0.9 % 100 mL IVPB     2 g 200 mL/hr over 30 Minutes Intravenous Every 24 hours 07/30/19 1144 07/31/19 2048   07/28/19 1800  vancomycin (VANCOREADY) IVPB 500 mg/100 mL  Status:  Discontinued     500 mg 100 mL/hr over 60 Minutes Intravenous Every 24 hours 07/27/19 1744 07/29/19 0945   07/27/19 1800  ceFEPIme (MAXIPIME) 2 g in sodium chloride 0.9 % 100 mL IVPB  Status:  Discontinued     2 g 200 mL/hr over 30 Minutes Intravenous Every 12 hours 07/27/19 1714 07/30/19 1144   07/27/19 1800  vancomycin (VANCOREADY) IVPB 1500 mg/300 mL     1,500 mg 150 mL/hr over 120 Minutes Intravenous  Once 07/27/19 1714 07/27/19 2012   07/27/19 1700  vancomycin (VANCOCIN) IVPB 1000 mg/200 mL premix  Status:  Discontinued     1,000 mg 200 mL/hr over 60 Minutes Intravenous  Once 07/27/19 1658 07/27/19 1714    07/27/19 1700  ceFEPIme (MAXIPIME) 2 g in sodium chloride 0.9 % 100 mL IVPB  Status:  Discontinued     2 g 200 mL/hr over 30 Minutes Intravenous  Once 07/27/19 1658 07/27/19 1714      Subjective: Patient says her oxygen came off in her sleep and she had a panic episode, she felt better when staff was able to get her oxygen back on.          Objective: Vitals:   08/07/19 0500 08/07/19 0535 08/07/19 0620 08/07/19 0825  BP:  (!) 94/57  117/68  Pulse:  60  92  Resp:  18    Temp:  98.7 F (37.1 C)    TempSrc:      SpO2:  98% 98%   Weight: 87.3 kg     Height:        Intake/Output Summary (  Last 24 hours) at 08/07/2019 1740 Last data filed at 08/07/2019 1300 Gross per 24 hour  Intake 720 ml  Output 600 ml  Net 120 ml   Filed Weights   08/03/19 0500 08/04/19 0644 08/07/19 0500  Weight: 86.4 kg 85.2 kg 87.3 kg   Examination:  General exam: very frail and weak, sitting up on side of bed, eating breakfast, alert and oriented today.   Respiratory system: poor air movement shallow breathing, fine crackles at bases unchanged.  Cardiovascular system: S1 & S2 heard, irregularly irregular. Trace pedal edema.  Gastrointestinal system: Abdomen is nondistended, soft and nontender. No organomegaly or masses felt. Normal bowel sounds heard. Central nervous system: Alert and oriented. No focal neurological deficits. Extremities: trace edema BLEs. Skin: No rashes, lesions or ulcers Psychiatry: Judgement and insight appear normal. Mood & affect appropriate.   Data Reviewed: I have personally reviewed following labs and imaging studies  CBC: Recent Labs  Lab 08/03/19 0829 08/04/19 0631 08/05/19 0653 08/06/19 0433 08/07/19 0514  WBC 15.3* 14.1* 16.8* 13.6* 12.9*  NEUTROABS  --   --   --  11.1*  --   HGB 9.3* 8.8* 9.0* 8.2* 9.5*  HCT 32.1* 30.9* 31.4* 28.7* 32.1*  MCV 89.9 91.4 90.8 91.1 90.2  PLT 2,201* 2,069* 2,148* 1,917* Q000111Q*   Basic Metabolic Panel: Recent Labs  Lab  08/02/19 0410 08/02/19 0410 08/03/19 0829 08/04/19 0631 08/05/19 0653 08/06/19 0433 08/07/19 0514  NA 140   < > 139 142 140 142 140  K 3.9   < > 3.7 3.4* 4.0 3.3* 3.7  CL 107   < > 105 103 103 101 100  CO2 25   < > 28 31 31  32 30  GLUCOSE 99   < > 126* 116* 144* 125* 147*  BUN 33*   < > 25* 18 17 21 20   CREATININE 1.22*   < > 1.09* 0.94 0.94 0.92 0.91  CALCIUM 8.8*   < > 8.7* 8.9 9.1 9.0 9.0  MG 2.7*  --   --  2.3 2.5* 2.2 2.3   < > = values in this interval not displayed.   GFR: Estimated Creatinine Clearance: 50 mL/min (by C-G formula based on SCr of 0.91 mg/dL). Liver Function Tests: No results for input(s): AST, ALT, ALKPHOS, BILITOT, PROT, ALBUMIN in the last 168 hours. No results for input(s): LIPASE, AMYLASE in the last 168 hours. No results for input(s): AMMONIA in the last 168 hours. Coagulation Profile: No results for input(s): INR, PROTIME in the last 168 hours. Cardiac Enzymes: No results for input(s): CKTOTAL, CKMB, CKMBINDEX, TROPONINI in the last 168 hours. BNP (last 3 results) No results for input(s): PROBNP in the last 8760 hours. HbA1C: No results for input(s): HGBA1C in the last 72 hours. CBG: No results for input(s): GLUCAP in the last 168 hours. Lipid Profile: No results for input(s): CHOL, HDL, LDLCALC, TRIG, CHOLHDL, LDLDIRECT in the last 72 hours. Thyroid Function Tests: No results for input(s): TSH, T4TOTAL, FREET4, T3FREE, THYROIDAB in the last 72 hours. Anemia Panel: No results for input(s): VITAMINB12, FOLATE, FERRITIN, TIBC, IRON, RETICCTPCT in the last 72 hours. Sepsis Labs: No results for input(s): PROCALCITON, LATICACIDVEN in the last 168 hours.  Recent Results (from the past 240 hour(s))  MRSA PCR Screening     Status: None   Collection Time: 07/29/19  6:03 AM   Specimen: Nasal Mucosa; Nasopharyngeal  Result Value Ref Range Status   MRSA by PCR NEGATIVE NEGATIVE Final  Comment:        The GeneXpert MRSA Assay (FDA approved for  NASAL specimens only), is one component of a comprehensive MRSA colonization surveillance program. It is not intended to diagnose MRSA infection nor to guide or monitor treatment for MRSA infections. Performed at [facility], [address] [place], [place] [address]   SARS CORONAVIRUS 2 (TAT 6-24 HRS) Nasopharyngeal Nasopharyngeal Swab     Status: None   Collection Time: 08/01/19  4:45 AM   Specimen: Nasopharyngeal Swab  Result Value Ref Range Status   SARS Coronavirus 2 NEGATIVE NEGATIVE Final    Comment: (NOTE) SARS-CoV-2 target nucleic acids are NOT DETECTED. The SARS-CoV-2 RNA is generally detectable in upper and lower respiratory specimens during the acute phase of infection. Negative results do not preclude SARS-CoV-2 infection, do not rule out co-infections with other pathogens, and should not be used as the sole basis for treatment or other patient management decisions. Negative results must be combined with clinical observations, patient history, and epidemiological information. The expected result is Negative. Fact Sheet for Patients: SugarRoll.be Fact Sheet for Healthcare Providers: https://www.woods-mathews.com/ This test is not yet approved or cleared by the [place] FDA and  has been authorized for detection and/or diagnosis of SARS-CoV-2 by FDA under an Emergency Use Authorization (EUA). This EUA will remain  in effect (meaning this test can be used) for the duration of the COVID-19 declaration under Section 56 4(b)(1) of the Act, 21 U.S.C. section 360bbb-3(b)(1), unless the authorization is terminated or revoked sooner. Performed at [facility], [place] [address], [place], [place] [address]   SARS CORONAVIRUS 2 (TAT 6-24 HRS) Nasopharyngeal Nasopharyngeal Swab     Status: None   Collection Time: 08/02/19  2:56 PM   Specimen: Nasopharyngeal Swab  Result Value Ref Range Status   SARS Coronavirus 2 NEGATIVE  NEGATIVE Final    Comment: (NOTE) SARS-CoV-2 target nucleic acids are NOT DETECTED. The SARS-CoV-2 RNA is generally detectable in upper and lower respiratory specimens during the acute phase of infection. Negative results do not preclude SARS-CoV-2 infection, do not rule out co-infections with other pathogens, and should not be used as the sole basis for treatment or other patient management decisions. Negative results must be combined with clinical observations, patient history, and epidemiological information. The expected result is Negative. Fact Sheet for Patients: SugarRoll.be Fact Sheet for Healthcare Providers: https://www.woods-mathews.com/ This test is not yet approved or cleared by the [place] FDA and  has been authorized for detection and/or diagnosis of SARS-CoV-2 by FDA under an Emergency Use Authorization (EUA). This EUA will remain  in effect (meaning this test can be used) for the duration of the COVID-19 declaration under Section 56 4(b)(1) of the Act, 21 U.S.C. section 360bbb-3(b)(1), unless the authorization is terminated or revoked sooner. Performed at [facility], [place] [address] [place], [place] [address]   SARS CORONAVIRUS 2 (TAT 6-24 HRS) Nasopharyngeal Nasopharyngeal Swab     Status: None   Collection Time: 08/05/19  5:23 PM   Specimen: Nasopharyngeal Swab  Result Value Ref Range Status   SARS Coronavirus 2 NEGATIVE NEGATIVE Final    Comment: (NOTE) SARS-CoV-2 target nucleic acids are NOT DETECTED. The SARS-CoV-2 RNA is generally detectable in upper and lower respiratory specimens during the acute phase of infection. Negative results do not preclude SARS-CoV-2 infection, do not rule out co-infections with other pathogens, and should not be used as the sole basis for treatment or other patient management decisions. Negative  results must be combined with clinical observations, patient history, and  epidemiological information. The expected result is Negative. Fact Sheet for Patients: SugarRoll.be Fact Sheet for Healthcare Providers: https://www.woods-mathews.com/ This test is not yet approved or cleared by the [place] FDA and  has been authorized for detection and/or diagnosis of SARS-CoV-2 by FDA under an Emergency Use Authorization (EUA). This EUA will remain  in effect (meaning this test can be used) for the duration of the COVID-19 declaration under Section 56 4(b)(1) of the Act, 21 U.S.C. section 360bbb-3(b)(1), unless the authorization is terminated or revoked sooner. Performed at [facility], [place] [address] [place], [place] [address]    Radiology Studies: DG CHEST PORT 1 VIEW  Result Date: 08/06/2019 CLINICAL DATA:  Followup pleural effusion EXAM: PORTABLE CHEST 1 VIEW COMPARISON:  08/04/2019 and older exams FINDINGS: Moderate right pleural effusion obscures the right hemidiaphragm without significant change from the previous exam. Mild vascular interstitial prominence is stable. No new lung abnormalities. Changes from previous cardiac surgery are stable. Cardiac silhouette is enlarged. IMPRESSION: 1. No significant change from the most recent prior study. 2. Moderate right pleural effusion. Vascular congestion and mild interstitial prominence without overt pulmonary edema. Electronically Signed   By: Lajean Manes M.D.   On: 08/06/2019 05:18   Scheduled Meds: . apixaban  5 mg Oral BID  . arformoterol  15 mcg Nebulization BID  . atorvastatin  40 mg Oral q1800  . budesonide (PULMICORT) nebulizer solution  0.5 mg Nebulization BID  . busPIRone  10 mg Oral BID  . diltiazem  180 mg Oral Daily  . docusate sodium  200 mg Oral QHS  . DULoxetine  20 mg Oral Daily  . feeding supplement (ENSURE ENLIVE)  237 mL Oral Q24H  . ferrous sulfate  325 mg Oral Q breakfast  . furosemide  40 mg Intravenous Daily  . imatinib  100 mg Oral Q  breakfast  . mouth rinse  15 mL Mouth Rinse BID  . metoprolol succinate  25 mg Oral Daily  . multivitamin with minerals  1 tablet Oral Daily   Continuous Infusions:   LOS: 11 days   Time spent: 30 minutes  Nicco Reaume Wynetta Emery, MD [facility] How to contact the [facility] Attending or Consulting provider [place] or covering provider during after hours [place], for this patient?  1. Check the care team in [facility] and look for a) attending/consulting TRH provider listed and b) the [facility] team listed 2. Log into [email or web address] and use [place]'s universal password to access. If you do not have the password, please contact the hospital operator. 3. Locate the [facility] provider you are looking for under [facility] and page to a number that you can be directly reached. 4. If you still have difficulty reaching the provider, please page the [facility] (Director on Call) for the Hospitalists listed on amion for assistance.   If 7PM-7AM, please contact night-coverage [email or web address] Password [facility] 08/07/2019, 5:40 PM

## 2019-08-07 NOTE — Progress Notes (Signed)
Physical Therapy Treatment Patient Details Name: Nicole [name] MRN: [id number] DOB: [birth year] Today's Date: 08/07/2019    History of Present Illness Nicole [name] is a 84 y.o. female with a history of COPD, diastolic heart failure, atrial fibrillation on anticoagulation, CML with thrombocytosis.  She was recently admitted about a month ago for similar complaints.  She DU with A. fib and RVR.  She did complete doxycycline for total of 5 days for bronchitis/bronchiectasis.  Today, she presents with 1 week of worsening shortness of breath which has been worsening and is now severe.  Symptoms are worse with ambulation and improved with rest.  No other palliating or provoking factors.  Have mild cough.  She had been prescribed doxycycline and prednisone by her oncology physician, although she has not been get these medications.    PT Comments    Patient's O2 sat was monitored throughout session today and was 96% at 4L at rest which decreased to 90-92% with ambulation. Patient able to ambulate increased distance today but continued to become SOB and required frequent rest breaks. She ambulates with very slow, labored cadence. She is able to complete exercises while seated in chair. Patient left in chair at end of session with nursing present. Patient will benefit from continued physical therapy in hospital and recommended venue below to increase strength, balance, endurance for safe ADLs and gait.   Follow Up Recommendations  SNF;Supervision for mobility/OOB;Supervision - Intermittent     Equipment Recommendations  None recommended by PT    Recommendations for Other Services       Precautions / Restrictions Precautions Precautions: Fall Restrictions Weight Bearing Restrictions: No    Mobility  Bed Mobility               General bed mobility comments: seated in chair at end of session  Transfers Overall transfer level: Needs assistance Equipment used: Rolling walker (2 wheeled)   Sit  to Stand: Min assist Stand pivot transfers: Min assist       General transfer comment: slow labored movement  Ambulation/Gait Ambulation/Gait assistance: Min assist Gait Distance (Feet): 25 Feet Assistive device: Rolling walker (2 wheeled) Gait Pattern/deviations: Decreased step length - right;Decreased step length - left;Decreased stride length Gait velocity: decreased   General Gait Details: very slow, labored cadence with occasional standing rest breaks due to difficulty breathing, Sp O2 range 96% on 4L nasal cannula at rest, decreases to 90-92% with ambulation, limited secondary to c/o fatigue, patient very SOB throughout ambulation   Stairs             Wheelchair Mobility    Modified Rankin (Stroke Patients Only)       Balance Overall balance assessment: Needs assistance Sitting-balance support: Feet supported;No upper extremity supported Sitting balance-Leahy Scale: Good Sitting balance - Comments: seated at EOB   Standing balance support: During functional activity;Bilateral upper extremity supported Standing balance-Leahy Scale: Fair Standing balance comment: using RW                            Cognition Arousal/Alertness: Awake/alert Behavior During Therapy: WFL for tasks assessed/performed Overall Cognitive Status: Within Functional Limits for tasks assessed                                        Exercises General Exercises - Lower Extremity Ankle Circles/Pumps: AROM;Both;20 reps;Seated Long Arc Quad: AROM;Both;20  reps;Seated Hip Flexion/Marching: AROM;Both;20 reps;Seated Toe Raises: AROM;Both;Seated Heel Raises: AROM;Both;20 reps;Seated    General Comments        Pertinent Vitals/Pain Pain Assessment: No/denies pain    Home Living                      Prior Function            PT Goals (current goals can now be found in the care plan section) Acute Rehab PT Goals Patient Stated Goal: return home  with friends/family to assist PT Goal Formulation: With patient Time For Goal Achievement: 08/13/19 Potential to Achieve Goals: Good Progress towards PT goals: Progressing toward goals    Frequency    Min 3X/week      PT Plan Current plan remains appropriate    Co-evaluation              AM-PAC PT "6 Clicks" Mobility   Outcome Measure  Help needed turning from your back to your side while in a flat bed without using bedrails?: None Help needed moving from lying on your back to sitting on the side of a flat bed without using bedrails?: A Little Help needed moving to and from a bed to a chair (including a wheelchair)?: A Little Help needed standing up from a chair using your arms (e.g., wheelchair or bedside chair)?: A Little Help needed to walk in hospital room?: A Little Help needed climbing 3-5 steps with a railing? : A Lot 6 Click Score: 18    End of Session Equipment Utilized During Treatment: Oxygen;Gait belt Activity Tolerance: Patient tolerated treatment well;Patient limited by fatigue Patient left: in chair;with call bell/phone within reach Nurse Communication: Mobility status PT Visit Diagnosis: Unsteadiness on feet (R26.81);Other abnormalities of gait and mobility (R26.89);Muscle weakness (generalized) (M62.81)     Time: [id number] PT Time Calculation (min) (ACUTE ONLY): 24 min  Charges:  $Therapeutic Exercise: 8-22 mins $Therapeutic Activity: 8-22 mins                     12:05 PM, 08/07/19 Mearl Latin PT, DPT Physical Therapist at [facility]

## 2019-08-07 NOTE — Progress Notes (Addendum)
Progress Note  Patient Name: Nicole [name] Date of Encounter: 08/07/2019  Primary Cardiologist: [name], MD   Subjective   Had episodes of PND overnight around 0300. Denies any chest pain or palpitations. Feels weak. The longest distance she has walked since admission is to the restroom.   Inpatient Medications    Scheduled Meds: . apixaban  5 mg Oral BID  . arformoterol  15 mcg Nebulization BID  . atorvastatin  40 mg Oral q1800  . budesonide (PULMICORT) nebulizer solution  0.5 mg Nebulization BID  . busPIRone  10 mg Oral BID  . diltiazem  180 mg Oral Daily  . docusate sodium  200 mg Oral QHS  . DULoxetine  20 mg Oral Daily  . feeding supplement (ENSURE ENLIVE)  237 mL Oral Q24H  . ferrous sulfate  325 mg Oral Q breakfast  . furosemide  40 mg Intravenous Daily  . mouth rinse  15 mL Mouth Rinse BID  . metoprolol succinate  25 mg Oral Daily  . multivitamin with minerals  1 tablet Oral Daily    PRN Meds: acetaminophen, albuterol, ALPRAZolam, ondansetron (ZOFRAN) IV, polyethylene glycol, prochlorperazine   Vital Signs    Vitals:   08/07/19 0413 08/07/19 0500 08/07/19 0535 08/07/19 0620  BP: 137/63  (!) 94/57   Pulse: 89  60   Resp:   18   Temp:   98.7 F (37.1 C)   TempSrc:      SpO2: 94%  98% 98%  Weight:  87.3 kg    Height:        Intake/Output Summary (Last 24 hours) at 08/07/2019 0801 Last data filed at 08/06/2019 1742 Gross per 24 hour  Intake 1221.25 ml  Output 600 ml  Net 621.25 ml    Last 3 Weights 08/07/2019 08/04/2019 08/03/2019  Weight (lbs) 192 lb 7.4 oz 187 lb 13.3 oz 190 lb 7.6 oz  Weight (kg) 87.3 kg 85.2 kg 86.4 kg      Telemetry    Atrial fibrillation, HR mostly in 80's to 90's peaking in 120's with activity. Occasional PVC's. - Personally Reviewed  ECG    No new tracings.   Physical Exam   General: Elderly female appearing in no acute distress. Head: Normocephalic, atraumatic.  Neck: Supple without bruits, JVD not elevated. Lungs:   Resp regular and unlabored, decreased along bases bilaterally. Heart: Irregularly irregular, S1, S2, no S3, S4, or murmur; no rub. Abdomen: Soft, non-tender, non-distended with normoactive bowel sounds. No hepatomegaly. No rebound/guarding. No obvious abdominal masses. Extremities: No clubbing, cyanosis, or lower extremity edema. Distal pedal pulses are 2+ bilaterally. Neuro: Alert and oriented X 3. Moves all extremities spontaneously. Psych: Normal affect.  Labs    Chemistry Recent Labs  Lab 08/05/19 [phone] 08/06/19 0433 08/07/19 0514  NA 140 142 140  K 4.0 3.3* 3.7  CL 103 101 100  CO2 31 32 30  GLUCOSE 144* 125* 147*  BUN 17 21 20   CREATININE 0.94 0.92 0.91  CALCIUM 9.1 9.0 9.0  GFRNONAA 56* 58* 59*  GFRAA >60 >60 >60  ANIONGAP 6 9 10      Hematology Recent Labs  Lab 08/05/19 0653 08/06/19 0433 08/07/19 0514  WBC 16.8* 13.6* 12.9*  RBC 3.46* 3.15* 3.56*  HGB 9.0* 8.2* 9.5*  HCT 31.4* 28.7* 32.1*  MCV 90.8 91.1 90.2  MCH 26.0 26.0 26.7  MCHC 28.7* 28.6* 29.6*  RDW 17.3* 17.5* 17.5*  PLT 2,148* 1,917* 1,744*    BNP Recent Labs  Lab  08/01/19 0528  BNP 737.0*     Radiology    DG CHEST PORT 1 VIEW  Result Date: 08/06/2019 CLINICAL DATA:  Followup pleural effusion EXAM: PORTABLE CHEST 1 VIEW COMPARISON:  08/04/2019 and older exams FINDINGS: Moderate right pleural effusion obscures the right hemidiaphragm without significant change from the previous exam. Mild vascular interstitial prominence is stable. No new lung abnormalities. Changes from previous cardiac surgery are stable. Cardiac silhouette is enlarged. IMPRESSION: 1. No significant change from the most recent prior study. 2. Moderate right pleural effusion. Vascular congestion and mild interstitial prominence without overt pulmonary edema. Electronically Signed   By: Lajean Manes M.D.   On: 08/06/2019 05:18    Cardiac Studies   Echocardiogram: 08/02/2019 IMPRESSIONS    1. Left ventricular ejection  fraction, by estimation, is 60 to 65%. The  left ventricle has normal function. The left ventricle has no regional  wall motion abnormalities. There is mild There is basal septal hypertrophy  left ventricular hypertrophy. Left  ventricular diastolic parameters are indeterminate.  2. Right ventricular systolic function is low normal. The right  ventricular size is mildly enlarged. There is moderately elevated  pulmonary artery systolic pressure.  3. Left atrial size was severely dilated.  4. Right atrial size was severely dilated.  5. 26 mm Sorin 3-D Memo Ring is in the MV anular position. Mild mean  gradient across the repaired valve of 6 mmHg. . The mitral valve has been  repaired/replaced. No evidence of mitral valve regurgitation.  6. 21 mm Edwards Magna-ease pericardial valve is in the AV position.  Average mean gradient is measured at 24 mmHg (within range of normal  values for valve) The anatomy of the AV is poorly visualized. . The aortic  valve has been repaired/replaced. Aortic  valve regurgitation is not visualized.   Patient Profile     84 y.o. female w/ PMH of permanent atrial fibrillation (failed prior DCCV, developed hyperthyroidism and junctional rhythm with Amiodarone in the past--> rate-control strategy pursued), valvular heart disease (s/p tissue AVR in 2014 with MV ring and maze procedure), chronic diastolic CHF, COPD, hyperthyroidism, HLD and CML with thrombocytosiswho is currently admitted for acute hypoxic respiratory failure in the setting of HCAP and acute CHF exacerbation. Cardiology asked to see due to atrial fibrillation with RVR.   Assessment & Plan    1. Atrial Fibrillation with RVR - She has permanent atrial fibrillation and had failed prior DCCV and was intolerant to Amiodarone in the past due to hyperthyroidism and junctional rhythm.  - she was transitioned to Cardizem CD 180mg  daily yesterday (on 240mg  daily prior to admission) and remains on  Toprol-XL 25mg  daily. Toprol-XL had been titrated to BID dosing prior to admission but by review of My Chart messages, she developed dizziness with this and had reduced back to once daily dosing. Given soft BP and relatively well-controlled rates, would not further titrate AV nodal blocking agents. Will order PT consult given her persistent weakness (evaluated on 3/4 and SNF was recommended).  - remains on Eliquis 5mg  BID for anticoagulation.   2. Acute on Chronic Diastolic CHF Exacerbation - she did receive IV Lasix 40mg  BID yesterday. I&O's not accurate and recorded weights have been variable with no consistent trend. Given reported orthopnea and PND overnight, would continue with IV Lasix today. Received pRBCs yesterday which could have contributed as well. PTA medications listed as being on Torsemide 80mg  in AM/40mg  in PM but by review of Dr. Claris Gladden note on 06/04/2019, this  was reduced to 60mg  daily given recent orthostatic symptoms. Can hopefully restart her PO regimen in the next 24-48 hours.   3. Valvular Heart Disease  - s/p tissue AVR in 2014 with MV ring and maze procedure. Repeat echo this admission showed no evidence of mitral valve regurgitation and AV was poorly visualized but average mean gradient was 24 mmHg.   4. AKI - creatinine peaked at 1.66 this admission on 07/30/2019. Improved to 0.91 on most recent check.   5. COPD Exacerbation - being followed by Pulmonology.   6. CML with thrombocytosis and anemia - Received 1 unit pRBC's yesterday and Hgb improved from 8.2 to 9.5. WBC at 12.9 and platelets 1744 K. Being followed by Oncology wuth plans to start [place] today.    For questions or updates, please contact [place] Please consult [email or web address] for contact info under Cardiology/STEMI.   Arna Medici , PA-C 8:01 AM 08/07/2019 Pager: [phone]   Attending note:  Case discussed with Ms. Delano Metz, I agree with her above assessment.  Overnight course reviewed as well as follow-up vital signs and lab work.  Patient remains in atrial fibrillation with adequate heart rate control on Cardizem CD and Toprol-XL.  Need to monitor for further up titration depending on increasing activity with time.  She also remains on Eliquis for stroke prophylaxis.  Systolic blood pressures ranging mid 90s to 130s, heart rates averaging in the 90-100 range.  Intake and output not completely accurate last 24 hours.  She remains on IV Lasix currently at 40 mg daily.  No major changes recommended to cardiac medications at this time.  We will strive for further diuresis as long as tolerated in terms of blood pressure, renal function has been stable.  Continue to follow on telemetry.  Increase activity as tolerated.  Satira Sark, M.D., F.A.C.C.

## 2019-08-08 ENCOUNTER — Other Ambulatory Visit (HOSPITAL_COMMUNITY): Payer: Medicare Other

## 2019-08-08 ENCOUNTER — Ambulatory Visit (HOSPITAL_COMMUNITY): Payer: Medicare Other | Admitting: Hematology

## 2019-08-08 DIAGNOSIS — I509 Heart failure, unspecified: Secondary | ICD-10-CM

## 2019-08-08 LAB — CBC
HCT: 30.3 % — ABNORMAL LOW (ref 36.0–46.0)
Hemoglobin: 9.1 g/dL — ABNORMAL LOW (ref 12.0–15.0)
MCH: 27.2 pg (ref 26.0–34.0)
MCHC: 30 g/dL (ref 30.0–36.0)
MCV: 90.7 fL (ref 80.0–100.0)
Platelets: 1593 10*3/uL (ref 150–400)
RBC: 3.34 MIL/uL — ABNORMAL LOW (ref 3.87–5.11)
RDW: 18 % — ABNORMAL HIGH (ref 11.5–15.5)
WBC: 9.4 10*3/uL (ref 4.0–10.5)
nRBC: 0 % (ref 0.0–0.2)

## 2019-08-08 LAB — BASIC METABOLIC PANEL
Anion gap: 8 (ref 5–15)
BUN: 22 mg/dL (ref 8–23)
CO2: 31 mmol/L (ref 22–32)
Calcium: 9 mg/dL (ref 8.9–10.3)
Chloride: 102 mmol/L (ref 98–111)
Creatinine, Ser: 1.03 mg/dL — ABNORMAL HIGH (ref 0.44–1.00)
GFR calc Af Amer: 59 mL/min — ABNORMAL LOW (ref >60)
GFR calc non Af Amer: 51 mL/min — ABNORMAL LOW (ref >60)
Glucose, Bld: 123 mg/dL — ABNORMAL HIGH (ref 70–99)
Potassium: 4.1 mmol/L (ref 3.5–5.1)
Sodium: 141 mmol/L (ref 135–145)

## 2019-08-08 LAB — RESPIRATORY PANEL BY RT PCR (FLU A&B, COVID)
Influenza A by PCR: NEGATIVE
Influenza B by PCR: NEGATIVE
SARS Coronavirus 2 by RT PCR: NEGATIVE

## 2019-08-08 LAB — MAGNESIUM: Magnesium: 2.2 mg/dL (ref 1.7–2.4)

## 2019-08-08 MED ORDER — TORSEMIDE 20 MG PO TABS
60.0000 mg | ORAL_TABLET | Freq: Every day | ORAL | Status: DC
  Administered 2019-08-09: 60 mg via ORAL
  Filled 2019-08-08: qty 3

## 2019-08-08 NOTE — Progress Notes (Signed)
CRITICAL VALUE ALERT  Critical Value: platelets 1593   Date & Time Notied:  08/08/19 0647  Provider Notified: Dr. Darrick Meigs  Orders Received/Actions taken:

## 2019-08-08 NOTE — Progress Notes (Signed)
Physical Therapy Treatment Patient Details Name: Nicole [name] MRN: [id number] DOB: Sep 22, 1935 Today's Date: 08/08/2019    History of Present Illness Nicole [name] is a 84 y.o. female with a history of COPD, diastolic heart failure, atrial fibrillation on anticoagulation, CML with thrombocytosis.  She was recently admitted about a month ago for similar complaints.  She DU with A. fib and RVR.  She did complete doxycycline for total of 5 days for bronchitis/bronchiectasis.  Today, she presents with 1 week of worsening shortness of breath which has been worsening and is now severe.  Symptoms are worse with ambulation and improved with rest.  No other palliating or provoking factors.  Have mild cough.  She had been prescribed doxycycline and prednisone by her oncology physician, although she has not been get these medications.    PT Comments     pt in bed upon arrival, aggreable to therapy. Pt request to stay in room for ambulation.  Pt did not need any physical assist from therapist to sit to side of bed and transfer to standing.  Pt ambulated 35 feet with O2 without distress, however needed a rest break upon return to sitting.  Pt completed seated therex without cues needed other than completing more slowly.  Pt remained sitting at end of session   Follow Up Recommendations        Equipment Recommendations       Recommendations for Other Services       Precautions / Restrictions Precautions Precautions: Fall Restrictions Weight Bearing Restrictions: No    Mobility  Bed Mobility Overal bed mobility: Modified Independent Bed Mobility: Supine to Sit     Supine to sit: Modified independent (Device/Increase time)     General bed mobility comments: seated in chair at end of session  Transfers                    Ambulation/Gait Ambulation/Gait assistance: Supervision Gait Distance (Feet): 35 Feet Assistive device: Rolling walker (2 wheeled) Gait Pattern/deviations: Decreased  step length - right;Decreased step length - left;Decreased stride length Gait velocity: decreased   General Gait Details: slow cadence without any standing rest breaks due to difficulty breathing but required rest upon sitting due to SOB.  Pt remained on 4L nasal cannula during session.   Stairs             Wheelchair Mobility    Modified Rankin (Stroke Patients Only)       Balance                                            Cognition Arousal/Alertness: Awake/alert Behavior During Therapy: WFL for tasks assessed/performed Overall Cognitive Status: Within Functional Limits for tasks assessed                                        Exercises General Exercises - Lower Extremity Long Arc Quad: AROM;Both;20 reps;Seated Hip Flexion/Marching: AROM;Both;20 reps;Seated Toe Raises: AROM;Both;Seated Heel Raises: AROM;Both;20 reps;Seated    General Comments        Pertinent Vitals/Pain Pain Assessment: No/denies pain    Home Living                      Prior Function  PT Goals (current goals can now be found in the care plan section)      Frequency           PT Plan  continue plan of care.    Co-evaluation              AM-PAC PT "6 Clicks" Mobility   Outcome Measure  Help needed turning from your back to your side while in a flat bed without using bedrails?: None Help needed moving from lying on your back to sitting on the side of a flat bed without using bedrails?: None Help needed moving to and from a bed to a chair (including a wheelchair)?: A Little Help needed standing up from a chair using your arms (e.g., wheelchair or bedside chair)?: None Help needed to walk in hospital room?: A Little Help needed climbing 3-5 steps with a railing? : A Lot 6 Click Score: 20    End of Session Equipment Utilized During Treatment: Gait belt Activity Tolerance: Patient tolerated treatment well;Patient  limited by fatigue Patient left: in chair;with call bell/phone within reach Nurse Communication: Mobility status PT Visit Diagnosis: Unsteadiness on feet (R26.81);Other abnormalities of gait and mobility (R26.89);Muscle weakness (generalized) (M62.81)     Time: 1125-1150 PT Time Calculation (min) (ACUTE ONLY): 25 min  Charges:  $Gait Training: 8-22 mins $Therapeutic Exercise: 8-22 mins                     Teena Irani, PTA/CLT [place], Edwardine Deschepper B 08/08/2019, 11:53 AM

## 2019-08-08 NOTE — Consult Note (Signed)
[facility] Oncology Progress Note  Name: Nicole [name]      MRN: [id number]    Location: A311/A311-01  Date: 08/08/2019 Time:6:05 PM   Subjective: Interval History:Aneesa Prestigiacomo is seen for follow-up of thrombocytosis from CML.  She is sitting in chair.  She walked around the room today.  Denies any worsening shortness of breath than her baseline.  She does not remember if she took her [place] today.  Objective: Vital signs in last 24 hours: Temp:  [98.2 F (36.8 C)] 98.2 F (36.8 C) (03/10 0553) Pulse Rate:  [72-101] 101 (03/10 0553) Resp:  [20] 20 (03/10 0553) BP: (101-102)/(69-85) 102/69 (03/10 0553) SpO2:  [96 %-99 %] 99 % (03/10 0828)    Intake/Output from previous day: 03/09 0800 - 03/10 0759 In: 480 [P.O.:480] Out: 1400 [Urine:1400]    Intake/Output this shift: No intake/output data recorded.   PHYSICAL EXAM: BP 102/69 (BP Location: Right Arm)   Pulse (!) 101   Temp 98.2 F (36.8 C) (Oral)   Resp 20   Ht 5\' 3"  (1.6 m)   Wt 192 lb 7.4 oz (87.3 kg)   SpO2 99%   BMI 34.09 kg/m  General appearance: alert, cooperative and appears stated age Head: Normocephalic, without obvious abnormality, atraumatic Lungs: Bilateral air entry with occasional crepitations at bases. Heart: irregularly irregular rhythm Abdomen: Soft, nontender with no palpable masses. Extremities: 1+ edema bilaterally. Skin: Skin color, texture, turgor normal. No rashes or lesions Neurologic: Grossly normal   Studies/Results: Results for orders placed or performed during the hospital encounter of 07/27/19 (from the past 48 hour(s))  Basic metabolic panel     Status: Abnormal   Collection Time: 08/07/19  5:14 AM  Result Value Ref Range   Sodium 140 135 - 145 mmol/L   Potassium 3.7 3.5 - 5.1 mmol/L   Chloride 100 98 - 111 mmol/L   CO2 30 22 - 32 mmol/L   Glucose, Bld 147 (H) 70 - 99 mg/dL    Comment: Glucose reference range applies only to samples taken after fasting for at least 8 hours.    BUN 20 8 - 23 mg/dL   Creatinine, Ser 0.91 0.44 - 1.00 mg/dL   Calcium 9.0 8.9 - 10.3 mg/dL   GFR calc non Af Amer 59 (L) >60 mL/min   GFR calc Af Amer >60 >60 mL/min   Anion gap 10 5 - 15    Comment: Performed at [facility], [address], [place], [place] [address]  CBC     Status: Abnormal   Collection Time: 08/07/19  5:14 AM  Result Value Ref Range   WBC 12.9 (H) 4.0 - 10.5 K/uL   RBC 3.56 (L) 3.87 - 5.11 MIL/uL   Hemoglobin 9.5 (L) 12.0 - 15.0 g/dL   HCT 32.1 (L) 36.0 - 46.0 %   MCV 90.2 80.0 - 100.0 fL   MCH 26.7 26.0 - 34.0 pg   MCHC 29.6 (L) 30.0 - 36.0 g/dL   RDW 17.5 (H) 11.5 - 15.5 %   Platelets 1,744 (HH) 150 - 400 K/uL    Comment: REPEATED TO VERIFY CONSISTENT WITH PREVIOUS RESULT THIS CRITICAL RESULT HAS VERIFIED AND BEEN CALLED TO MAYO,T BY SHERRI HUFFINES ON 03 09 2021 AT 0617, AND HAS BEEN READ BACK.     nRBC 0.0 0.0 - 0.2 %    Comment: Performed at [facility], [address] [place], [place] [address]  Magnesium     Status: None   Collection Time: 08/07/19  5:14 AM  Result Value Ref Range   Magnesium 2.3 1.7 - 2.4 mg/dL    Comment: Performed at [facility], [address] [place], [place] [address]  CBC     Status: Abnormal   Collection Time: 08/08/19  5:50 AM  Result Value Ref Range   WBC 9.4 4.0 - 10.5 K/uL   RBC 3.34 (L) 3.87 - 5.11 MIL/uL   Hemoglobin 9.1 (L) 12.0 - 15.0 g/dL   HCT 30.3 (L) 36.0 - 46.0 %   MCV 90.7 80.0 - 100.0 fL   MCH 27.2 26.0 - 34.0 pg   MCHC 30.0 30.0 - 36.0 g/dL   RDW 18.0 (H) 11.5 - 15.5 %   Platelets 1,593 (HH) 150 - 400 K/uL    Comment: PLATELET COUNT CONFIRMED BY SMEAR SPECIMEN CHECKED FOR CLOTS THIS CRITICAL RESULT HAS VERIFIED AND BEEN CALLED TO [place],C. BY ED AGUNDIZ ON 03 10 2021 AT 0646, AND HAS BEEN READ BACK.     nRBC 0.0 0.0 - 0.2 %    Comment: Performed at [facility], [address], [place], [place] [id number]  Basic metabolic panel     Status: Abnormal   Collection Time: 08/08/19  5:50 AM  Result  Value Ref Range   Sodium 141 135 - 145 mmol/L   Potassium 4.1 3.5 - 5.1 mmol/L   Chloride 102 98 - 111 mmol/L   CO2 31 22 - 32 mmol/L   Glucose, Bld 123 (H) 70 - 99 mg/dL    Comment: Glucose reference range applies only to samples taken after fasting for at least 8 hours.   BUN 22 8 - 23 mg/dL   Creatinine, Ser 1.03 (H) 0.44 - 1.00 mg/dL   Calcium 9.0 8.9 - 10.3 mg/dL   GFR calc non Af Amer 51 (L) >60 mL/min   GFR calc Af Amer 59 (L) >60 mL/min   Anion gap 8 5 - 15    Comment: Performed at [facility], [address] [place], [place] [address]  Magnesium     Status: None   Collection Time: 08/08/19  5:50 AM  Result Value Ref Range   Magnesium 2.2 1.7 - 2.4 mg/dL    Comment: Performed at [facility], [address], [place], [place] [address]  Respiratory Panel by RT PCR (Flu A&B, Covid) - Nasopharyngeal Swab     Status: None   Collection Time: 08/08/19 10:33 AM   Specimen: Nasopharyngeal Swab  Result Value Ref Range   SARS Coronavirus 2 by RT PCR NEGATIVE NEGATIVE    Comment: (NOTE) SARS-CoV-2 target nucleic acids are NOT DETECTED. The SARS-CoV-2 RNA is generally detectable in upper respiratoy specimens during the acute phase of infection. The lowest concentration of SARS-CoV-2 viral copies this assay can detect is 131 copies/mL. A negative result does not preclude SARS-Cov-2 infection and should not be used as the sole basis for treatment or other patient management decisions. A negative result may occur with  improper specimen collection/handling, submission of specimen other than nasopharyngeal swab, presence of viral mutation(s) within the areas targeted by this assay, and inadequate number of viral copies (<131 copies/mL). A negative result must be combined with clinical observations, patient history, and epidemiological information. The expected result is Negative. Fact Sheet for Patients:  PinkCheek.be Fact Sheet for Healthcare Providers:   GravelBags.it This test is not yet ap proved or cleared by the [place] FDA and  has been authorized for detection and/or diagnosis of SARS-CoV-2 by FDA under an Emergency Use  Authorization (EUA). This EUA will remain  in effect (meaning this test can be used) for the duration of the COVID-19 declaration under Section 564(b)(1) of the Act, 21 U.S.C. section 360bbb-3(b)(1), unless the authorization is terminated or revoked sooner.    Influenza A by PCR NEGATIVE NEGATIVE   Influenza B by PCR NEGATIVE NEGATIVE    Comment: (NOTE) The Xpert Xpress SARS-CoV-2/FLU/RSV assay is intended as an aid in  the diagnosis of influenza from Nasopharyngeal swab specimens and  should not be used as a sole basis for treatment. Nasal washings and  aspirates are unacceptable for Xpert Xpress SARS-CoV-2/FLU/RSV  testing. Fact Sheet for Patients: PinkCheek.be Fact Sheet for Healthcare Providers: GravelBags.it This test is not yet approved or cleared by the [place] FDA and  has been authorized for detection and/or diagnosis of SARS-CoV-2 by  FDA under an Emergency Use Authorization (EUA). This EUA will remain  in effect (meaning this test can be used) for the duration of the  Covid-19 declaration under Section 564(b)(1) of the Act, 21  U.S.C. section 360bbb-3(b)(1), unless the authorization is  terminated or revoked. Performed at [facility], [address] [place], [place] [address]    No results found.   MEDICATIONS: I have reviewed the patient's current medications.     Assessment/Plan:  1.  CML: -Gleevec 100 mg daily started on 08/07/2019.  Hydroxyurea discontinued. -She did not experience any nausea or vomiting. -CBC from today shows platelet count improved to 1593.  Hemoglobin is 9.1.  White count already improved to 9.4. -Last transfusion was on 08/06/2019. -She will likely be discharged to the  rehab facility tomorrow.  She will continue Gleevec.  I will see her back in 7 days for follow-up.  2.  Atrial fibrillation: -She will continue Eliquis, Cardizem CD 180 mg daily and Toprol-XL 25 mg daily. -IV Lasix will be transitioned to Demadex 60 mg tomorrow.  3.  COPD: -Continue Pulmicort, Brovana nebulizer, as needed albuterol. -Chest x-ray from 08/06/2019 showed moderate right pleural effusion.  Vascular congestion and mild interstitial prominence without overt edema.  All questions were answered. The patient knows to call the clinic with any problems, questions or concerns. We can certainly see the patient much sooner if necessary.     Derek Jack

## 2019-08-08 NOTE — Progress Notes (Signed)
PROGRESS NOTE    Laykin Mazak  [id number] DOB: 04-27-1936 DOA: 07/27/2019 PCP: Frances Maywood, FNP   Brief Narrative:  Per HPI: [name] a 84 y.o.femalewith a history of COPD, diastolic heart failure, atrial fibrillation on anticoagulation, CML with thrombocytosis. She was recently admitted about a month ago for similar complaints. She presented with Afib and RVR. She did complete doxycycline for total of 5 days for bronchitis/bronchiectasis.  On day of admission she presented with 1 week of worsening shortness of breath which has been worsening and is now severe. Symptoms are worse with ambulation and improved with rest. No other palliating or provoking factors.  She had been prescribed doxycycline and prednisone by her oncology physician, although she has not been get these medications.  3/10: Patient appears to be doing okay this morning with stable heart rate and blood pressure control noted.  Cardiology with plans to continue IV Lasix through today and then transition to oral by tomorrow.  Patient has been started on [place] 3/9 and will receive her afternoon dose later today.  Assessment & Plan:   Active Problems:   CAD (coronary artery disease)   Atrial fibrillation (HCC)   Chronic diastolic CHF (congestive heart failure) (HCC)   Aortic stenosis   Thrombocytosis (HCC)   COPD exacerbation (HCC)   CML (chronic myelocytic leukemia) ([place])   HCAP (healthcare-associated pneumonia)   Acute respiratory failure with hypoxia (HCC)   DNR (do not resuscitate)   Pleural effusion   Sepsis ([place])   Acute exacerbation of CHF (congestive heart failure) ([place])   Acute hypoxemic respiratory failure likely secondary to HCAP with some mild pulmonary vascular congestionwith acute on chronic diastolic heart failure -completed cefepime.  -bronchodilators as needed.  -Patient is normally on room air at home, wean oxygen as able -Appreciate PCCM ongoing evaluation.  They have  signed off with nothing further to offer.   -Continue IS -Continue IV Lasix per cardiology and then transition to oral by tomorrow  Atrial fibrillation with RVR-improved -Currently rate controlled and diltiazem is being titrated by cardiology service -Failed prior DCCV and trial of amiodarone -Appreciate cardiology evaluation with recommendations to start cardizem CD 180 mg daily and toprol XL 25 mg daily. -Continue apixaban for full anticoagulation  AKI on CKD stage 3a -monitoring while being diuresed with IV lasix -Creatinine at 1.03; follow up BMET in am -Monitoring urine output  COPD with mild exacerbation related to above-improved  CML with thrombocytosis -Followed by Dr. Delton Coombes outpatient -Consulted Dr. Delton Coombes, he has started patient on [place] that she is tolerating well. Pt is to take [facility] supply with her to the SNF as this medication is very expensive and the SNF would not be able to obtain the medication.  Pt will follow up with Dr. Delton Coombes in 7-10 days after the start of [place].     Goiter with signs of hyperthyroidism -Previously had been on amiodarone -T4 level slightly elevated with low TSH -Pt will need endocrinology follow-up outpatient  Anemia in neoplastic disease - pt is s/p 1 unit PRBC on 08/01/19.  Pt felt a lot better after the transfusion and seems to be stronger.  After speaking with Dr. Raliegh Ip today he requested 1 additional unit of PRBC to be given 08/06/19 and Hg has remained stable at 9.1.      DVT prophylaxis: Apixaban Code Status: DNR Family Communication: We will call daughter Disposition Plan: Per cardiology with diuresis.  Anticipate discharge in a.m. if stable and euvolemic.  Covid testing pending.  Consultants:   PCCM  Cardiology  Oncology  Procedures:   See below  Antimicrobials:  Anti-infectives (From admission, onward)   Start     Dose/Rate Route Frequency Ordered Stop   07/31/19 1800  ceFEPIme (MAXIPIME) 2 g in  sodium chloride 0.9 % 100 mL IVPB     2 g 200 mL/hr over 30 Minutes Intravenous  Once 07/31/19 0837 07/31/19 2047   07/31/19 0600  ceFEPIme (MAXIPIME) 2 g in sodium chloride 0.9 % 100 mL IVPB     2 g 200 mL/hr over 30 Minutes Intravenous Every 24 hours 07/30/19 1144 07/31/19 2048   07/28/19 1800  vancomycin (VANCOREADY) IVPB 500 mg/100 mL  Status:  Discontinued     500 mg 100 mL/hr over 60 Minutes Intravenous Every 24 hours 07/27/19 1744 07/29/19 0945   07/27/19 1800  ceFEPIme (MAXIPIME) 2 g in sodium chloride 0.9 % 100 mL IVPB  Status:  Discontinued     2 g 200 mL/hr over 30 Minutes Intravenous Every 12 hours 07/27/19 1714 07/30/19 1144   07/27/19 1800  vancomycin (VANCOREADY) IVPB 1500 mg/300 mL     1,500 mg 150 mL/hr over 120 Minutes Intravenous  Once 07/27/19 1714 07/27/19 2012   07/27/19 1700  vancomycin (VANCOCIN) IVPB 1000 mg/200 mL premix  Status:  Discontinued     1,000 mg 200 mL/hr over 60 Minutes Intravenous  Once 07/27/19 1658 07/27/19 1714   07/27/19 1700  ceFEPIme (MAXIPIME) 2 g in sodium chloride 0.9 % 100 mL IVPB  Status:  Discontinued     2 g 200 mL/hr over 30 Minutes Intravenous  Once 07/27/19 1658 07/27/19 1714       Subjective: Patient seen and evaluated today with no new acute complaints or concerns. No acute concerns or events noted overnight.  She denies any chest pain or palpitations.  Objective: Vitals:   08/07/19 2021 08/07/19 2308 08/08/19 0553 08/08/19 0828  BP:  101/85 102/69   Pulse:  72 (!) 101   Resp:  20 20   Temp:  98.2 F (36.8 C) 98.2 F (36.8 C)   TempSrc:  Oral Oral   SpO2: 96% 96% 97% 99%  Weight:      Height:        Intake/Output Summary (Last 24 hours) at 08/08/2019 1409 Last data filed at 08/08/2019 0500 Gross per 24 hour  Intake --  Output 1400 ml  Net -1400 ml   Filed Weights   08/03/19 0500 08/04/19 0644 08/07/19 0500  Weight: 86.4 kg 85.2 kg 87.3 kg    Examination:  General exam: Appears calm and comfortable   Respiratory system: Clear to auscultation. Respiratory effort normal.  Currently on 3-4 L nasal cannula oxygen. Cardiovascular system: S1 & S2 heard, RRR. No JVD, murmurs, rubs, gallops or clicks. No pedal edema. Gastrointestinal system: Abdomen is nondistended, soft and nontender. No organomegaly or masses felt. Normal bowel sounds heard. Central nervous system: Alert and oriented. No focal neurological deficits. Extremities: Symmetric 5 x 5 power. Skin: No rashes, lesions or ulcers Psychiatry: Flat affect    Data Reviewed: I have personally reviewed following labs and imaging studies  CBC: Recent Labs  Lab 08/04/19 0631 08/05/19 0653 08/06/19 0433 08/07/19 0514 08/08/19 0550  WBC 14.1* 16.8* 13.6* 12.9* 9.4  NEUTROABS  --   --  11.1*  --   --   HGB 8.8* 9.0* 8.2* 9.5* 9.1*  HCT 30.9* 31.4* 28.7* 32.1* 30.3*  MCV 91.4 90.8 91.1 90.2 90.7  PLT 2,069* 2,148*  1,917* 1,744* 123XX123*   Basic Metabolic Panel: Recent Labs  Lab 08/04/19 0631 08/05/19 0653 08/06/19 0433 08/07/19 0514 08/08/19 0550  NA 142 140 142 140 141  K 3.4* 4.0 3.3* 3.7 4.1  CL 103 103 101 100 102  CO2 31 31 32 30 31  GLUCOSE 116* 144* 125* 147* 123*  BUN 18 17 21 20 22   CREATININE 0.94 0.94 0.92 0.91 1.03*  CALCIUM 8.9 9.1 9.0 9.0 9.0  MG 2.3 2.5* 2.2 2.3 2.2   GFR: Estimated Creatinine Clearance: 44.1 mL/min (A) (by C-G formula based on SCr of 1.03 mg/dL (H)). Liver Function Tests: No results for input(s): AST, ALT, ALKPHOS, BILITOT, PROT, ALBUMIN in the last 168 hours. No results for input(s): LIPASE, AMYLASE in the last 168 hours. No results for input(s): AMMONIA in the last 168 hours. Coagulation Profile: No results for input(s): INR, PROTIME in the last 168 hours. Cardiac Enzymes: No results for input(s): CKTOTAL, CKMB, CKMBINDEX, TROPONINI in the last 168 hours. BNP (last 3 results) No results for input(s): PROBNP in the last 8760 hours. HbA1C: No results for input(s): HGBA1C in the last  72 hours. CBG: No results for input(s): GLUCAP in the last 168 hours. Lipid Profile: No results for input(s): CHOL, HDL, LDLCALC, TRIG, CHOLHDL, LDLDIRECT in the last 72 hours. Thyroid Function Tests: No results for input(s): TSH, T4TOTAL, FREET4, T3FREE, THYROIDAB in the last 72 hours. Anemia Panel: No results for input(s): VITAMINB12, FOLATE, FERRITIN, TIBC, IRON, RETICCTPCT in the last 72 hours. Sepsis Labs: No results for input(s): PROCALCITON, LATICACIDVEN in the last 168 hours.  Recent Results (from the past 240 hour(s))  SARS CORONAVIRUS 2 (TAT 6-24 HRS) Nasopharyngeal Nasopharyngeal Swab     Status: None   Collection Time: 08/01/19  4:45 AM   Specimen: Nasopharyngeal Swab  Result Value Ref Range Status   SARS Coronavirus 2 NEGATIVE NEGATIVE Final    Comment: (NOTE) SARS-CoV-2 target nucleic acids are NOT DETECTED. The SARS-CoV-2 RNA is generally detectable in upper and lower respiratory specimens during the acute phase of infection. Negative results do not preclude SARS-CoV-2 infection, do not rule out co-infections with other pathogens, and should not be used as the sole basis for treatment or other patient management decisions. Negative results must be combined with clinical observations, patient history, and epidemiological information. The expected result is Negative. Fact Sheet for Patients: SugarRoll.be Fact Sheet for Healthcare Providers: https://www.woods-mathews.com/ This test is not yet approved or cleared by the [place] FDA and  has been authorized for detection and/or diagnosis of SARS-CoV-2 by FDA under an Emergency Use Authorization (EUA). This EUA will remain  in effect (meaning this test can be used) for the duration of the COVID-19 declaration under Section 56 4(b)(1) of the Act, 21 U.S.C. section 360bbb-3(b)(1), unless the authorization is terminated or revoked sooner. Performed at [facility],  [place] [address] [place], [place] [address]   SARS CORONAVIRUS 2 (TAT 6-24 HRS) Nasopharyngeal Nasopharyngeal Swab     Status: None   Collection Time: 08/02/19  2:56 PM   Specimen: Nasopharyngeal Swab  Result Value Ref Range Status   SARS Coronavirus 2 NEGATIVE NEGATIVE Final    Comment: (NOTE) SARS-CoV-2 target nucleic acids are NOT DETECTED. The SARS-CoV-2 RNA is generally detectable in upper and lower respiratory specimens during the acute phase of infection. Negative results do not preclude SARS-CoV-2 infection, do not rule out co-infections with other pathogens, and should not be used as the sole basis for treatment or other  patient management decisions. Negative results must be combined with clinical observations, patient history, and epidemiological information. The expected result is Negative. Fact Sheet for Patients: SugarRoll.be Fact Sheet for Healthcare Providers: https://www.woods-mathews.com/ This test is not yet approved or cleared by the [place] FDA and  has been authorized for detection and/or diagnosis of SARS-CoV-2 by FDA under an Emergency Use Authorization (EUA). This EUA will remain  in effect (meaning this test can be used) for the duration of the COVID-19 declaration under Section 56 4(b)(1) of the Act, 21 U.S.C. section 360bbb-3(b)(1), unless the authorization is terminated or revoked sooner. Performed at [facility], [place] [address] [place], [place] [address]   SARS CORONAVIRUS 2 (TAT 6-24 HRS) Nasopharyngeal Nasopharyngeal Swab     Status: None   Collection Time: 08/05/19  5:23 PM   Specimen: Nasopharyngeal Swab  Result Value Ref Range Status   SARS Coronavirus 2 NEGATIVE NEGATIVE Final    Comment: (NOTE) SARS-CoV-2 target nucleic acids are NOT DETECTED. The SARS-CoV-2 RNA is generally detectable in upper and lower respiratory specimens during the acute phase of infection. Negative results do not  preclude SARS-CoV-2 infection, do not rule out co-infections with other pathogens, and should not be used as the sole basis for treatment or other patient management decisions. Negative results must be combined with clinical observations, patient history, and epidemiological information. The expected result is Negative. Fact Sheet for Patients: SugarRoll.be Fact Sheet for Healthcare Providers: https://www.woods-mathews.com/ This test is not yet approved or cleared by the [place] FDA and  has been authorized for detection and/or diagnosis of SARS-CoV-2 by FDA under an Emergency Use Authorization (EUA). This EUA will remain  in effect (meaning this test can be used) for the duration of the COVID-19 declaration under Section 56 4(b)(1) of the Act, 21 U.S.C. section 360bbb-3(b)(1), unless the authorization is terminated or revoked sooner. Performed at [facility], [place] [address], [place], [place] [address]          Radiology Studies: No results found.      Scheduled Meds: . apixaban  5 mg Oral BID  . arformoterol  15 mcg Nebulization BID  . atorvastatin  40 mg Oral q1800  . budesonide (PULMICORT) nebulizer solution  0.5 mg Nebulization BID  . busPIRone  10 mg Oral BID  . diltiazem  180 mg Oral Daily  . docusate sodium  200 mg Oral QHS  . DULoxetine  20 mg Oral Daily  . feeding supplement (ENSURE ENLIVE)  237 mL Oral Q24H  . ferrous sulfate  325 mg Oral Q breakfast  . imatinib  100 mg Oral Q breakfast  . mouth rinse  15 mL Mouth Rinse BID  . metoprolol succinate  25 mg Oral Daily  . multivitamin with minerals  1 tablet Oral Daily  . [START ON 08/09/2019] torsemide  60 mg Oral Daily   Continuous Infusions:   LOS: 12 days    Time spent: 30 minutes    Aidenjames Heckmann Darleen Crocker, DO [facility] Pager [phone]  If 7PM-7AM, please contact night-coverage [email or web address] Password [email or web address] 08/08/2019, 2:09 PM

## 2019-08-08 NOTE — Telephone Encounter (Signed)
Nicole [name] picked up medication from [facility] and delivered to patients bedside.

## 2019-08-08 NOTE — Care Management Important Message (Signed)
Important Message  Patient Details  Name: Nicole [name] MRN: [id number] Date of Birth: 1935/12/26   Medicare Important Message Given:  Yes     [name] 08/08/2019, 12:16 PM

## 2019-08-08 NOTE — Progress Notes (Signed)
Progress Note  Patient Name: Nicole [name] Date of Encounter: 08/08/2019  Primary Cardiologist: [name], MD  Subjective   States that she slept better, sat up in a chair yesterday.  No chest pain or palpitations at rest.  Inpatient Medications    Scheduled Meds: . apixaban  5 mg Oral BID  . arformoterol  15 mcg Nebulization BID  . atorvastatin  40 mg Oral q1800  . budesonide (PULMICORT) nebulizer solution  0.5 mg Nebulization BID  . busPIRone  10 mg Oral BID  . diltiazem  180 mg Oral Daily  . docusate sodium  200 mg Oral QHS  . DULoxetine  20 mg Oral Daily  . feeding supplement (ENSURE ENLIVE)  237 mL Oral Q24H  . ferrous sulfate  325 mg Oral Q breakfast  . furosemide  40 mg Intravenous Daily  . imatinib  100 mg Oral Q breakfast  . mouth rinse  15 mL Mouth Rinse BID  . metoprolol succinate  25 mg Oral Daily  . multivitamin with minerals  1 tablet Oral Daily    PRN Meds: acetaminophen, albuterol, ALPRAZolam, ondansetron (ZOFRAN) IV, polyethylene glycol, prochlorperazine   Vital Signs    Vitals:   08/07/19 2021 08/07/19 2308 08/08/19 0553 08/08/19 0828  BP:  101/85 102/69   Pulse:  72 (!) 101   Resp:  20 20   Temp:  98.2 F (36.8 C) 98.2 F (36.8 C)   TempSrc:  Oral Oral   SpO2: 96% 96% 97% 99%  Weight:      Height:        Intake/Output Summary (Last 24 hours) at 08/08/2019 0944 Last data filed at 08/08/2019 0500 Gross per 24 hour  Intake 480 ml  Output 1400 ml  Net -920 ml   Filed Weights   08/03/19 0500 08/04/19 0644 08/07/19 0500  Weight: 86.4 kg 85.2 kg 87.3 kg    Telemetry    Atrial fibrillation.  Personally reviewed.  Physical Exam   GEN:  Elderly woman, no acute distress.   Neck: No JVD. Cardiac:  Irregularly irregular, no murmur, rub, or gallop.  Respiratory: Nonlabored.  Decreased breath sounds without crackles. GI: Soft, nontender, bowel sounds present. MS: No edema; No deformity. Neuro:  Nonfocal. Psych: Alert and oriented x 3.  Normal affect.  Labs    Chemistry Recent Labs  Lab 08/06/19 0433 08/07/19 0514 08/08/19 0550  NA 142 140 141  K 3.3* 3.7 4.1  CL 101 100 102  CO2 32 30 31  GLUCOSE 125* 147* 123*  BUN 21 20 22   CREATININE 0.92 0.91 1.03*  CALCIUM 9.0 9.0 9.0  GFRNONAA 58* 59* 51*  GFRAA >60 >60 59*  ANIONGAP 9 10 8      Hematology Recent Labs  Lab 08/06/19 0433 08/07/19 0514 08/08/19 0550  WBC 13.6* 12.9* 9.4  RBC 3.15* 3.56* 3.34*  HGB 8.2* 9.5* 9.1*  HCT 28.7* 32.1* 30.3*  MCV 91.1 90.2 90.7  MCH 26.0 26.7 27.2  MCHC 28.6* 29.6* 30.0  RDW 17.5* 17.5* 18.0*  PLT 1,917* 1,744* 1,593*    Cardiac Enzymes Recent Labs  Lab 07/27/19 1529 07/27/19 1709 07/29/19 1712 07/29/19 1813  TROPONINIHS 17 18* 68* 70*    Radiology    No results found.  Cardiac Studies   Echocardiogram: 08/02/2019 IMPRESSIONS   1. Left ventricular ejection fraction, by estimation, is 60 to 65%. The  left ventricle has normal function. The left ventricle has no regional  wall motion abnormalities. There is mild There is  basal septal hypertrophy  left ventricular hypertrophy. Left  ventricular diastolic parameters are indeterminate.  2. Right ventricular systolic function is low normal. The right  ventricular size is mildly enlarged. There is moderately elevated  pulmonary artery systolic pressure.  3. Left atrial size was severely dilated.  4. Right atrial size was severely dilated.  5. 26 mm Sorin 3-D Memo Ring is in the MV anular position. Mild mean  gradient across the repaired valve of 6 mmHg. . The mitral valve has been  repaired/replaced. No evidence of mitral valve regurgitation.  6. 21 mm Edwards Magna-ease pericardial valve is in the AV position.  Average mean gradient is measured at 24 mmHg (within range of normal  values for valve) The anatomy of the AV is poorly visualized. . The aortic  valve has been repaired/replaced. Aortic  valve regurgitation is not visualized.    Patient Profile     84 y.o. female with a history of permanent atrial fibrillation (failed prior DCCV, developed hyperthyroidism and junctional rhythm with Amiodarone in the past--> rate-control strategy pursued), valvular heart disease (s/p tissue AVR in 2014 with MV ring and maze procedure), chronic diastolic CHF, COPD, hyperthyroidism, HLD and CML with thrombocytosiswho is currently admitted for acute hypoxic respiratory failure in the setting of HCAP and acute CHF exacerbation. Cardiology asked to see due to atrial fibrillation with RVR.   Assessment & Plan    1.  Permanent atrial fibrillation, focusing on heart rate control at this point.  Recent rates 80s to 100s at rest and she is tolerating Eliquis along with Cardizem CD 180 mg daily XL 25 mg daily.  2.  Acute on chronic diastolic heart failure.  She has continued on IV Lasix with more urine output last 24 hours.  Creatinine has bumped up somewhat from 0.91-1.03.  Will likely consider transitioning back to oral Demadex 60 mg daily tomorrow.  3.  Valvular heart disease status post bioprosthetic AVR with mitral valve annuloplasty and Maze procedure in 2014.  Recent follow-up echocardiogram is reviewed above with grossly normal valve function.  Continue Eliquis, Cardizem CD 180 mg daily, and Toprol-XL 25 mg daily.  Recent blood pressures have been stable, she may have further room for up titration with time.  Continue IV Lasix today and transition to Demadex 60 mg daily tomorrow.  Follow-up BMET in a.m.  Signed, Rozann Lesches, MD  08/08/2019, 9:44 AM

## 2019-08-09 DIAGNOSIS — J80 Acute respiratory distress syndrome: Secondary | ICD-10-CM | POA: Diagnosis not present

## 2019-08-09 DIAGNOSIS — R0602 Shortness of breath: Secondary | ICD-10-CM | POA: Diagnosis not present

## 2019-08-09 DIAGNOSIS — I5033 Acute on chronic diastolic (congestive) heart failure: Secondary | ICD-10-CM | POA: Diagnosis not present

## 2019-08-09 DIAGNOSIS — I5043 Acute on chronic combined systolic (congestive) and diastolic (congestive) heart failure: Secondary | ICD-10-CM | POA: Diagnosis not present

## 2019-08-09 DIAGNOSIS — Z20822 Contact with and (suspected) exposure to covid-19: Secondary | ICD-10-CM | POA: Diagnosis not present

## 2019-08-09 DIAGNOSIS — I350 Nonrheumatic aortic (valve) stenosis: Secondary | ICD-10-CM | POA: Diagnosis not present

## 2019-08-09 DIAGNOSIS — Z87891 Personal history of nicotine dependence: Secondary | ICD-10-CM | POA: Diagnosis not present

## 2019-08-09 DIAGNOSIS — E669 Obesity, unspecified: Secondary | ICD-10-CM | POA: Diagnosis not present

## 2019-08-09 DIAGNOSIS — E875 Hyperkalemia: Secondary | ICD-10-CM | POA: Diagnosis not present

## 2019-08-09 DIAGNOSIS — R5381 Other malaise: Secondary | ICD-10-CM | POA: Diagnosis not present

## 2019-08-09 DIAGNOSIS — I4891 Unspecified atrial fibrillation: Secondary | ICD-10-CM | POA: Diagnosis not present

## 2019-08-09 DIAGNOSIS — I959 Hypotension, unspecified: Secondary | ICD-10-CM | POA: Diagnosis not present

## 2019-08-09 DIAGNOSIS — I10 Essential (primary) hypertension: Secondary | ICD-10-CM | POA: Diagnosis not present

## 2019-08-09 DIAGNOSIS — F339 Major depressive disorder, recurrent, unspecified: Secondary | ICD-10-CM | POA: Diagnosis not present

## 2019-08-09 DIAGNOSIS — E559 Vitamin D deficiency, unspecified: Secondary | ICD-10-CM | POA: Diagnosis not present

## 2019-08-09 DIAGNOSIS — R0689 Other abnormalities of breathing: Secondary | ICD-10-CM | POA: Diagnosis not present

## 2019-08-09 DIAGNOSIS — I5032 Chronic diastolic (congestive) heart failure: Secondary | ICD-10-CM | POA: Diagnosis not present

## 2019-08-09 DIAGNOSIS — J449 Chronic obstructive pulmonary disease, unspecified: Secondary | ICD-10-CM | POA: Diagnosis not present

## 2019-08-09 DIAGNOSIS — Z7401 Bed confinement status: Secondary | ICD-10-CM | POA: Diagnosis not present

## 2019-08-09 DIAGNOSIS — R0902 Hypoxemia: Secondary | ICD-10-CM | POA: Diagnosis not present

## 2019-08-09 DIAGNOSIS — I480 Paroxysmal atrial fibrillation: Secondary | ICD-10-CM | POA: Diagnosis not present

## 2019-08-09 DIAGNOSIS — I4821 Permanent atrial fibrillation: Secondary | ICD-10-CM | POA: Diagnosis not present

## 2019-08-09 DIAGNOSIS — I509 Heart failure, unspecified: Secondary | ICD-10-CM | POA: Diagnosis not present

## 2019-08-09 DIAGNOSIS — F411 Generalized anxiety disorder: Secondary | ICD-10-CM | POA: Diagnosis not present

## 2019-08-09 DIAGNOSIS — J9601 Acute respiratory failure with hypoxia: Secondary | ICD-10-CM | POA: Diagnosis not present

## 2019-08-09 DIAGNOSIS — I499 Cardiac arrhythmia, unspecified: Secondary | ICD-10-CM | POA: Diagnosis not present

## 2019-08-09 DIAGNOSIS — E785 Hyperlipidemia, unspecified: Secondary | ICD-10-CM | POA: Diagnosis not present

## 2019-08-09 DIAGNOSIS — C9210 Chronic myeloid leukemia, BCR/ABL-positive, not having achieved remission: Secondary | ICD-10-CM | POA: Diagnosis not present

## 2019-08-09 DIAGNOSIS — I2510 Atherosclerotic heart disease of native coronary artery without angina pectoris: Secondary | ICD-10-CM | POA: Diagnosis not present

## 2019-08-09 DIAGNOSIS — F329 Major depressive disorder, single episode, unspecified: Secondary | ICD-10-CM | POA: Diagnosis not present

## 2019-08-09 LAB — BASIC METABOLIC PANEL
Anion gap: 11 (ref 5–15)
BUN: 21 mg/dL (ref 8–23)
CO2: 31 mmol/L (ref 22–32)
Calcium: 8.6 mg/dL — ABNORMAL LOW (ref 8.9–10.3)
Chloride: 99 mmol/L (ref 98–111)
Creatinine, Ser: 1.18 mg/dL — ABNORMAL HIGH (ref 0.44–1.00)
GFR calc Af Amer: 50 mL/min — ABNORMAL LOW (ref >60)
GFR calc non Af Amer: 43 mL/min — ABNORMAL LOW (ref >60)
Glucose, Bld: 107 mg/dL — ABNORMAL HIGH (ref 70–99)
Potassium: 3.5 mmol/L (ref 3.5–5.1)
Sodium: 141 mmol/L (ref 135–145)

## 2019-08-09 LAB — CBC
HCT: 31.2 % — ABNORMAL LOW (ref 36.0–46.0)
Hemoglobin: 9.1 g/dL — ABNORMAL LOW (ref 12.0–15.0)
MCH: 27.2 pg (ref 26.0–34.0)
MCHC: 29.2 g/dL — ABNORMAL LOW (ref 30.0–36.0)
MCV: 93.1 fL (ref 80.0–100.0)
Platelets: 1614 10*3/uL (ref 150–400)
RBC: 3.35 MIL/uL — ABNORMAL LOW (ref 3.87–5.11)
RDW: 18.1 % — ABNORMAL HIGH (ref 11.5–15.5)
WBC: 8.5 10*3/uL (ref 4.0–10.5)
nRBC: 0 % (ref 0.0–0.2)

## 2019-08-09 LAB — MAGNESIUM: Magnesium: 2.3 mg/dL (ref 1.7–2.4)

## 2019-08-09 MED ORDER — TORSEMIDE 20 MG PO TABS: 60.0000 mg | ORAL_TABLET | Freq: Every day | ORAL | 0 refills | Status: DC

## 2019-08-09 MED ORDER — BUDESONIDE 0.5 MG/2ML IN SUSP: 0.5000 mg | Freq: Two times a day (BID) | RESPIRATORY_TRACT | 12 refills | Status: AC

## 2019-08-09 MED ORDER — ARFORMOTEROL TARTRATE 15 MCG/2ML IN NEBU: 15.0000 ug | INHALATION_SOLUTION | Freq: Two times a day (BID) | RESPIRATORY_TRACT | 3 refills | Status: DC

## 2019-08-09 MED ORDER — ENSURE ENLIVE PO LIQD: 237.0000 mL | ORAL | 12 refills | Status: DC

## 2019-08-09 MED ORDER — DILTIAZEM HCL ER COATED BEADS 180 MG PO CP24: 180.0000 mg | ORAL_CAPSULE | Freq: Every day | ORAL | 0 refills | Status: DC

## 2019-08-09 MED ORDER — METOPROLOL SUCCINATE ER 25 MG PO TB24: 25.0000 mg | ORAL_TABLET | Freq: Every day | ORAL | 0 refills | Status: DC

## 2019-08-09 MED ORDER — DOCUSATE SODIUM 100 MG PO CAPS: 200.0000 mg | ORAL_CAPSULE | Freq: Every day | ORAL | 0 refills | Status: AC

## 2019-08-09 MED ORDER — ALPRAZOLAM 0.5 MG PO TABS: 0.5000 mg | ORAL_TABLET | Freq: Three times a day (TID) | ORAL | 0 refills | Status: DC | PRN

## 2019-08-09 MED ORDER — BUDESONIDE 0.5 MG/2ML IN SUSP: 0.5000 mg | Freq: Two times a day (BID) | RESPIRATORY_TRACT | 12 refills | Status: DC

## 2019-08-09 MED ORDER — ARFORMOTEROL TARTRATE 15 MCG/2ML IN NEBU: 15.0000 ug | INHALATION_SOLUTION | Freq: Two times a day (BID) | RESPIRATORY_TRACT | 3 refills | Status: AC

## 2019-08-09 MED ORDER — POLYETHYLENE GLYCOL 3350 17 G PO PACK: 17.0000 g | PACK | Freq: Every day | ORAL | 0 refills | Status: AC | PRN

## 2019-08-09 MED ORDER — BUSPIRONE HCL 10 MG PO TABS: 10.0000 mg | ORAL_TABLET | Freq: Two times a day (BID) | ORAL | 0 refills | Status: AC

## 2019-08-09 NOTE — Progress Notes (Addendum)
Progress Note  Patient Name: Nicole [name] Date of Encounter: 08/09/2019  Primary Cardiologist: [name], MD   Subjective   Breathing at baseline. No chest pain or palpitations. Per her report, she is planning to go to SNF later today for rehab.   Inpatient Medications    Scheduled Meds: . apixaban  5 mg Oral BID  . arformoterol  15 mcg Nebulization BID  . atorvastatin  40 mg Oral q1800  . budesonide (PULMICORT) nebulizer solution  0.5 mg Nebulization BID  . busPIRone  10 mg Oral BID  . diltiazem  180 mg Oral Daily  . docusate sodium  200 mg Oral QHS  . DULoxetine  20 mg Oral Daily  . feeding supplement (ENSURE ENLIVE)  237 mL Oral Q24H  . ferrous sulfate  325 mg Oral Q breakfast  . imatinib  100 mg Oral Q breakfast  . mouth rinse  15 mL Mouth Rinse BID  . metoprolol succinate  25 mg Oral Daily  . multivitamin with minerals  1 tablet Oral Daily  . torsemide  60 mg Oral Daily    PRN Meds: acetaminophen, albuterol, ALPRAZolam, ondansetron (ZOFRAN) IV, polyethylene glycol, prochlorperazine   Vital Signs    Vitals:   08/08/19 2131 08/08/19 2133 08/09/19 0309 08/09/19 0433  BP: 92/64   101/73  Pulse: 88   65  Resp: 16   18  Temp: 98.2 F (36.8 C)   97.7 F (36.5 C)  TempSrc: Oral   Oral  SpO2: 97% 96%  96%  Weight:   85.7 kg   Height:        Intake/Output Summary (Last 24 hours) at 08/09/2019 0858 Last data filed at 08/09/2019 0435 Gross per 24 hour  Intake --  Output 300 ml  Net -300 ml    Last 3 Weights 08/09/2019 08/07/2019 08/04/2019  Weight (lbs) 188 lb 15 oz 192 lb 7.4 oz 187 lb 13.3 oz  Weight (kg) 85.7 kg 87.3 kg 85.2 kg      Telemetry    Atrial fibrillation, HR in 80's to low-100's with occasional episodes of HR into the 110's. Occasional PVC's.  - Personally Reviewed  ECG    No new tracings.   Physical Exam   General: Elderly Caucasian female appearing in no acute distress. Head: Normocephalic, atraumatic.  Neck: Supple without bruits,  JVD not elevated. Lungs:  Resp regular and unlabored, CTA without wheezing or rales. Heart: Irregularly irregular, S1, S2, no S3, S4, or murmur; no rub. Abdomen: Soft, non-tender, non-distended with normoactive bowel sounds. No hepatomegaly. No rebound/guarding. No obvious abdominal masses. Extremities: No clubbing, cyanosis, or edema. Distal pedal pulses are 2+ bilaterally. Neuro: Alert and oriented X 3. Moves all extremities spontaneously. Psych: Normal affect.  Labs    Chemistry Recent Labs  Lab 08/07/19 0514 08/08/19 0550 08/09/19 0508  NA 140 141 141  K 3.7 4.1 3.5  CL 100 102 99  CO2 30 31 31   GLUCOSE 147* 123* 107*  BUN 20 22 21   CREATININE 0.91 1.03* 1.18*  CALCIUM 9.0 9.0 8.6*  GFRNONAA 59* 51* 43*  GFRAA >60 59* 50*  ANIONGAP 10 8 11      Hematology Recent Labs  Lab 08/07/19 0514 08/08/19 0550 08/09/19 0508  WBC 12.9* 9.4 8.5  RBC 3.56* 3.34* 3.35*  HGB 9.5* 9.1* 9.1*  HCT 32.1* 30.3* 31.2*  MCV 90.2 90.7 93.1  MCH 26.7 27.2 27.2  MCHC 29.6* 30.0 29.2*  RDW 17.5* 18.0* 18.1*  PLT 1,744* 1,593* 1,614*  Radiology    No results found.  Cardiac Studies   Echocardiogram: 07/2019 IMPRESSIONS    1. Left ventricular ejection fraction, by estimation, is 60 to 65%. The  left ventricle has normal function. The left ventricle has no regional  wall motion abnormalities. There is mild There is basal septal hypertrophy  left ventricular hypertrophy. Left  ventricular diastolic parameters are indeterminate.  2. Right ventricular systolic function is low normal. The right  ventricular size is mildly enlarged. There is moderately elevated  pulmonary artery systolic pressure.  3. Left atrial size was severely dilated.  4. Right atrial size was severely dilated.  5. 26 mm Sorin 3-D Memo Ring is in the MV anular position. Mild mean  gradient across the repaired valve of 6 mmHg. . The mitral valve has been  repaired/replaced. No evidence of mitral valve  regurgitation.  6. 21 mm Edwards Magna-ease pericardial valve is in the AV position.  Average mean gradient is measured at 24 mmHg (within range of normal  values for valve) The anatomy of the AV is poorly visualized. . The aortic  valve has been repaired/replaced. Aortic  valve regurgitation is not visualized.   Patient Profile     84 y.o. female w/ PMH of permanent atrial fibrillation (failed prior DCCV, developed hyperthyroidism and junctional rhythm with Amiodarone in the past--> rate-control strategy pursued), valvular heart disease (s/p tissue AVR in 2014 with MV ring and maze procedure), chronic diastolic CHF, COPD, hyperthyroidism, HLD and CML with thrombocytosiswho iscurrently admitted for acute hypoxic respiratory failure in the setting of HCAP and acute CHF exacerbation. Cardiology asked to see due to atrial fibrillation with RVR.  Assessment & Plan    1. Atrial Fibrillation with RVR -She has permanent atrial fibrillation and rate-control strategy has been pursued. She has restarted on oral medications and is on Cardizem CD 180mg  daily and Toprol-XL 25mg  daily (previously intolerant to BID dosing). Would not further titrate given low-normal BP (at 92/64 - 101/73 within the past 24 hours) - remains on Eliquis 5mg  BID for anticoagulation. Hgb stable at 9.1.  2. Acute on Chronic Diastolic CHF Exacerbation - she diuresed well with IV Lasix and while I&O's have not been recorded, weight peaked at 194 lbs this admission and was down to 188 lbs on most recent check. Transitioned to Torsemide 60mg  daily starting today as creatinine had started to trend upwards. Creatinine 1.18 this AM and she did receive IV Lasix yesterday. Would recommend she have a repeat BMET in 1 week at SNF for reassessment.   3. ValvularHeartDisease -s/p tissue AVR in 2014 with MV ring and maze procedure. Echo this admission with no acute changes. Continue to follow as an outpatient.   4. AKI -  creatinine peaked at 1.66 this admission on 07/30/2019. At 1.18 this AM.   5. COPD Exacerbation - being followed by Pulmonology this admission.   6.CML with thrombocytosis and anemia - Hgb stable at 9.1 this AM and platelets at 1614 K. Followed by Oncology.    Given that she is being discharged to SNF, will arrange for a 2-3 week Hospital Follow-up telehealth visit and include in her AVS.    For questions or updates, please contact [place] Please consult [email or web address] for contact info under Cardiology/STEMI.   Arna Medici , PA-C 8:58 AM 08/09/2019 Pager: [phone]   Attending note:  Patient seen and examined.  Reviewed interval hospital course and discussed the case with Ms. Ahmed Prima PA-C.  I agree with her above  findings. Breathing has improved, patient anticipates discharge to SNF later today for further rehabilitation.  She continues on Eliquis for stroke prophylaxis, also combination of Cardizem CD and Toprol-XL for heart rate control.  Further up titration is limited by blood pressure.  She is asymptomatic in terms of palpitations.  She is tolerating oral Demadex which was resumed at 60 mg daily.  Creatinine 1.18 and potassium 3.5.  No changes made to current medications.  We will arrange a follow-up encounter in the next 2 to 3 weeks, she should have a BMET in about 1 week at [facility] for follow-up of renal function.  Satira Sark, M.D., F.A.C.C.

## 2019-08-09 NOTE — Discharge Instructions (Signed)

## 2019-08-09 NOTE — TOC Transition Note (Signed)
Transition of Care [facility]) - CM/SW Discharge Note   Patient Details  Name: Nicole [name] MRN: [id number] Date of Birth: 10-Jan-1936  [facility] CM/SW Contact:  Ihor Gully, LCSW Phone Number: 08/09/2019, 1:08 PM   Clinical Narrative:    Nurse, Silva Bandy, at facility notified of discharge. Discharge clinicals faxed to [phone]. TOC signing off.    Final next level of care: [place] Barriers to Discharge: No Barriers Identified   Patient Goals and CMS Choice Patient states their goals for this hospitalization and ongoing recovery are:: to go to SNF, then return to apartment. CMS Medicare.gov Compare Post Acute Care list provided to:: Patient Choice offered to / list presented to : Patient  Discharge Placement              Patient chooses bed at: [facility] Patient to be transferred to facility by: [place] Name of family member notified: Dtr., [name] Patient and family notified of of transfer: 08/09/19  Discharge Plan and Services                                     Social Determinants of Health (SDOH) Interventions     Readmission Risk Interventions No flowsheet data found.

## 2019-08-09 NOTE — Progress Notes (Signed)
0720: lab reported critical platelets, MD contacted, awaiting orders

## 2019-08-09 NOTE — Discharge Summary (Signed)
Physician Discharge Summary  Nicole [name] DUK:[id number] DOB: 08-22-1935 DOA: 07/27/2019  PCP: Frances Maywood, FNP  Admit date: 07/27/2019  Discharge date: 08/09/2019  Admitted From:Home  Disposition:  SNF  Recommendations for Outpatient Follow-up:  1. Follow up with PCP in 1-2 weeks 2. Repeat be met in 1 week to ensure stable creatinine levels 3. Follow-up with Dr. Delton Coombes oncology in 1 week as scheduled 4. Follow-up with cardiology as scheduled in the next 2-3 weeks and continue medications as noted below 5. Follow-up with endocrinology as recommended for evaluation of hyperthyroidism/goiter  Home Health: None  Equipment/Devices: Nasal cannula oxygen  Discharge Condition: Stable  CODE STATUS: DNR  Diet recommendation: Heart Healthy  Brief/Interim Summary: Per HPI: Nicole [name] a 84 y.o.femalewith a history of COPD, diastolic heart failure, atrial fibrillation on anticoagulation, CML with thrombocytosis. She was recently admitted about a month ago for similar complaints. She presented with Afib and RVR. She did complete doxycycline for total of 5 days for bronchitis/bronchiectasis. On day of admission she presented with 1 week of worsening shortness of breath which has been worsening and is now severe. Symptoms are worse with ambulation and improved with rest. No other palliating or provoking factors. She had been prescribed doxycycline and prednisone by her oncology physician, although she has not been get these medications.  3/10: Patient appears to be doing okay this morning with stable heart rate and blood pressure control noted.  Cardiology with plans to continue IV Lasix through today and then transition to oral by tomorrow.  Patient has been started on [place] 3/9 and will receive her afternoon dose later today.  3/11: Patient had a prolonged hospitalization stay due to hypoxemic respiratory failure secondary to HCAP along with pulmonary vascular congestion  with acute on chronic diastolic heart failure.  She has completed course of cefepime and receives bronchodilators as needed.  She is stable from a pulmonology standpoint.  She was also noted to have A. fib with RVR and soft blood pressure readings and was seen by cardiology as well and has been transitioned to oral Cardizem as well as Toprol-XL with Eliquis for anticoagulation.  She has also been seen by oncology on account of her CML with thrombocytosis and has been started on her Gray.  She will need close follow-up with cardiology as well as oncology in the near future and will need to remain on Pulmicort and Brovana for her COPD as well.  She is also noted to have a goiter with signs of hyperthyroidism and will need endocrinology follow-up in the outpatient setting.  Discharge Diagnoses:  Active Problems:   CAD (coronary artery disease)   Atrial fibrillation (HCC)   Chronic diastolic CHF (congestive heart failure) (HCC)   Aortic stenosis   Thrombocytosis (HCC)   COPD exacerbation (HCC)   CML (chronic myelocytic leukemia) ([place])   HCAP (healthcare-associated pneumonia)   Acute respiratory failure with hypoxia (HCC)   DNR (do not resuscitate)   Pleural effusion   Sepsis ([place])   Acute exacerbation of CHF (congestive heart failure) ([place])  Principal discharge diagnosis: Acute hypoxemic respiratory failure secondary to HCAP with associated COPD exacerbation along with acute on chronic diastolic CHF exacerbation.  Atrial fibrillation with RVR.  Discharge Instructions  Discharge Instructions    Diet - low sodium heart healthy   Complete by: As directed    Increase activity slowly   Complete by: As directed      Allergies as of 08/09/2019   No Known Allergies  Medication List    STOP taking these medications   Aleve 220 MG tablet Generic drug: naproxen sodium     TAKE these medications   albuterol 108 (90 Base) MCG/ACT inhaler Commonly known as: VENTOLIN HFA Inhale 2 puffs  into the lungs as needed.   ALPRAZolam 0.5 MG tablet Commonly known as: XANAX Take 1 tablet (0.5 mg total) by mouth 3 (three) times daily as needed for anxiety.   arformoterol 15 MCG/2ML Nebu Commonly known as: BROVANA Take 2 mLs (15 mcg total) by nebulization 2 (two) times daily.   atorvastatin 40 MG tablet Commonly known as: LIPITOR TAKE 1 TABLET BY MOUTH EVERY DAY IN THE EVENING   Breo Ellipta 100-25 MCG/INH Aepb Generic drug: fluticasone furoate-vilanterol Inhale 1 puff into the lungs daily.   budesonide 0.5 MG/2ML nebulizer solution Commonly known as: PULMICORT Take 2 mLs (0.5 mg total) by nebulization 2 (two) times daily.   busPIRone 10 MG tablet Commonly known as: BUSPAR Take 1 tablet (10 mg total) by mouth 2 (two) times daily. What changed:   when to take this  reasons to take this   diltiazem 180 MG 24 hr capsule Commonly known as: CARDIZEM CD Take 1 capsule (180 mg total) by mouth daily. Start taking on: August 10, 2019 What changed:   medication strength  how much to take   docusate sodium 100 MG capsule Commonly known as: COLACE Take 2 capsules (200 mg total) by mouth at bedtime.   DULoxetine 20 MG capsule Commonly known as: CYMBALTA Take 1 capsule (20 mg total) by mouth daily.   Eliquis 5 MG Tabs tablet Generic drug: apixaban TAKE 1 TABLET BY MOUTH TWICE A DAY   ergocalciferol 1.25 MG (50000 UT) capsule Commonly known as: VITAMIN D2 Take 1 capsule (50,000 Units total) by mouth once a week.   feeding supplement (ENSURE ENLIVE) Liqd Take 237 mLs by mouth daily.   ferrous sulfate 325 (65 FE) MG EC tablet Take 325 mg by mouth daily with breakfast.   imatinib 100 MG tablet Commonly known as: GLEEVEC Take 1 tablet (100 mg total) by mouth daily. Take with meals and large glass of water.Caution:Chemotherapy   metoprolol succinate 25 MG 24 hr tablet Commonly known as: TOPROL-XL Take 1 tablet (25 mg total) by mouth daily. Start taking on: August 10, 2019 What changed: when to take this   multivitamin with minerals tablet Take 1 tablet by mouth daily.   polyethylene glycol 17 g packet Commonly known as: MIRALAX / GLYCOLAX Take 17 g by mouth daily as needed for mild constipation.   potassium chloride SA 20 MEQ tablet Commonly known as: Klor-Con M20 Take 2 tablets (40 mEq total) by mouth 2 (two) times daily.   prochlorperazine 10 MG tablet Commonly known as: COMPAZINE Take 1 tablet (10 mg total) by mouth every 6 (six) hours as needed for nausea or vomiting.   torsemide 20 MG tablet Commonly known as: DEMADEX Take 3 tablets (60 mg total) by mouth daily. Start taking on: August 10, 2019 What changed:   how much to take  how to take this  when to take this  additional instructions      Follow-up Information    Erma Heritage, PA-C Follow up on 08/28/2019.   Specialties: Physician Assistant, Cardiology Why: Cardiology Follow-up on 08/28/2019 at 1:30 PM. Will be a PHONE VISIT.  Contact information: [place] [address] [phone]        Frances Maywood, FNP  Follow up in 1 week(s).   Specialty: Family Medicine Contact information: [address] [address] [phone]        Derek Jack, MD Follow up in 1 week(s).   Specialty: Hematology Contact information: [place] [address] [phone]          No Known Allergies  Consultations: PCCM Cardiology Oncology  Procedures/Studies: DG Chest 1 View  Result Date: 07/28/2019 CLINICAL DATA:  Dyspnea. Large epigastric hernia. EXAM: CHEST  1 VIEW COMPARISON:  07/27/2019 FINDINGS: Signs of median sternotomy and aortic valve replacement with left atrial clipping. Marked cardiomegaly. Low lung volumes. Blunting of right costodiaphragmatic sulcus. Left and right hemidiaphragm are obscured. Study limited by patient body habitus. No acute bone finding. IMPRESSION: Low lung volumes with bibasilar  atelectasis and/or infiltrates. Marked cardiomegaly without signs of failure but with potential pulmonary vascular congestion. Signs of aortic valve replacement and left atrial clipping. Electronically Signed   By: Zetta Bills M.D.   On: 07/28/2019 18:02   DG Chest 2 View  Result Date: 07/31/2019 CLINICAL DATA:  Severe shortness of breath, COPD, CHF, former smoker, atrial fibrillation EXAM: CHEST - 2 VIEW COMPARISON:  07/28/2019 FINDINGS: Enlargement of cardiac silhouette post median sternotomy, MVR, and LEFT atrial appendage clipping. Mediastinal contours and pulmonary vascularity normal. Atherosclerotic calcification aorta. RIGHT pleural effusion and basilar atelectasis slightly increased. Remaining lungs clear. No pleural effusion or pneumothorax. Bones demineralized. IMPRESSION: Enlargement of cardiac silhouette post surgical changes. Increased RIGHT basilar atelectasis and pleural effusion. Electronically Signed   By: Lavonia Dana M.D.   On: 07/31/2019 09:44   DG CHEST PORT 1 VIEW  Result Date: 08/06/2019 CLINICAL DATA:  Followup pleural effusion EXAM: PORTABLE CHEST 1 VIEW COMPARISON:  08/04/2019 and older exams FINDINGS: Moderate right pleural effusion obscures the right hemidiaphragm without significant change from the previous exam. Mild vascular interstitial prominence is stable. No new lung abnormalities. Changes from previous cardiac surgery are stable. Cardiac silhouette is enlarged. IMPRESSION: 1. No significant change from the most recent prior study. 2. Moderate right pleural effusion. Vascular congestion and mild interstitial prominence without overt pulmonary edema. Electronically Signed   By: Lajean Manes M.D.   On: 08/06/2019 05:18   DG CHEST PORT 1 VIEW  Result Date: 08/04/2019 CLINICAL DATA:  Shortness of breath. EXAM: PORTABLE CHEST 1 VIEW COMPARISON:  08/02/2019 FINDINGS: Stable enlarged cardiac silhouette, prosthetic aortic and mitral valves, mediastinal surgical clips and left  atrial appendage clip. Moderate-sized right pleural effusion with a mild increase in amount. Mild increase in associated right basilar atelectasis or pneumonia. Decreased prominence of the pulmonary vasculature and interstitial markings. Diffuse osteopenia. IMPRESSION: 1. Mild increase in size of the moderate-sized right pleural effusion and right basilar atelectasis or pneumonia. 2. Stable cardiomegaly with improved pulmonary vascular congestion and interstitial pulmonary edema. Electronically Signed   By: Claudie Revering M.D.   On: 08/04/2019 11:34   DG CHEST PORT 1 VIEW  Result Date: 08/02/2019 CLINICAL DATA:  CHF EXAM: PORTABLE CHEST 1 VIEW COMPARISON:  August 01, 2019 FINDINGS: The heart size and mediastinal contours are unchanged with cardiomegaly. Aortic knob calcifications. Again noted is prominence of the central pulmonary vasculature. Small bilateral pleural effusions are again noted, right greater than left. IMPRESSION: No significant interval change in pulmonary vascular congestion and small bilateral pleural effusions. Electronically Signed   By: Prudencio Pair M.D.   On: 08/02/2019 04:51   DG Chest Port 1 View  Result Date: 08/01/2019 CLINICAL DATA:  Severe  shortness of breath. EXAM: PORTABLE CHEST 1 VIEW COMPARISON:  07/31/2019 FINDINGS: Cardiac enlargement. Status post median sternotomy and mitral valve repair. Unchanged moderate right pleural effusion and pulmonary vascular congestion. Atelectasis versus infiltrate noted in the right lung base. IMPRESSION: No change in CHF pattern. Electronically Signed   By: Kerby Moors M.D.   On: 08/01/2019 12:10   DG Chest Port 1 View  Result Date: 07/27/2019 CLINICAL DATA:  Shortness of breath and wheezing EXAM: PORTABLE CHEST 1 VIEW COMPARISON:  06/24/2019, 02/09/2017 FINDINGS: Post sternotomy changes. Appendage clip. Trace pleural effusions. Enlarged cardiomediastinal silhouette with central vascular congestion. Increased hazy opacity at the right base.  Aortic atherosclerosis. No pneumothorax. IMPRESSION: 1. Cardiomegaly with mild central congestion and trace pleural effusions. 2. Slight interval increase in hazy right basilar opacity, atelectasis versus mild pneumonia. Electronically Signed   By: Donavan Foil M.D.   On: 07/27/2019 15:44   ECHOCARDIOGRAM COMPLETE  Result Date: 08/02/2019    ECHOCARDIOGRAM REPORT   Patient Name:   Nicole [name] Date of Exam: 08/02/2019 Medical Rec #:  [id number]   Height:       63.0 in Accession #:    [id number]  Weight:       191.8 lb Date of Birth:  1936/04/08   BSA:          1.900 m Patient Age:    45 years    BP:           110/84 mmHg Patient Gender: F           HR:           95 bpm. Exam Location:  [facility] Procedure: 2D Echo, Cardiac Doppler and Color Doppler Indications:    Dyspnea 786.09 / R06.00  History:        Patient has prior history of Echocardiogram examinations, most                 recent 06/25/2019. CHF, CAD, COPD, Aortic Valve Disease and                 Mitral Valve Disease, Arrythmias:Atrial Fibrillation; Risk                 Factors:Dyslipidemia. Acute respiratory failure with hypoxia,                 s/p tissue AVR in 2014 with MV ring and maze procedure.  Sonographer:    Alvino Chapel RCS Referring Phys: [id number] [place]  1. Left ventricular ejection fraction, by estimation, is 60 to 65%. The left ventricle has normal function. The left ventricle has no regional wall motion abnormalities. There is mild There is basal septal hypertrophy left ventricular hypertrophy. Left ventricular diastolic parameters are indeterminate.  2. Right ventricular systolic function is low normal. The right ventricular size is mildly enlarged. There is moderately elevated pulmonary artery systolic pressure.  3. Left atrial size was severely dilated.  4. Right atrial size was severely dilated.  5. 26 mm Sorin 3-D Memo Ring is in the MV anular position. Mild mean gradient across the repaired valve of 6 mmHg. .  The mitral valve has been repaired/replaced. No evidence of mitral valve regurgitation.  6. 21 mm Edwards Magna-ease pericardial valve is in the AV position. Average mean gradient is measured at 24 mmHg (within range of normal values for valve) The anatomy of the AV is poorly visualized. . The aortic valve has been repaired/replaced. Aortic  valve regurgitation is not visualized.  FINDINGS  Left Ventricle: Left ventricular ejection fraction, by estimation, is 60 to 65%. The left ventricle has normal function. The left ventricle has no regional wall motion abnormalities. The left ventricular internal cavity size was normal in size. There is  mild There is basal septal hypertrophy left ventricular hypertrophy. Left ventricular diastolic parameters are indeterminate. Right Ventricle: The right ventricular size is mildly enlarged. Right vetricular wall thickness was not assessed. Right ventricular systolic function is low normal. There is moderately elevated pulmonary artery systolic pressure. The tricuspid regurgitant velocity is 3.09 m/s, and with an assumed right atrial pressure of 10 mmHg, the estimated right ventricular systolic pressure is 63.8 mmHg. Left Atrium: Left atrial size was severely dilated. Right Atrium: Right atrial size was severely dilated. Pericardium: There is no evidence of pericardial effusion. Mitral Valve: 26 mm Sorin 3-D Memo Ring is in the MV anular position. Mild mean gradient across the repaired valve of 6 mmHg. The mitral valve has been repaired/replaced. No evidence of mitral valve regurgitation. MV peak gradient, 25.5 mmHg. The mean mitral valve gradient is 7.5 mmHg. Tricuspid Valve: The tricuspid valve is normal in structure. Tricuspid valve regurgitation is mild. Aortic Valve: 21 mm Edwards Magna-ease pericardial valve is in the AV position. Average mean gradient is measured at 24 mmHg (within range of normal values for valve) The anatomy of the AV is poorly visualized. The aortic valve  has been repaired/replaced. Aortic valve regurgitation is not visualized. Aortic valve mean gradient measures 24.3 mmHg. Aortic valve peak gradient measures 42.3 mmHg. Aortic valve area, by VTI measures 1.01 cm. Pulmonic Valve: The pulmonic valve was not well visualized. Pulmonic valve regurgitation is not visualized. No evidence of pulmonic stenosis. Aorta: The aortic root is normal in size and structure. IAS/Shunts: No atrial level shunt detected by color flow Doppler.  LEFT VENTRICLE PLAX 2D LVIDd:         3.83 cm LVIDs:         2.36 cm LV PW:         1.05 cm LV IVS:        1.24 cm LVOT diam:     1.80 cm LV SV:         64 LV SV Index:   34 LVOT Area:     2.54 cm  LEFT ATRIUM             Index       RIGHT ATRIUM           Index LA diam:        4.50 cm 2.37 cm/m  RA Area:     30.05 cm LA Vol (A2C):   83.5 ml 43.96 ml/m RA Volume:   113.65 ml 59.83 ml/m LA Vol (A4C):   85.7 ml 45.12 ml/m LA Biplane Vol: 89.7 ml 47.22 ml/m  AORTIC VALVE AV Area (Vmax):    0.94 cm AV Area (Vmean):   0.98 cm AV Area (VTI):     1.01 cm AV Vmax:           325.28 cm/s AV Vmean:          229.761 cm/s AV VTI:            0.630 m AV Peak Grad:      42.3 mmHg AV Mean Grad:      24.3 mmHg LVOT Vmax:         120.50 cm/s LVOT Vmean:        88.450 cm/s LVOT VTI:  0.250 m LVOT/AV VTI ratio: 0.40  AORTA Ao Root diam: 2.90 cm MITRAL VALVE                TRICUSPID VALVE MV Area (PHT): 2.11 cm     TR Peak grad:   38.2 mmHg MV Peak grad:  25.5 mmHg    TR Vmax:        309.00 cm/s MV Mean grad:  7.5 mmHg MV Vmax:       2.53 m/s     SHUNTS MV Vmean:      112.7 cm/s   Systemic VTI:  0.25 m MV Decel Time: 359 msec     Systemic Diam: 1.80 cm MV E velocity: 211.00 cm/s Carlyle Dolly MD Electronically signed by Carlyle Dolly MD Signature Date/Time: 08/02/2019/4:00:55 PM    Final      Discharge Exam: Vitals:   08/09/19 0433 08/09/19 0903  BP: 101/73   Pulse: 65   Resp: 18   Temp: 97.7 F (36.5 C)   SpO2: 96% 97%   Vitals:    08/08/19 2133 08/09/19 0309 08/09/19 0433 08/09/19 0903  BP:   101/73   Pulse:   65   Resp:   18   Temp:   97.7 F (36.5 C)   TempSrc:   Oral   SpO2: 96%  96% 97%  Weight:  85.7 kg    Height:        General: Pt is alert, awake, not in acute distress Cardiovascular: RRR, S1/S2 +, no rubs, no gallops Respiratory: CTA bilaterally, no wheezing, no rhonchi, currently on 2-3 L nasal cannula oxygen Abdominal: Soft, NT, ND, bowel sounds + Extremities: no edema, no cyanosis    The results of significant diagnostics from this hospitalization (including imaging, microbiology, ancillary and laboratory) are listed below for reference.     Microbiology: Recent Results (from the past 240 hour(s))  SARS CORONAVIRUS 2 (TAT 6-24 HRS) Nasopharyngeal Nasopharyngeal Swab     Status: None   Collection Time: 08/01/19  4:45 AM   Specimen: Nasopharyngeal Swab  Result Value Ref Range Status   SARS Coronavirus 2 NEGATIVE NEGATIVE Final    Comment: (NOTE) SARS-CoV-2 target nucleic acids are NOT DETECTED. The SARS-CoV-2 RNA is generally detectable in upper and lower respiratory specimens during the acute phase of infection. Negative results do not preclude SARS-CoV-2 infection, do not rule out co-infections with other pathogens, and should not be used as the sole basis for treatment or other patient management decisions. Negative results must be combined with clinical observations, patient history, and epidemiological information. The expected result is Negative. Fact Sheet for Patients: SugarRoll.be Fact Sheet for Healthcare Providers: https://www.woods-mathews.com/ This test is not yet approved or cleared by the [place] FDA and  has been authorized for detection and/or diagnosis of SARS-CoV-2 by FDA under an Emergency Use Authorization (EUA). This EUA will remain  in effect (meaning this test can be used) for the duration of the COVID-19 declaration  under Section 56 4(b)(1) of the Act, 21 U.S.C. section 360bbb-3(b)(1), unless the authorization is terminated or revoked sooner. Performed at [facility], [place] [address] [place], [place] [address]   SARS CORONAVIRUS 2 (TAT 6-24 HRS) Nasopharyngeal Nasopharyngeal Swab     Status: None   Collection Time: 08/02/19  2:56 PM   Specimen: Nasopharyngeal Swab  Result Value Ref Range Status   SARS Coronavirus 2 NEGATIVE NEGATIVE Final    Comment: (NOTE) SARS-CoV-2 target nucleic acids are NOT DETECTED. The SARS-CoV-2 RNA  is generally detectable in upper and lower respiratory specimens during the acute phase of infection. Negative results do not preclude SARS-CoV-2 infection, do not rule out co-infections with other pathogens, and should not be used as the sole basis for treatment or other patient management decisions. Negative results must be combined with clinical observations, patient history, and epidemiological information. The expected result is Negative. Fact Sheet for Patients: SugarRoll.be Fact Sheet for Healthcare Providers: https://www.woods-mathews.com/ This test is not yet approved or cleared by the [place] FDA and  has been authorized for detection and/or diagnosis of SARS-CoV-2 by FDA under an Emergency Use Authorization (EUA). This EUA will remain  in effect (meaning this test can be used) for the duration of the COVID-19 declaration under Section 56 4(b)(1) of the Act, 21 U.S.C. section 360bbb-3(b)(1), unless the authorization is terminated or revoked sooner. Performed at [facility], [place] [address], [place], [place] [address]   SARS CORONAVIRUS 2 (TAT 6-24 HRS) Nasopharyngeal Nasopharyngeal Swab     Status: None   Collection Time: 08/05/19  5:23 PM   Specimen: Nasopharyngeal Swab  Result Value Ref Range Status   SARS Coronavirus 2 NEGATIVE NEGATIVE Final    Comment: (NOTE) SARS-CoV-2 target nucleic acids are  NOT DETECTED. The SARS-CoV-2 RNA is generally detectable in upper and lower respiratory specimens during the acute phase of infection. Negative results do not preclude SARS-CoV-2 infection, do not rule out co-infections with other pathogens, and should not be used as the sole basis for treatment or other patient management decisions. Negative results must be combined with clinical observations, patient history, and epidemiological information. The expected result is Negative. Fact Sheet for Patients: SugarRoll.be Fact Sheet for Healthcare Providers: https://www.woods-mathews.com/ This test is not yet approved or cleared by the [place] FDA and  has been authorized for detection and/or diagnosis of SARS-CoV-2 by FDA under an Emergency Use Authorization (EUA). This EUA will remain  in effect (meaning this test can be used) for the duration of the COVID-19 declaration under Section 56 4(b)(1) of the Act, 21 U.S.C. section 360bbb-3(b)(1), unless the authorization is terminated or revoked sooner. Performed at [facility], [place] [address], [place], [place] [address]   Respiratory Panel by RT PCR (Flu A&B, Covid) - Nasopharyngeal Swab     Status: None   Collection Time: 08/08/19 10:33 AM   Specimen: Nasopharyngeal Swab  Result Value Ref Range Status   SARS Coronavirus 2 by RT PCR NEGATIVE NEGATIVE Final    Comment: (NOTE) SARS-CoV-2 target nucleic acids are NOT DETECTED. The SARS-CoV-2 RNA is generally detectable in upper respiratoy specimens during the acute phase of infection. The lowest concentration of SARS-CoV-2 viral copies this assay can detect is 131 copies/mL. A negative result does not preclude SARS-Cov-2 infection and should not be used as the sole basis for treatment or other patient management decisions. A negative result may occur with  improper specimen collection/handling, submission of specimen other than nasopharyngeal  swab, presence of viral mutation(s) within the areas targeted by this assay, and inadequate number of viral copies (<131 copies/mL). A negative result must be combined with clinical observations, patient history, and epidemiological information. The expected result is Negative. Fact Sheet for Patients:  PinkCheek.be Fact Sheet for Healthcare Providers:  GravelBags.it This test is not yet ap proved or cleared by the [place] FDA and  has been authorized for detection and/or diagnosis of SARS-CoV-2 by FDA under an Emergency Use Authorization (EUA). This EUA will remain  in  effect (meaning this test can be used) for the duration of the COVID-19 declaration under Section 564(b)(1) of the Act, 21 U.S.C. section 360bbb-3(b)(1), unless the authorization is terminated or revoked sooner.    Influenza A by PCR NEGATIVE NEGATIVE Final   Influenza B by PCR NEGATIVE NEGATIVE Final    Comment: (NOTE) The Xpert Xpress SARS-CoV-2/FLU/RSV assay is intended as an aid in  the diagnosis of influenza from Nasopharyngeal swab specimens and  should not be used as a sole basis for treatment. Nasal washings and  aspirates are unacceptable for Xpert Xpress SARS-CoV-2/FLU/RSV  testing. Fact Sheet for Patients: PinkCheek.be Fact Sheet for Healthcare Providers: GravelBags.it This test is not yet approved or cleared by the [place] FDA and  has been authorized for detection and/or diagnosis of SARS-CoV-2 by  FDA under an Emergency Use Authorization (EUA). This EUA will remain  in effect (meaning this test can be used) for the duration of the  Covid-19 declaration under Section 564(b)(1) of the Act, 21  U.S.C. section 360bbb-3(b)(1), unless the authorization is  terminated or revoked. Performed at [facility], [address] [place], [place] [address]      Labs: BNP (last 3  results) Recent Labs    07/28/19 1600 07/30/19 0454 08/01/19 0528  BNP 851.0* 822.0* 935.7*   Basic Metabolic Panel: Recent Labs  Lab 08/05/19 0653 08/06/19 0433 08/07/19 0514 08/08/19 0550 08/09/19 0508  NA 140 142 140 141 141  K 4.0 3.3* 3.7 4.1 3.5  CL 103 101 100 102 99  CO2 31 32 '30 31 31  ' GLUCOSE 144* 125* 147* 123* 107*  BUN '17 21 20 22 21  ' CREATININE 0.94 0.92 0.91 1.03* 1.18*  CALCIUM 9.1 9.0 9.0 9.0 8.6*  MG 2.5* 2.2 2.3 2.2 2.3   Liver Function Tests: No results for input(s): AST, ALT, ALKPHOS, BILITOT, PROT, ALBUMIN in the last 168 hours. No results for input(s): LIPASE, AMYLASE in the last 168 hours. No results for input(s): AMMONIA in the last 168 hours. CBC: Recent Labs  Lab 08/05/19 0653 08/06/19 0433 08/07/19 0514 08/08/19 0550 08/09/19 0508  WBC 16.8* 13.6* 12.9* 9.4 8.5  NEUTROABS  --  11.1*  --   --   --   HGB 9.0* 8.2* 9.5* 9.1* 9.1*  HCT 31.4* 28.7* 32.1* 30.3* 31.2*  MCV 90.8 91.1 90.2 90.7 93.1  PLT 2,148* 1,917* 1,744* 1,593* 1,614*   Cardiac Enzymes: No results for input(s): CKTOTAL, CKMB, CKMBINDEX, TROPONINI in the last 168 hours. BNP: Invalid input(s): POCBNP CBG: No results for input(s): GLUCAP in the last 168 hours. D-Dimer No results for input(s): DDIMER in the last 72 hours. Hgb A1c No results for input(s): HGBA1C in the last 72 hours. Lipid Profile No results for input(s): CHOL, HDL, LDLCALC, TRIG, CHOLHDL, LDLDIRECT in the last 72 hours. Thyroid function studies No results for input(s): TSH, T4TOTAL, T3FREE, THYROIDAB in the last 72 hours.  Invalid input(s): FREET3 Anemia work up No results for input(s): VITAMINB12, FOLATE, FERRITIN, TIBC, IRON, RETICCTPCT in the last 72 hours. Urinalysis    Component Value Date/Time   COLORURINE STRAW (A) 07/27/2019 1835   APPEARANCEUR CLEAR 07/27/2019 1835   LABSPEC 1.008 07/27/2019 [place] 7.0 07/27/2019 [place] 07/27/2019 [place] NEGATIVE  07/27/2019 [place] 07/27/2019 [place] 07/27/2019 [place] 07/27/2019 1835   UROBILINOGEN 1.0 03/04/2013 1230   NITRITE NEGATIVE 07/27/2019 [place]  07/27/2019 1835   Sepsis Labs Invalid input(s): PROCALCITONIN,  WBC,  LACTICIDVEN Microbiology Recent Results (from the past 240 hour(s))  SARS CORONAVIRUS 2 (TAT 6-24 HRS) Nasopharyngeal Nasopharyngeal Swab     Status: None   Collection Time: 08/01/19  4:45 AM   Specimen: Nasopharyngeal Swab  Result Value Ref Range Status   SARS Coronavirus 2 NEGATIVE NEGATIVE Final    Comment: (NOTE) SARS-CoV-2 target nucleic acids are NOT DETECTED. The SARS-CoV-2 RNA is generally detectable in upper and lower respiratory specimens during the acute phase of infection. Negative results do not preclude SARS-CoV-2 infection, do not rule out co-infections with other pathogens, and should not be used as the sole basis for treatment or other patient management decisions. Negative results must be combined with clinical observations, patient history, and epidemiological information. The expected result is Negative. Fact Sheet for Patients: SugarRoll.be Fact Sheet for Healthcare Providers: https://www.woods-mathews.com/ This test is not yet approved or cleared by the [place] FDA and  has been authorized for detection and/or diagnosis of SARS-CoV-2 by FDA under an Emergency Use Authorization (EUA). This EUA will remain  in effect (meaning this test can be used) for the duration of the COVID-19 declaration under Section 56 4(b)(1) of the Act, 21 U.S.C. section 360bbb-3(b)(1), unless the authorization is terminated or revoked sooner. Performed at [facility], [place] [address] [place], [place] [address]   SARS CORONAVIRUS 2 (TAT 6-24 HRS) Nasopharyngeal Nasopharyngeal Swab     Status: None   Collection Time: 08/02/19  2:56 PM    Specimen: Nasopharyngeal Swab  Result Value Ref Range Status   SARS Coronavirus 2 NEGATIVE NEGATIVE Final    Comment: (NOTE) SARS-CoV-2 target nucleic acids are NOT DETECTED. The SARS-CoV-2 RNA is generally detectable in upper and lower respiratory specimens during the acute phase of infection. Negative results do not preclude SARS-CoV-2 infection, do not rule out co-infections with other pathogens, and should not be used as the sole basis for treatment or other patient management decisions. Negative results must be combined with clinical observations, patient history, and epidemiological information. The expected result is Negative. Fact Sheet for Patients: SugarRoll.be Fact Sheet for Healthcare Providers: https://www.woods-mathews.com/ This test is not yet approved or cleared by the [place] FDA and  has been authorized for detection and/or diagnosis of SARS-CoV-2 by FDA under an Emergency Use Authorization (EUA). This EUA will remain  in effect (meaning this test can be used) for the duration of the COVID-19 declaration under Section 56 4(b)(1) of the Act, 21 U.S.C. section 360bbb-3(b)(1), unless the authorization is terminated or revoked sooner. Performed at [facility], [place] [address] [place], [place] [address]   SARS CORONAVIRUS 2 (TAT 6-24 HRS) Nasopharyngeal Nasopharyngeal Swab     Status: None   Collection Time: 08/05/19  5:23 PM   Specimen: Nasopharyngeal Swab  Result Value Ref Range Status   SARS Coronavirus 2 NEGATIVE NEGATIVE Final    Comment: (NOTE) SARS-CoV-2 target nucleic acids are NOT DETECTED. The SARS-CoV-2 RNA is generally detectable in upper and lower respiratory specimens during the acute phase of infection. Negative results do not preclude SARS-CoV-2 infection, do not rule out co-infections with other pathogens, and should not be used as the sole basis for treatment or other patient management  decisions. Negative results must be combined with clinical observations, patient history, and epidemiological information. The expected result is Negative. Fact Sheet for Patients: SugarRoll.be Fact Sheet for Healthcare Providers: https://www.woods-mathews.com/ This test is not yet approved or cleared  by the [place] and  has been authorized for detection and/or diagnosis of SARS-CoV-2 by FDA under an Emergency Use Authorization (EUA). This EUA will remain  in effect (meaning this test can be used) for the duration of the COVID-19 declaration under Section 56 4(b)(1) of the Act, 21 U.S.C. section 360bbb-3(b)(1), unless the authorization is terminated or revoked sooner. Performed at [facility], [place] [address] [place], [place] [address]   Respiratory Panel by RT PCR (Flu A&B, Covid) - Nasopharyngeal Swab     Status: None   Collection Time: 08/08/19 10:33 AM   Specimen: Nasopharyngeal Swab  Result Value Ref Range Status   SARS Coronavirus 2 by RT PCR NEGATIVE NEGATIVE Final    Comment: (NOTE) SARS-CoV-2 target nucleic acids are NOT DETECTED. The SARS-CoV-2 RNA is generally detectable in upper respiratoy specimens during the acute phase of infection. The lowest concentration of SARS-CoV-2 viral copies this assay can detect is 131 copies/mL. A negative result does not preclude SARS-Cov-2 infection and should not be used as the sole basis for treatment or other patient management decisions. A negative result may occur with  improper specimen collection/handling, submission of specimen other than nasopharyngeal swab, presence of viral mutation(s) within the areas targeted by this assay, and inadequate number of viral copies (<131 copies/mL). A negative result must be combined with clinical observations, patient history, and epidemiological information. The expected result is Negative. Fact Sheet for Patients:   PinkCheek.be Fact Sheet for Healthcare Providers:  GravelBags.it This test is not yet ap proved or cleared by the [place] FDA and  has been authorized for detection and/or diagnosis of SARS-CoV-2 by FDA under an Emergency Use Authorization (EUA). This EUA will remain  in effect (meaning this test can be used) for the duration of the COVID-19 declaration under Section 564(b)(1) of the Act, 21 U.S.C. section 360bbb-3(b)(1), unless the authorization is terminated or revoked sooner.    Influenza A by PCR NEGATIVE NEGATIVE Final   Influenza B by PCR NEGATIVE NEGATIVE Final    Comment: (NOTE) The Xpert Xpress SARS-CoV-2/FLU/RSV assay is intended as an aid in  the diagnosis of influenza from Nasopharyngeal swab specimens and  should not be used as a sole basis for treatment. Nasal washings and  aspirates are unacceptable for Xpert Xpress SARS-CoV-2/FLU/RSV  testing. Fact Sheet for Patients: PinkCheek.be Fact Sheet for Healthcare Providers: GravelBags.it This test is not yet approved or cleared by the [place] FDA and  has been authorized for detection and/or diagnosis of SARS-CoV-2 by  FDA under an Emergency Use Authorization (EUA). This EUA will remain  in effect (meaning this test can be used) for the duration of the  Covid-19 declaration under Section 564(b)(1) of the Act, 21  U.S.C. section 360bbb-3(b)(1), unless the authorization is  terminated or revoked. Performed at [facility], [address], [place], [place] [address]      Time coordinating discharge: 35 minutes  SIGNED:   Rodena Goldmann, DO [facility] 08/09/2019, 10:42 AM  If 7PM-7AM, please contact night-coverage [email or web address]

## 2019-08-10 DIAGNOSIS — I5033 Acute on chronic diastolic (congestive) heart failure: Secondary | ICD-10-CM | POA: Diagnosis not present

## 2019-08-10 DIAGNOSIS — F329 Major depressive disorder, single episode, unspecified: Secondary | ICD-10-CM | POA: Diagnosis not present

## 2019-08-10 DIAGNOSIS — I4891 Unspecified atrial fibrillation: Secondary | ICD-10-CM | POA: Diagnosis not present

## 2019-08-10 DIAGNOSIS — I10 Essential (primary) hypertension: Secondary | ICD-10-CM | POA: Diagnosis not present

## 2019-08-10 DIAGNOSIS — R5381 Other malaise: Secondary | ICD-10-CM | POA: Diagnosis not present

## 2019-08-10 DIAGNOSIS — C9210 Chronic myeloid leukemia, BCR/ABL-positive, not having achieved remission: Secondary | ICD-10-CM | POA: Diagnosis not present

## 2019-08-13 ENCOUNTER — Telehealth (HOSPITAL_COMMUNITY): Payer: Self-pay | Admitting: Pharmacy Technician

## 2019-08-13 ENCOUNTER — Ambulatory Visit (HOSPITAL_COMMUNITY): Payer: Medicare Other | Admitting: Hematology

## 2019-08-13 ENCOUNTER — Inpatient Hospital Stay (HOSPITAL_COMMUNITY): Payer: Medicare Other

## 2019-08-14 DIAGNOSIS — C9210 Chronic myeloid leukemia, BCR/ABL-positive, not having achieved remission: Secondary | ICD-10-CM | POA: Diagnosis not present

## 2019-08-14 DIAGNOSIS — E875 Hyperkalemia: Secondary | ICD-10-CM | POA: Diagnosis not present

## 2019-08-14 DIAGNOSIS — E559 Vitamin D deficiency, unspecified: Secondary | ICD-10-CM | POA: Diagnosis not present

## 2019-08-14 NOTE — Telephone Encounter (Signed)
Oral Oncology Patient Advocate Encounter  Submitted online application for copay assistance from [employer] for [place] on 08/13/19 with permission from daughter.  Patient has been placed on a wait list pending more funding.  Fund:  [facility]  PSI will contact patient with any additional information they need to proceed with application.  [place] Patient [place] Phone [phone] Fax [phone] 08/14/2019 9:35 AM

## 2019-08-15 ENCOUNTER — Ambulatory Visit: Payer: Medicare Other | Admitting: "Endocrinology

## 2019-08-20 DIAGNOSIS — E875 Hyperkalemia: Secondary | ICD-10-CM | POA: Diagnosis not present

## 2019-08-22 DIAGNOSIS — I5033 Acute on chronic diastolic (congestive) heart failure: Secondary | ICD-10-CM | POA: Diagnosis not present

## 2019-08-22 DIAGNOSIS — I4891 Unspecified atrial fibrillation: Secondary | ICD-10-CM | POA: Diagnosis not present

## 2019-08-24 DIAGNOSIS — Z87891 Personal history of nicotine dependence: Secondary | ICD-10-CM | POA: Diagnosis not present

## 2019-08-24 DIAGNOSIS — I4891 Unspecified atrial fibrillation: Secondary | ICD-10-CM | POA: Diagnosis not present

## 2019-08-24 DIAGNOSIS — J449 Chronic obstructive pulmonary disease, unspecified: Secondary | ICD-10-CM | POA: Diagnosis not present

## 2019-08-24 DIAGNOSIS — I480 Paroxysmal atrial fibrillation: Secondary | ICD-10-CM | POA: Diagnosis not present

## 2019-08-24 DIAGNOSIS — Z20822 Contact with and (suspected) exposure to covid-19: Secondary | ICD-10-CM | POA: Diagnosis not present

## 2019-08-24 DIAGNOSIS — I5033 Acute on chronic diastolic (congestive) heart failure: Secondary | ICD-10-CM | POA: Diagnosis not present

## 2019-08-24 DIAGNOSIS — J9601 Acute respiratory failure with hypoxia: Secondary | ICD-10-CM | POA: Diagnosis not present

## 2019-08-24 DIAGNOSIS — I5043 Acute on chronic combined systolic (congestive) and diastolic (congestive) heart failure: Secondary | ICD-10-CM | POA: Diagnosis not present

## 2019-08-25 ENCOUNTER — Inpatient Hospital Stay (HOSPITAL_COMMUNITY): Payer: Medicare Other

## 2019-08-25 ENCOUNTER — Inpatient Hospital Stay (HOSPITAL_COMMUNITY)
Admission: AD | Admit: 2019-08-25 | Discharge: 2019-09-01 | DRG: 291 | Disposition: A | Discharge status: Skilled Nursing Facility | Payer: Medicare Other | Source: Other Acute Inpatient Hospital | Attending: Student | Admitting: Student

## 2019-08-25 ENCOUNTER — Encounter (HOSPITAL_COMMUNITY): Payer: Self-pay | Admitting: Family Medicine

## 2019-08-25 DIAGNOSIS — J90 Pleural effusion, not elsewhere classified: Secondary | ICD-10-CM | POA: Diagnosis not present

## 2019-08-25 DIAGNOSIS — Z66 Do not resuscitate: Secondary | ICD-10-CM | POA: Diagnosis present

## 2019-08-25 DIAGNOSIS — R7989 Other specified abnormal findings of blood chemistry: Secondary | ICD-10-CM | POA: Diagnosis present

## 2019-08-25 DIAGNOSIS — R195 Other fecal abnormalities: Secondary | ICD-10-CM | POA: Diagnosis not present

## 2019-08-25 DIAGNOSIS — D473 Essential (hemorrhagic) thrombocythemia: Secondary | ICD-10-CM | POA: Diagnosis not present

## 2019-08-25 DIAGNOSIS — F411 Generalized anxiety disorder: Secondary | ICD-10-CM | POA: Diagnosis not present

## 2019-08-25 DIAGNOSIS — Z8249 Family history of ischemic heart disease and other diseases of the circulatory system: Secondary | ICD-10-CM

## 2019-08-25 DIAGNOSIS — J948 Other specified pleural conditions: Secondary | ICD-10-CM | POA: Diagnosis not present

## 2019-08-25 DIAGNOSIS — M2550 Pain in unspecified joint: Secondary | ICD-10-CM | POA: Diagnosis not present

## 2019-08-25 DIAGNOSIS — Z953 Presence of xenogenic heart valve: Secondary | ICD-10-CM | POA: Diagnosis not present

## 2019-08-25 DIAGNOSIS — J441 Chronic obstructive pulmonary disease with (acute) exacerbation: Secondary | ICD-10-CM | POA: Diagnosis not present

## 2019-08-25 DIAGNOSIS — I2510 Atherosclerotic heart disease of native coronary artery without angina pectoris: Secondary | ICD-10-CM | POA: Diagnosis present

## 2019-08-25 DIAGNOSIS — E785 Hyperlipidemia, unspecified: Secondary | ICD-10-CM | POA: Diagnosis present

## 2019-08-25 DIAGNOSIS — E559 Vitamin D deficiency, unspecified: Secondary | ICD-10-CM | POA: Diagnosis not present

## 2019-08-25 DIAGNOSIS — C9210 Chronic myeloid leukemia, BCR/ABL-positive, not having achieved remission: Secondary | ICD-10-CM | POA: Diagnosis present

## 2019-08-25 DIAGNOSIS — I5033 Acute on chronic diastolic (congestive) heart failure: Principal | ICD-10-CM | POA: Diagnosis present

## 2019-08-25 DIAGNOSIS — I4891 Unspecified atrial fibrillation: Secondary | ICD-10-CM | POA: Diagnosis not present

## 2019-08-25 DIAGNOSIS — N179 Acute kidney failure, unspecified: Secondary | ICD-10-CM | POA: Diagnosis present

## 2019-08-25 DIAGNOSIS — Z90710 Acquired absence of both cervix and uterus: Secondary | ICD-10-CM | POA: Diagnosis not present

## 2019-08-25 DIAGNOSIS — R0902 Hypoxemia: Secondary | ICD-10-CM

## 2019-08-25 DIAGNOSIS — I071 Rheumatic tricuspid insufficiency: Secondary | ICD-10-CM | POA: Diagnosis present

## 2019-08-25 DIAGNOSIS — E0520 Thyrotoxicosis with toxic multinodular goiter without thyrotoxic crisis or storm: Secondary | ICD-10-CM | POA: Diagnosis present

## 2019-08-25 DIAGNOSIS — I482 Chronic atrial fibrillation, unspecified: Secondary | ICD-10-CM | POA: Diagnosis present

## 2019-08-25 DIAGNOSIS — R5381 Other malaise: Secondary | ICD-10-CM | POA: Diagnosis present

## 2019-08-25 DIAGNOSIS — D500 Iron deficiency anemia secondary to blood loss (chronic): Secondary | ICD-10-CM | POA: Diagnosis not present

## 2019-08-25 DIAGNOSIS — R0602 Shortness of breath: Secondary | ICD-10-CM | POA: Diagnosis not present

## 2019-08-25 DIAGNOSIS — E876 Hypokalemia: Secondary | ICD-10-CM | POA: Diagnosis present

## 2019-08-25 DIAGNOSIS — I480 Paroxysmal atrial fibrillation: Secondary | ICD-10-CM | POA: Diagnosis not present

## 2019-08-25 DIAGNOSIS — J918 Pleural effusion in other conditions classified elsewhere: Secondary | ICD-10-CM | POA: Diagnosis present

## 2019-08-25 DIAGNOSIS — Z7401 Bed confinement status: Secondary | ICD-10-CM | POA: Diagnosis not present

## 2019-08-25 DIAGNOSIS — J449 Chronic obstructive pulmonary disease, unspecified: Secondary | ICD-10-CM | POA: Diagnosis not present

## 2019-08-25 DIAGNOSIS — J439 Emphysema, unspecified: Secondary | ICD-10-CM | POA: Diagnosis present

## 2019-08-25 DIAGNOSIS — Z79899 Other long term (current) drug therapy: Secondary | ICD-10-CM

## 2019-08-25 DIAGNOSIS — K439 Ventral hernia without obstruction or gangrene: Secondary | ICD-10-CM | POA: Diagnosis present

## 2019-08-25 DIAGNOSIS — J9690 Respiratory failure, unspecified, unspecified whether with hypoxia or hypercapnia: Secondary | ICD-10-CM | POA: Diagnosis not present

## 2019-08-25 DIAGNOSIS — T502X5A Adverse effect of carbonic-anhydrase inhibitors, benzothiadiazides and other diuretics, initial encounter: Secondary | ICD-10-CM | POA: Diagnosis not present

## 2019-08-25 DIAGNOSIS — F339 Major depressive disorder, recurrent, unspecified: Secondary | ICD-10-CM | POA: Diagnosis not present

## 2019-08-25 DIAGNOSIS — J9601 Acute respiratory failure with hypoxia: Secondary | ICD-10-CM | POA: Diagnosis not present

## 2019-08-25 DIAGNOSIS — Z7901 Long term (current) use of anticoagulants: Secondary | ICD-10-CM

## 2019-08-25 DIAGNOSIS — F419 Anxiety disorder, unspecified: Secondary | ICD-10-CM | POA: Diagnosis present

## 2019-08-25 DIAGNOSIS — Z8701 Personal history of pneumonia (recurrent): Secondary | ICD-10-CM

## 2019-08-25 DIAGNOSIS — I509 Heart failure, unspecified: Secondary | ICD-10-CM

## 2019-08-25 DIAGNOSIS — J9600 Acute respiratory failure, unspecified whether with hypoxia or hypercapnia: Secondary | ICD-10-CM | POA: Diagnosis not present

## 2019-08-25 DIAGNOSIS — I350 Nonrheumatic aortic (valve) stenosis: Secondary | ICD-10-CM | POA: Diagnosis not present

## 2019-08-25 DIAGNOSIS — Z7189 Other specified counseling: Secondary | ICD-10-CM | POA: Diagnosis not present

## 2019-08-25 DIAGNOSIS — E873 Alkalosis: Secondary | ICD-10-CM | POA: Diagnosis not present

## 2019-08-25 DIAGNOSIS — I959 Hypotension, unspecified: Secondary | ICD-10-CM | POA: Diagnosis present

## 2019-08-25 DIAGNOSIS — D509 Iron deficiency anemia, unspecified: Secondary | ICD-10-CM | POA: Diagnosis present

## 2019-08-25 DIAGNOSIS — Z20822 Contact with and (suspected) exposure to covid-19: Secondary | ICD-10-CM | POA: Diagnosis present

## 2019-08-25 DIAGNOSIS — K9289 Other specified diseases of the digestive system: Secondary | ICD-10-CM | POA: Diagnosis not present

## 2019-08-25 DIAGNOSIS — Z789 Other specified health status: Secondary | ICD-10-CM | POA: Diagnosis not present

## 2019-08-25 DIAGNOSIS — R710 Precipitous drop in hematocrit: Secondary | ICD-10-CM | POA: Diagnosis not present

## 2019-08-25 DIAGNOSIS — D649 Anemia, unspecified: Secondary | ICD-10-CM | POA: Diagnosis not present

## 2019-08-25 DIAGNOSIS — Z7951 Long term (current) use of inhaled steroids: Secondary | ICD-10-CM

## 2019-08-25 LAB — BASIC METABOLIC PANEL
Anion gap: 14 (ref 5–15)
BUN: 18 mg/dL (ref 8–23)
CO2: 32 mmol/L (ref 22–32)
Calcium: 9.2 mg/dL (ref 8.9–10.3)
Chloride: 94 mmol/L — ABNORMAL LOW (ref 98–111)
Creatinine, Ser: 1.07 mg/dL — ABNORMAL HIGH (ref 0.44–1.00)
GFR calc Af Amer: 56 mL/min — ABNORMAL LOW (ref >60)
GFR calc non Af Amer: 48 mL/min — ABNORMAL LOW (ref >60)
Glucose, Bld: 148 mg/dL — ABNORMAL HIGH (ref 70–99)
Potassium: 3.6 mmol/L (ref 3.5–5.1)
Sodium: 140 mmol/L (ref 135–145)

## 2019-08-25 LAB — CBC WITH DIFFERENTIAL/PLATELET
Abs Immature Granulocytes: 0.1 10*3/uL — ABNORMAL HIGH (ref 0.00–0.07)
Basophils Absolute: 0 10*3/uL (ref 0.0–0.1)
Basophils Relative: 0 %
Eosinophils Absolute: 0 10*3/uL (ref 0.0–0.5)
Eosinophils Relative: 0 %
HCT: 28.8 % — ABNORMAL LOW (ref 36.0–46.0)
Hemoglobin: 8.5 g/dL — ABNORMAL LOW (ref 12.0–15.0)
Immature Granulocytes: 1 %
Lymphocytes Relative: 5 %
Lymphs Abs: 0.6 10*3/uL — ABNORMAL LOW (ref 0.7–4.0)
MCH: 25.2 pg — ABNORMAL LOW (ref 26.0–34.0)
MCHC: 29.5 g/dL — ABNORMAL LOW (ref 30.0–36.0)
MCV: 85.5 fL (ref 80.0–100.0)
Monocytes Absolute: 0.8 10*3/uL (ref 0.1–1.0)
Monocytes Relative: 6 %
Neutro Abs: 12 10*3/uL — ABNORMAL HIGH (ref 1.7–7.7)
Neutrophils Relative %: 88 %
Platelets: 1633 10*3/uL (ref 150–400)
RBC: 3.37 MIL/uL — ABNORMAL LOW (ref 3.87–5.11)
RDW: 17.5 % — ABNORMAL HIGH (ref 11.5–15.5)
WBC: 13.6 10*3/uL — ABNORMAL HIGH (ref 4.0–10.5)
nRBC: 0 % (ref 0.0–0.2)

## 2019-08-25 LAB — TROPONIN I (HIGH SENSITIVITY)
Troponin I (High Sensitivity): 24 ng/L — ABNORMAL HIGH (ref <18)
Troponin I (High Sensitivity): 25 ng/L — ABNORMAL HIGH (ref <18)

## 2019-08-25 MED ORDER — BUSPIRONE HCL 10 MG PO TABS
10.0000 mg | ORAL_TABLET | Freq: Two times a day (BID) | ORAL | Status: DC
  Administered 2019-08-25 – 2019-09-01 (×15): 10 mg via ORAL
  Filled 2019-08-25 (×15): qty 1

## 2019-08-25 MED ORDER — POTASSIUM CHLORIDE CRYS ER 20 MEQ PO TBCR
40.0000 meq | EXTENDED_RELEASE_TABLET | Freq: Two times a day (BID) | ORAL | Status: DC
  Administered 2019-08-25 – 2019-08-26 (×4): 40 meq via ORAL
  Filled 2019-08-25 (×5): qty 2

## 2019-08-25 MED ORDER — ALPRAZOLAM 0.5 MG PO TABS
0.5000 mg | ORAL_TABLET | Freq: Three times a day (TID) | ORAL | Status: DC | PRN
  Administered 2019-08-25 – 2019-08-30 (×6): 0.5 mg via ORAL
  Filled 2019-08-25 (×6): qty 1

## 2019-08-25 MED ORDER — SODIUM CHLORIDE 0.9 % IV SOLN: 250.0000 mL | INTRAVENOUS | Status: DC | PRN

## 2019-08-25 MED ORDER — ARFORMOTEROL TARTRATE 15 MCG/2ML IN NEBU
15.0000 ug | INHALATION_SOLUTION | Freq: Two times a day (BID) | RESPIRATORY_TRACT | Status: DC
  Administered 2019-08-25 – 2019-09-01 (×15): 15 ug via RESPIRATORY_TRACT
  Filled 2019-08-25 (×16): qty 2

## 2019-08-25 MED ORDER — ONDANSETRON HCL 4 MG/2ML IJ SOLN: 4.0000 mg | Freq: Four times a day (QID) | INTRAMUSCULAR | Status: DC | PRN

## 2019-08-25 MED ORDER — ALBUTEROL SULFATE (2.5 MG/3ML) 0.083% IN NEBU: 3.0000 mL | INHALATION_SOLUTION | RESPIRATORY_TRACT | Status: DC | PRN

## 2019-08-25 MED ORDER — FUROSEMIDE 10 MG/ML IJ SOLN
40.0000 mg | Freq: Two times a day (BID) | INTRAMUSCULAR | Status: DC
  Administered 2019-08-25 – 2019-08-26 (×3): 40 mg via INTRAVENOUS
  Filled 2019-08-25 (×5): qty 4

## 2019-08-25 MED ORDER — IMATINIB MESYLATE 100 MG PO TABS
100.0000 mg | ORAL_TABLET | Freq: Every day | ORAL | Status: DC
  Administered 2019-08-25 – 2019-08-28 (×4): 100 mg via ORAL
  Filled 2019-08-25 (×4): qty 1

## 2019-08-25 MED ORDER — APIXABAN 5 MG PO TABS
5.0000 mg | ORAL_TABLET | Freq: Two times a day (BID) | ORAL | Status: DC
  Administered 2019-08-25 – 2019-08-26 (×4): 5 mg via ORAL
  Filled 2019-08-25 (×5): qty 1

## 2019-08-25 MED ORDER — SODIUM CHLORIDE 0.9% FLUSH
3.0000 mL | Freq: Two times a day (BID) | INTRAVENOUS | Status: DC
  Administered 2019-08-25 – 2019-08-31 (×10): 3 mL via INTRAVENOUS
  Administered 2019-08-31: 10 mL via INTRAVENOUS
  Administered 2019-09-01: 3 mL via INTRAVENOUS

## 2019-08-25 MED ORDER — DILTIAZEM HCL ER COATED BEADS 180 MG PO CP24
180.0000 mg | ORAL_CAPSULE | Freq: Every day | ORAL | Status: DC
  Administered 2019-08-25 – 2019-09-01 (×8): 180 mg via ORAL
  Filled 2019-08-25 (×8): qty 1

## 2019-08-25 MED ORDER — METOPROLOL SUCCINATE ER 25 MG PO TB24
25.0000 mg | ORAL_TABLET | Freq: Every day | ORAL | Status: DC
  Administered 2019-08-25 – 2019-09-01 (×5): 25 mg via ORAL
  Filled 2019-08-25 (×7): qty 1

## 2019-08-25 MED ORDER — SODIUM CHLORIDE 0.9 % IV SOLN
500.0000 mg | INTRAVENOUS | Status: DC
  Administered 2019-08-25 – 2019-08-29 (×5): 500 mg via INTRAVENOUS
  Filled 2019-08-25 (×5): qty 500

## 2019-08-25 MED ORDER — LIP MEDEX EX OINT
TOPICAL_OINTMENT | CUTANEOUS | Status: DC | PRN
  Filled 2019-08-25: qty 7

## 2019-08-25 MED ORDER — BUDESONIDE 0.5 MG/2ML IN SUSP
0.5000 mg | Freq: Two times a day (BID) | RESPIRATORY_TRACT | Status: DC
  Administered 2019-08-25 – 2019-09-01 (×15): 0.5 mg via RESPIRATORY_TRACT
  Filled 2019-08-25 (×16): qty 2

## 2019-08-25 MED ORDER — DULOXETINE HCL 20 MG PO CPEP
20.0000 mg | ORAL_CAPSULE | Freq: Every day | ORAL | Status: DC
  Administered 2019-08-25 – 2019-09-01 (×8): 20 mg via ORAL
  Filled 2019-08-25 (×9): qty 1

## 2019-08-25 MED ORDER — METHYLPREDNISOLONE SODIUM SUCC 40 MG IJ SOLR
40.0000 mg | Freq: Three times a day (TID) | INTRAMUSCULAR | Status: DC
  Administered 2019-08-25 – 2019-08-26 (×5): 40 mg via INTRAVENOUS
  Filled 2019-08-25 (×5): qty 1

## 2019-08-25 MED ORDER — ATORVASTATIN CALCIUM 40 MG PO TABS
40.0000 mg | ORAL_TABLET | Freq: Every day | ORAL | Status: DC
  Administered 2019-08-25 – 2019-08-31 (×7): 40 mg via ORAL
  Filled 2019-08-25 (×7): qty 1

## 2019-08-25 MED ORDER — SODIUM CHLORIDE 0.9% FLUSH: 3.0000 mL | INTRAVENOUS | Status: DC | PRN

## 2019-08-25 MED ORDER — ACETAMINOPHEN 325 MG PO TABS
650.0000 mg | ORAL_TABLET | ORAL | Status: DC | PRN
  Administered 2019-08-26: 650 mg via ORAL
  Filled 2019-08-25: qty 2

## 2019-08-25 NOTE — H&P (Signed)
History and Physical    Deleyza Tames [id number] DOB: 01-27-1936 DOA: 08/25/2019  PCP: Frances Maywood, FNP   Patient coming from: SNF   Chief Complaint: SOB   HPI: Nicole [name] is a 84 y.o. female with medical history significant for CML on Gleevec, COPD, chronic diastolic CHF, bioprosthetic AVR, and atrial fibrillation on Eliquis, now presenting to the emergency department for evaluation of shortness of breath.  Patient reports progressive dyspnea over the past 3 days associated with wheezing, orthopnea, and perhaps some mild lower extremity swelling.  Patient denies any fevers or chills, reports being treated for pneumonia 1 month ago but making a full recovery from that.  She denies any chest pain or palpitations.  She appeared to be in respiratory distress last night at her SNF, was given 40 mg torsemide and neb treatment, and transported to the ED.  [place] ED Course: Upon arrival to the ED, patient is found to be afebrile, saturating mid 80s on room air, and acute respiratory distress, respiratory rate in the 30s, heart rate in the 150s, and stable blood pressure.  EKG features atrial fibrillation with rate 151.  BNP 700 range.  Procalcitonin 0.06.  CBC with chronic leukocytosis, anemia, and thrombocytosis.  Patient was placed on BiPAP, treated with Rocephin and azithromycin, given 5 mg IV bolus of diltiazem, albuterol neb, and seem to make fairly good improvement with this.  Transfer to [facility] was arranged for ongoing evaluation and management.  Review of Systems:  All other systems reviewed and apart from HPI, are negative.  Past Medical History:  Diagnosis Date  . Anxiety   . Atrial fibrillation ([place])   . COPD (chronic obstructive pulmonary disease) ([place])   . Diastolic CHF ([place])   . Hyperlipidemia   . SOB (shortness of breath)    conplicated by underlying a fib with RVR,COPD, and CHF  . Valvular heart disease    a.s/p tissue AVR in 2014 with MV ring and maze  procedure    Past Surgical History:  Procedure Laterality Date  . ABDOMINAL HYSTERECTOMY    . AORTIC VALVE REPLACEMENT N/A 03/05/2013   Procedure: AORTIC VALVE REPLACEMENT (AVR);  Surgeon: Gaye Pollack, MD;  Location: [place];  Service: Open Heart Surgery;  Laterality: N/A;  . BREAST BIOPSY     x3  . CARDIOVERSION N/A 02/13/2013   Procedure: CARDIOVERSION;  Surgeon: [name], MD;  Location[facility];  Service: Cardiovascular;  Laterality: N/A;  . CARDIOVERSION N/A 03/01/2013   Procedure: CARDIOVERSION;  Surgeon: [name], MD;  Location: [facility] ENDOSCOPY;  Service: Cardiovascular;  Laterality: N/A;  . CARDIOVERSION N/A 06/23/2016   Procedure: CARDIOVERSION;  Surgeon: [name], MD;  Location: [place];  Service: Cardiovascular;  Laterality: N/A;  . CATARACT EXTRACTION    . COLONOSCOPY WITH PROPOFOL N/A 06/24/2017   Procedure: COLONOSCOPY WITH PROPOFOL;  Surgeon: Carol Ada, MD;  Location: [place];  Service: Endoscopy;  Laterality: N/A;  . ESOPHAGOGASTRODUODENOSCOPY N/A 06/24/2017   Procedure: ESOPHAGOGASTRODUODENOSCOPY (EGD);  Surgeon: Carol Ada, MD;  Location: [place];  Service: Endoscopy;  Laterality: N/A;  . INTRAOPERATIVE TRANSESOPHAGEAL ECHOCARDIOGRAM N/A 03/05/2013   Procedure: INTRAOPERATIVE TRANSESOPHAGEAL ECHOCARDIOGRAM;  Surgeon: Gaye Pollack, MD;  Location: [place] [facility];  Service: Open Heart Surgery;  Laterality: N/A;  . LEFT AND RIGHT HEART CATHETERIZATION WITH CORONARY ANGIOGRAM N/A 03/02/2013   Procedure: LEFT AND RIGHT HEART CATHETERIZATION WITH CORONARY ANGIOGRAM;  Surgeon: [name], MD;  Location: [facility] CATH LAB;  Service: Cardiovascular;  Laterality: N/A;  . MAZE N/A 03/05/2013   Procedure: MAZE;  Surgeon: Gaye Pollack, MD;  Location: [place];  Service: Open Heart Surgery;  Laterality: N/A;  . MITRAL VALVE REPAIR N/A 03/05/2013   Procedure: MITRAL VALVE REPAIR (MVR);  Surgeon: Gaye Pollack, MD;  Location: [place];  Service: Open Heart  Surgery;  Laterality: N/A;  . ROTATOR CUFF REPAIR    . TEE WITHOUT CARDIOVERSION N/A 03/01/2013   Procedure: TRANSESOPHAGEAL ECHOCARDIOGRAM (TEE);  Surgeon: [name], MD;  Location: [place];  Service: Cardiovascular;  Laterality: N/A;  talked to bev. booking no. is [id number] called trish to verify time  . TONSILLECTOMY       reports that she quit smoking about 6 years ago. Her smoking use included cigarettes. She started smoking about 64 years ago. She has a 57.00 pack-year smoking history. She has never used smokeless tobacco. She reports that she does not drink alcohol or use drugs.  No Known Allergies  Family History  Problem Relation Age of Onset  . Breast cancer Other        family history  . Heart disease Mother   . Throat cancer Father   . Heart disease Brother   . Heart attack Son   . Breast cancer Daughter      Prior to Admission medications   Medication Sig Start Date End Date Taking? Authorizing Provider  albuterol (VENTOLIN HFA) 108 (90 Base) MCG/ACT inhaler Inhale 2 puffs into the lungs as needed.  05/09/19   [provider]  ALPRAZolam Duanne Moron) 0.5 MG tablet Take 1 tablet (0.5 mg total) by mouth 3 (three) times daily as needed for anxiety. 08/09/19   Manuella Ghazi, Pratik D, DO  arformoterol (BROVANA) 15 MCG/2ML NEBU Take 2 mLs (15 mcg total) by nebulization 2 (two) times daily. J 44.9 08/09/19   Shah, Pratik D, DO  atorvastatin (LIPITOR) 40 MG tablet TAKE 1 TABLET BY MOUTH EVERY DAY IN THE EVENING 03/01/19   [name], MD  BREO ELLIPTA 100-25 MCG/INH AEPB Inhale 1 puff into the lungs daily. 06/20/19   [provider]  budesonide (PULMICORT) 0.5 MG/2ML nebulizer solution Take 2 mLs (0.5 mg total) by nebulization 2 (two) times daily. J 44.9 08/09/19   Shah, Pratik D, DO  busPIRone (BUSPAR) 10 MG tablet Take 1 tablet (10 mg total) by mouth 2 (two) times daily. 08/09/19   Manuella Ghazi, Pratik D, DO  diltiazem (CARDIZEM CD) 180 MG 24 hr capsule Take 1 capsule (180  mg total) by mouth daily. 08/10/19 09/09/19  Manuella Ghazi, Pratik D, DO  docusate sodium (COLACE) 100 MG capsule Take 2 capsules (200 mg total) by mouth at bedtime. 08/09/19   Manuella Ghazi, Pratik D, DO  DULoxetine (CYMBALTA) 20 MG capsule Take 1 capsule (20 mg total) by mouth daily. 06/24/17   Thomasene Ripple, MD  ELIQUIS 5 MG TABS tablet TAKE 1 TABLET BY MOUTH TWICE A DAY 03/21/19   [name], MD  ergocalciferol (VITAMIN D2) 1.25 MG (50000 UT) capsule Take 1 capsule (50,000 Units total) by mouth once a week. 07/09/19   Derek Jack, MD  feeding supplement, ENSURE ENLIVE, (ENSURE ENLIVE) LIQD Take 237 mLs by mouth daily. 08/09/19   Manuella Ghazi, Pratik D, DO  ferrous sulfate 325 (65 FE) MG EC tablet Take 325 mg by mouth daily with breakfast.    [provider]  imatinib (GLEEVEC) 100 MG tablet Take 1 tablet (100 mg total) by mouth daily. Take with meals and large glass of  water.Caution:Chemotherapy 07/24/19   Derek Jack, MD  metoprolol succinate (TOPROL-XL) 25 MG 24 hr tablet Take 1 tablet (25 mg total) by mouth daily. 08/10/19 09/09/19  Manuella Ghazi, Pratik D, DO  Multiple Vitamins-Minerals (MULTIVITAMIN WITH MINERALS) tablet Take 1 tablet by mouth daily.    [provider]  polyethylene glycol (MIRALAX / GLYCOLAX) 17 g packet Take 17 g by mouth daily as needed for mild constipation. 08/09/19   Manuella Ghazi, Pratik D, DO  potassium chloride SA (KLOR-CON M20) 20 MEQ tablet Take 2 tablets (40 mEq total) by mouth 2 (two) times daily. 07/17/19   [name], MD  prochlorperazine (COMPAZINE) 10 MG tablet Take 1 tablet (10 mg total) by mouth every 6 (six) hours as needed for nausea or vomiting. 07/24/19   Derek Jack, MD  torsemide (DEMADEX) 20 MG tablet Take 3 tablets (60 mg total) by mouth daily. 08/10/19 09/09/19  Heath Lark D, DO    Physical Exam: Vitals:   08/25/19 0538  BP: 107/78  Pulse: 78  Resp: 18  Temp: (!) 97.5 F (36.4 C)  TempSrc: Axillary  SpO2: 95%  Weight: 85.9 kg     Constitutional: NAD, calm, on BiPAP Eyes: PERTLA, lids and conjunctivae normal ENMT: Mucous membranes are moist. Posterior pharynx clear of any exudate or lesions.   Neck: normal, supple, no masses, no thyromegaly Respiratory: Diminished bilaterally with scattered wheezes. No accessory muscle use.  Cardiovascular: Rate ~100 and irregularly irregylar. Trace bilateral lower leg swelling. Abdomen: No distension, no tenderness, soft. Bowel sounds active.  Musculoskeletal: no clubbing / cyanosis. No joint deformity upper and lower extremities.   Skin: no significant rashes, lesions, ulcers. Warm, dry, well-perfused. Neurologic: CN 2-12 grossly intact. Sensation intact. Moving all extremities.   Psychiatric: Alert and oriented x 3. Pleasant and cooperative.    Labs and Imaging on Admission: I have personally reviewed following labs and imaging studies  CBC: No results for input(s): WBC, NEUTROABS, HGB, HCT, MCV, PLT in the last 168 hours. Basic Metabolic Panel: No results for input(s): NA, K, CL, CO2, GLUCOSE, BUN, CREATININE, CALCIUM, MG, PHOS in the last 168 hours. GFR: Estimated Creatinine Clearance: 38.2 mL/min (A) (by C-G formula based on SCr of 1.18 mg/dL (H)). Liver Function Tests: No results for input(s): AST, ALT, ALKPHOS, BILITOT, PROT, ALBUMIN in the last 168 hours. No results for input(s): LIPASE, AMYLASE in the last 168 hours. No results for input(s): AMMONIA in the last 168 hours. Coagulation Profile: No results for input(s): INR, PROTIME in the last 168 hours. Cardiac Enzymes: No results for input(s): CKTOTAL, CKMB, CKMBINDEX, TROPONINI in the last 168 hours. BNP (last 3 results) No results for input(s): PROBNP in the last 8760 hours. HbA1C: No results for input(s): HGBA1C in the last 72 hours. CBG: No results for input(s): GLUCAP in the last 168 hours. Lipid Profile: No results for input(s): CHOL, HDL, LDLCALC, TRIG, CHOLHDL, LDLDIRECT in the last 72 hours. Thyroid  Function Tests: No results for input(s): TSH, T4TOTAL, FREET4, T3FREE, THYROIDAB in the last 72 hours. Anemia Panel: No results for input(s): VITAMINB12, FOLATE, FERRITIN, TIBC, IRON, RETICCTPCT in the last 72 hours. Urine analysis:    Component Value Date/Time   COLORURINE STRAW (A) 07/27/2019 [place] 07/27/2019 1835   LABSPEC 1.008 07/27/2019 [place] 7.0 07/27/2019 [place] 07/27/2019 [place] NEGATIVE 07/27/2019 [place] 07/27/2019 [place] 07/27/2019 [place] NEGATIVE 07/27/2019 1835  UROBILINOGEN 1.0 03/04/2013 1230   NITRITE NEGATIVE 07/27/2019 [place] 07/27/2019 1835   Sepsis Labs: @LABRCNTIP (procalcitonin:4,lacticidven:4) )No results found for this or any previous visit (from the past 240 hour(s)).   Radiological Exams on Admission: No results found.  EKG: Independently reviewed. Atrial fibrillation with RVR, rate 151.    Assessment/Plan   1. Acute hypoxic respiratory failure  - Presents from SNF with 3 days of progressive SOB, wheezing, orthopnea, chronic leukocytosis, low procalcitonin, BNP elevated, no fevers/chills, or chest pain, and is found to be in distress with hypoxia and AF RVR,    - Seems to be primarily COPD, though CXR not available to view and some features suggest CHF  -   2. COPD with acute exacerbation  - Presents with respiratory distress, diffuse wheezes, and hypoxia - She was started on BiPAP in ED and treated with IV steroids, nebs, and abx  - Check sputum culture, continue systemic steroid and azithromycin, continue budesonide, arformoterol, albuterol, BiPAP prn   3. Acute on chronic diastolic CHF  - Patient wheezing, does not appear grossly hypervolemic but reports orthopnea, has elevated BNP and CXR in outside hospital ED reportedly looked to have edema though not available to view in our system  - Echo from March 2021 with EF  60-65%, severe LAE, severe RAE, and mild TR - Check CXR, diurese with Lasix 40 mg IV q12h, follow I/Os   4. Atrial fibrillation with RVR  - Had rate 150s on arrival to ED, was given IV bolus diltiazem in ED  - HR 90s on arrival to [facility]  - Continue Eliquis, home-dose oral diltiazem & metoprolol   5. CML  - CBC appears stable  - Follows with oncology and recently started on Gleevec     DVT prophylaxis: Eliquis  Code Status: DNR, confirmed with patient  Family Communication: Discussed with patient   Disposition Plan: Likely back to SNF in 3-4 days once respiratory status improved, stable  Consults called: None  Admission status: Inpatient     Vianne Bulls, MD [facility] Pager: See [email or web address]  If 7AM-7PM, please contact the daytime attending [email or web address]  08/25/2019, 6:50 AM

## 2019-08-25 NOTE — Progress Notes (Signed)
Upon req  Placed on BIPAP will observe for effectiveness or change in status.

## 2019-08-25 NOTE — Progress Notes (Signed)
Received sitting ibn chair, alert and well related, BP loww-99/58-100/63. HR-100-120's, , RR 20-32, transferred from chair to be,some dyspnea and tachycardia. Made comfortable subsequently given Xanax 0.5 to help with anxiety and HR. At present refusing BIPAP will observe for overall effectiveness of xanax. Patient denies distress.

## 2019-08-25 NOTE — Discharge Instructions (Signed)

## 2019-08-25 NOTE — Progress Notes (Addendum)
PROGRESS NOTE  Dotti Voisard [id number] DOB: Jan 11, 1936 DOA: 08/25/2019 PCP: Frances Maywood, FNP  Brief History    Nicole [name] is a 84 y.o. female with medical history significant for CML on Gleevec, COPD, chronic diastolic CHF, bioprosthetic AVR, and atrial fibrillation on Eliquis, now presenting to the emergency department for evaluation of shortness of breath.  Patient reports progressive dyspnea over the past 3 days associated with wheezing, orthopnea, and perhaps some mild lower extremity swelling.  Patient denies any fevers or chills, reports being treated for pneumonia 1 month ago but making a full recovery from that.  She denies any chest pain or palpitations.  She appeared to be in respiratory distress last night at her SNF, was given 40 mg torsemide and neb treatment, and transported to the ED.  ED Course: Upon arrival to the ED, patient is found to be afebrile, saturating mid 80s on room air, and acute respiratory distress, respiratory rate in the 30s, heart rate in the 150s, and stable blood pressure.  EKG features atrial fibrillation with rate 151.  BNP 700 range.  Procalcitonin 0.06.  CBC with chronic leukocytosis, anemia, and thrombocytosis.  Patient was placed on BiPAP, treated with Rocephin and azithromycin, given 5 mg IV bolus of diltiazem, albuterol neb, and seem to make fairly good improvement with this.  Transfer to [facility] was arranged for ongoing evaluation and management.  The patient has been admitted to a telemetry bed. Pt remains in atrial fibrillation with RVR, although rate on current EKG is 103. Use of rate limiting medications has been limited by the patient's blood pressure Diuretics have been continued, but have been limited by the patient's low blood pressures. EKG, BMP, Troponins, CXR, and echocardiogram have been ordered. Heart failure team has been consulted.  Consultants  . Heart Failure Team  Procedures  . None  Antibiotics   Anti-infectives  (From admission, onward)   Start     Dose/Rate Route Frequency Ordered Stop   08/25/19 1930  azithromycin (ZITHROMAX) 500 mg in sodium chloride 0.9 % 250 mL IVPB     500 mg 250 mL/hr over 60 Minutes Intravenous Every 24 hours 08/25/19 0649      .  Subjective  Upon my visit the patient has just been taken off of BIPAP and placed on nasal cannula. She is notably dyspneic with conversation.   Objective   Vitals:  Vitals:   08/25/19 1348 08/25/19 1622  BP:  (!) 102/54  Pulse:    Resp: (!) 24 20  Temp:    SpO2: 97% 97%   Exam:  Constitutional:  . The patient is awake, alert, and oriented x 3. No acute distress. Respiratory:  . Positive for tachypnea and accessory muscle use. Marland Kitchen No wheezes, rales, or rhonchi . No tactile fremitus Cardiovascular:  . Irregular rate and rhythm. Rate is fast. . No murmurs, ectopy, or gallups. . No lateral PMI. No thrills. Abdomen:  . Abdomen is soft, non-tender, non-distended . No hernias, masses, or organomegaly . Normoactive bowel sounds.  Musculoskeletal:  . No cyanosis, clubbing, or edema Skin:  . No rashes, lesions, ulcers . palpation of skin: no induration or nodules Neurologic:  . CN 2-12 intact . Sensation all 4 extremities intact Psychiatric:  . Mental status o Mood, affect appropriate o Orientation to person, place, time  . judgment and insight appear intact  I have personally reviewed the following:   Today's Data  . Vitals . BMP: Pending . Troponins: Pending  Imaging  .  CXR: Pending  Cardiology Data  . EKG . Echocardiogram: Pending .  Scheduled Meds: . apixaban  5 mg Oral BID  . arformoterol  15 mcg Nebulization BID  . atorvastatin  40 mg Oral q1800  . budesonide  0.5 mg Nebulization BID  . busPIRone  10 mg Oral BID  . diltiazem  180 mg Oral Daily  . DULoxetine  20 mg Oral Daily  . furosemide  40 mg Intravenous Q12H  . imatinib  100 mg Oral Q breakfast  . methylPREDNISolone (SOLU-MEDROL) injection  40 mg  Intravenous Q8H  . metoprolol succinate  25 mg Oral Daily  . potassium chloride SA  40 mEq Oral BID  . sodium chloride flush  3 mL Intravenous Q12H   Continuous Infusions: . sodium chloride    . azithromycin      Principal Problem:   Acute respiratory failure with hypoxia (HCC) Active Problems:   CAD (coronary artery disease)   Atrial fibrillation with RVR (HCC)   Acute on chronic congestive heart failure (HCC)   COPD exacerbation (HCC)   CML (chronic myelocytic leukemia) ([place])   LOS: 0 days   A & P  Acute hypoxic respiratory failure: Pt now off of BIPAP, but with notable dyspnea with conversation. CXR, Echocardiogram is pending. Likely due to a combination of CHF, volume overload, COPD, and atrial fibrillation. Diuresis and rate controlling medications are limited due to the patient's hypotension. Continue steroids and nebulizers.   -   COPD with acute exacerbation: Pt is receiving IV steroids, nebs, and antibiotics. CXR is pending. Off of BIPAP, but dyspneic.   Acute on chronic diastolic CHF: Pt with wheezes and rales at bases. Report orthopnea. BMP, CXR, and Echocardiogram are pending. Diuresis is limited by low blood pressures. BNP elevated. Heart failure team has been consulted.  Atrial fibrillation with RVR: Rate 103 on EKG. Unable to use rate limiting meds (except amiodarone) due to hypotension. Heart failure team has been consutled. She has been continued on Eliquis. Metoprolol and diltiazem have been held due to low BM.  Hypotension: With leukocytosis, left shift. Concern for sepsis. Will check Urine cultures and blood cultures x 2. CXR also pending.  CML:  CBC appears stable. Follows with oncology and recently started on [place].   Thrombocytosis: Chronic. Likely related to CML.   I have seen and examined this patient myself. I have spent 48 minutes in her evaluation and care. I have discussed the patient in detail with her daughter. All questions answered to the  best of my ability.  DVT prophylaxis: Eliquis  Code Status: DNR, confirmed with patient  Family Communication: Discussed with patient's daughter Disposition Plan: From SNF. Likely back to SNF in 3-4 days. Barrier's to discharge: Clinical improvement in atrial fibrillation, CHF, and COPD with resolution of acute respiratory failure.    Ladiamond Gallina, DO [facility] Direct contact: see [email or web address]  7PM-7AM contact night coverage as above 08/25/2019, 4:28 PM  LOS: 0 days

## 2019-08-26 ENCOUNTER — Inpatient Hospital Stay (HOSPITAL_COMMUNITY): Payer: Medicare Other

## 2019-08-26 LAB — CBC WITH DIFFERENTIAL/PLATELET
Abs Immature Granulocytes: 0.14 10*3/uL — ABNORMAL HIGH (ref 0.00–0.07)
Basophils Absolute: 0 10*3/uL (ref 0.0–0.1)
Basophils Relative: 0 %
Eosinophils Absolute: 0 10*3/uL (ref 0.0–0.5)
Eosinophils Relative: 0 %
HCT: 27.3 % — ABNORMAL LOW (ref 36.0–46.0)
Hemoglobin: 8 g/dL — ABNORMAL LOW (ref 12.0–15.0)
Immature Granulocytes: 1 %
Lymphocytes Relative: 4 %
Lymphs Abs: 0.7 10*3/uL (ref 0.7–4.0)
MCH: 25.6 pg — ABNORMAL LOW (ref 26.0–34.0)
MCHC: 29.3 g/dL — ABNORMAL LOW (ref 30.0–36.0)
MCV: 87.2 fL (ref 80.0–100.0)
Monocytes Absolute: 0.9 10*3/uL (ref 0.1–1.0)
Monocytes Relative: 5 %
Neutro Abs: 15.4 10*3/uL — ABNORMAL HIGH (ref 1.7–7.7)
Neutrophils Relative %: 90 %
Platelets: 1393 10*3/uL (ref 150–400)
RBC: 3.13 MIL/uL — ABNORMAL LOW (ref 3.87–5.11)
RDW: 17.2 % — ABNORMAL HIGH (ref 11.5–15.5)
WBC: 17.1 10*3/uL — ABNORMAL HIGH (ref 4.0–10.5)
nRBC: 0 % (ref 0.0–0.2)

## 2019-08-26 LAB — BASIC METABOLIC PANEL
Anion gap: 11 (ref 5–15)
BUN: 29 mg/dL — ABNORMAL HIGH (ref 8–23)
CO2: 29 mmol/L (ref 22–32)
Calcium: 9 mg/dL (ref 8.9–10.3)
Chloride: 97 mmol/L — ABNORMAL LOW (ref 98–111)
Creatinine, Ser: 1.21 mg/dL — ABNORMAL HIGH (ref 0.44–1.00)
GFR calc Af Amer: 48 mL/min — ABNORMAL LOW (ref >60)
GFR calc non Af Amer: 42 mL/min — ABNORMAL LOW (ref >60)
Glucose, Bld: 187 mg/dL — ABNORMAL HIGH (ref 70–99)
Potassium: 4.5 mmol/L (ref 3.5–5.1)
Sodium: 137 mmol/L (ref 135–145)

## 2019-08-26 LAB — PROCALCITONIN: Procalcitonin: <0.1 ng/mL

## 2019-08-26 MED ORDER — PREDNISONE 20 MG PO TABS
40.0000 mg | ORAL_TABLET | Freq: Every day | ORAL | Status: DC
  Administered 2019-08-27 – 2019-08-31 (×5): 40 mg via ORAL
  Filled 2019-08-26 (×5): qty 2

## 2019-08-26 MED ORDER — FUROSEMIDE 10 MG/ML IJ SOLN
40.0000 mg | Freq: Two times a day (BID) | INTRAMUSCULAR | Status: DC
  Filled 2019-08-26: qty 4

## 2019-08-26 NOTE — Progress Notes (Signed)
PROGRESS NOTE  Nicole [name] [id number] DOB: 1936-04-28 DOA: 08/25/2019 PCP: Frances Maywood, FNP  Brief History    Nicole [name] is a 84 y.o. female with medical history significant for CML on Gleevec, COPD, chronic diastolic CHF, bioprosthetic AVR, and atrial fibrillation on Eliquis, now presenting to the emergency department for evaluation of shortness of breath.  Patient reports progressive dyspnea over the past 3 days associated with wheezing, orthopnea, and perhaps some mild lower extremity swelling.  Patient denies any fevers or chills, reports being treated for pneumonia 1 month ago but making a full recovery from that.  She denies any chest pain or palpitations.  She appeared to be in respiratory distress last night at her SNF, was given 40 mg torsemide and neb treatment, and transported to the ED.  ED Course: Upon arrival to the ED, patient is found to be afebrile, saturating mid 80s on room air, and acute respiratory distress, respiratory rate in the 30s, heart rate in the 150s, and stable blood pressure.  EKG features atrial fibrillation with rate 151.  BNP 700 range.  Procalcitonin 0.06.  CBC with chronic leukocytosis, anemia, and thrombocytosis.  Patient was placed on BiPAP, treated with Rocephin and azithromycin, given 5 mg IV bolus of diltiazem, albuterol neb, and seem to make fairly good improvement with this.  Transfer to [facility] was arranged for ongoing evaluation and management.  The patient has been admitted to a telemetry bed. Pt remains in atrial fibrillation with RVR, although rate on current EKG is 103. Use of rate limiting medications has been limited by the patient's blood pressure Diuretics have been continued, but have been limited by the patient's low blood pressures. EKG, BMP, Troponins, CXR, and echocardiogram have been ordered. Heart failure team has been consulted.  Consultants  . Heart Failure Team  Procedures  . None  Antibiotics   Anti-infectives  (From admission, onward)   Start     Dose/Rate Route Frequency Ordered Stop   08/25/19 1930  azithromycin (ZITHROMAX) 500 mg in sodium chloride 0.9 % 250 mL IVPB     500 mg 250 mL/hr over 60 Minutes Intravenous Every 24 hours 08/25/19 0649       Subjective  Pt intermittently has required BIPAP in the last 24 hours, particularly overnight. No new complaints.  Objective   Vitals:  Vitals:   08/26/19 0808 08/26/19 1113  BP: 98/69 109/63  Pulse: 84 86  Resp: 18 19  Temp: 97.7 F (36.5 C) 97.8 F (36.6 C)  SpO2: 95% 99%   Exam:  Constitutional:  . The patient is awake, alert, and oriented x 3. No acute distress. Respiratory:  . No increased work of breathing.  . No wheezes, rales, or rhonchi . No tactile fremitus Cardiovascular:  . Irregular rate and rhythm. Rate is fast. . No murmurs, ectopy, or gallups. . No lateral PMI. No thrills. Abdomen:  . Abdomen is soft, non-tender, non-distended . No hernias, masses, or organomegaly . Normoactive bowel sounds.  Musculoskeletal:  . No cyanosis, clubbing, or edema Skin:  . No rashes, lesions, ulcers . palpation of skin: no induration or nodules Neurologic:  . CN 2-12 intact . Sensation all 4 extremities intact Psychiatric:  . Mental status o Mood, affect appropriate o Orientation to person, place, time  . judgment and insight appear intact  I have personally reviewed the following:   Today's Data  . Vitals, BMP, and CBC  Imaging  . CXR: Pulmonary vascular congestion . Right lower lobe consolidate.  Cardiology Data  . EKG: Atrial Fibrillation with RVR 08/25/2019 . Echocardiogram: Pending .  Scheduled Meds: . apixaban  5 mg Oral BID  . arformoterol  15 mcg Nebulization BID  . atorvastatin  40 mg Oral q1800  . budesonide  0.5 mg Nebulization BID  . busPIRone  10 mg Oral BID  . diltiazem  180 mg Oral Daily  . DULoxetine  20 mg Oral Daily  . furosemide  40 mg Intravenous Q12H  . imatinib  100 mg Oral Q breakfast   . methylPREDNISolone (SOLU-MEDROL) injection  40 mg Intravenous Q8H  . metoprolol succinate  25 mg Oral Daily  . potassium chloride SA  40 mEq Oral BID  . sodium chloride flush  3 mL Intravenous Q12H   Continuous Infusions: . sodium chloride    . azithromycin 500 mg (08/25/19 2036)    Principal Problem:   Acute respiratory failure with hypoxia (HCC) Active Problems:   CAD (coronary artery disease)   Atrial fibrillation with RVR (HCC)   Acute on chronic congestive heart failure (HCC)   COPD exacerbation (HCC)   CML (chronic myelocytic leukemia) ([place])   LOS: 1 day   A & P  Acute hypoxic respiratory failure: The patient continues to require intermittent support from BIPAP. Likely due to a combination of CHF, volume overload, COPD, right lower lobe consolidate, and atrial fibrillation. Diuresis and rate controlling medications are limited due to the patient's hypotension. Continue steroids and nebulizers.   COPD with acute exacerbation: Pt is receiving IV steroids, nebs, and antibiotics. CXR is pending. Off of BIPAP, but dyspneic.   Acute on chronic diastolic CHF: Pt with wheezes and rales at bases. Report orthopnea. BMP, CXR, and Echocardiogram are pending. Diuresis is limited by low blood pressures. BNP elevated. Heart failure team has been consulted.  Atrial fibrillation with RVR: Rate 86 on telemetry. Heart failure team has been consutled. She has been continued on Eliquis. Metoprolol and Diltiazem have been continued. Rate is controlled.  Hypotension: Improved. With leukocytosis, left shift. Concern for sepsis. CXR demonstrated right lower lobe consolidated.  CML:  CBC appears stable. Leukocytosis likely due to CML. Follows with oncology and recently started on [place].   Thrombocytosis: Chronic. Likely related to CML.   I have seen and examined this patient myself. I have spent 34 minutes in her evaluation and care.  DVT prophylaxis: Eliquis  Code Status: DNR, confirmed  with patient  Family Communication: None available. Disposition Plan: From SNF. Likely back to SNF in 3-4 days. Barrier's to discharge: Clinical improvement in atrial fibrillation, CHF, and COPD with resolution of acute respiratory failure.    Kristi Hyer, DO [facility] Direct contact: see [email or web address]  7PM-7AM contact night coverage as above 08/26/2019, 2:06 PM  LOS: 0 days

## 2019-08-27 ENCOUNTER — Inpatient Hospital Stay (HOSPITAL_COMMUNITY): Payer: Medicare Other

## 2019-08-27 ENCOUNTER — Inpatient Hospital Stay: Payer: Self-pay

## 2019-08-27 ENCOUNTER — Other Ambulatory Visit (HOSPITAL_COMMUNITY): Payer: Self-pay | Admitting: *Deleted

## 2019-08-27 DIAGNOSIS — J9601 Acute respiratory failure with hypoxia: Secondary | ICD-10-CM

## 2019-08-27 DIAGNOSIS — C9210 Chronic myeloid leukemia, BCR/ABL-positive, not having achieved remission: Secondary | ICD-10-CM

## 2019-08-27 DIAGNOSIS — I5033 Acute on chronic diastolic (congestive) heart failure: Secondary | ICD-10-CM

## 2019-08-27 LAB — URINALYSIS, ROUTINE W REFLEX MICROSCOPIC
Bilirubin Urine: NEGATIVE
Glucose, UA: NEGATIVE mg/dL
Hgb urine dipstick: NEGATIVE
Ketones, ur: NEGATIVE mg/dL
Leukocytes,Ua: NEGATIVE
Nitrite: NEGATIVE
Protein, ur: NEGATIVE mg/dL
Specific Gravity, Urine: 1.015 (ref 1.005–1.030)
pH: 5 (ref 5.0–8.0)

## 2019-08-27 LAB — COOXEMETRY PANEL
Carboxyhemoglobin: 1.7 % — ABNORMAL HIGH (ref 0.5–1.5)
Methemoglobin: 1 % (ref 0.0–1.5)
O2 Saturation: 64.6 %
Total hemoglobin: 7.4 g/dL — ABNORMAL LOW (ref 12.0–16.0)

## 2019-08-27 LAB — BASIC METABOLIC PANEL
Anion gap: 10 (ref 5–15)
BUN: 52 mg/dL — ABNORMAL HIGH (ref 8–23)
CO2: 30 mmol/L (ref 22–32)
Calcium: 9 mg/dL (ref 8.9–10.3)
Chloride: 98 mmol/L (ref 98–111)
Creatinine, Ser: 1.65 mg/dL — ABNORMAL HIGH (ref 0.44–1.00)
GFR calc Af Amer: 33 mL/min — ABNORMAL LOW (ref >60)
GFR calc non Af Amer: 29 mL/min — ABNORMAL LOW (ref >60)
Glucose, Bld: 165 mg/dL — ABNORMAL HIGH (ref 70–99)
Potassium: 5.3 mmol/L — ABNORMAL HIGH (ref 3.5–5.1)
Sodium: 138 mmol/L (ref 135–145)

## 2019-08-27 LAB — PATHOLOGIST SMEAR REVIEW

## 2019-08-27 LAB — ECHOCARDIOGRAM LIMITED: Weight: 3142.88 oz

## 2019-08-27 LAB — PROCALCITONIN: Procalcitonin: <0.1 ng/mL

## 2019-08-27 MED ORDER — SODIUM CHLORIDE 0.9% FLUSH: 10.0000 mL | INTRAVENOUS | Status: DC | PRN

## 2019-08-27 MED ORDER — APIXABAN 2.5 MG PO TABS
2.5000 mg | ORAL_TABLET | Freq: Two times a day (BID) | ORAL | Status: DC
  Administered 2019-08-27 – 2019-08-29 (×5): 2.5 mg via ORAL
  Filled 2019-08-27 (×4): qty 1

## 2019-08-27 MED ORDER — CHLORHEXIDINE GLUCONATE CLOTH 2 % EX PADS
6.0000 | MEDICATED_PAD | Freq: Every day | CUTANEOUS | Status: DC
  Administered 2019-08-28 – 2019-09-01 (×6): 6 via TOPICAL

## 2019-08-27 MED ORDER — SODIUM CHLORIDE 0.9% FLUSH
10.0000 mL | Freq: Two times a day (BID) | INTRAVENOUS | Status: DC
  Administered 2019-08-28 – 2019-09-01 (×6): 10 mL

## 2019-08-27 NOTE — Consult Note (Addendum)
Advanced Heart Failure Team Consult Note   Primary Physician: Frances Maywood, FNP PCP-Cardiologist:  [name], MD  Reason for Consultation: Acute on chronic diastolic heart failure  HPI:    Nicole [name] is seen today for evaluation of acute on chronic diastolic heart failure at the request of Dr. Benny Lennert, Internal Medicine.    Nicole [name] is a 84 y.o. with history of COPD (9 pack years, quit April 2014), atrial fibrillation s/p AVR with pericardial valve, mitral annuloplasty, MAZE procedure and clipping of the LA appendage (02/2013) and chronic diastolic CHF. Daughter is Personnel officer (CCU nurse).   She went back into atrial fibrillation around 5/16.  Given increased symptoms when in atrial fibrillation, she was started on amiodarone and planned for DCCV after amiodarone load. She went out of atrial fibrillation and into a stable junctional rhythm with rate in the 60s.  She also developed subclinical hyperthyroidism.  Amiodarone was discontinued.  She saw an endocrinologist who recommended that she take methimazole.  However, she never started it.  No BRBPR or melena on Xarelto.   She went back into atrial fibrillation again in 1/18.  We decided to give her a trial of DCCV without starting an antiarrhythmic.  DCCV was attempted in 1/18 but failed. She remains in atrial fibrillation today.   Recently, pt's platelets were noted to be > 1,000K.  She was seen by Dr. Heber [place] and diagnosed with CML.  She recently started treatment w/ Gleevec. Per literature review, caries 60-80% risk of edema and 1% risk of "cardiac failure".  She was admitted to [facility] in 1/21 with COPD + CHF exacerbation.  She received a dose of IV Lasix and treatment for CHF.  Echo in 1/21 showed EF 55-60%, mildly dilated RV, stable repaired mitral valve and bioprosthetic aortic valve.   Readmitted again to [facility] earlier this month for acute hypoxic respiratory failure 2/2 PNA and COPDE complicated by rapid afib. Treated  w/ abx. Echo repeated showing normal LVEF 60-65%. RV mildy enlarged w/ low normal systolic function. Was discharged to SNF.   Presented to [facility] 3/27 from SNF w/ increased dyspnea. In respiratory distress and hypoxic on arrival and placed on BiPAP. Noted to be in rapid afib, rates in the 150s. Started on IV Cardizem. CXR showed RLL consolidation concerning for PNA and abx initiated. Also felt to have acute COPDE, receiving IV steroids and nebs. IV diuretics started for acute CHF but diuresis limited by hypotension and AKI. SBPs in the low 90s. SCr has increased from 1.07>>1.21>>1.65 (baseline <1.0).  [facility] consulted for further recommendations. 2D echo completed today. Results pending. Also had chest CT done today showing moderate right pleural effusion and small left pleural effusion. She is currently w/ increased supp O2 requirements. Currently on 5L [place] (home baseline is 3L/min). Prior to admit, endorsed marked physical limitation w/ NYHA Class IIIb symptoms.    Echo 08/02/19 Left ventricular ejection fraction, by estimation, is 60 to 65%. The left ventricle has normal function. The left ventricle has no regional wall motion abnormalities. There is mild basal septal hypertrophy left ventricular hypertrophy. Left ventricular diastolic parameters are indeterminate. 2. Right ventricular systolic function is low normal. The right ventricular size is mildly enlarged. There is moderately elevated pulmonary artery systolic pressure. 3. Left atrial size was severely dilated. 4. Right atrial size was severely dilated. 5. 26 mm Sorin 3-D Memo Ring is in the MV anular position. Mild mean gradient across the repaired valve of 6 mmHg. Marland Kitchen The  mitral valve has been repaired/replaced. No evidence of mitral valve regurgitation. 6. 21 mm Edwards Magna-ease pericardial valve is in the AV position. Average mean gradient is measured at 24 mmHg (within range of normal values for valve) The anatomy of the AV is poorly  visualized. The aortic valve has been repaired/replaced. Aortic valve regurgitation is not visualized.  Review of Systems: [y] = yes, [ ]  = no   . General: Weight gain [ ] ; Weight loss [ ] ; Anorexia [ ] ; Fatigue [ ] ; Fever [ ] ; Chills [ ] ; Weakness [ ]   . Cardiac: Chest pain/pressure [ ] ; Resting SOB [ ] ; Exertional SOB [ ] ; Orthopnea [ ] ; Pedal Edema [ ] ; Palpitations [ ] ; Syncope [ ] ; Presyncope [ ] ; Paroxysmal nocturnal dyspnea[ ]   . Pulmonary: Cough [ ] ; Wheezing[ ] ; Hemoptysis[ ] ; Sputum [ ] ; Snoring [ ]   . GI: Vomiting[ ] ; Dysphagia[ ] ; Melena[ ] ; Hematochezia [ ] ; Heartburn[ ] ; Abdominal pain [ ] ; Constipation [ ] ; Diarrhea [ ] ; BRBPR [ ]   . GU: Hematuria[ ] ; Dysuria [ ] ; Nocturia[ ]   . Vascular: Pain in legs with walking [ ] ; Pain in feet with lying flat [ ] ; Non-healing sores [ ] ; Stroke [ ] ; TIA [ ] ; Slurred speech [ ] ;  . Neuro: Headaches[ ] ; Vertigo[ ] ; Seizures[ ] ; Paresthesias[ ] ;Blurred vision [ ] ; Diplopia [ ] ; Vision changes [ ]   . Ortho/Skin: Arthritis [ ] ; Joint pain [ ] ; Muscle pain [ ] ; Joint swelling [ ] ; Back Pain [ ] ; Rash [ ]   . Psych: Depression[ ] ; Anxiety[ ]   . Heme: Bleeding problems [ ] ; Clotting disorders [ ] ; Anemia [ ]   . Endocrine: Diabetes [ ] ; Thyroid dysfunction[ ]   Home Medications Prior to Admission medications   Medication Sig Start Date End Date Taking? Authorizing Provider  albuterol (VENTOLIN HFA) 108 (90 Base) MCG/ACT inhaler Inhale 2 puffs into the lungs as needed.  05/09/19  Yes [provider]  ALPRAZolam Duanne Moron) 0.5 MG tablet Take 1 tablet (0.5 mg total) by mouth 3 (three) times daily as needed for anxiety. 08/09/19  Yes Shah, Pratik D, DO  arformoterol (BROVANA) 15 MCG/2ML NEBU Take 2 mLs (15 mcg total) by nebulization 2 (two) times daily. J 44.9 08/09/19  Yes Shah, Pratik D, DO  atorvastatin (LIPITOR) 40 MG tablet TAKE 1 TABLET BY MOUTH EVERY DAY IN THE EVENING Patient taking differently: Take 40 mg by mouth daily at 6 PM. TAKE 1 TABLET  BY MOUTH EVERY DAY IN THE EVENING 03/01/19  Yes [name], MD  budesonide (PULMICORT) 0.5 MG/2ML nebulizer solution Take 2 mLs (0.5 mg total) by nebulization 2 (two) times daily. J 44.9 08/09/19  Yes Shah, Pratik D, DO  busPIRone (BUSPAR) 10 MG tablet Take 1 tablet (10 mg total) by mouth 2 (two) times daily. Patient taking differently: Take 10 mg by mouth daily.  08/09/19  Yes Shah, Pratik D, DO  diltiazem (CARDIZEM CD) 180 MG 24 hr capsule Take 1 capsule (180 mg total) by mouth daily. 08/10/19 09/09/19 Yes Shah, Pratik D, DO  docusate sodium (COLACE) 100 MG capsule Take 2 capsules (200 mg total) by mouth at bedtime. Patient taking differently: Take 200 mg by mouth at bedtime as needed for mild constipation.  08/09/19  Yes Shah, Pratik D, DO  DULoxetine (CYMBALTA) 20 MG capsule Take 1 capsule (20 mg total) by mouth daily. 06/24/17  Yes Nedrud, Larena Glassman, MD  ELIQUIS 5 MG TABS tablet TAKE 1 TABLET BY MOUTH TWICE A DAY  Patient taking differently: Take 5 mg by mouth 2 (two) times daily.  03/21/19  Yes [name], MD  ergocalciferol (VITAMIN D2) 1.25 MG (50000 UT) capsule Take 1 capsule (50,000 Units total) by mouth once a week. 07/09/19  Yes Derek Jack, MD  ferrous sulfate 325 (65 FE) MG EC tablet Take 325 mg by mouth daily with breakfast.   Yes [provider]  imatinib (GLEEVEC) 100 MG tablet Take 1 tablet (100 mg total) by mouth daily. Take with meals and large glass of water.Caution:Chemotherapy 07/24/19  Yes Derek Jack, MD  metoprolol succinate (TOPROL-XL) 25 MG 24 hr tablet Take 1 tablet (25 mg total) by mouth daily. Patient taking differently: Take 25 mg by mouth in the morning and at bedtime.  08/10/19 09/09/19 Yes Shah, Pratik D, DO  Multiple Vitamins-Minerals (MULTIVITAMIN WITH MINERALS) tablet Take 1 tablet by mouth daily.   Yes [provider]  polyethylene glycol (MIRALAX / GLYCOLAX) 17 g packet Take 17 g by mouth daily as needed for mild constipation.  08/09/19  Yes Shah, Pratik D, DO  potassium chloride SA (KLOR-CON M20) 20 MEQ tablet Take 2 tablets (40 mEq total) by mouth 2 (two) times daily. Patient taking differently: Take 40 mEq by mouth daily.  07/17/19  Yes [name], MD  prochlorperazine (COMPAZINE) 10 MG tablet Take 1 tablet (10 mg total) by mouth every 6 (six) hours as needed for nausea or vomiting. 07/24/19  Yes Derek Jack, MD  torsemide (DEMADEX) 20 MG tablet Take 3 tablets (60 mg total) by mouth daily. Patient taking differently: Take 40-80 mg by mouth See admin instructions. 4 tablets in every morning and 2 tablets every other afternoon 08/10/19 09/09/19 Yes Shah, Pratik D, DO  feeding supplement, ENSURE ENLIVE, (ENSURE ENLIVE) LIQD Take 237 mLs by mouth daily. Patient not taking: Reported on 08/25/2019 08/09/19   Heath Lark D, DO    Past Medical History: Past Medical History:  Diagnosis Date  . Anxiety   . Atrial fibrillation ([place])   . COPD (chronic obstructive pulmonary disease) ([place])   . Diastolic CHF ([place])   . Hyperlipidemia   . SOB (shortness of breath)    conplicated by underlying a fib with RVR,COPD, and CHF  . Valvular heart disease    a.s/p tissue AVR in 2014 with MV ring and maze procedure    Past Surgical History: Past Surgical History:  Procedure Laterality Date  . ABDOMINAL HYSTERECTOMY    . AORTIC VALVE REPLACEMENT N/A 03/05/2013   Procedure: AORTIC VALVE REPLACEMENT (AVR);  Surgeon: Gaye Pollack, MD;  Location: [place];  Service: Open Heart Surgery;  Laterality: N/A;  . BREAST BIOPSY     x3  . CARDIOVERSION N/A 02/13/2013   Procedure: CARDIOVERSION;  Surgeon: [name], MD;  Location: [facility] ENDOSCOPY;  Service: Cardiovascular;  Laterality: N/A;  . CARDIOVERSION N/A 03/01/2013   Procedure: CARDIOVERSION;  Surgeon: [name], MD;  Location[facility] ENDOSCOPY;  Service: Cardiovascular;  Laterality: N/A;  . CARDIOVERSION N/A 06/23/2016   Procedure: CARDIOVERSION;  Surgeon: [name],  MD;  Location: [place];  Service: Cardiovascular;  Laterality: N/A;  . CATARACT EXTRACTION    . COLONOSCOPY WITH PROPOFOL N/A 06/24/2017   Procedure: COLONOSCOPY WITH PROPOFOL;  Surgeon: Carol Ada, MD;  Location: [place];  Service: Endoscopy;  Laterality: N/A;  . ESOPHAGOGASTRODUODENOSCOPY N/A 06/24/2017   Procedure: ESOPHAGOGASTRODUODENOSCOPY (EGD);  Surgeon: Carol Ada, MD;  Location: [place];  Service: Endoscopy;  Laterality: N/A;  . INTRAOPERATIVE  TRANSESOPHAGEAL ECHOCARDIOGRAM N/A 03/05/2013   Procedure: INTRAOPERATIVE TRANSESOPHAGEAL ECHOCARDIOGRAM;  Surgeon: Gaye Pollack, MD;  Location: [facility] OR;  Service: Open Heart Surgery;  Laterality: N/A;  . LEFT AND RIGHT HEART CATHETERIZATION WITH CORONARY ANGIOGRAM N/A 03/02/2013   Procedure: LEFT AND RIGHT HEART CATHETERIZATION WITH CORONARY ANGIOGRAM;  Surgeon: [name], MD;  Location: [facility] CATH LAB;  Service: Cardiovascular;  Laterality: N/A;  . MAZE N/A 03/05/2013   Procedure: MAZE;  Surgeon: Gaye Pollack, MD;  Location: [place];  Service: Open Heart Surgery;  Laterality: N/A;  . MITRAL VALVE REPAIR N/A 03/05/2013   Procedure: MITRAL VALVE REPAIR (MVR);  Surgeon: Gaye Pollack, MD;  Location: [place];  Service: Open Heart Surgery;  Laterality: N/A;  . ROTATOR CUFF REPAIR    . TEE WITHOUT CARDIOVERSION N/A 03/01/2013   Procedure: TRANSESOPHAGEAL ECHOCARDIOGRAM (TEE);  Surgeon: [name], MD;  Location: [place];  Service: Cardiovascular;  Laterality: N/A;  talked to bev. booking no. is [id number] called Nicole [name] to verify time  . TONSILLECTOMY      Family History: Family History  Problem Relation Age of Onset  . Breast cancer Other        family history  . Heart disease Mother   . Throat cancer Father   . Heart disease Brother   . Heart attack Son   . Breast cancer Daughter     Social History: Social History   Socioeconomic History  . Marital status: Divorced    Spouse name: Not on file  . Number of  children: 2  . Years of education: Not on file  . Highest education level: Not on file  Occupational History  . Occupation: retired  Tobacco Use  . Smoking status: Former Smoker    Packs/day: 1.00    Years: 57.00    Pack years: 57.00    Types: Cigarettes    Start date: 06/12/1955    Quit date: 11/28/2012    Years since quitting: 6.7  . Smokeless tobacco: Never Used  . Tobacco comment: Quit in July 2014  Substance and Sexual Activity  . Alcohol use: No    Alcohol/week: 0.0 standard drinks  . Drug use: No  . Sexual activity: Never  Other Topics Concern  . Not on file  Social History Narrative  . Not on file   Social Determinants of Health   Financial Resource Strain: Low Risk   . Difficulty of Paying Living Expenses: Not hard at all  Food Insecurity: No Food Insecurity  . Worried About Charity fundraiser in the Last Year: Never true  . Ran Out of Food in the Last Year: Never true  Transportation Needs: No Transportation Needs  . Lack of Transportation (Medical): No  . Lack of Transportation (Non-Medical): No  Physical Activity: Inactive  . Days of Exercise per Week: 0 days  . Minutes of Exercise per Session: 0 min  Stress: No Stress Concern Present  . Feeling of Stress : Not at all  Social Connections: Somewhat Isolated  . Frequency of Communication with Friends and Family: More than three times a week  . Frequency of Social Gatherings with Friends and Family: More than three times a week  . Attends Religious Services: 1 to 4 times per year  . Active Member of Clubs or Organizations: No  . Attends Archivist Meetings: Never  . Marital Status: Divorced    Allergies:  No Known Allergies  Objective:    Vital Signs:  Temp:  [97.9 F (36.6 C)-98.4 F (36.9 C)] 97.9 F (36.6 C) (03/29 0815) Pulse Rate:  [76-109] 78 (03/29 0500) Resp:  [16-22] 16 (03/29 0359) BP: (91-104)/(52-74) 92/58 (03/29 0815) SpO2:  [94 %-99 %] 94 % (03/29 0359) FiO2 (%):  [40 %]  40 % (03/29 0500) Weight:  [89.1 kg] 89.1 kg (03/29 0500) Last BM Date: 08/26/19  Weight change: Filed Weights   08/25/19 0538 08/26/19 0400 08/27/19 0500  Weight: 85.9 kg 86.2 kg 89.1 kg    Intake/Output:  No intake or output data in the 24 hours ending 08/27/19 1623    Physical Exam    General:  Chronically ill appearing, moderately obese, elderly WF. On high flow [place]. No increased WOB HEENT: normal Neck: supple. Elevated JVP to ear . Carotids 2+ bilat; no bruits. No lymphadenopathy or thyromegaly appreciated. Cor: PMI nondisplaced. Irregularly irregular rhythm, regular rate. No rubs, gallops or murmurs. Lungs: decreased BS bilaterally at the bases  Abdomen: obese, w/ large ventral hernia (soft and reducible)  nontender, nondistended. No hepatosplenomegaly. No bruits or masses. Good bowel sounds. Extremities: no cyanosis, clubbing, rash, edema Neuro: alert & orientedx3, cranial nerves grossly intact. moves all 4 extremities w/o difficulty. Affect pleasant   Telemetry   Atrial fibrillation 80s   EKG    Atrial fibrillation, 103 bpm   Labs   Basic Metabolic Panel: Recent Labs  Lab 08/25/19 1555 08/26/19 0218 08/27/19 0242  NA 140 137 138  K 3.6 4.5 5.3*  CL 94* 97* 98  CO2 32 29 30  GLUCOSE 148* 187* 165*  BUN 18 29* 52*  CREATININE 1.07* 1.21* 1.65*  CALCIUM 9.2 9.0 9.0    Liver Function Tests: No results for input(s): AST, ALT, ALKPHOS, BILITOT, PROT, ALBUMIN in the last 168 hours. No results for input(s): LIPASE, AMYLASE in the last 168 hours. No results for input(s): AMMONIA in the last 168 hours.  CBC: Recent Labs  Lab 08/25/19 1555 08/26/19 0218  WBC 13.6* 17.1*  NEUTROABS 12.0* 15.4*  HGB 8.5* 8.0*  HCT 28.8* 27.3*  MCV 85.5 87.2  PLT 1,633* 1,393*    Cardiac Enzymes: No results for input(s): CKTOTAL, CKMB, CKMBINDEX, TROPONINI in the last 168 hours.  BNP: BNP (last 3 results) Recent Labs    07/28/19 1600 07/30/19 0454 08/01/19 0528    BNP 851.0* 822.0* 737.0*    ProBNP (last 3 results) No results for input(s): PROBNP in the last 8760 hours.   CBG: No results for input(s): GLUCAP in the last 168 hours.  Coagulation Studies: No results for input(s): LABPROT, INR in the last 72 hours.   Imaging   CT CHEST WO CONTRAST  Result Date: 08/27/2019 CLINICAL DATA:  Hypoxemia EXAM: CT CHEST WITHOUT CONTRAST TECHNIQUE: Multidetector CT imaging of the chest was performed following the standard protocol without IV contrast. COMPARISON:  CT chest angiogram, 06/24/2019 FINDINGS: Cardiovascular: Aortic atherosclerosis. Status post aortic and mitral valve repair. Cardiomegaly. Scattered coronary artery calcifications. No pericardial effusion. Mediastinum/Nodes: No enlarged mediastinal, hilar, or axillary lymph nodes. Large left-sided multinodular goiter with significant substernal component. Trachea, and esophagus demonstrate no significant findings. Lungs/Pleura: Minimal centrilobular and paraseptal emphysema. Moderate right, small left pleural effusions with associated atelectasis or consolidation, effusions increased compared to prior CT dated 06/24/2019. No pleural effusion or pneumothorax. Upper Abdomen: No acute abnormality. Large, partially imaged ventral midline abdominal hernia containing stomach and transverse colon. Musculoskeletal: No chest wall mass or suspicious bone lesions identified. IMPRESSION: 1. Moderate right, small left pleural effusions with  associated atelectasis or consolidation, increased compared to prior CT dated 06/24/2019. 2. Minimal emphysema.  Emphysema (ICD10-J43.9). 3. Cardiomegaly and scattered coronary artery calcifications. 4. Aortic Atherosclerosis (ICD10-I70.0). 5. Large left-sided multinodular thyroid goiter with significant substernal component. 6. Large, partially imaged ventral midline abdominal hernia containing stomach and transverse colon. Electronically Signed   By: Eddie Candle M.D.   On:  08/27/2019 12:41      Medications:     Current Medications: . apixaban  2.5 mg Oral BID  . arformoterol  15 mcg Nebulization BID  . atorvastatin  40 mg Oral q1800  . budesonide  0.5 mg Nebulization BID  . busPIRone  10 mg Oral BID  . diltiazem  180 mg Oral Daily  . DULoxetine  20 mg Oral Daily  . imatinib  100 mg Oral Q breakfast  . metoprolol succinate  25 mg Oral Daily  . predniSONE  40 mg Oral Q breakfast  . sodium chloride flush  3 mL Intravenous Q12H     Infusions: . sodium chloride    . azithromycin 500 mg (08/26/19 2034)        Assessment/Plan   1. Acute on Chronic Diastolic CHF:  - Most recent echo, prior to this admit 08/02/19, showed normal LVEF 60-65%, mild basal septal hypertrophy and left ventricular hypertrophy, bi atrial enlargement, mildly dilated RV w/ low normal systolic function. - Admitted for NYHA Class IIIb symptoms/ acute hypoxic respiratory failure. Evidence of volume overload on exam + bilateral pleural effusions on chest CT, R>L  - ? If volume overload triggered by recent initiation of therapy for CML recently started treatment w/ Gleevec. Per literature review, caries 60-80% risk of edema and 1% risk of "cardiac failure - attempts at diuresis limited by borderline hypotension (SBP low 90s) and worsening renal function/AKI (SCr 1.07>>1.21>>1.65) - Repeat echo pending to reassess LVEF  - Recommend placement of PICC line to check co-ox and CVP - w/ chronic diastolic HF, h/o AS, chronic afib, LVH, biatral enlargement and CML, need w/u to r/o amyloid. Recommend PYP scan given SCr > 1.5.  - May need RHC to further evaluate.  Recent echo showed moderately elevated pulmonary artery systolic pressure, 48 mmHg.     2. CML:  - followed by Dr. Heber [place]. Recently diagnosed - on treatment w/ imatinib (Gleevec) (tyrosine kinase inhibitor) - given potential cardiovascular side effects, may need to hold but will need to discuss w/ oncology   3. Acute Hypoxic  Respiratory Failure  - Multifactorial 2/2 acute on chronic diastolic CHF, Pleural Effusion +  COPDE - abx+ steroids + nebs for COPD. Management per primary  - HF management as outlined above - May need thoracentesis for moderate Rt sided pleural effusion  4. Chronic Atrial Fibrillation - rate controlled. Continue Cardizem and metoprolol. If EF reduced, will need to d/c CCB.  - continue Eliquis for a/c  5. Valvular Heart Disease:  -  Severe aortic stenosis and moderate MR, s/p AVR (tissue) and MV ring with Maze procedure 03/15/13.  - Stable valves on 3/21 echo.      I will place orders for PICC Line, co-ox and CVP monitoring. MD to follow w/ further recommendations.    Length of Stay: 2  Lyda Jester, PA-C  08/27/2019, 4:23 PM  Advanced Heart Failure Team Pager [phone] (M-F; 7a - 4p)  Please contact [facility] for night-coverage after hours (4p -7a ) and weekends on [email or web address]  Patient seen and examined with the above-signed Advanced Practice Provider and/or Housestaff. I  personally reviewed laboratory data, imaging studies and relevant notes. I independently examined the patient and formulated the important aspects of the plan. I have edited the note to reflect any of my changes or salient points. I have personally discussed the plan with the patient and/or family.  Difficult situation. Suspect dyspnea is multifactorial   Echo shows normal EF with moderate RV dysfunction.   CT chest shows moderate to large right effusion with small left effusion. No convincing infiltrate. PCT < 0.10. No BNP on chart.   On exam looks mildly volume overloaded but attempts at IV diuresis limited by AKI and hypotension.   On exam Elderly woman lying in bed NAD JVP to jaw Cor IRR Lungs markedly decrease R base o/w clear Ab soft NT Ext 1 + ankle edema  We will give lasix 80 IV and assess response. Check BNP. Agree with PICC line and co-ox. Would likely benefit from IR tap of R effusion.    We will follow with you.   Glori Bickers, MD  11:38 PM

## 2019-08-27 NOTE — Progress Notes (Signed)
Lasix ordered for client but SBP 88, highest SBP-105, HR-90=110, asymptomatic, alert and well related , Xanax given for associated anxiety . Dr. Silas [place] made aware will continue to monitor BP and status.

## 2019-08-27 NOTE — Progress Notes (Signed)
BP remains borderline low, 103/78 , P-76,no distress breathing easily while on BIPAP.

## 2019-08-27 NOTE — Consult Note (Signed)
   [facility] CM Inpatient Consult   08/27/2019  Evanthia Cartelli 03/19/1936 [id number]   Patient screened for extreme high risk score for unplanned readmission for less than 30 days readmission hospitalizations in the [place] and to assess for [facility] service needs.  Review of patient's medical record reveals patient is from [place] and was from [facility] skilled facility and to return per notes at this time.  Primary Care Provider is Frances Maywood, FNP is noted as provider.  However, patient with a HX with [facility] with Dr. [name].  Plan:  Follow with inpatient [facility] team as disposition is not completed at this time. Continue to follow progress and disposition to assess for post hospital care management needs.    For questions contact:   Natividad Brood, RN BSN [facility] Liaison  [phone] business mobile phone Toll free office [phone]  Fax number: [phone] [email or web address]

## 2019-08-27 NOTE — TOC Initial Note (Signed)
Transition of Care [facility]) - Initial/Assessment Note    Patient Details  Name: Nicole [name] MRN: [id number] Date of Birth: Oct 28, 1935  Transition of Care [facility]) CM/SW Contact:    Angelita Ingles, RN Phone Number: 08/27/2019, 4:10 PM  Clinical Narrative:   Patient reports that she is from [facility] where she will return for 2 more weeks of rehab to help her gain her strength. Patient states that she has a good support system because her daughter is a Marine scientist and she will help with her care once she builds her strength and returns home. No home needs are identified at this time,  patient will be returning to rehab. Will continue to follow for any further needs.                        Patient Goals and CMS Choice        Expected Discharge Plan and Services                                                Prior Living Arrangements/Services                       Activities of Daily Living      Permission Sought/Granted                  Emotional Assessment              Admission diagnosis:  Acute respiratory failure with hypoxia ([place]) [J96.01] Patient Active Problem List   Diagnosis Date Noted  . Acute exacerbation of CHF (congestive heart failure) ([place])   . Pleural effusion   . Sepsis ([place])   . DNR (do not resuscitate) 08/01/2019  . Acute respiratory failure with hypoxia ([place])   . HCAP (healthcare-associated pneumonia) 07/27/2019  . CML (chronic myelocytic leukemia) ([place]) 07/09/2019  . Acute on chronic congestive heart failure ([place])   . COPD exacerbation ([place])   . Thyroid enlarged   . Class 2 obesity due to excess calories with body mass index (BMI) of 36.0 to 36.9 in adult   . SOB (shortness of breath) 06/25/2019  . Dyspnea 06/24/2019  . Thrombocytosis ([place]) 06/11/2019  . Chronic back pain 06/24/2017  . Symptomatic anemia 06/23/2017  . Hyperthyroidism 08/05/2015  . Chronic diastolic CHF (congestive heart failure) ([place]) 02/28/2013   . Aortic stenosis 02/28/2013  . CAD (coronary artery disease) 02/11/2013  . Acute on chronic diastolic HF (heart failure) ([place]) 02/11/2013  . Atrial fibrillation with RVR ([place]) 02/11/2013   PCP:  Frances Maywood, FNP Pharmacy:   [place], [place] [address] [place] [address] Phone: [phone] Fax: [phone]  [place], [place] [place] [address] Phone: [phone] Fax: [phone]     Social Determinants of Health ([place]) Interventions    Readmission Risk Interventions Readmission Risk Prevention Plan 08/27/2019  Transportation Screening Complete  Medication Review (RN Care Manager) Referral to Pharmacy  PCP or Specialist appointment within 3-5 days of discharge Complete  Skilled [place] Complete  Some recent data might be hidden

## 2019-08-27 NOTE — Progress Notes (Signed)
Peripherally Inserted Central Catheter Placement  The IV Nurse has discussed with the patient and/or persons authorized to consent for the patient, the purpose of this procedure and the potential benefits and risks involved with this procedure.  The benefits include less needle sticks, lab draws from the catheter, and the patient may be discharged home with the catheter. Risks include, but not limited to, infection, bleeding, blood clot (thrombus formation), and puncture of an artery; nerve damage and irregular heartbeat and possibility to perform a PICC exchange if needed/ordered by physician.  Alternatives to this procedure were also discussed.  Bard Power PICC patient education guide, fact sheet on infection prevention and patient information card has been provided to patient /or left at bedside.    PICC Placement Documentation  PICC Double Lumen [id number] PICC Right Basilic 40 cm 0 cm (Active)  Indication for Insertion or Continuance of Line Chronic illness with exacerbations (CF, Sickle Cell, etc.) 08/27/19 2040  Exposed Catheter (cm) 0 cm 08/27/19 2040  Site Assessment Clean;Dry;Intact 08/27/19 2040  Lumen #1 Status Blood return noted;Flushed;Saline locked 08/27/19 2040  Lumen #2 Status Blood return noted;Flushed;Saline locked 08/27/19 2040  Dressing Type Transparent;Occlusive;Securing device 08/27/19 2040  Dressing Status Clean;Dry;Intact;Antimicrobial disc in place 08/27/19 2040  Line Adjustment (NICU/IV Team Only) No 08/27/19 2040  Dressing Intervention New dressing 08/27/19 2040  Dressing Change Due 08/29/19 08/27/19 2040       Edson Snowball 08/27/2019, 8:56 PM

## 2019-08-27 NOTE — Progress Notes (Signed)
Patient indicated that she does not use PPV at home or have OSA.  She would prefer not to go on the V60 tonight but will request it if she becomes SOB.

## 2019-08-27 NOTE — Progress Notes (Signed)
PROGRESS NOTE  Nicole [name] [id number] DOB: May 14, 1936 DOA: 08/25/2019 PCP: Frances Maywood, FNP  Brief History    Nicole [name] is a 84 y.o. female with medical history significant for CML on Gleevec, COPD, chronic diastolic CHF, bioprosthetic AVR, and atrial fibrillation on Eliquis, now presenting to the emergency department for evaluation of shortness of breath.  Patient reports progressive dyspnea over the past 3 days associated with wheezing, orthopnea, and perhaps some mild lower extremity swelling.  Patient denies any fevers or chills, reports being treated for pneumonia 1 month ago but making a full recovery from that.  She denies any chest pain or palpitations.  She appeared to be in respiratory distress last night at her SNF, was given 40 mg torsemide and neb treatment, and transported to the ED.  ED Course: Upon arrival to the ED, patient is found to be afebrile, saturating mid 80s on room air, and acute respiratory distress, respiratory rate in the 30s, heart rate in the 150s, and stable blood pressure.  EKG features atrial fibrillation with rate 151.  BNP 700 range.  Procalcitonin 0.06.  CBC with chronic leukocytosis, anemia, and thrombocytosis.  Patient was placed on BiPAP, treated with Rocephin and azithromycin, given 5 mg IV bolus of diltiazem, albuterol neb, and seem to make fairly good improvement with this.  Transfer to [facility] was arranged for ongoing evaluation and management.  The patient has been admitted to a telemetry bed. Pt remains in atrial fibrillation with RVR, although rate on current EKG is 103. Use of rate limiting medications has been limited by the patient's blood pressure Diuretics have been continued, but have been limited by the patient's low blood pressures. EKG, BMP, Troponins, CXR, and echocardiogram have been ordered. Heart failure team has been consulted.  Consultants  . Heart Failure Team  Procedures  . None  Antibiotics   Anti-infectives  (From admission, onward)   Start     Dose/Rate Route Frequency Ordered Stop   08/25/19 1930  azithromycin (ZITHROMAX) 500 mg in sodium chloride 0.9 % 250 mL IVPB     500 mg 250 mL/hr over 60 Minutes Intravenous Every 24 hours 08/25/19 0649       Subjective  Pt continues to require BIPAP at night. No new complaints.  Objective   Vitals:  Vitals:   08/27/19 0500 08/27/19 0815  BP: (!) 95/57 (!) 92/58  Pulse: 78   Resp:    Temp:  97.9 F (36.6 C)  SpO2:     Exam:  Constitutional:  . The patient is awake, alert, and oriented x 3. No acute distress. Respiratory:  . No increased work of breathing.  . No wheezes, rales, or rhonchi . No tactile fremitus Cardiovascular:  . Irregular rate and rhythm. Rate is fast. . No murmurs, ectopy, or gallups. . No lateral PMI. No thrills. Abdomen:  . Abdomen is soft, non-tender, non-distended . No hernias, masses, or organomegaly . Normoactive bowel sounds.  Musculoskeletal:  . No cyanosis, clubbing, or edema Skin:  . No rashes, lesions, ulcers . palpation of skin: no induration or nodules Neurologic:  . CN 2-12 intact . Sensation all 4 extremities intact Psychiatric:  . Mental status o Mood, affect appropriate o Orientation to person, place, time  . judgment and insight appear intact  I have personally reviewed the following:   Today's Data  . Vitals, BMP, and CBC  Imaging  . CXR: Pulmonary vascular congestion . Right lower lobe consolidate.  Cardiology Data  . EKG:  Atrial Fibrillation with RVR 08/25/2019 . Echocardiogram: Pending .  Scheduled Meds: . apixaban  2.5 mg Oral BID  . arformoterol  15 mcg Nebulization BID  . atorvastatin  40 mg Oral q1800  . budesonide  0.5 mg Nebulization BID  . busPIRone  10 mg Oral BID  . diltiazem  180 mg Oral Daily  . DULoxetine  20 mg Oral Daily  . imatinib  100 mg Oral Q breakfast  . metoprolol succinate  25 mg Oral Daily  . predniSONE  40 mg Oral Q breakfast  . sodium chloride  flush  3 mL Intravenous Q12H   Continuous Infusions: . sodium chloride    . azithromycin 500 mg (08/26/19 2034)    Principal Problem:   Acute respiratory failure with hypoxia (HCC) Active Problems:   CAD (coronary artery disease)   Atrial fibrillation with RVR (HCC)   Acute on chronic congestive heart failure (HCC)   COPD exacerbation (HCC)   CML (chronic myelocytic leukemia) ([place])   LOS: 2 days   A & P  Acute hypoxic respiratory failure: The patient continues to require intermittent support from BIPAP. Likely due to a combination of CHF, volume overload, COPD, right lower lobe consolidate, and atrial fibrillation. Diuresis and rate controlling medications are limited due to the patient's hypotension. Continue steroids and nebulizers.   COPD with acute exacerbation: Pt is receiving IV steroids, nebs, and antibiotics. CXR is pending. Off of BIPAP, but dyspneic.   Acute on chronic diastolic CHF: Pt with wheezes and rales at bases. Report orthopnea. BMP, CXR, and Echocardiogram are pending. Diuresis is limited by low blood pressures. BNP elevated. Heart failure team has been consulted.  Moderate left pleural effusion: Present on CT. Also small right pleural effusion. Will consult IR regarding thoracentesis.   Large left sided multinodular goiter with significant substernal component: Will need outpatient follow up.  Large midline abdominal hernia: CT Chest contains stomach and transverse colon. Noted.  Atrial fibrillation with RVR: Rate 86 on telemetry. Heart failure team has been consutled. She has been continued on Eliquis. Metoprolol and Diltiazem have been continued. Rate is controlled.  Hypotension: Improved. With leukocytosis, left shift. Concern for sepsis. CXR demonstrated right lower lobe consolidated.  CML:  CBC appears stable. Leukocytosis likely due to CML. Follows with oncology and recently started on [place].   Thrombocytosis: Chronic. Likely related to CML.   I  have seen and examined this patient myself. I have spent 34 minutes in her evaluation and care.  DVT prophylaxis: Eliquis  Code Status: DNR, confirmed with patient  Family Communication: . Disposition Plan: From SNF. Likely back to SNF in 3-4 days. Barrier's to discharge: Clinical improvement in atrial fibrillation, CHF, and COPD with resolution of acute respiratory failure.    Nehemyah Foushee, DO [facility] Direct contact: see [email or web address]  7PM-7AM contact night coverage as above 08/27/2019, 2:06 PM  LOS: 0 days

## 2019-08-27 NOTE — Progress Notes (Signed)
  Echocardiogram 2D Echocardiogram has been performed.  Nicole [name] 08/27/2019, 2:54 PM

## 2019-08-28 ENCOUNTER — Inpatient Hospital Stay (HOSPITAL_COMMUNITY): Payer: Medicare Other

## 2019-08-28 ENCOUNTER — Ambulatory Visit (HOSPITAL_COMMUNITY): Payer: Medicare Other | Admitting: Hematology

## 2019-08-28 ENCOUNTER — Telehealth: Payer: Medicare Other | Admitting: Student

## 2019-08-28 ENCOUNTER — Inpatient Hospital Stay: Payer: Self-pay

## 2019-08-28 HISTORY — PX: IR THORACENTESIS ASP PLEURAL SPACE W/IMG GUIDE: IMG5380

## 2019-08-28 LAB — PROCALCITONIN: Procalcitonin: <0.1 ng/mL

## 2019-08-28 LAB — BASIC METABOLIC PANEL
Anion gap: 10 (ref 5–15)
BUN: 54 mg/dL — ABNORMAL HIGH (ref 8–23)
CO2: 28 mmol/L (ref 22–32)
Calcium: 9 mg/dL (ref 8.9–10.3)
Chloride: 100 mmol/L (ref 98–111)
Creatinine, Ser: 1.52 mg/dL — ABNORMAL HIGH (ref 0.44–1.00)
GFR calc Af Amer: 37 mL/min — ABNORMAL LOW (ref >60)
GFR calc non Af Amer: 32 mL/min — ABNORMAL LOW (ref >60)
Glucose, Bld: 128 mg/dL — ABNORMAL HIGH (ref 70–99)
Potassium: 4.9 mmol/L (ref 3.5–5.1)
Sodium: 138 mmol/L (ref 135–145)

## 2019-08-28 LAB — CBC
HCT: 24.6 % — ABNORMAL LOW (ref 36.0–46.0)
HCT: 26 % — ABNORMAL LOW (ref 36.0–46.0)
Hemoglobin: 7.2 g/dL — ABNORMAL LOW (ref 12.0–15.0)
Hemoglobin: 7.7 g/dL — ABNORMAL LOW (ref 12.0–15.0)
MCH: 25.6 pg — ABNORMAL LOW (ref 26.0–34.0)
MCH: 25.5 pg — ABNORMAL LOW (ref 26.0–34.0)
MCHC: 29.3 g/dL — ABNORMAL LOW (ref 30.0–36.0)
MCHC: 29.6 g/dL — ABNORMAL LOW (ref 30.0–36.0)
MCV: 87.5 fL (ref 80.0–100.0)
MCV: 86.1 fL (ref 80.0–100.0)
Platelets: 1516 10*3/uL (ref 150–400)
Platelets: 1594 10*3/uL (ref 150–400)
RBC: 2.81 MIL/uL — ABNORMAL LOW (ref 3.87–5.11)
RBC: 3.02 MIL/uL — ABNORMAL LOW (ref 3.87–5.11)
RDW: 17.1 % — ABNORMAL HIGH (ref 11.5–15.5)
RDW: 16.9 % — ABNORMAL HIGH (ref 11.5–15.5)
WBC: 13.3 10*3/uL — ABNORMAL HIGH (ref 4.0–10.5)
WBC: 14.5 10*3/uL — ABNORMAL HIGH (ref 4.0–10.5)
nRBC: 0 % (ref 0.0–0.2)
nRBC: 0.1 % (ref 0.0–0.2)

## 2019-08-28 LAB — ALBUMIN: Albumin: 3.1 g/dL — ABNORMAL LOW (ref 3.5–5.0)

## 2019-08-28 LAB — GRAM STAIN

## 2019-08-28 LAB — BODY FLUID CELL COUNT WITH DIFFERENTIAL
Eos, Fluid: 0 %
Lymphs, Fluid: 80 %
Monocyte-Macrophage-Serous Fluid: 11 % — ABNORMAL LOW (ref 50–90)
Neutrophil Count, Fluid: 9 % (ref 0–25)
Total Nucleated Cell Count, Fluid: 203 cu mm (ref 0–1000)

## 2019-08-28 LAB — PROTEIN, TOTAL: Total Protein: 5.4 g/dL — ABNORMAL LOW (ref 6.5–8.1)

## 2019-08-28 LAB — LACTATE DEHYDROGENASE: LDH: 288 U/L — ABNORMAL HIGH (ref 98–192)

## 2019-08-28 LAB — COOXEMETRY PANEL
Carboxyhemoglobin: 1.2 % (ref 0.5–1.5)
Methemoglobin: 0.4 % (ref 0.0–1.5)
O2 Saturation: 50.8 %
Total hemoglobin: 7.3 g/dL — ABNORMAL LOW (ref 12.0–16.0)

## 2019-08-28 LAB — PROTEIN, PLEURAL OR PERITONEAL FLUID: Total protein, fluid: <3 g/dL

## 2019-08-28 LAB — SARS CORONAVIRUS 2 (TAT 6-24 HRS): SARS Coronavirus 2: NEGATIVE

## 2019-08-28 LAB — ALBUMIN, PLEURAL OR PERITONEAL FLUID: Albumin, Fluid: <1 g/dL

## 2019-08-28 LAB — BRAIN NATRIURETIC PEPTIDE: B Natriuretic Peptide: 517.7 pg/mL — ABNORMAL HIGH (ref 0.0–100.0)

## 2019-08-28 MED ORDER — LIDOCAINE HCL (PF) 1 % IJ SOLN
INTRAMUSCULAR | Status: DC | PRN
  Administered 2019-08-28: 10 mL

## 2019-08-28 MED ORDER — FUROSEMIDE 10 MG/ML IJ SOLN
80.0000 mg | Freq: Two times a day (BID) | INTRAMUSCULAR | Status: DC
  Administered 2019-08-28 – 2019-08-30 (×4): 80 mg via INTRAVENOUS
  Filled 2019-08-28 (×4): qty 8

## 2019-08-28 MED ORDER — MILRINONE LACTATE IN DEXTROSE 20-5 MG/100ML-% IV SOLN: 0.1250 ug/kg/min | INTRAVENOUS | Status: DC

## 2019-08-28 MED ORDER — METOLAZONE 2.5 MG PO TABS: 2.5000 mg | ORAL_TABLET | Freq: Once | ORAL | Status: DC

## 2019-08-28 MED ORDER — LIDOCAINE HCL 1 % IJ SOLN
INTRAMUSCULAR | Status: AC
  Filled 2019-08-28: qty 20

## 2019-08-28 NOTE — Progress Notes (Signed)
   Called and spoke to her daughter, [name]. [name] is a Marine scientist on 33M.  She expressed concern about communication regarding her Moms care.    Discussed current plan and limited ECHO results.   We plan to diurese with IV lasix and follow her CVP and renal function closely.   [name] was appreciative of the update regarding heart failure.   Kanyla Omeara NP-C and Dr Haroldine Laws.  4:53 PM

## 2019-08-28 NOTE — Care Management Important Message (Signed)
Important Message  Patient Details  Name: Nicole [name] MRN: [id number] Date of Birth: August 26, 1935   Medicare Important Message Given:  Yes     [name] 08/28/2019, 2:43 PM

## 2019-08-28 NOTE — Care Management Important Message (Signed)
Important Message  Patient Details  Name: Nicole [name] MRN: [id number] Date of Birth: November 29, 1935   Medicare Important Message Given:  Yes     Tyreque Finken 08/28/2019, 2:44 PM

## 2019-08-28 NOTE — Progress Notes (Signed)
Advanced Heart Failure Rounding Note   Subjective:    Remains very SOB and weak. PICC line placed. CVP 18.  No co-ox drawn yet.    Objective:   Weight Range:  Vital Signs:   Temp:  [97.6 F (36.4 C)-98.3 F (36.8 C)] 97.9 F (36.6 C) (03/30 0806) Pulse Rate:  [96] 96 (03/29 2329) Resp:  [20] 20 (03/29 2329) BP: (106-116)/(58-80) 116/58 (03/30 1552) SpO2:  [95 %-99 %] 99 % (03/30 0753) Last BM Date: 08/26/19  Weight change: Filed Weights   08/25/19 0538 08/26/19 0400 08/27/19 0500  Weight: 85.9 kg 86.2 kg 89.1 kg    Intake/Output:   Intake/Output Summary (Last 24 hours) at 08/28/2019 1627 Last data filed at 08/28/2019 1552 Gross per 24 hour  Intake 990 ml  Output 1300 ml  Net -310 ml     Physical Exam: General:  Weak appearing. No resp difficulty HEENT: normal Neck: supple. JVP to ear with prominent CV waves . Carotids 2+ bilat; no bruits. No lymphadenopathy or thryomegaly appreciated. Cor: PMI nondisplaced. Irregular rate & rhythm. No rubs, gallops or murmurs. Lungs: decreased 1/2 up on R  No wheeze Abdomen: soft, nontender, nondistended. No hepatosplenomegaly. No bruits or masses. Good bowel sounds. Extremities: no cyanosis, clubbing, rash, 1+ edema Neuro: alert & orientedx3, cranial nerves grossly intact. moves all 4 extremities w/o difficulty. Affect pleasant  Telemetry: AF 90s Personally reviewed   Labs: Basic Metabolic Panel: Recent Labs  Lab 08/25/19 1555 08/25/19 1555 08/26/19 0218 08/27/19 0242 08/28/19 0545  NA 140  --  137 138 138  K 3.6  --  4.5 5.3* 4.9  CL 94*  --  97* 98 100  CO2 32  --  29 30 28   GLUCOSE 148*  --  187* 165* 128*  BUN 18  --  29* 52* 54*  CREATININE 1.07*  --  1.21* 1.65* 1.52*  CALCIUM 9.2   < > 9.0 9.0 9.0   < > = values in this interval not displayed.    Liver Function Tests: No results for input(s): AST, ALT, ALKPHOS, BILITOT, PROT, ALBUMIN in the last 168 hours. No results for input(s): LIPASE, AMYLASE in  the last 168 hours. No results for input(s): AMMONIA in the last 168 hours.  CBC: Recent Labs  Lab 08/25/19 1555 08/26/19 0218 08/28/19 0545  WBC 13.6* 17.1* 13.3*  NEUTROABS 12.0* 15.4*  --   HGB 8.5* 8.0* 7.2*  HCT 28.8* 27.3* 24.6*  MCV 85.5 87.2 87.5  PLT 1,633* 1,393* 1,516*    Cardiac Enzymes: No results for input(s): CKTOTAL, CKMB, CKMBINDEX, TROPONINI in the last 168 hours.  BNP: BNP (last 3 results) Recent Labs    07/30/19 0454 08/01/19 0528 08/28/19 0545  BNP 822.0* 737.0* 517.7*    ProBNP (last 3 results) No results for input(s): PROBNP in the last 8760 hours.    Other results:  Imaging: DG Chest 1 View  Result Date: 08/28/2019 CLINICAL DATA:  Status post thoracentesis. EXAM: CHEST  1 VIEW COMPARISON:  August 27, 2019 FINDINGS: The right-sided PICC line is well position. The right-sided pleural effusion has decreased since the prior study. There remains a small right-sided effusion with adjacent airspace disease favored to represent atelectasis. The heart size is enlarged. The patient is status post prior median sternotomy and atrial appendage clip placement. There is a probable small left-sided pleural effusion. IMPRESSION: 1. No pneumothorax status post right-sided thoracentesis. 2. Significant interval decrease in size of the right-sided pleural effusion. 3. Cardiomegaly  with persistent findings of vascular congestion. 4. Persistent small left-sided pleural effusion. Electronically Signed   By: Constance Holster M.D.   On: 08/28/2019 16:23   CT CHEST WO CONTRAST  Result Date: 08/27/2019 CLINICAL DATA:  Hypoxemia EXAM: CT CHEST WITHOUT CONTRAST TECHNIQUE: Multidetector CT imaging of the chest was performed following the standard protocol without IV contrast. COMPARISON:  CT chest angiogram, 06/24/2019 FINDINGS: Cardiovascular: Aortic atherosclerosis. Status post aortic and mitral valve repair. Cardiomegaly. Scattered coronary artery calcifications. No  pericardial effusion. Mediastinum/Nodes: No enlarged mediastinal, hilar, or axillary lymph nodes. Large left-sided multinodular goiter with significant substernal component. Trachea, and esophagus demonstrate no significant findings. Lungs/Pleura: Minimal centrilobular and paraseptal emphysema. Moderate right, small left pleural effusions with associated atelectasis or consolidation, effusions increased compared to prior CT dated 06/24/2019. No pleural effusion or pneumothorax. Upper Abdomen: No acute abnormality. Large, partially imaged ventral midline abdominal hernia containing stomach and transverse colon. Musculoskeletal: No chest wall mass or suspicious bone lesions identified. IMPRESSION: 1. Moderate right, small left pleural effusions with associated atelectasis or consolidation, increased compared to prior CT dated 06/24/2019. 2. Minimal emphysema.  Emphysema (ICD10-J43.9). 3. Cardiomegaly and scattered coronary artery calcifications. 4. Aortic Atherosclerosis (ICD10-I70.0). 5. Large left-sided multinodular thyroid goiter with significant substernal component. 6. Large, partially imaged ventral midline abdominal hernia containing stomach and transverse colon. Electronically Signed   By: Eddie Candle M.D.   On: 08/27/2019 12:41   DG CHEST PORT 1 VIEW  Result Date: 08/27/2019 CLINICAL DATA:  Respiratory failure EXAM: PORTABLE CHEST 1 VIEW COMPARISON:  08/25/2019 FINDINGS: Single frontal view of the chest demonstrates persistent enlargement the cardiac silhouette. Aortic valve prosthesis and postsurgical changes from median sternotomy again noted. Right-sided PICC tip overlies superior vena cava. Stable bibasilar consolidation and pleural effusions, right greater than left. No pneumothorax. No acute bony abnormalities. IMPRESSION: 1. Stable bilateral lower lobe consolidation and pleural effusions, right greater than left. 2. Stable cardiomegaly. Electronically Signed   By: Randa Ngo M.D.   On:  08/27/2019 20:57   ECHOCARDIOGRAM LIMITED  Result Date: 08/27/2019    ECHOCARDIOGRAM LIMITED REPORT   Patient Name:   Nicole [name] Date of Exam: 08/27/2019 Medical Rec #:  [id number]   Height:       63.0 in Accession #:    [id number]  Weight:       196.4 lb Date of Birth:  [birth year]   BSA:          1.919 m Patient Age:    84 years    BP:           92/58 mmHg Patient Gender: F           HR:           78 bpm. Exam Location:  Inpatient Procedure: Limited Echo Indications:    428.21 CHF  History:        Patient has prior history of Echocardiogram examinations, most                 recent 08/02/2019. CHF, COPD, Arrythmias:Atrial Fibrillation; Risk                 Factors:Dyslipidemia. Valvular heart disease.  Sonographer:    Jannett Celestine RDCS (AE) Referring Phys: [id number] [id number] SWAYZE  Sonographer Comments: No subcostal window. no subcostal due to hernia IMPRESSIONS  1. Left ventricular ejection fraction, by estimation, is 60 to 65%. The left ventricle has normal function. The left ventricle has no regional wall motion abnormalities.  2. Right  ventricule is not well visualized but systolic function appears moderately reduced and right ventricular size appears moderately enlarged.  3. The mitral valve has been repaired/replaced. Trivial mitral valve regurgitation. There is a prosthetic annuloplasty ring present in the mitral position. Mean gradient 3 mmHg.  4. The aortic valve has been repaired/replaced. Aortic valve regurgitation is not visualized. Echo findings are consistent with normal structure and function of the aortic valve prosthesis. Mean gradient 10 mmHg FINDINGS  Left Ventricle: Left ventricular ejection fraction, by estimation, is 60 to 65%. The left ventricle has normal function. The left ventricle has no regional wall motion abnormalities. Right Ventricle: The right ventricular size is moderately enlarged. Right ventricular systolic function is moderately reduced. Pericardium: Trivial pericardial effusion is  present. Presence of pericardial fat pad. Mitral Valve: The mitral valve has been repaired/replaced. Trivial mitral valve regurgitation. There is a prosthetic annuloplasty ring present in the mitral position. Tricuspid Valve: The tricuspid valve is normal in structure. Tricuspid valve regurgitation is trivial. Aortic Valve: The aortic valve has been repaired/replaced. Aortic valve regurgitation is not visualized. There is a 21 mm Edwards Magna-ease pericardial valve valve present in the aortic position. Echo findings are consistent with normal structure and function of the aortic valve prosthesis. Pulmonic Valve: The pulmonic valve was not well visualized. Pulmonic valve regurgitation is not visualized. Oswaldo Milian MD Electronically signed by Oswaldo Milian MD Signature Date/Time: 08/27/2019/11:08:11 PM    Final    [place] EKG SITE RITE  Result Date: 08/28/2019 If Site Rite image not attached, placement could not be confirmed due to current cardiac rhythm.  [place] EKG SITE RITE  Result Date: 08/27/2019 If Site Rite image not attached, placement could not be confirmed due to current cardiac rhythm.     Medications:     Scheduled Medications: . apixaban  2.5 mg Oral BID  . arformoterol  15 mcg Nebulization BID  . atorvastatin  40 mg Oral q1800  . budesonide  0.5 mg Nebulization BID  . busPIRone  10 mg Oral BID  . Chlorhexidine Gluconate Cloth  6 each Topical Daily  . diltiazem  180 mg Oral Daily  . DULoxetine  20 mg Oral Daily  . furosemide  80 mg Intravenous BID  . lidocaine      . metolazone  2.5 mg Oral Once  . metoprolol succinate  25 mg Oral Daily  . predniSONE  40 mg Oral Q breakfast  . sodium chloride flush  10-40 mL Intracatheter Q12H  . sodium chloride flush  3 mL Intravenous Q12H     Infusions: . sodium chloride    . azithromycin 500 mg (08/27/19 2135)     PRN Medications:  sodium chloride, acetaminophen, albuterol, ALPRAZolam, lidocaine (PF), lip balm,  ondansetron (ZOFRAN) IV, sodium chloride flush, sodium chloride flush   Assessment/Plan:    1. Acute on Chronic Diastolic CHF R>L:  - Echo LVEF 60-65%, mild basal septal hypertrophy and left ventricular hypertrophy, bi atrial enlargement RV moderately down  - Admitted for NYHA Class IIIb symptoms/ acute hypoxic respiratory failure. Evidence of volume overload on exam + bilateral pleural effusions on chest CT, R>L  - attempts at diuresis limited by borderline hypotension (SBP low 90s) and worsening renal function/AKI (SCr 1.07>>1.21>>1.65) - lasix increased to 80 IV BID. Minimal urine output.  - suspect dyspnea is multifactorial but CVP very high (18) - co-ox ordered but not drawn yet.  - will start empiric milrinone at 0.125  2. CML:  - followed by Dr. Heber Hanford. Recently  diagnosed - on treatment w/ imatinib (Gleevec) (tyrosine kinase inhibitor) - given potential cardiovascular side effects, may need to hold but will need to discuss w/ oncology   3. Acute Hypoxic Respiratory Failure  - Multifactorial 2/2 acute on chronic diastolic CHF, Pleural Effusion +  COPDE - abx+ steroids + nebs for COPD. Management per primary  - HF management as outlined above - Thoracentesis planned   4. Chronic Atrial Fibrillation - rate controlled. Continue Cardizem and metoprolol for now - continue Eliquis for a/c  5. Valvular Heart Disease:  -  Severe aortic stenosis and moderate MR, s/p AVR (tissue) and MV ring with Maze procedure 03/15/13.  -Stable valves on 3/21 echo.   Length of Stay: 3   Glori Bickers MD 08/28/2019, 4:27 PM  Advanced Heart Failure Team Pager [phone] (M-F; 7a - 4p)  Please contact [facility] for night-coverage after hours (4p -7a ) and weekends on [email or web address]

## 2019-08-28 NOTE — Progress Notes (Addendum)
PROGRESS NOTE  Nicole [name] [id number] DOB: 17-Jul-1935 DOA: 08/25/2019 PCP: Frances Maywood, FNP  Brief History    Nicole [name] is a 84 y.o. female with medical history significant for CML on Gleevec, COPD, chronic diastolic CHF, bioprosthetic AVR, and atrial fibrillation on Eliquis, now presenting to the emergency department for evaluation of shortness of breath.  Patient reports progressive dyspnea over the past 3 days associated with wheezing, orthopnea, and perhaps some mild lower extremity swelling.  Patient denies any fevers or chills, reports being treated for pneumonia 1 month ago but making a full recovery from that.  She denies any chest pain or palpitations.  She appeared to be in respiratory distress last night at her SNF, was given 40 mg torsemide and neb treatment, and transported to the ED.  ED Course: Upon arrival to the ED, patient is found to be afebrile, saturating mid 80s on room air, and acute respiratory distress, respiratory rate in the 30s, heart rate in the 150s, and stable blood pressure.  EKG features atrial fibrillation with rate 151.  BNP 700 range.  Procalcitonin 0.06.  CBC with chronic leukocytosis, anemia, and thrombocytosis.  Patient was placed on BiPAP, treated with Rocephin and azithromycin, given 5 mg IV bolus of diltiazem, albuterol neb, and seem to make fairly good improvement with this.  Transfer to [facility] was arranged for ongoing evaluation and management.  The patient has been admitted to a telemetry bed. Pt remains in atrial fibrillation with RVR, although rate on current EKG is 103. Use of rate limiting medications has been limited by the patient's blood pressure Diuretics have been continued, but have been limited by the patient's low blood pressures. EKG, BMP, Troponins, CXR, and echocardiogram have been ordered. Heart failure team has been consulted. PICC line was placed as recommended by heart failure. IR was consulted to perform thoracentesis  on the patient. 800 cc fluid were obtained. Fluid has been sent to the lab for cell count, chemistry and cultures.  Consultants  . Heart Failure Team  Procedures  . None  Antibiotics   Anti-infectives (From admission, onward)   Start     Dose/Rate Route Frequency Ordered Stop   08/25/19 1930  azithromycin (ZITHROMAX) 500 mg in sodium chloride 0.9 % 250 mL IVPB     500 mg 250 mL/hr over 60 Minutes Intravenous Every 24 hours 08/25/19 0649       Subjective  Pt continues to require BIPAP at night. No new complaints.  Objective   Vitals:  Vitals:   08/28/19 1524 08/28/19 1552  BP: (!) 116/58 (!) 116/58  Pulse:    Resp:    Temp:    SpO2:     Exam:  Constitutional:  . The patient is awake, alert, and oriented x 3. No acute distress. Respiratory:  . No increased work of breathing.  . No rales, or rhonchi . Small wheezes are present throughout lungs. . No tactile fremitus Cardiovascular:  . Irregular rate and rhythm. Rate is fast. . No murmurs, ectopy, or gallups. . No lateral PMI. No thrills. Abdomen:  . Abdomen is soft, non-tender, non-distended . No hernias, masses, or organomegaly . Normoactive bowel sounds.  Musculoskeletal:  . No cyanosis, clubbing, or edema Skin:  . No rashes, lesions, ulcers . palpation of skin: no induration or nodules Neurologic:  . CN 2-12 intact . Sensation all 4 extremities intact Psychiatric:  . Mental status o Mood, affect appropriate o Orientation to person, place, time  . judgment and  insight appear intact  I have personally reviewed the following:   Today's Data  . Vitals, BMP, and CBC  Imaging  . CXR: Pulmonary vascular congestion . Right lower lobe consolidate.  Cardiology Data  . EKG: Atrial Fibrillation with RVR 08/25/2019 . Echocardiogram: EF 60-65%. Normal LVEF. No regional wall motion abnormalities. RV systolic function appears moderately reduced. There is evidence of mitral and aortic  valvuloplasty. .  Scheduled Meds: . apixaban  2.5 mg Oral BID  . arformoterol  15 mcg Nebulization BID  . atorvastatin  40 mg Oral q1800  . budesonide  0.5 mg Nebulization BID  . busPIRone  10 mg Oral BID  . Chlorhexidine Gluconate Cloth  6 each Topical Daily  . diltiazem  180 mg Oral Daily  . DULoxetine  20 mg Oral Daily  . furosemide  80 mg Intravenous BID  . lidocaine      . metolazone  2.5 mg Oral Once  . metoprolol succinate  25 mg Oral Daily  . predniSONE  40 mg Oral Q breakfast  . sodium chloride flush  10-40 mL Intracatheter Q12H  . sodium chloride flush  3 mL Intravenous Q12H   Continuous Infusions: . sodium chloride    . azithromycin 500 mg (08/27/19 2135)    Principal Problem:   Acute respiratory failure with hypoxia (HCC) Active Problems:   CAD (coronary artery disease)   Atrial fibrillation with RVR (HCC)   Acute on chronic congestive heart failure (HCC)   COPD exacerbation (HCC)   CML (chronic myelocytic leukemia) ([place])   LOS: 3 days   A & P  Acute hypoxic respiratory failure: The patient continues to require intermittent support from BIPAP. Likely due to a combination of CHF, volume overload, COPD, right lower lobe consolidate, and atrial fibrillation. Diuresis and rate controlling medications are limited due to the patient's hypotension. Continue steroids and nebulizers.   COPD with acute exacerbation: Pt is receiving IV steroids, nebs, and antibiotics. CXR is pending. Still with small wheezes bilaterally.  Acute on chronic diastolic CHF: Pt with wheezes and rales at bases. Report orthopnea. BMP, CXR, and Echocardiogram are pending. Diuresis is limited by low blood pressures. BNP elevated. Heart failure team has been consulted. I appreciate their assistance.  Moderate right pleural effusion: Present on CT. Also small right pleural effusion. 800 cc of hazy fluid was taken off in thoracentesis performed by IR on 08/28/2019. Lab evaluation is pending. Finding  of atelectasis/consolidate on CT is related to this effusion.   Large left sided multinodular goiter with significant substernal component: Will need outpatient follow up.  Large midline abdominal hernia: CT Chest contains stomach and transverse colon. Noted.  Atrial fibrillation with RVR: Rate 86 on telemetry. Heart failure team has been consutled. She has been continued on Eliquis. Metoprolol and Diltiazem have been continued. Rate is controlled.  Hypotension: Improved. With leukocytosis, left shift. Concern for sepsis. CXR demonstrated right lower lobe consolidation, however, this is likely related to moderate pleural effusion on the right side.  CML:  CBC appears stable. Leukocytosis likely due to CML. Follows with oncology and recently started on [place].   Thrombocytosis: Chronic. Likely related to CML.   I have seen and examined this patient myself. I have spent 34 minutes in her evaluation and care.  DVT prophylaxis: Eliquis  Code Status: DNR, confirmed with patient  Family Communication: . Disposition Plan: From SNF. Anticipate disposition back to SNF. Barrier's to discharge: Clinical improvement in atrial fibrillation, CHF, and COPD with resolution of  acute respiratory failure, Dr. Tempie Hoist s/o.  Hoang Reich, DO [facility] Direct contact: see [email or web address]  7PM-7AM contact night coverage as above 08/28/2019, 4:12 PM  LOS: 0 days

## 2019-08-28 NOTE — Procedures (Signed)
PROCEDURE SUMMARY:  Successful image-guided right thoracentesis. Yielded 800 milliliters of hazy amber fluid. Patient tolerated procedure well. No immediate complications. EBL < 5 mL.  Specimen was sent for labs. CXR ordered.  Please see imaging section of Epic for full dictation.   Earley Abide PA-C 08/28/2019 3:57 PM

## 2019-08-28 NOTE — Progress Notes (Signed)
IR aware of request for thoracentesis.  COVID test ordered.  RN aware.   Brynda Greathouse, MS RD PA-C 7:15 AM

## 2019-08-29 ENCOUNTER — Ambulatory Visit (HOSPITAL_COMMUNITY): Payer: Medicare Other | Admitting: Hematology

## 2019-08-29 ENCOUNTER — Other Ambulatory Visit (HOSPITAL_COMMUNITY): Payer: Medicare Other

## 2019-08-29 DIAGNOSIS — I959 Hypotension, unspecified: Secondary | ICD-10-CM

## 2019-08-29 DIAGNOSIS — D649 Anemia, unspecified: Secondary | ICD-10-CM

## 2019-08-29 DIAGNOSIS — R5381 Other malaise: Secondary | ICD-10-CM

## 2019-08-29 DIAGNOSIS — Z7189 Other specified counseling: Secondary | ICD-10-CM

## 2019-08-29 DIAGNOSIS — I2510 Atherosclerotic heart disease of native coronary artery without angina pectoris: Secondary | ICD-10-CM

## 2019-08-29 DIAGNOSIS — D473 Essential (hemorrhagic) thrombocythemia: Secondary | ICD-10-CM

## 2019-08-29 LAB — PH, BODY FLUID: pH, Body Fluid: 7.7

## 2019-08-29 LAB — IRON AND TIBC
Iron: 7 ug/dL — ABNORMAL LOW (ref 28–170)
Saturation Ratios: 1 % — ABNORMAL LOW (ref 10.4–31.8)
TIBC: 479 ug/dL — ABNORMAL HIGH (ref 250–450)
UIBC: 472 ug/dL

## 2019-08-29 LAB — BASIC METABOLIC PANEL
Anion gap: 8 (ref 5–15)
BUN: 47 mg/dL — ABNORMAL HIGH (ref 8–23)
CO2: 29 mmol/L (ref 22–32)
Calcium: 8.5 mg/dL — ABNORMAL LOW (ref 8.9–10.3)
Chloride: 99 mmol/L (ref 98–111)
Creatinine, Ser: 1.28 mg/dL — ABNORMAL HIGH (ref 0.44–1.00)
GFR calc Af Amer: 45 mL/min — ABNORMAL LOW (ref >60)
GFR calc non Af Amer: 39 mL/min — ABNORMAL LOW (ref >60)
Glucose, Bld: 103 mg/dL — ABNORMAL HIGH (ref 70–99)
Potassium: 4.1 mmol/L (ref 3.5–5.1)
Sodium: 136 mmol/L (ref 135–145)

## 2019-08-29 LAB — COOXEMETRY PANEL
Carboxyhemoglobin: 1.9 % — ABNORMAL HIGH (ref 0.5–1.5)
Methemoglobin: 1 % (ref 0.0–1.5)
O2 Saturation: 72.1 %
Total hemoglobin: 7.2 g/dL — ABNORMAL LOW (ref 12.0–16.0)

## 2019-08-29 LAB — GLUCOSE, PLEURAL OR PERITONEAL FLUID: Glucose, Fluid: 136 mg/dL

## 2019-08-29 LAB — CYTOLOGY - NON PAP

## 2019-08-29 LAB — RETICULOCYTES
Immature Retic Fract: 29 % — ABNORMAL HIGH (ref 2.3–15.9)
RBC.: 2.93 MIL/uL — ABNORMAL LOW (ref 3.87–5.11)
Retic Count, Absolute: 100.2 10*3/uL (ref 19.0–186.0)
Retic Ct Pct: 3.4 % — ABNORMAL HIGH (ref 0.4–3.1)

## 2019-08-29 LAB — FERRITIN: Ferritin: 20 ng/mL (ref 11–307)

## 2019-08-29 LAB — MAGNESIUM: Magnesium: 2.7 mg/dL — ABNORMAL HIGH (ref 1.7–2.4)

## 2019-08-29 LAB — FOLATE: Folate: 15.5 ng/mL (ref >5.9)

## 2019-08-29 LAB — VITAMIN B12: Vitamin B-12: 407 pg/mL (ref 180–914)

## 2019-08-29 MED ORDER — METOLAZONE 2.5 MG PO TABS
2.5000 mg | ORAL_TABLET | Freq: Once | ORAL | Status: AC
  Administered 2019-08-29: 2.5 mg via ORAL
  Filled 2019-08-29: qty 1

## 2019-08-29 MED ORDER — APIXABAN 5 MG PO TABS
5.0000 mg | ORAL_TABLET | Freq: Two times a day (BID) | ORAL | Status: DC
  Administered 2019-08-29 – 2019-09-01 (×5): 5 mg via ORAL
  Filled 2019-08-29 (×5): qty 1

## 2019-08-29 MED ORDER — POTASSIUM CHLORIDE CRYS ER 20 MEQ PO TBCR
20.0000 meq | EXTENDED_RELEASE_TABLET | Freq: Once | ORAL | Status: AC
  Administered 2019-08-29: 20 meq via ORAL
  Filled 2019-08-29: qty 1

## 2019-08-29 NOTE — TOC Progression Note (Signed)
Transition of Care [facility]) - Progression Note    Patient Details  Name: Nicole [name] MRN: [id number] Date of Birth: 1936/01/26  Transition of Care [facility]) CM/SW [place], RN Phone Number: 08/29/2019, 2:34 PM  Clinical Narrative:    Patient states that she will discharge to [facility] once medically cleared. CM has spoken with Rep from [facility] who verifies that patient is ok to return to facility when she is medically cleared. Will continue to follow.          Expected Discharge Plan and Services                                                 Social Determinants of Health (SDOH) Interventions    Readmission Risk Interventions Readmission Risk Prevention Plan 08/27/2019  Transportation Screening Complete  Medication Review (RN Care Manager) Referral to Pharmacy  PCP or Specialist appointment within 3-5 days of discharge Complete  Skilled Nursing Facility Complete  Some recent data might be hidden

## 2019-08-29 NOTE — Progress Notes (Addendum)
Advanced Heart Failure Rounding Note   Subjective:    Yesterday diuresed with IV lasix. Brisk diuresis noted. CVP down to 13.   Feeling much better but has ongoing fatigue.     Objective:   Weight Range:  Vital Signs:   Temp:  [97.9 F (36.6 C)-98.1 F (36.7 C)] 97.9 F (36.6 C) (03/31 0806) Pulse Rate:  [88-92] 88 (03/31 0900) Resp:  [15-19] 19 (03/31 0900) BP: (93-116)/(57-65) 104/61 (03/31 0900) SpO2:  [95 %-100 %] 97 % (03/31 0900) Last BM Date: 08/28/19  Weight change: Filed Weights   08/25/19 0538 08/26/19 0400 08/27/19 0500  Weight: 85.9 kg 86.2 kg 89.1 kg    Intake/Output:   Intake/Output Summary (Last 24 hours) at 08/29/2019 1000 Last data filed at 08/29/2019 0300 Gross per 24 hour  Intake 660 ml  Output 2000 ml  Net -1340 ml     Physical Exam: General:  Appears weak. No resp difficulty HEENT: normal Neck: supple. JVP 11-12 . Carotids 2+ bilat; no bruits. No lymphadenopathy or thryomegaly appreciated. Cor: PMI nondisplaced. Irregular rate & rhythm. No rubs, gallops or murmurs. Lungs: decreased in the bases.  Abdomen: soft, nontender, nondistended. No hepatosplenomegaly. No bruits or masses. Good bowel sounds. Extremities: no cyanosis, clubbing, rash, edema Neuro: alert & orientedx3, cranial nerves grossly intact. moves all 4 extremities w/o difficulty. Affect pleasant  Telemetry: A fib 90s   Labs: Basic Metabolic Panel: Recent Labs  Lab 08/25/19 1555 08/25/19 1555 08/26/19 0218 08/26/19 0218 08/27/19 0242 08/28/19 0545 08/29/19 0649  NA 140  --  137  --  138 138 136  K 3.6  --  4.5  --  5.3* 4.9 4.1  CL 94*  --  97*  --  98 100 99  CO2 32  --  29  --  30 28 29   GLUCOSE 148*  --  187*  --  165* 128* 103*  BUN 18  --  29*  --  52* 54* 47*  CREATININE 1.07*  --  1.21*  --  1.65* 1.52* 1.28*  CALCIUM 9.2   < > 9.0   < > 9.0 9.0 8.5*  MG  --   --   --   --   --   --  2.7*   < > = values in this interval not displayed.    Liver  Function Tests: Recent Labs  Lab 08/28/19 2000  PROT 5.4*  ALBUMIN 3.1*   No results for input(s): LIPASE, AMYLASE in the last 168 hours. No results for input(s): AMMONIA in the last 168 hours.  CBC: Recent Labs  Lab 08/25/19 1555 08/26/19 0218 08/28/19 0545 08/28/19 2000  WBC 13.6* 17.1* 13.3* 14.5*  NEUTROABS 12.0* 15.4*  --   --   HGB 8.5* 8.0* 7.2* 7.7*  HCT 28.8* 27.3* 24.6* 26.0*  MCV 85.5 87.2 87.5 86.1  PLT 1,633* 1,393* 1,516* 1,594*    Cardiac Enzymes: No results for input(s): CKTOTAL, CKMB, CKMBINDEX, TROPONINI in the last 168 hours.  BNP: BNP (last 3 results) Recent Labs    07/30/19 0454 08/01/19 0528 08/28/19 0545  BNP 822.0* 737.0* 517.7*    ProBNP (last 3 results) No results for input(s): PROBNP in the last 8760 hours.    Other results:  Imaging: DG Chest 1 View  Result Date: 08/28/2019 CLINICAL DATA:  Status post thoracentesis. EXAM: CHEST  1 VIEW COMPARISON:  August 27, 2019 FINDINGS: The right-sided PICC line is well position. The right-sided pleural effusion has decreased since  the prior study. There remains a small right-sided effusion with adjacent airspace disease favored to represent atelectasis. The heart size is enlarged. The patient is status post prior median sternotomy and atrial appendage clip placement. There is a probable small left-sided pleural effusion. IMPRESSION: 1. No pneumothorax status post right-sided thoracentesis. 2. Significant interval decrease in size of the right-sided pleural effusion. 3. Cardiomegaly with persistent findings of vascular congestion. 4. Persistent small left-sided pleural effusion. Electronically Signed   By: Constance Holster M.D.   On: 08/28/2019 16:23   CT CHEST WO CONTRAST  Result Date: 08/27/2019 CLINICAL DATA:  Hypoxemia EXAM: CT CHEST WITHOUT CONTRAST TECHNIQUE: Multidetector CT imaging of the chest was performed following the standard protocol without IV contrast. COMPARISON:  CT chest  angiogram, 06/24/2019 FINDINGS: Cardiovascular: Aortic atherosclerosis. Status post aortic and mitral valve repair. Cardiomegaly. Scattered coronary artery calcifications. No pericardial effusion. Mediastinum/Nodes: No enlarged mediastinal, hilar, or axillary lymph nodes. Large left-sided multinodular goiter with significant substernal component. Trachea, and esophagus demonstrate no significant findings. Lungs/Pleura: Minimal centrilobular and paraseptal emphysema. Moderate right, small left pleural effusions with associated atelectasis or consolidation, effusions increased compared to prior CT dated 06/24/2019. No pleural effusion or pneumothorax. Upper Abdomen: No acute abnormality. Large, partially imaged ventral midline abdominal hernia containing stomach and transverse colon. Musculoskeletal: No chest wall mass or suspicious bone lesions identified. IMPRESSION: 1. Moderate right, small left pleural effusions with associated atelectasis or consolidation, increased compared to prior CT dated 06/24/2019. 2. Minimal emphysema.  Emphysema (ICD10-J43.9). 3. Cardiomegaly and scattered coronary artery calcifications. 4. Aortic Atherosclerosis (ICD10-I70.0). 5. Large left-sided multinodular thyroid goiter with significant substernal component. 6. Large, partially imaged ventral midline abdominal hernia containing stomach and transverse colon. Electronically Signed   By: Eddie Candle M.D.   On: 08/27/2019 12:41   DG CHEST PORT 1 VIEW  Result Date: 08/27/2019 CLINICAL DATA:  Respiratory failure EXAM: PORTABLE CHEST 1 VIEW COMPARISON:  08/25/2019 FINDINGS: Single frontal view of the chest demonstrates persistent enlargement the cardiac silhouette. Aortic valve prosthesis and postsurgical changes from median sternotomy again noted. Right-sided PICC tip overlies superior vena cava. Stable bibasilar consolidation and pleural effusions, right greater than left. No pneumothorax. No acute bony abnormalities. IMPRESSION: 1.  Stable bilateral lower lobe consolidation and pleural effusions, right greater than left. 2. Stable cardiomegaly. Electronically Signed   By: Randa Ngo M.D.   On: 08/27/2019 20:57   ECHOCARDIOGRAM LIMITED  Result Date: 08/27/2019    ECHOCARDIOGRAM LIMITED REPORT   Patient Name:   Nicole [name] Date of Exam: 08/27/2019 Medical Rec #:  [id number]   Height:       63.0 in Accession #:    [id number]  Weight:       196.4 lb Date of Birth:  [birth year]   BSA:          1.919 m Patient Age:    10 years    BP:           92/58 mmHg Patient Gender: F           HR:           78 bpm. Exam Location:  Inpatient Procedure: Limited Echo Indications:    428.21 CHF  History:        Patient has prior history of Echocardiogram examinations, most                 recent 08/02/2019. CHF, COPD, Arrythmias:Atrial Fibrillation; Risk  Factors:Dyslipidemia. Valvular heart disease.  Sonographer:    Jannett Celestine RDCS (AE) Referring Phys: [id number] AVA SWAYZE  Sonographer Comments: No subcostal window. no subcostal due to hernia IMPRESSIONS  1. Left ventricular ejection fraction, by estimation, is 60 to 65%. The left ventricle has normal function. The left ventricle has no regional wall motion abnormalities.  2. Right ventricule is not well visualized but systolic function appears moderately reduced and right ventricular size appears moderately enlarged.  3. The mitral valve has been repaired/replaced. Trivial mitral valve regurgitation. There is a prosthetic annuloplasty ring present in the mitral position. Mean gradient 3 mmHg.  4. The aortic valve has been repaired/replaced. Aortic valve regurgitation is not visualized. Echo findings are consistent with normal structure and function of the aortic valve prosthesis. Mean gradient 10 mmHg FINDINGS  Left Ventricle: Left ventricular ejection fraction, by estimation, is 60 to 65%. The left ventricle has normal function. The left ventricle has no regional wall motion abnormalities. Right  Ventricle: The right ventricular size is moderately enlarged. Right ventricular systolic function is moderately reduced. Pericardium: Trivial pericardial effusion is present. Presence of pericardial fat pad. Mitral Valve: The mitral valve has been repaired/replaced. Trivial mitral valve regurgitation. There is a prosthetic annuloplasty ring present in the mitral position. Tricuspid Valve: The tricuspid valve is normal in structure. Tricuspid valve regurgitation is trivial. Aortic Valve: The aortic valve has been repaired/replaced. Aortic valve regurgitation is not visualized. There is a 21 mm Edwards Magna-ease pericardial valve valve present in the aortic position. Echo findings are consistent with normal structure and function of the aortic valve prosthesis. Pulmonic Valve: The pulmonic valve was not well visualized. Pulmonic valve regurgitation is not visualized. Oswaldo Milian MD Electronically signed by Oswaldo Milian MD Signature Date/Time: 08/27/2019/11:08:11 PM    Final    [place] EKG SITE RITE  Result Date: 08/28/2019 If Site Rite image not attached, placement could not be confirmed due to current cardiac rhythm.  [place] EKG SITE RITE  Result Date: 08/27/2019 If Site Rite image not attached, placement could not be confirmed due to current cardiac rhythm.  IR THORACENTESIS ASP PLEURAL SPACE W/IMG GUIDE  Result Date: 08/28/2019 INDICATION: Patient with history of heart failure, COPD, dyspnea, and bilateral pleural effusions, right > left. Request is made for diagnostic and therapeutic right thoracentesis. EXAM: ULTRASOUND GUIDED DIAGNOSTIC AND THERAPEUTIC RIGHT THORACENTESIS MEDICATIONS: 10 mL 1% lidocaine COMPLICATIONS: None immediate. PROCEDURE: An ultrasound guided thoracentesis was thoroughly discussed with the patient and questions answered. The benefits, risks, alternatives and complications were also discussed. The patient understands and wishes to proceed with the procedure. Written  consent was obtained. Ultrasound was performed to localize and mark an adequate pocket of fluid in the right chest. The area was then prepped and draped in the normal sterile fashion. 1% Lidocaine was used for local anesthesia. Under ultrasound guidance a 6 Fr Safe-T-Centesis catheter was introduced. Thoracentesis was performed. The catheter was removed and a dressing applied. FINDINGS: A total of approximately 800 mL of hazy amber fluid was removed. Samples were sent to the laboratory as requested by the clinical team. IMPRESSION: Successful ultrasound guided right thoracentesis yielding 800 mL of pleural fluid. Read by: Earley Abide, PA-C Electronically Signed   By: Jacqulynn Cadet M.D.   On: 08/28/2019 16:26     Medications:     Scheduled Medications: . apixaban  2.5 mg Oral BID  . arformoterol  15 mcg Nebulization BID  . atorvastatin  40 mg Oral q1800  . budesonide  0.5  mg Nebulization BID  . busPIRone  10 mg Oral BID  . Chlorhexidine Gluconate Cloth  6 each Topical Daily  . diltiazem  180 mg Oral Daily  . DULoxetine  20 mg Oral Daily  . furosemide  80 mg Intravenous BID  . metolazone  2.5 mg Oral Once  . metoprolol succinate  25 mg Oral Daily  . predniSONE  40 mg Oral Q breakfast  . sodium chloride flush  10-40 mL Intracatheter Q12H  . sodium chloride flush  3 mL Intravenous Q12H    Infusions: . sodium chloride    . azithromycin Stopped (08/29/19 0700)    PRN Medications: sodium chloride, acetaminophen, albuterol, ALPRAZolam, lidocaine (PF), lip balm, ondansetron (ZOFRAN) IV, sodium chloride flush, sodium chloride flush   Assessment/Plan:    1. Acute on Chronic Diastolic CHF R>L:  - Echo LVEF 60-65%, mild basal septal hypertrophy and left ventricular hypertrophy, bi atrial enlargement RV moderately down  - Admitted for NYHA Class IIIb symptoms/ acute hypoxic respiratory failure. Evidence of volume overload on exam + bilateral pleural effusions on chest CT, R>L  -  attempts at diuresis limited by borderline hypotension and AKI - Renal function stable today.  - CVP 13. Continue lasix 80 mg twice a day and give once dose of metolazone. (Prior to admit she was taking torsemide 60 mg daily)  - CO-OX 72%. No need for mirinone.    2. CML:  - followed by Dr. Heber Yaphank. Recently diagnosed - on treatment w/ imatinib (Gleevec) (tyrosine kinase inhibitor) - given potential cardiovascular side effects, may need to hold but will need to discuss w/ oncology   3. Acute Hypoxic Respiratory Failure  - Multifactorial 2/2 acute on chronic diastolic CHF, Pleural Effusion +  COPDE - abx+ steroids + nebs for COPD. Management per primary  - HF management as outlined above - Thoracentesis with 800 cc removed.    4. Chronic Atrial Fibrillation - Rate controlled.   Continue Cardizem and metoprolol for now - continue Eliquis for a/c  5. Valvular Heart Disease:  -  Severe aortic stenosis and moderate MR, s/p AVR (tissue) and MV ring with Maze procedure 03/15/13.  -Stable valves on 3/21 echo.  Consult PT.   Length of Stay: 4   Amy Clegg NP-C  08/29/2019, 10:00 AM  Advanced Heart Failure Team Pager [phone] (M-F; [place])  Please contact [facility] for night-coverage after hours (4p -7a ) and weekends on [email or web address]  Patient seen and examined with the above-signed Advanced Practice Provider and/or Housestaff. I personally reviewed laboratory data, imaging studies and relevant notes. I independently examined the patient and formulated the important aspects of the plan. I have edited the note to reflect any of my changes or salient points. I have personally discussed the plan with the patient and/or family.  Underwent right thoracentesis yesterday with 800 cc off. Fluid is transudative thankfully. Now diuresing with IV lasix. CVP coming down. Co-ox 72% CVP 13. Creatinine improving. Iron stores low  General:  Weak appearing. No resp difficulty HEENT:  normal Neck: supple. JVP 10-12. Carotids 2+ bilat; no bruits. No lymphadenopathy or thryomegaly appreciated. Cor: PMI nondisplaced. Irregular rate & rhythm. 2/6 TR Lungs: clear Abdomen: soft, nontender, nondistended. No hepatosplenomegaly. No bruits or masses. Good bowel sounds. Extremities: no cyanosis, clubbing, rash, 1+ edema Neuro: alert & orientedx3, cranial nerves grossly intact. moves all 4 extremities w/o difficulty. Affect pleasant  Dyspnea improved with thoracentesis. Remains volume overloaded but improving. Co-ox ok. Continue IV diuresis. Add metolazone  and ted hose. Follow CVP, co-ox and renal function.  Glori Bickers, MD  3:03 PM

## 2019-08-29 NOTE — Progress Notes (Signed)
Bipap on standby.  HR 82, Sat 97% and 14 RR. 2L in use.

## 2019-08-29 NOTE — Care Management Important Message (Signed)
Important Message  Patient Details  Name: Nicole [name] MRN: [id number] Date of Birth: May 23, 1936   Medicare Important Message Given:  Yes     Makhari Dovidio 08/29/2019, 9:55 AM

## 2019-08-29 NOTE — Evaluation (Signed)
Physical Therapy Evaluation Patient Details Name: Nicole [name] MRN: [id number] DOB: Sep 24, 1935 Today's Date: 08/29/2019   History of Present Illness  84 year old female with history of CML on Gleevec, COPD, diastolic CHF, bioprosthetic AVR, and A. fib on Eliquis presenting with shortness of breath, wheeze, orthopnea and LE edema. Pt admitted for diastolic CHF exacerbation, Afib with RVR, and R pleural effusion. Pt underwent thoracentesis on 3/30.  Clinical Impression  Pt presents to PT with deficits in functional mobility, gait, balance, endurance, power, and strength. Pt limited by fatigue during evaluation, declining any out of bed activity at this time due to general fatigue. Pt currently requires assistance to return to bed and demonstrates some increased work of breathing with limited activity. Pt will benefit from aggressive mobilization to improve activity tolerance and reduce falls risk.    Follow Up Recommendations SNF;Supervision/Assistance - 24 hour    Equipment Recommendations  (defer to post-acute setting)    Recommendations for Other Services       Precautions / Restrictions Precautions Precautions: Fall Precaution Comments: SpO2 Restrictions Weight Bearing Restrictions: No      Mobility  Bed Mobility Overal bed mobility: Needs Assistance Bed Mobility: Supine to Sit;Sit to Supine     Supine to sit: Supervision;HOB elevated Sit to supine: Mod assist      Transfers Overall transfer level: (pt refuses standing or OOB activity 2/2 fatigue)                  Ambulation/Gait                Stairs            Wheelchair Mobility    Modified Rankin (Stroke Patients Only)       Balance Overall balance assessment: Needs assistance Sitting-balance support: Single extremity supported;Feet supported Sitting balance-Leahy Scale: Good Sitting balance - Comments: supervision with unilateral UE support of bed   Standing balance support: (pt  declines standing activity)                                 Pertinent Vitals/Pain Pain Assessment: No/denies pain    Home Living Family/patient expects to be discharged to:: Skilled nursing facility Living Arrangements: Alone Available Help at Discharge: Friend(s);Available PRN/intermittently Type of Home: Apartment Home Access: Level entry     Home Layout: One level Home Equipment: Walker - 2 wheels;Cane - single point;Tub bench      Prior Function Level of Independence: Independent with assistive device(s)         Comments: household ambulator usually leaning on furniture or using SPC, uses RW for longer distances, reports independence with ADLs     Hand Dominance   Dominant Hand: Right    Extremity/Trunk Assessment   Upper Extremity Assessment Upper Extremity Assessment: Generalized weakness    Lower Extremity Assessment Lower Extremity Assessment: Generalized weakness    Cervical / Trunk Assessment Cervical / Trunk Assessment: Normal  Communication   Communication: No difficulties  Cognition Arousal/Alertness: Awake/alert Behavior During Therapy: WFL for tasks assessed/performed Overall Cognitive Status: Within Functional Limits for tasks assessed                                        General Comments General comments (skin integrity, edema, etc.): pt on 1L [place] upon PT arrival, PT attempts to wean pt to room  air with pt desaturating to 86% during activity. PT returns pt to 1L [place] with improvement in sats to 92%.    Exercises     Assessment/Plan    PT Assessment Patient needs continued PT services  PT Problem List Decreased strength;Decreased activity tolerance;Decreased balance;Decreased mobility;Decreased safety awareness;Decreased knowledge of precautions;Cardiopulmonary status limiting activity       PT Treatment Interventions DME instruction;Gait training;Functional mobility training;Therapeutic  activities;Therapeutic exercise;Balance training;Neuromuscular re-education;Patient/family education    PT Goals (Current goals can be found in the Care Plan section)  Acute Rehab PT Goals Patient Stated Goal: To go to rehab and regain independence PT Goal Formulation: With patient Time For Goal Achievement: 09/12/19 Potential to Achieve Goals: Good Additional Goals Additional Goal #1: Pt will maintain dynamic standing balance within 10 inches of her base of support with supervision and unilateral UE support of the least restrictive assistive device.    Frequency Min 2X/week   Barriers to discharge        Co-evaluation               AM-PAC PT "6 Clicks" Mobility  Outcome Measure Help needed turning from your back to your side while in a flat bed without using bedrails?: A Little Help needed moving from lying on your back to sitting on the side of a flat bed without using bedrails?: A Little Help needed moving to and from a bed to a chair (including a wheelchair)?: A Lot Help needed standing up from a chair using your arms (e.g., wheelchair or bedside chair)?: A Lot Help needed to walk in hospital room?: A Lot Help needed climbing 3-5 steps with a railing? : Total 6 Click Score: 13    End of Session Equipment Utilized During Treatment: Oxygen Activity Tolerance: Patient limited by fatigue Patient left: in bed;with call bell/phone within reach;with bed alarm set Nurse Communication: Mobility status PT Visit Diagnosis: Muscle weakness (generalized) (M62.81)    Time: [id number] PT Time Calculation (min) (ACUTE ONLY): 13 min   Charges:   PT Evaluation $PT Eval Moderate Complexity: 1 Mod          Nicole [name], PT, DPT Acute Rehabilitation Pager: [phone]   ISLA BAILS 08/29/2019, 5:29 PM

## 2019-08-29 NOTE — Progress Notes (Signed)
PROGRESS NOTE  Nicole [name] [id number] DOB: [birth year]   PCP: Frances Maywood, FNP  Patient is from: SNF  DOA: 08/25/2019 LOS: 4  Brief Narrative / Interim history: 84 year old female with history of CML on Gleevec, COPD, diastolic CHF, bioprosthetic AVR, and A. fib on Eliquis presenting with shortness of breath, wheeze, orthopnea and LE edema.  In ED, saturating in the mid 80s on RA.  In A. fib with RVR.  BNP in 700s.  Pro-Cal negative.  CBC with chronic leukocytosis, anemia and thrombocytosis.  Started on BiPAP, Rocephin, albuterol, IV Cardizem and breathing treatments, and admitted for diastolic CHF exacerbation, A. fib with RVR and right-sided pleural effusion.  Advanced HF team consulted, and started on high-dose diuretics.  IR performed thoracocentesis with removal of 800 cc transudative fluid by protein.  Pleural fluid and blood cultures negative so far.  Subjective: Seen and examined earlier this morning.  No major events overnight or this morning.  She reports some improvement in her breathing.  She denies chest pain or palpitation.  Denies GI or UTI symptoms.  Denies melena or hematochezia.  Objective: Vitals:   08/29/19 0806 08/29/19 0859 08/29/19 0900 08/29/19 1157  BP: (!) 103/57 104/61 104/61 (!) 102/53  Pulse: 88  88 89  Resp: 18  19 20   Temp: 97.9 F (36.6 C)   97.9 F (36.6 C)  TempSrc: Oral   Oral  SpO2: 100%  97% 94%  Weight:        Intake/Output Summary (Last 24 hours) at 08/29/2019 1323 Last data filed at 08/29/2019 0300 Gross per 24 hour  Intake 660 ml  Output 2000 ml  Net -1340 ml   Filed Weights   08/25/19 0538 08/26/19 0400 08/27/19 0500  Weight: 85.9 kg 86.2 kg 89.1 kg    Examination:  GENERAL: No acute distress.  Appears well.  HEENT: MMM.  Vision and hearing grossly intact.  NECK: Supple.  No apparent JVD.  RESP: 99% on 2 L by Impact.  No IWOB.  Fair aeration but limited exam due to body habitus. CVS:  RRR. Heart sounds normal.  ABD/GI/GU:  Bowel sounds present. Soft.  Some tenderness due to abdominal hernia. MSK/EXT:  Moves extremities. No apparent deformity.  Trace edema bilaterally. SKIN: no apparent skin lesion or wound NEURO: Awake, alert and oriented appropriately.  No apparent focal neuro deficit. PSYCH: Calm. Normal affect.  Procedures:  3/30-thoracocentesis with removal of 800 cc transudative fluid  Assessment & Plan: Acute respiratory failure with hypoxia-multifactorial including CHF, COPD and pleural effusion. -Wean oxygen as able-minimal oxygen to keep saturation above 90%.  Not on oxygen at home. -Treat treatable causes.  Acute on chronic diastolic CHF, right> left: Limited Echo with EF of 60 to 65%, no RWMA, biatrial enlargement, moderately reduced RVSF.  Hypotension and AKI limiting diuresis.  Currently on IV Lasix.  She has had about 1.2 L UOP overnight.  Creatinine improved.  -Advanced HF team managing-on IV Lasix 80 mg twice daily plus metolazone. -GDMT-metoprolol XL 25 mg daily -Monitor fluid status, renal function and electrolytes.  COPD exacerbation: Improving. -Completed steroid and antibiotic course -Continue breathing treatments -Wean oxygen as able as above.  Moderate right-sided pleural effusion: s/p thoracocentesis with removal of 800 cc culture negative transudate of fluid by protein -Diuretics as above.  A. fib with RVR: RVR resolved. -Continue metoprolol XL, Cardizem CD and Eliquis.  History of bioprosthetic AVR/MV repair: Stable  Large left-sided multinodular goiter with significant substernal component -Outpatient follow-up.  CML/thrombocytosis -Continue Gleevec  Normocytic anemia: Baseline Hgb 8-9> 8.5 (admit)>>> 7.7.  She denies melena or hematochezia.  Could be due to CML. -Check anemia panel -We will transfuse 1 unit if further drop  Midline abdominal hernia: No incarceration. -Supportive care  Hypotension: Improved. -Per cardiology.  Debility/generalized  weakness -PT/OT  DNR/DNI: Appropriate.                     DVT prophylaxis: On Eliquis Code Status: DNR/DNI Family Communication: Patient and/or RN. Available if any question.   Discharge barrier: Fluid overload/acute diastolic CHF complicated by hypotension and AKI.  On high-dose IV diuretics. Patient is from: SNF Final disposition: Likely SNF once adequately diuresed and cleared by advanced HF team.  Consultants: Advanced HF team   Microbiology summarized: 3/30-COVID-19 negative. 3/29-blood cultures negative. 3/30-pleural fluid cultures negative.  Sch Meds:  Scheduled Meds: . apixaban  5 mg Oral BID  . arformoterol  15 mcg Nebulization BID  . atorvastatin  40 mg Oral q1800  . budesonide  0.5 mg Nebulization BID  . busPIRone  10 mg Oral BID  . Chlorhexidine Gluconate Cloth  6 each Topical Daily  . diltiazem  180 mg Oral Daily  . DULoxetine  20 mg Oral Daily  . furosemide  80 mg Intravenous BID  . metoprolol succinate  25 mg Oral Daily  . predniSONE  40 mg Oral Q breakfast  . sodium chloride flush  10-40 mL Intracatheter Q12H  . sodium chloride flush  3 mL Intravenous Q12H   Continuous Infusions: . sodium chloride    . azithromycin Stopped (08/29/19 0700)   PRN Meds:.sodium chloride, acetaminophen, albuterol, ALPRAZolam, lidocaine (PF), lip balm, ondansetron (ZOFRAN) IV, sodium chloride flush, sodium chloride flush  Antimicrobials: Anti-infectives (From admission, onward)   Start     Dose/Rate Route Frequency Ordered Stop   08/25/19 1930  azithromycin (ZITHROMAX) 500 mg in sodium chloride 0.9 % 250 mL IVPB     500 mg 250 mL/hr over 60 Minutes Intravenous Every 24 hours 08/25/19 0649 08/29/19 2359       I have personally reviewed the following labs and images: CBC: Recent Labs  Lab 08/25/19 1555 08/26/19 0218 08/28/19 0545 08/28/19 2000  WBC 13.6* 17.1* 13.3* 14.5*  NEUTROABS 12.0* 15.4*  --   --   HGB 8.5* 8.0* 7.2* 7.7*  HCT 28.8* 27.3*  24.6* 26.0*  MCV 85.5 87.2 87.5 86.1  PLT 1,633* 1,393* 1,516* 1,594*   BMP &GFR Recent Labs  Lab 08/25/19 1555 08/26/19 0218 08/27/19 0242 08/28/19 0545 08/29/19 0649  NA 140 137 138 138 136  K 3.6 4.5 5.3* 4.9 4.1  CL 94* 97* 98 100 99  CO2 32 29 30 28 29   GLUCOSE 148* 187* 165* 128* 103*  BUN 18 29* 52* 54* 47*  CREATININE 1.07* 1.21* 1.65* 1.52* 1.28*  CALCIUM 9.2 9.0 9.0 9.0 8.5*  MG  --   --   --   --  2.7*   Estimated Creatinine Clearance: 35.9 mL/min (A) (by C-G formula based on SCr of 1.28 mg/dL (H)). Liver & Pancreas: Recent Labs  Lab 08/28/19 2000  PROT 5.4*  ALBUMIN 3.1*   No results for input(s): LIPASE, AMYLASE in the last 168 hours. No results for input(s): AMMONIA in the last 168 hours. Diabetic: No results for input(s): HGBA1C in the last 72 hours. No results for input(s): GLUCAP in the last 168 hours. Cardiac Enzymes: No results for input(s): CKTOTAL, CKMB, CKMBINDEX, TROPONINI in the last 168 hours. No results  for input(s): PROBNP in the last 8760 hours. Coagulation Profile: No results for input(s): INR, PROTIME in the last 168 hours. Thyroid Function Tests: No results for input(s): TSH, T4TOTAL, FREET4, T3FREE, THYROIDAB in the last 72 hours. Lipid Profile: No results for input(s): CHOL, HDL, LDLCALC, TRIG, CHOLHDL, LDLDIRECT in the last 72 hours. Anemia Panel: No results for input(s): VITAMINB12, FOLATE, FERRITIN, TIBC, IRON, RETICCTPCT in the last 72 hours. Urine analysis:    Component Value Date/Time   COLORURINE YELLOW 08/27/2019 0500   APPEARANCEUR CLEAR 08/27/2019 0500   LABSPEC 1.015 08/27/2019 0500   PHURINE 5.0 08/27/2019 0500   GLUCOSEU NEGATIVE 08/27/2019 0500   HGBUR NEGATIVE 08/27/2019 0500   BILIRUBINUR NEGATIVE 08/27/2019 0500   KETONESUR NEGATIVE 08/27/2019 0500   PROTEINUR NEGATIVE 08/27/2019 0500   UROBILINOGEN 1.0 03/04/2013 1230   NITRITE NEGATIVE 08/27/2019 0500   LEUKOCYTESUR NEGATIVE 08/27/2019 0500   Sepsis  Labs: Invalid input(s): PROCALCITONIN, [place]  Microbiology: Recent Results (from the past 240 hour(s))  Culture, blood (routine x 2)     Status: None (Preliminary result)   Collection Time: 08/26/19  2:32 PM   Specimen: BLOOD LEFT HAND  Result Value Ref Range Status   Specimen Description BLOOD LEFT HAND  Final   Special Requests   Final    BOTTLES DRAWN AEROBIC ONLY Blood Culture results may not be optimal due to an inadequate volume of blood received in culture bottles   Culture   Final    NO GROWTH 3 DAYS Performed at [facility], [place] [address], [place], [place] [address]    Report Status PENDING  Incomplete  Culture, blood (routine x 2)     Status: None (Preliminary result)   Collection Time: 08/26/19  2:32 PM   Specimen: BLOOD LEFT WRIST  Result Value Ref Range Status   Specimen Description BLOOD LEFT WRIST  Final   Special Requests   Final    BOTTLES DRAWN AEROBIC ONLY Blood Culture adequate volume   Culture   Final    NO GROWTH 3 DAYS Performed at [facility], [place] [address] [place], [place] [address]    Report Status PENDING  Incomplete  SARS CORONAVIRUS 2 (TAT 6-24 HRS) Nasopharyngeal Nasopharyngeal Swab     Status: None   Collection Time: 08/28/19 10:01 AM   Specimen: Nasopharyngeal Swab  Result Value Ref Range Status   SARS Coronavirus 2 NEGATIVE NEGATIVE Final    Comment: (NOTE) SARS-CoV-2 target nucleic acids are NOT DETECTED. The SARS-CoV-2 RNA is generally detectable in upper and lower respiratory specimens during the acute phase of infection. Negative results do not preclude SARS-CoV-2 infection, do not rule out co-infections with other pathogens, and should not be used as the sole basis for treatment or other patient management decisions. Negative results must be combined with clinical observations, patient history, and epidemiological information. The expected result is Negative. Fact Sheet for  Patients: SugarRoll.be Fact Sheet for Healthcare Providers: https://www.woods-mathews.com/ This test is not yet approved or cleared by the [place] FDA and  has been authorized for detection and/or diagnosis of SARS-CoV-2 by FDA under an Emergency Use Authorization (EUA). This EUA will remain  in effect (meaning this test can be used) for the duration of the COVID-19 declaration under Section 56 4(b)(1) of the Act, 21 U.S.C. section 360bbb-3(b)(1), unless the authorization is terminated or revoked sooner. Performed at [facility], [place] [address] [place], [place] [address]   Gram stain     Status:  None   Collection Time: 08/28/19  4:06 PM   Specimen: Pleura  Result Value Ref Range Status   Specimen Description PLEURAL FLUID RIGHT LUNG  Final   Special Requests NONE  Final   Gram Stain   Final    WBC PRESENT, PREDOMINANTLY MONONUCLEAR NO ORGANISMS SEEN CYTOSPIN SMEAR Performed at [facility], [place] [address] [place], [place] [address]    Report Status 08/28/2019 FINAL  Final  Culture, body fluid-bottle     Status: None (Preliminary result)   Collection Time: 08/28/19  4:06 PM   Specimen: Pleura  Result Value Ref Range Status   Specimen Description PLEURAL FLUID RIGHT LUNG  Final   Special Requests NONE  Final   Culture   Final    NO GROWTH < 24 HOURS Performed at [facility], [place] [address] [place], [place] [address]    Report Status PENDING  Incomplete    Radiology Studies: DG Chest 1 View  Result Date: 08/28/2019 CLINICAL DATA:  Status post thoracentesis. EXAM: CHEST  1 VIEW COMPARISON:  August 27, 2019 FINDINGS: The right-sided PICC line is well position. The right-sided pleural effusion has decreased since the prior study. There remains a small right-sided effusion with adjacent airspace disease favored to represent atelectasis. The heart size is enlarged. The patient is status post prior median sternotomy  and atrial appendage clip placement. There is a probable small left-sided pleural effusion. IMPRESSION: 1. No pneumothorax status post right-sided thoracentesis. 2. Significant interval decrease in size of the right-sided pleural effusion. 3. Cardiomegaly with persistent findings of vascular congestion. 4. Persistent small left-sided pleural effusion. Electronically Signed   By: Constance Holster M.D.   On: 08/28/2019 16:23   IR THORACENTESIS ASP PLEURAL SPACE W/IMG GUIDE  Result Date: 08/28/2019 INDICATION: Patient with history of heart failure, COPD, dyspnea, and bilateral pleural effusions, right > left. Request is made for diagnostic and therapeutic right thoracentesis. EXAM: ULTRASOUND GUIDED DIAGNOSTIC AND THERAPEUTIC RIGHT THORACENTESIS MEDICATIONS: 10 mL 1% lidocaine COMPLICATIONS: None immediate. PROCEDURE: An ultrasound guided thoracentesis was thoroughly discussed with the patient and questions answered. The benefits, risks, alternatives and complications were also discussed. The patient understands and wishes to proceed with the procedure. Written consent was obtained. Ultrasound was performed to localize and mark an adequate pocket of fluid in the right chest. The area was then prepped and draped in the normal sterile fashion. 1% Lidocaine was used for local anesthesia. Under ultrasound guidance a 6 Fr Safe-T-Centesis catheter was introduced. Thoracentesis was performed. The catheter was removed and a dressing applied. FINDINGS: A total of approximately 800 mL of hazy amber fluid was removed. Samples were sent to the laboratory as requested by the clinical team. IMPRESSION: Successful ultrasound guided right thoracentesis yielding 800 mL of pleural fluid. Read by: Earley Abide, PA-C Electronically Signed   By: Jacqulynn Cadet M.D.   On: 08/28/2019 16:26   45 minutes with more than 50% spent in reviewing records, counseling patient/family and coordinating care.   Jeffey Janssen T. [place]  If 7PM-7AM, please contact night-coverage [email or web address] Password [email or web address] 08/29/2019, 1:23 PM

## 2019-08-30 DIAGNOSIS — D509 Iron deficiency anemia, unspecified: Secondary | ICD-10-CM

## 2019-08-30 LAB — RENAL FUNCTION PANEL
Albumin: 2.9 g/dL — ABNORMAL LOW (ref 3.5–5.0)
Anion gap: 14 (ref 5–15)
BUN: 43 mg/dL — ABNORMAL HIGH (ref 8–23)
CO2: 31 mmol/L (ref 22–32)
Calcium: 8.8 mg/dL — ABNORMAL LOW (ref 8.9–10.3)
Chloride: 94 mmol/L — ABNORMAL LOW (ref 98–111)
Creatinine, Ser: 1.43 mg/dL — ABNORMAL HIGH (ref 0.44–1.00)
GFR calc Af Amer: 39 mL/min — ABNORMAL LOW (ref >60)
GFR calc non Af Amer: 34 mL/min — ABNORMAL LOW (ref >60)
Glucose, Bld: 166 mg/dL — ABNORMAL HIGH (ref 70–99)
Phosphorus: 4.8 mg/dL — ABNORMAL HIGH (ref 2.5–4.6)
Potassium: 3.2 mmol/L — ABNORMAL LOW (ref 3.5–5.1)
Sodium: 139 mmol/L (ref 135–145)

## 2019-08-30 LAB — HEMOGLOBIN AND HEMATOCRIT, BLOOD
HCT: 24.5 % — ABNORMAL LOW (ref 36.0–46.0)
Hemoglobin: 7.3 g/dL — ABNORMAL LOW (ref 12.0–15.0)

## 2019-08-30 LAB — PREPARE RBC (CROSSMATCH)

## 2019-08-30 LAB — COOXEMETRY PANEL
Carboxyhemoglobin: 2 % — ABNORMAL HIGH (ref 0.5–1.5)
Methemoglobin: 1 % (ref 0.0–1.5)
O2 Saturation: 82.9 %
Total hemoglobin: 7.4 g/dL — ABNORMAL LOW (ref 12.0–16.0)

## 2019-08-30 LAB — MAGNESIUM: Magnesium: 2.4 mg/dL (ref 1.7–2.4)

## 2019-08-30 MED ORDER — SODIUM CHLORIDE 0.9 % IV SOLN
510.0000 mg | Freq: Once | INTRAVENOUS | Status: AC
  Administered 2019-08-30: 510 mg via INTRAVENOUS
  Filled 2019-08-30 (×2): qty 17

## 2019-08-30 MED ORDER — TORSEMIDE 20 MG PO TABS
60.0000 mg | ORAL_TABLET | Freq: Every day | ORAL | Status: DC
  Administered 2019-08-31: 60 mg via ORAL
  Filled 2019-08-30: qty 3

## 2019-08-30 MED ORDER — POTASSIUM CHLORIDE CRYS ER 20 MEQ PO TBCR
40.0000 meq | EXTENDED_RELEASE_TABLET | Freq: Once | ORAL | Status: AC
  Administered 2019-08-30: 40 meq via ORAL
  Filled 2019-08-30: qty 2

## 2019-08-30 MED ORDER — SODIUM CHLORIDE 0.9% IV SOLUTION: Freq: Once | INTRAVENOUS | Status: AC

## 2019-08-30 MED ORDER — IMATINIB MESYLATE 100 MG PO TABS
100.0000 mg | ORAL_TABLET | Freq: Every day | ORAL | Status: DC
  Administered 2019-08-31 – 2019-09-01 (×2): 100 mg via ORAL
  Filled 2019-08-30 (×3): qty 1

## 2019-08-30 NOTE — Progress Notes (Signed)
PROGRESS NOTE  Nicole [name] [id number] DOB: Aug 06, 1935   PCP: Frances Maywood, FNP  Patient is from: SNF  DOA: 08/25/2019 LOS: 5  Brief Narrative / Interim history: 84 year old female with history of CML on Gleevec, COPD, diastolic CHF, bioprosthetic AVR, and A. fib on Eliquis presenting with shortness of breath, wheeze, orthopnea and LE edema.  In ED, saturating in the mid 80s on RA.  In A. fib with RVR.  BNP in 700s.  Pro-Cal negative.  CBC with chronic leukocytosis, anemia and thrombocytosis.  Started on BiPAP, Rocephin, albuterol, IV Cardizem and breathing treatments, and admitted for diastolic CHF exacerbation, A. fib with RVR and right-sided pleural effusion.  Advanced HF team consulted, and started on high-dose diuretics.  IR performed thoracocentesis with removal of 800 cc transudative fluid by protein.  Pleural fluid and blood cultures negative so far.  Subjective: Seen and examined earlier this morning.  No major events overnight of this morning.  No complaints.  Breathing improved.  She denies chest pain, dyspnea, GI or UTI symptoms.  Denies melena or hematochezia.  However, patient's daughter thinks his stool is very dark.  Objective: Vitals:   08/30/19 0845 08/30/19 0856 08/30/19 1045 08/30/19 1105  BP:   97/62 102/63  Pulse:   94 93  Resp:   (!) 22 18  Temp:   97.7 F (36.5 C) 97.9 F (36.6 C)  TempSrc:   Oral Oral  SpO2:  98% 94% 97%  Weight: 84.9 kg       Intake/Output Summary (Last 24 hours) at 08/30/2019 1251 Last data filed at 08/30/2019 [id number] Gross per 24 hour  Intake 730 ml  Output 2750 ml  Net -2020 ml   Filed Weights   08/26/19 0400 08/27/19 0500 08/30/19 0845  Weight: 86.2 kg 89.1 kg 84.9 kg    Examination:  GENERAL: No acute distress.  Appears well.  HEENT: MMM.  Vision and hearing grossly intact.  NECK: Supple.  No apparent JVD.  RESP: 100% on 3 L by .  No IWOB.  Fair aeration but limited exam due to body habitus. CVS:  RRR. Heart sounds  normal.  ABD/GI/GU: Bowel sounds present. Soft. Non tender but limited exam.  MSK/EXT:  Moves extremities. No apparent deformity.  1+ edema bilaterally. SKIN: no apparent skin lesion or wound NEURO: Awake, alert and oriented appropriately.  No apparent focal neuro deficit. PSYCH: Calm. Normal affect.  Procedures:  3/30-thoracocentesis with removal of 800 cc transudative fluid  Assessment & Plan: Acute respiratory failure with hypoxia-multifactorial including CHF, COPD and pleural effusion. -Wean oxygen as able-minimal oxygen to keep saturation above 90%.  Not on oxygen at home. -Treat treatable causes.  Acute on chronic diastolic CHF, right> left: Limited Echo with EF of 60 to 65%, no RWMA, biatrial enlargement, moderately reduced RVSF.  Hypotension and AKI limiting diuresis.  Diuresed well with IV Lasix.  She has had about 2.7 L UOP/24 hours.  Net -4 L so far.  CVP 7-8. -Advanced HF team managing-holding Lasix.  Plan to start torsemide 60 mg daily on 4/2. -GDMT-metoprolol XL 25 mg daily -Monitor fluid status, renal function and electrolytes.  COPD exacerbation: Improving. Completed steroid course. -Continue prednisone and breathing treatments -Wean oxygen as able as above.  Moderate right-sided pleural effusion: s/p thoracocentesis with removal of 800 cc culture negative transudate of fluid by protein -Diuretics as above.  A. fib with RVR: RVR resolved. -Continue metoprolol XL, Cardizem CD and Eliquis.  History of bioprosthetic AVR/MV repair: Stable  Large  left-sided multinodular goiter with significant substernal component -Outpatient follow-up.  CML/thrombocytosis: Recently started on [place] secure chart to her oncologist.  Iron deficiency anemia: Baseline Hgb 8-9> 8.5 (admit)>>> 7.7> 7.3.  Iron sat 1%.  She denies melena or hematochezia.  Could be due to CML.  -Transfuse 1 units -IV Feraheme -Check Hemoccult  Midline abdominal hernia: No  incarceration. -Supportive care  Hypotension: Improved. -Per cardiology.  Debility/generalized weakness -PT/OT  DNR/DNI: Appropriate.                     DVT prophylaxis: On Eliquis Code Status: DNR/DNI Family Communication: Updated patient's daughter at bedside.  Discharge barrier: Fluid overload/acute diastolic CHF complicated by hypotension and AKI.  Also anemia requiring blood transfusion. Patient is from: SNF Final disposition: Likely SNF once adequately diuresed and cleared by advanced HF team.  Consultants: Advanced HF team   Microbiology summarized: 3/30-COVID-19 negative. 3/29-blood cultures negative. 3/30-pleural fluid cultures negative.  Sch Meds:  Scheduled Meds: . apixaban  5 mg Oral BID  . arformoterol  15 mcg Nebulization BID  . atorvastatin  40 mg Oral q1800  . budesonide  0.5 mg Nebulization BID  . busPIRone  10 mg Oral BID  . Chlorhexidine Gluconate Cloth  6 each Topical Daily  . diltiazem  180 mg Oral Daily  . DULoxetine  20 mg Oral Daily  . metoprolol succinate  25 mg Oral Daily  . predniSONE  40 mg Oral Q breakfast  . sodium chloride flush  10-40 mL Intracatheter Q12H  . sodium chloride flush  3 mL Intravenous Q12H  . [START ON 08/31/2019] torsemide  60 mg Oral Daily   Continuous Infusions: . sodium chloride     PRN Meds:.sodium chloride, acetaminophen, albuterol, ALPRAZolam, lidocaine (PF), lip balm, ondansetron (ZOFRAN) IV, sodium chloride flush, sodium chloride flush  Antimicrobials: Anti-infectives (From admission, onward)   Start     Dose/Rate Route Frequency Ordered Stop   08/25/19 1930  azithromycin (ZITHROMAX) 500 mg in sodium chloride 0.9 % 250 mL IVPB  Status:  Discontinued     500 mg 250 mL/hr over 60 Minutes Intravenous Every 24 hours 08/25/19 0649 08/30/19 0700       I have personally reviewed the following labs and images: CBC: Recent Labs  Lab 08/25/19 1555 08/26/19 0218 08/28/19 0545 08/28/19 2000  08/30/19 0500  WBC 13.6* 17.1* 13.3* 14.5*  --   NEUTROABS 12.0* 15.4*  --   --   --   HGB 8.5* 8.0* 7.2* 7.7* 7.3*  HCT 28.8* 27.3* 24.6* 26.0* 24.5*  MCV 85.5 87.2 87.5 86.1  --   PLT 1,633* 1,393* 1,516* 1,594*  --    BMP &GFR Recent Labs  Lab 08/26/19 0218 08/27/19 0242 08/28/19 0545 08/29/19 0649 08/30/19 0500  NA 137 138 138 136 139  K 4.5 5.3* 4.9 4.1 3.2*  CL 97* 98 100 99 94*  CO2 29 30 28 29 31   GLUCOSE 187* 165* 128* 103* 166*  BUN 29* 52* 54* 47* 43*  CREATININE 1.21* 1.65* 1.52* 1.28* 1.43*  CALCIUM 9.0 9.0 9.0 8.5* 8.8*  MG  --   --   --  2.7* 2.4  PHOS  --   --   --   --  4.8*   Estimated Creatinine Clearance: 31.3 mL/min (A) (by C-G formula based on SCr of 1.43 mg/dL (H)). Liver & Pancreas: Recent Labs  Lab 08/28/19 2000 08/30/19 0500  PROT 5.4*  --   ALBUMIN 3.1* 2.9*  No results for input(s): LIPASE, AMYLASE in the last 168 hours. No results for input(s): AMMONIA in the last 168 hours. Diabetic: No results for input(s): HGBA1C in the last 72 hours. No results for input(s): GLUCAP in the last 168 hours. Cardiac Enzymes: No results for input(s): CKTOTAL, CKMB, CKMBINDEX, TROPONINI in the last 168 hours. No results for input(s): PROBNP in the last 8760 hours. Coagulation Profile: No results for input(s): INR, PROTIME in the last 168 hours. Thyroid Function Tests: No results for input(s): TSH, T4TOTAL, FREET4, T3FREE, THYROIDAB in the last 72 hours. Lipid Profile: No results for input(s): CHOL, HDL, LDLCALC, TRIG, CHOLHDL, LDLDIRECT in the last 72 hours. Anemia Panel: Recent Labs    08/29/19 1321  VITAMINB12 407  FOLATE 15.5  FERRITIN 20  TIBC 479*  IRON 7*  RETICCTPCT 3.4*   Urine analysis:    Component Value Date/Time   COLORURINE YELLOW 08/27/2019 0500   APPEARANCEUR CLEAR 08/27/2019 0500   LABSPEC 1.015 08/27/2019 0500   PHURINE 5.0 08/27/2019 0500   GLUCOSEU NEGATIVE 08/27/2019 0500   HGBUR NEGATIVE 08/27/2019 0500    BILIRUBINUR NEGATIVE 08/27/2019 0500   KETONESUR NEGATIVE 08/27/2019 0500   PROTEINUR NEGATIVE 08/27/2019 0500   UROBILINOGEN 1.0 03/04/2013 1230   NITRITE NEGATIVE 08/27/2019 0500   LEUKOCYTESUR NEGATIVE 08/27/2019 0500   Sepsis Labs: Invalid input(s): PROCALCITONIN, [place]  Microbiology: Recent Results (from the past 240 hour(s))  Culture, blood (routine x 2)     Status: None (Preliminary result)   Collection Time: 08/26/19  2:32 PM   Specimen: BLOOD LEFT HAND  Result Value Ref Range Status   Specimen Description BLOOD LEFT HAND  Final   Special Requests   Final    BOTTLES DRAWN AEROBIC ONLY Blood Culture results may not be optimal due to an inadequate volume of blood received in culture bottles   Culture   Final    NO GROWTH 4 DAYS Performed at [facility], [place] [address], [place], [place] [address]    Report Status PENDING  Incomplete  Culture, blood (routine x 2)     Status: None (Preliminary result)   Collection Time: 08/26/19  2:32 PM   Specimen: BLOOD LEFT WRIST  Result Value Ref Range Status   Specimen Description BLOOD LEFT WRIST  Final   Special Requests   Final    BOTTLES DRAWN AEROBIC ONLY Blood Culture adequate volume   Culture   Final    NO GROWTH 4 DAYS Performed at [facility], [place] [address] [place], [place] [address]    Report Status PENDING  Incomplete  SARS CORONAVIRUS 2 (TAT 6-24 HRS) Nasopharyngeal Nasopharyngeal Swab     Status: None   Collection Time: 08/28/19 10:01 AM   Specimen: Nasopharyngeal Swab  Result Value Ref Range Status   SARS Coronavirus 2 NEGATIVE NEGATIVE Final    Comment: (NOTE) SARS-CoV-2 target nucleic acids are NOT DETECTED. The SARS-CoV-2 RNA is generally detectable in upper and lower respiratory specimens during the acute phase of infection. Negative results do not preclude SARS-CoV-2 infection, do not rule out co-infections with other pathogens, and should not be used as the sole basis for treatment or  other patient management decisions. Negative results must be combined with clinical observations, patient history, and epidemiological information. The expected result is Negative. Fact Sheet for Patients: SugarRoll.be Fact Sheet for Healthcare Providers: https://www.woods-mathews.com/ This test is not yet approved or cleared by the [place] FDA and  has been authorized for detection and/or diagnosis  of SARS-CoV-2 by FDA under an Emergency Use Authorization (EUA). This EUA will remain  in effect (meaning this test can be used) for the duration of the COVID-19 declaration under Section 56 4(b)(1) of the Act, 21 U.S.C. section 360bbb-3(b)(1), unless the authorization is terminated or revoked sooner. Performed at [facility], [place] [address], [place], [place] [address]   Gram stain     Status: None   Collection Time: 08/28/19  4:06 PM   Specimen: Pleura  Result Value Ref Range Status   Specimen Description PLEURAL FLUID RIGHT LUNG  Final   Special Requests NONE  Final   Gram Stain   Final    WBC PRESENT, PREDOMINANTLY MONONUCLEAR NO ORGANISMS SEEN CYTOSPIN SMEAR Performed at [facility], [place] [address] [place], [place] [address]    Report Status 08/28/2019 FINAL  Final  Culture, body fluid-bottle     Status: None (Preliminary result)   Collection Time: 08/28/19  4:06 PM   Specimen: Pleura  Result Value Ref Range Status   Specimen Description PLEURAL FLUID RIGHT LUNG  Final   Special Requests NONE  Final   Culture   Final    NO GROWTH 2 DAYS Performed at [facility] [address] [place], [place] [address]    Report Status PENDING  Incomplete    Radiology Studies: No results found.   Jezreel Sisk T. [place]  If 7PM-7AM, please contact night-coverage [email or web address] Password [facility] 08/30/2019, 12:51 PM

## 2019-08-30 NOTE — Progress Notes (Addendum)
Advanced Heart Failure Rounding Note   Subjective:    Yesterday diuresed with IV lasix + metolazone. Negative 2 liters.   Feeling better. Denies SOB.   Objective:   Weight Range:  Vital Signs:   Temp:  [97.7 F (36.5 C)-98.4 F (36.9 C)] 97.9 F (36.6 C) (04/01 1105) Pulse Rate:  [85-95] 93 (04/01 1105) Resp:  [15-22] 18 (04/01 1105) BP: (87-107)/(44-63) 102/63 (04/01 1105) SpO2:  [94 %-100 %] 97 % (04/01 1105) Weight:  [84.9 kg] 84.9 kg (04/01 0845) Last BM Date: 08/28/19  Weight change: Filed Weights   08/26/19 0400 08/27/19 0500 08/30/19 0845  Weight: 86.2 kg 89.1 kg 84.9 kg    Intake/Output:   Intake/Output Summary (Last 24 hours) at 08/30/2019 1126 Last data filed at 08/30/2019 [id number] Gross per 24 hour  Intake 730 ml  Output 2750 ml  Net -2020 ml    CVP 7-8  Physical Exam: General: In bed.  No resp difficulty HEENT: normal anicteric  Neck: supple. JVP 7-8 CV waves  . Carotids 2+ bilat; no bruits. No lymphadenopathy or thryomegaly appreciated. Cor: PMI nondisplaced. Irregular rate & rhythm. No rubs, gallops. +TR. Lungs: clear no wheeze Abdomen: soft, nontender, nondistended. No hepatosplenomegaly. No bruits or masses. Good bowel sounds. Extremities: no cyanosis, clubbing, rash, tr edema Neuro: alert & oriented x 3, cranial nerves grossly intact. moves all 4 extremities w/o difficulty. Affect pleasant   Telemetry: A fib 90s Personally reviewed   Labs: Basic Metabolic Panel: Recent Labs  Lab 08/26/19 0218 08/26/19 0218 08/27/19 0242 08/27/19 0242 08/28/19 0545 08/29/19 0649 08/30/19 0500  NA 137  --  138  --  138 136 139  K 4.5  --  5.3*  --  4.9 4.1 3.2*  CL 97*  --  98  --  100 99 94*  CO2 29  --  30  --  28 29 31   GLUCOSE 187*  --  165*  --  128* 103* 166*  BUN 29*  --  52*  --  54* 47* 43*  CREATININE 1.21*  --  1.65*  --  1.52* 1.28* 1.43*  CALCIUM 9.0   < > 9.0   < > 9.0 8.5* 8.8*  MG  --   --   --   --   --  2.7* 2.4  PHOS  --   --    --   --   --   --  4.8*   < > = values in this interval not displayed.    Liver Function Tests: Recent Labs  Lab 08/28/19 2000 08/30/19 0500  PROT 5.4*  --   ALBUMIN 3.1* 2.9*   No results for input(s): LIPASE, AMYLASE in the last 168 hours. No results for input(s): AMMONIA in the last 168 hours.  CBC: Recent Labs  Lab 08/25/19 1555 08/26/19 0218 08/28/19 0545 08/28/19 2000 08/30/19 0500  WBC 13.6* 17.1* 13.3* 14.5*  --   NEUTROABS 12.0* 15.4*  --   --   --   HGB 8.5* 8.0* 7.2* 7.7* 7.3*  HCT 28.8* 27.3* 24.6* 26.0* 24.5*  MCV 85.5 87.2 87.5 86.1  --   PLT 1,633* 1,393* 1,516* 1,594*  --     Cardiac Enzymes: No results for input(s): CKTOTAL, CKMB, CKMBINDEX, TROPONINI in the last 168 hours.  BNP: BNP (last 3 results) Recent Labs    07/30/19 0454 08/01/19 0528 08/28/19 0545  BNP 822.0* 737.0* 517.7*    ProBNP (last 3 results) No results for input(s):  PROBNP in the last 8760 hours.    Other results:  Imaging: DG Chest 1 View  Result Date: 08/28/2019 CLINICAL DATA:  Status post thoracentesis. EXAM: CHEST  1 VIEW COMPARISON:  August 27, 2019 FINDINGS: The right-sided PICC line is well position. The right-sided pleural effusion has decreased since the prior study. There remains a small right-sided effusion with adjacent airspace disease favored to represent atelectasis. The heart size is enlarged. The patient is status post prior median sternotomy and atrial appendage clip placement. There is a probable small left-sided pleural effusion. IMPRESSION: 1. No pneumothorax status post right-sided thoracentesis. 2. Significant interval decrease in size of the right-sided pleural effusion. 3. Cardiomegaly with persistent findings of vascular congestion. 4. Persistent small left-sided pleural effusion. Electronically Signed   By: Constance Holster M.D.   On: 08/28/2019 16:23   IR THORACENTESIS ASP PLEURAL SPACE W/IMG GUIDE  Result Date: 08/28/2019 INDICATION: Patient with  history of heart failure, COPD, dyspnea, and bilateral pleural effusions, right > left. Request is made for diagnostic and therapeutic right thoracentesis. EXAM: ULTRASOUND GUIDED DIAGNOSTIC AND THERAPEUTIC RIGHT THORACENTESIS MEDICATIONS: 10 mL 1% lidocaine COMPLICATIONS: None immediate. PROCEDURE: An ultrasound guided thoracentesis was thoroughly discussed with the patient and questions answered. The benefits, risks, alternatives and complications were also discussed. The patient understands and wishes to proceed with the procedure. Written consent was obtained. Ultrasound was performed to localize and mark an adequate pocket of fluid in the right chest. The area was then prepped and draped in the normal sterile fashion. 1% Lidocaine was used for local anesthesia. Under ultrasound guidance a 6 Fr Safe-T-Centesis catheter was introduced. Thoracentesis was performed. The catheter was removed and a dressing applied. FINDINGS: A total of approximately 800 mL of hazy amber fluid was removed. Samples were sent to the laboratory as requested by the clinical team. IMPRESSION: Successful ultrasound guided right thoracentesis yielding 800 mL of pleural fluid. Read by: Earley Abide, PA-C Electronically Signed   By: Jacqulynn Cadet M.D.   On: 08/28/2019 16:26     Medications:     Scheduled Medications: . apixaban  5 mg Oral BID  . arformoterol  15 mcg Nebulization BID  . atorvastatin  40 mg Oral q1800  . budesonide  0.5 mg Nebulization BID  . busPIRone  10 mg Oral BID  . Chlorhexidine Gluconate Cloth  6 each Topical Daily  . diltiazem  180 mg Oral Daily  . DULoxetine  20 mg Oral Daily  . metoprolol succinate  25 mg Oral Daily  . predniSONE  40 mg Oral Q breakfast  . sodium chloride flush  10-40 mL Intracatheter Q12H  . sodium chloride flush  3 mL Intravenous Q12H  . [START ON 08/31/2019] torsemide  60 mg Oral Daily    Infusions: . sodium chloride      PRN Medications: sodium chloride,  acetaminophen, albuterol, ALPRAZolam, lidocaine (PF), lip balm, ondansetron (ZOFRAN) IV, sodium chloride flush, sodium chloride flush   Assessment/Plan:    1. Acute on Chronic Diastolic CHF R>L:  - Echo LVEF 60-65%, mild basal septal hypertrophy and left ventricular hypertrophy, bi atrial enlargement RV moderately down  - Admitted for NYHA Class IIIb symptoms/ acute hypoxic respiratory failure. Evidence of volume overload on exam + bilateral pleural effusions on chest CT, R>L  - attempts at diuresis limited by borderline hypotension and AKI - CVP down 7-8. Much improved. Stop IV lasix.  - Tomorrow start 60 mg torsemide daily.    2. CML:  - followed by Dr.  Katregadda. Recently diagnosed - on treatment w/ imatinib (Gleevec) (tyrosine kinase inhibitor) - given potential cardiovascular side effects, may need to hold but will need to discuss w/ oncology   3. Acute Hypoxic Respiratory Failure  - Multifactorial 2/2 acute on chronic diastolic CHF, Pleural Effusion +  COPDE - abx+ steroids + nebs for COPD. Management per primary  - HF management as outlined above - Thoracentesis with 800 cc removed.    4. Chronic Atrial Fibrillation - Rate controlled. Continue Cardizem and metoprolol for now - continue Eliquis for a/c  5. Valvular Heart Disease:  -  Severe aortic stenosis and moderate MR, s/p AVR (tissue) and MV ring with Maze procedure 03/15/13.  -Stable valves on 3/21 echo.  6. Anemia  -Hgb 7.3. No obvious source.  -Receiving 1UPRBCs.     Length of Stay: 5   Amy Clegg NP-C  08/30/2019, 11:26 AM  Advanced Heart Failure Team Pager [phone] (M-F; 7a - 4p)  Please contact [facility] for night-coverage after hours (4p -7a ) and weekends on [email or web address]  Patient seen and examined with the above-signed Advanced Practice Provider and/or Housestaff. I personally reviewed laboratory data, imaging studies and relevant notes. I independently examined the patient and formulated  the important aspects of the plan. I have edited the note to reflect any of my changes or salient points. I have personally discussed the plan with the patient and/or family.  She looks much better today after diuresis and thoracentesis. Breathing better. No orthopnea or PND. Anxious for d/c.   I spoke with her oncologist personally and given that she is on lowest possible dose of Gleevic doubt it has anything to do with her volume overload. Recommends starting abck at 100mg  daily.   Possible d/c to SNF tomorrow if otherwise ok per primary team.   Glori Bickers, MD  7:18 PM

## 2019-08-30 NOTE — Progress Notes (Signed)
Patient able to stand with 1 person assist and pivot to bedside commode.  Patient sat on side of bed for 30 min after returning to bed. Tolerated activity well.

## 2019-08-31 DIAGNOSIS — K922 Gastrointestinal hemorrhage, unspecified: Secondary | ICD-10-CM

## 2019-08-31 DIAGNOSIS — E873 Alkalosis: Secondary | ICD-10-CM

## 2019-08-31 DIAGNOSIS — R195 Other fecal abnormalities: Secondary | ICD-10-CM

## 2019-08-31 DIAGNOSIS — R710 Precipitous drop in hematocrit: Secondary | ICD-10-CM

## 2019-08-31 DIAGNOSIS — E876 Hypokalemia: Secondary | ICD-10-CM

## 2019-08-31 LAB — BASIC METABOLIC PANEL
Anion gap: 11 (ref 5–15)
BUN: 27 mg/dL — ABNORMAL HIGH (ref 8–23)
CO2: 37 mmol/L — ABNORMAL HIGH (ref 22–32)
Calcium: 9.3 mg/dL (ref 8.9–10.3)
Chloride: 91 mmol/L — ABNORMAL LOW (ref 98–111)
Creatinine, Ser: 1.34 mg/dL — ABNORMAL HIGH (ref 0.44–1.00)
GFR calc Af Amer: 43 mL/min — ABNORMAL LOW (ref >60)
GFR calc non Af Amer: 37 mL/min — ABNORMAL LOW (ref >60)
Glucose, Bld: 176 mg/dL — ABNORMAL HIGH (ref 70–99)
Potassium: 3.3 mmol/L — ABNORMAL LOW (ref 3.5–5.1)
Sodium: 139 mmol/L (ref 135–145)

## 2019-08-31 LAB — RENAL FUNCTION PANEL
Albumin: 2.9 g/dL — ABNORMAL LOW (ref 3.5–5.0)
Anion gap: 12 (ref 5–15)
BUN: 30 mg/dL — ABNORMAL HIGH (ref 8–23)
CO2: 33 mmol/L — ABNORMAL HIGH (ref 22–32)
Calcium: 9.1 mg/dL (ref 8.9–10.3)
Chloride: 90 mmol/L — ABNORMAL LOW (ref 98–111)
Creatinine, Ser: 1.3 mg/dL — ABNORMAL HIGH (ref 0.44–1.00)
GFR calc Af Amer: 44 mL/min — ABNORMAL LOW (ref >60)
GFR calc non Af Amer: 38 mL/min — ABNORMAL LOW (ref >60)
Glucose, Bld: 106 mg/dL — ABNORMAL HIGH (ref 70–99)
Phosphorus: 3.4 mg/dL (ref 2.5–4.6)
Potassium: 2.7 mmol/L — CL (ref 3.5–5.1)
Sodium: 135 mmol/L (ref 135–145)

## 2019-08-31 LAB — CBC
HCT: 27 % — ABNORMAL LOW (ref 36.0–46.0)
Hemoglobin: 8.3 g/dL — ABNORMAL LOW (ref 12.0–15.0)
MCH: 26.2 pg (ref 26.0–34.0)
MCHC: 30.7 g/dL (ref 30.0–36.0)
MCV: 85.2 fL (ref 80.0–100.0)
Platelets: 1152 10*3/uL (ref 150–400)
RBC: 3.17 MIL/uL — ABNORMAL LOW (ref 3.87–5.11)
RDW: 16.3 % — ABNORMAL HIGH (ref 11.5–15.5)
WBC: 11.1 10*3/uL — ABNORMAL HIGH (ref 4.0–10.5)
nRBC: 0.4 % — ABNORMAL HIGH (ref 0.0–0.2)

## 2019-08-31 LAB — CULTURE, BLOOD (ROUTINE X 2)
Culture: NO GROWTH
Culture: NO GROWTH
Special Requests: ADEQUATE

## 2019-08-31 LAB — TYPE AND SCREEN
ABO/RH(D): A POS
Antibody Screen: NEGATIVE
Unit division: 0

## 2019-08-31 LAB — OCCULT BLOOD X 1 CARD TO LAB, STOOL: Fecal Occult Bld: POSITIVE — AB

## 2019-08-31 LAB — BPAM RBC
Blood Product Expiration Date: 202104292359
ISSUE DATE / TIME: 202104011044
Unit Type and Rh: 6200

## 2019-08-31 LAB — COOXEMETRY PANEL
Carboxyhemoglobin: 2.2 % — ABNORMAL HIGH (ref 0.5–1.5)
Methemoglobin: 1.8 % — ABNORMAL HIGH (ref 0.0–1.5)
O2 Saturation: 73.3 %
Total hemoglobin: 8.5 g/dL — ABNORMAL LOW (ref 12.0–16.0)

## 2019-08-31 LAB — CD19 AND CD20, FLOW CYTOMETRY

## 2019-08-31 LAB — MAGNESIUM: Magnesium: 2.3 mg/dL (ref 1.7–2.4)

## 2019-08-31 MED ORDER — POTASSIUM CHLORIDE CRYS ER 20 MEQ PO TBCR
40.0000 meq | EXTENDED_RELEASE_TABLET | Freq: Two times a day (BID) | ORAL | Status: DC
  Administered 2019-08-31: 40 meq via ORAL
  Filled 2019-08-31: qty 2

## 2019-08-31 MED ORDER — PANTOPRAZOLE SODIUM 40 MG PO TBEC
40.0000 mg | DELAYED_RELEASE_TABLET | Freq: Every day | ORAL | Status: DC
  Administered 2019-08-31 – 2019-09-01 (×2): 40 mg via ORAL
  Filled 2019-08-31 (×2): qty 1

## 2019-08-31 MED ORDER — POTASSIUM CHLORIDE CRYS ER 20 MEQ PO TBCR
40.0000 meq | EXTENDED_RELEASE_TABLET | Freq: Once | ORAL | Status: AC
  Administered 2019-08-31: 40 meq via ORAL
  Filled 2019-08-31: qty 2

## 2019-08-31 MED ORDER — POTASSIUM CHLORIDE CRYS ER 20 MEQ PO TBCR
40.0000 meq | EXTENDED_RELEASE_TABLET | ORAL | Status: AC
  Administered 2019-08-31 (×3): 40 meq via ORAL
  Filled 2019-08-31 (×3): qty 2

## 2019-08-31 MED ORDER — PANTOPRAZOLE SODIUM 40 MG PO TBEC: 40.0000 mg | DELAYED_RELEASE_TABLET | Freq: Every day | ORAL | Status: DC

## 2019-08-31 MED ORDER — TORSEMIDE 20 MG PO TABS
80.0000 mg | ORAL_TABLET | Freq: Every day | ORAL | Status: DC
  Administered 2019-09-01: 80 mg via ORAL
  Filled 2019-08-31: qty 4

## 2019-08-31 MED ORDER — FUROSEMIDE 10 MG/ML IJ SOLN
40.0000 mg | Freq: Once | INTRAMUSCULAR | Status: AC
  Administered 2019-08-31: 40 mg via INTRAVENOUS
  Filled 2019-08-31: qty 4

## 2019-08-31 NOTE — Progress Notes (Signed)
PT Cancellation Note  Patient Details Name: Nicole [name] MRN: [id number] DOB: [birth year]   Cancelled Treatment:    Reason Eval/Treat Not Completed: Medical issues which prohibited therapy. Patient's potassium is 2.7 this morning. Contraindicated for PT at this time.  Will re-attempt when appropriate.     Rj Pedrosa 08/31/2019, 10:32 AM

## 2019-08-31 NOTE — Progress Notes (Signed)
Physical Therapy Treatment Patient Details Name: Nicole [name] MRN: [id number] DOB: 1935/08/19 Today's Date: 08/31/2019    History of Present Illness 84 year old female with history of CML on Gleevec, COPD, diastolic CHF, bioprosthetic AVR, and A. fib on Eliquis presenting with shortness of breath, wheeze, orthopnea and LE edema. Pt admitted for diastolic CHF exacerbation, Afib with RVR, and R pleural effusion. Pt underwent thoracentesis on 3/30.    PT Comments    Patient received in bed, daughter present for PT session. Patient agreeable to PT. Reports she is feeling lousy. She requires min assist with supine to sit. Min assist for sit to stand with cues for hand placement. Min assist with ambulation using RW 20 feet. Required +2 assist to manage equipment and for safety.  Patient's O2 saturation on room air dropped to 87% with ambulation. Cues given for pursed lip breathing. Patient needed to return to chair after ambulating 10 feet due to fatigue. Patient will continue to benefit from skilled PT while here to improve strength, activity tolerance and safety with mobility.     Follow Up Recommendations  SNF;Supervision/Assistance - 24 hour     Equipment Recommendations  None recommended by PT    Recommendations for Other Services       Precautions / Restrictions Precautions Precautions: Fall Precaution Comments: SpO2 Restrictions Weight Bearing Restrictions: No    Mobility  Bed Mobility Overal bed mobility: Needs Assistance Bed Mobility: Supine to Sit     Supine to sit: HOB elevated;Min assist        Transfers Overall transfer level: Needs assistance Equipment used: Rolling walker (2 wheeled) Transfers: Sit to/from Stand Sit to Stand: Min guard         General transfer comment: slow labored movement  Ambulation/Gait Ambulation/Gait assistance: Min guard Gait Distance (Feet): 20 Feet Assistive device: Rolling walker (2 wheeled) Gait Pattern/deviations: Decreased  step length - right;Decreased step length - left;Decreased stride length;Shuffle Gait velocity: decreased   General Gait Details: slow cadence, 1 standing rest due to low O2 sats 87% with ambulation. Patient demonstrates labored breathing throughout session.   Stairs             Wheelchair Mobility    Modified Rankin (Stroke Patients Only)       Balance Overall balance assessment: Needs assistance Sitting-balance support: Feet supported Sitting balance-Leahy Scale: Good     Standing balance support: Bilateral upper extremity supported;During functional activity Standing balance-Leahy Scale: Fair Standing balance comment: using RW                            Cognition Arousal/Alertness: Awake/alert Behavior During Therapy: WFL for tasks assessed/performed Overall Cognitive Status: Within Functional Limits for tasks assessed                                        Exercises      General Comments        Pertinent Vitals/Pain Pain Assessment: No/denies pain    Home Living                      Prior Function            PT Goals (current goals can now be found in the care plan section) Acute Rehab PT Goals Patient Stated Goal: To go to rehab and regain independence PT Goal Formulation: With  patient/family Time For Goal Achievement: 09/12/19 Potential to Achieve Goals: Good Additional Goals Additional Goal #1: Pt will maintain dynamic standing balance within 10 inches of her base of support with supervision and unilateral UE support of the least restrictive assistive device. Progress towards PT goals: Progressing toward goals    Frequency    Min 2X/week      PT Plan Current plan remains appropriate    Co-evaluation              AM-PAC PT "6 Clicks" Mobility   Outcome Measure  Help needed turning from your back to your side while in a flat bed without using bedrails?: A Little Help needed moving from  lying on your back to sitting on the side of a flat bed without using bedrails?: A Little Help needed moving to and from a bed to a chair (including a wheelchair)?: A Little Help needed standing up from a chair using your arms (e.g., wheelchair or bedside chair)?: A Little Help needed to walk in hospital room?: A Lot Help needed climbing 3-5 steps with a railing? : Total 6 Click Score: 15    End of Session Equipment Utilized During Treatment: Gait belt Activity Tolerance: Patient limited by fatigue Patient left: in chair;with call bell/phone within reach;with family/visitor present Nurse Communication: Mobility status PT Visit Diagnosis: Muscle weakness (generalized) (M62.81);Difficulty in walking, not elsewhere classified (R26.2)     Time: [id number] PT Time Calculation (min) (ACUTE ONLY): 25 min  Charges:  $Gait Training: 23-37 mins                     Zamorah Ailes, PT, GCS 08/31/19,3:10 PM

## 2019-08-31 NOTE — Progress Notes (Addendum)
CRITICAL VALUE ALERT  Critical Value:  K+ 2.7  Date & Time Notied:  08/31/19 0605  Provider Notified: Silas [place]  Orders Received/Actions taken: replacement ordered

## 2019-08-31 NOTE — Progress Notes (Signed)
CVP 11 

## 2019-08-31 NOTE — Progress Notes (Addendum)
Advanced Heart Failure Rounding Note   Subjective:    Feels much better. Sitting up in chair. Breathing improved. No supp O2 requirements.   Has declined invasive GI w/u.   Objective:   Weight Range:  Vital Signs:   Temp:  [97.9 F (36.6 C)-98.6 F (37 C)] 97.9 F (36.6 C) (04/02 1611) Pulse Rate:  [87-100] 95 (04/02 1611) Resp:  [15-22] 22 (04/02 1611) BP: (94-105)/(59-74) 101/60 (04/02 1611) SpO2:  [89 %-96 %] 89 % (04/02 1611) Last BM Date: 08/31/19  Weight change: Filed Weights   08/26/19 0400 08/27/19 0500 08/30/19 0845  Weight: 86.2 kg 89.1 kg 84.9 kg    Intake/Output:   Intake/Output Summary (Last 24 hours) at 08/31/2019 1614 Last data filed at 08/31/2019 1130 Gross per 24 hour  Intake 480 ml  Output 1800 ml  Net -1320 ml     Physical Exam: General: elderly WF  No resp difficulty HEENT: normal anicteric  Neck: supple. JVD just distal to jaw line + CV waves  . Carotids 2+ bilat; no bruits. No lymphadenopathy or thryomegaly appreciated. Cor: PMI nondisplaced. Irregular rate & rhythm, regular rate. No rubs, gallops. +TR. Lungs: clear no wheeze Abdomen: soft, + large ventral hernia (soft and reducible) nontender, nondistended. No hepatosplenomegaly. No bruits or masses. Good bowel sounds. Extremities: no cyanosis, clubbing, rash, tace bilateral LE, R>L Neuro: alert & oriented x 3, cranial nerves grossly intact. moves all 4 extremities w/o difficulty. Affect pleasant   Telemetry: A fib 70s Personally reviewed   Labs: Basic Metabolic Panel: Recent Labs  Lab 08/27/19 0242 08/27/19 0242 08/28/19 0545 08/28/19 0545 08/29/19 0649 08/30/19 0500 08/31/19 0500  NA 138  --  138  --  136 139 135  K 5.3*  --  4.9  --  4.1 3.2* 2.7*  CL 98  --  100  --  99 94* 90*  CO2 30  --  28  --  29 31 33*  GLUCOSE 165*  --  128*  --  103* 166* 106*  BUN 52*  --  54*  --  47* 43* 30*  CREATININE 1.65*  --  1.52*  --  1.28* 1.43* 1.30*  CALCIUM 9.0   < > 9.0   < >  8.5* 8.8* 9.1  MG  --   --   --   --  2.7* 2.4 2.3  PHOS  --   --   --   --   --  4.8* 3.4   < > = values in this interval not displayed.    Liver Function Tests: Recent Labs  Lab 08/28/19 2000 08/30/19 0500 08/31/19 0500  PROT 5.4*  --   --   ALBUMIN 3.1* 2.9* 2.9*   No results for input(s): LIPASE, AMYLASE in the last 168 hours. No results for input(s): AMMONIA in the last 168 hours.  CBC: Recent Labs  Lab 08/25/19 1555 08/25/19 1555 08/26/19 0218 08/28/19 0545 08/28/19 2000 08/30/19 0500 08/31/19 0500  WBC 13.6*  --  17.1* 13.3* 14.5*  --  11.1*  NEUTROABS 12.0*  --  15.4*  --   --   --   --   HGB 8.5*   < > 8.0* 7.2* 7.7* 7.3* 8.3*  HCT 28.8*   < > 27.3* 24.6* 26.0* 24.5* 27.0*  MCV 85.5  --  87.2 87.5 86.1  --  85.2  PLT 1,633*  --  1,393* 1,516* 1,594*  --  1,152*   < > = values in  this interval not displayed.    Cardiac Enzymes: No results for input(s): CKTOTAL, CKMB, CKMBINDEX, TROPONINI in the last 168 hours.  BNP: BNP (last 3 results) Recent Labs    07/30/19 0454 08/01/19 0528 08/28/19 0545  BNP 822.0* 737.0* 517.7*    ProBNP (last 3 results) No results for input(s): PROBNP in the last 8760 hours.    Other results:  Imaging: No results found.   Medications:     Scheduled Medications: . apixaban  5 mg Oral BID  . arformoterol  15 mcg Nebulization BID  . atorvastatin  40 mg Oral q1800  . budesonide  0.5 mg Nebulization BID  . busPIRone  10 mg Oral BID  . Chlorhexidine Gluconate Cloth  6 each Topical Daily  . diltiazem  180 mg Oral Daily  . DULoxetine  20 mg Oral Daily  . imatinib  100 mg Oral Q breakfast  . metoprolol succinate  25 mg Oral Daily  . pantoprazole  40 mg Oral Q0600  . potassium chloride  40 mEq Oral Q4H  . sodium chloride flush  10-40 mL Intracatheter Q12H  . sodium chloride flush  3 mL Intravenous Q12H  . torsemide  60 mg Oral Daily    Infusions: . sodium chloride      PRN Medications: sodium chloride,  acetaminophen, albuterol, ALPRAZolam, lidocaine (PF), lip balm, ondansetron (ZOFRAN) IV, sodium chloride flush, sodium chloride flush   Assessment/Plan:    1. Acute on Chronic Diastolic CHF R>L:  - Echo LVEF 60-65%, mild basal septal hypertrophy and left ventricular hypertrophy, bi atrial enlargement RV moderately down  - Admitted for NYHA Class IIIb symptoms/ acute hypoxic respiratory failure. Evidence of volume overload on exam + bilateral pleural effusions on chest CT, R>L  - Volume improved w/ IV diuretics. SCr stable  - Continue PO torsemide 60 mg  daily. Supp K    2. CML:  - followed by Dr. Heber [place]. Recently diagnosed - on treatment w/ imatinib (Gleevec) (tyrosine kinase inhibitor) - discussed w/ oncology. She is on lowest possible dose of Gleevic. Doubt it has anything to do with her volume overload. Recommends starting abck at 100mg  daily.     3. Acute Hypoxic Respiratory Failure  - Multifactorial 2/2 acute on chronic diastolic CHF, Pleural Effusion +  COPDE - abx+ steroids + nebs for COPD. Management per primary  - HF management as outlined above - s/p Thoracentesis with 800 cc removed. - improved. No longer requiring supp O2  4. Chronic Atrial Fibrillation - Rate controlled. Continue Cardizem and metoprolol for now - continue Eliquis for a/c  5. Valvular Heart Disease:  -  Severe aortic stenosis and moderate MR, s/p AVR (tissue) and MV ring with Maze procedure 03/15/13.  -Stable valves on 3/21 echo.  6. Anemia  -Hgb 8.3 today after transfusion x 1 unit. No obvious source.  - GI has seen. Multiple reasons to be anemic. No strong indication to scope. Will continue to observe    7. Hypokalemia:  - K 2.7 today - received PO KCl 40 mEq x 3 - Will recheck BMP   8. Deconditioning - plan d/c to SNF  Length of Stay: [place] PA-C  08/31/2019, 4:14 PM  Advanced Heart Failure Team Pager [phone] (M-F; 7a - 4p)  Please contact [facility] for night-coverage after hours (4p -7a ) and weekends on [email or web address]  Patient seen and examined with the above-signed Advanced Practice Provider and/or Housestaff. I personally reviewed laboratory data, imaging studies and  relevant notes. I independently examined the patient and formulated the important aspects of the plan. I have edited the note to reflect any of my changes or salient points. I have personally discussed the plan with the patient and/or family.  Feeling better breathing ok. No orthopnea or PND. Eager to go back to SNF  General:  Elderly . No resp difficulty HEENT: normal Neck: supple. JVP 8-9. Carotids 2+ bilat; no bruits. No lymphadenopathy or thryomegaly appreciated. Cor: PMI nondisplaced. Irregular rate & rhythm. No rubs, gallops or murmurs. Lungs: decrease at bases Abdomen: obese soft, nontender, nondistended. No hepatosplenomegaly. No bruits or masses. Good bowel sounds. Extremities: no cyanosis, clubbing, rash, 1+ edema Neuro: alert & orientedx3, cranial nerves grossly intact. moves all 4 extremities w/o difficulty. Affect pleasant  Overall improved. Still mildly volume overloaded. Will give one dose IV lasix tonight. Need to supp K aggressively. Discussed dosing with PharmD personally.  Continue eliquis for AF. GI haas seen today and will follow anemia and +FOBT. Patient not interested in invasive w/u currently. Possible back to SNF tomorrow.  Increase torsemide to 80 daily on d/c.    Glori Bickers, MD  6:40 PM

## 2019-08-31 NOTE — Consult Note (Addendum)
[facility] Consult: 10:57 AM 08/31/2019  LOS: 6 days    Referring Provider: Dr Nicole [name]  Primary Care Physician:  Nicole Maywood, FNP Primary Gastroenterologist:  Nicole [name]    Reason for Consultation:  FOBT + in setting of Eliquis.     IMPRESSION:   *   Normocytic anemia. FOBT +. Dark stool?  Has CML and chronic anemia.  Required 1 PRBC in Early Jan 2021.  Appropriate response to PRBC x 1 yesterday.  Received Feraheme infusion. 05/2017 colonoscopy with small adenomatous polyps and normal EGD.     *    Midline ventral hernia containing stomach and transverse colon.  *   Chronic Eliquis for A. fib.  Admitted with rapid A. fib.  Previous cardioversions. Rate now controlled w cardizem.  Last dose was 930 this morning, it is not on hold.  *    Hypokalemia.  Oral potassium ordered.    *     Hypoxia.  Acute on chronic diastolic CHF.  Pleural effusion.  COPD.   Diuresed 2 liters since admission. S/p 800 mL thoracentesis.  Received Prednisone 40 mg/day 3/29 - 4/2, now dc'd.    *   CML on Gleevac.  Followed by Dr. Heber [name].   In addition to chronic anemia has chronic thrombocytosis, chronic leukocytosis  PLAN:     *   Per Dr Nicole [name].    *   Given all that is going on with the patient, ?  Initiate prophylactic PPI? I ordered Protonix 40 mg po/day.     Nicole [name]  08/31/2019, 10:57 AM Phone [place] Attending   I have taken an interval history, reviewed the chart and examined the patient. I agree with the Advanced Practitioner's note, impression and recommendations.     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  No strong indication to scope and ideally would need to be off  Eliquis.  She is not inclined to have procedures and I am not inclined to scope based upon what I know now.  Will observe - follow Hgb. If there are more signs or suggestions of bleeding will reconsider endoscopic evaluation.  I have left a message w/ her daughter Nicole [name] so that I may update her when she can call me.  Nicole Mayer, MD, [facility] 08/31/2019 3:59 PM   4:20 PM Did speak to Nicole [name] - she is comfortable with our plan and alkso heading back to rehab center tomorrow if stable to do so - I agree that Hgb needs to be rechecked there next week  Nicole Mayer, MD, [facility] 08/31/2019 4:21 PM     HPI: Nicole [name] is a 84 y.o. female.  PMH A. fib, on chronic Eliquis.  Previous cardioversions.  Diastolic CHF.  Status post 2014 tissue AVR, MV ring, maze procedure.  Severe COPD.  CML on Gleevec since late 07/2019.  Anemia.  Thrombocytosis.  Leukocytosis.  In the last 2 months has developed declining renal function, AKI versus CKD.    05/2017 colonoscopy.  For hematochezia.  Nicole [name].  Three, 3 to 4 mm polyps (TAs, no HGD) at transverse colon, cold snare resected. 05/2017 EGD.  For IDA.  Normal study.  Admitted close to 1 week ago from SNF rehab facility, where she has been living since late January 2021, with rapid A. fib, dyspnea, right pleural effusion.  Started on IV Cardizem, BiPAP, Rocephin, IV Lasix.  Status post 800 ml thoracentesis of, transudate if fluid.  Patient has improved with IV Lasix and was transitioned to torsemide.  Plans for discharge aborted due to dropping Hgb.  Stool FOBT positive.  s/p 1 unit of blood and Feraheme infusion. Hgb in early 07/2019 was 7.6 >> 8.9.  8.5 on the day of admission 3/27  >> drifted to 7.3 yesterday >> 1 PRBC >> 8.3 this AM.  MCV normal.   Platelets 1594.  WBCs 17.1 >> 11.1.   Iron 7, TIBC 479, iron sat 1%, ferritin 20.  Folate, B12 normal. Improving AKI.   Potassium today 2.7.   CT chest shows  bilateral pleural effusions with atelectasis versus consolidation.  Mild emphysema.  Large multinodular goiter with significant substernal component on the left.  Large, partially imaged ventral midline hernia containing stomach and transverse colon.  Aortic atherosclerosis.  Cardiomegaly, scattered coronary calcifications.  Patient says her appetite was poor for about 3 weeks but since admission it has improved.  She does not have nausea, vomiting.  Her stool pattern is every other day, strains to have watery stools.  No abdominal pain, no nausea or vomiting.  No dysphagia.  She does not inspect her stools but is unaware of having had any melena or blood per rectum.   Patient's daughter (RN in the ICU at [facility]) noted that the stool was dark a few days ago.  No documentation as to the appearance of last night stool which tested FOBT positive in the lab.     Past Medical History:  Diagnosis Date  . Anxiety   . Atrial fibrillation ([place])   . COPD (chronic obstructive pulmonary disease) ([place])   . Diastolic CHF ([place])   . Hyperlipidemia   . SOB (shortness of breath)    conplicated by underlying a fib with RVR,COPD, and CHF  . Valvular heart disease    a.s/p tissue AVR in 2014 with MV ring and maze procedure    Past Surgical History:  Procedure Laterality Date  . ABDOMINAL HYSTERECTOMY    . AORTIC VALVE REPLACEMENT N/A 03/05/2013   Procedure: AORTIC VALVE REPLACEMENT (AVR);  Surgeon: Nicole Pollack, MD;  Location: [place];  Service: Open Heart Surgery;  Laterality: N/A;  . BREAST BIOPSY     x3  . CARDIOVERSION N/A 02/13/2013   Procedure: CARDIOVERSION;  Surgeon: Nicole Dresser, MD;  Location[facility]COPY;  Service: Cardiovascular;  Laterality: N/A;  . CARDIOVERSION N/A 03/01/2013   Procedure: CARDIOVERSION;  Surgeon: Nicole Dresser, MD;  Location: [facility] ENDOSCOPY;  Service: Cardiovascular;  Laterality: N/A;  . CARDIOVERSION N/A 06/23/2016   Procedure: CARDIOVERSION;  Surgeon: Nicole Dresser, MD;   Location: [place];  Service: Cardiovascular;  Laterality: N/A;  . CATARACT EXTRACTION    . COLONOSCOPY WITH PROPOFOL N/A 06/24/2017   Procedure: COLONOSCOPY WITH PROPOFOL;  Surgeon: Nicole Ada, MD;  Location: [place];  Service: Endoscopy;  Laterality: N/A;  . ESOPHAGOGASTRODUODENOSCOPY N/A 06/24/2017   Procedure: ESOPHAGOGASTRODUODENOSCOPY (  EGD);  Surgeon: Nicole Ada, MD;  Location: [place];  Service: Endoscopy;  Laterality: N/A;  . INTRAOPERATIVE TRANSESOPHAGEAL ECHOCARDIOGRAM N/A 03/05/2013   Procedure: INTRAOPERATIVE TRANSESOPHAGEAL ECHOCARDIOGRAM;  Surgeon: Nicole Pollack, MD;  Location: [place] [facility];  Service: Open Heart Surgery;  Laterality: N/A;  . IR THORACENTESIS ASP PLEURAL SPACE W/IMG GUIDE  08/28/2019  . LEFT AND RIGHT HEART CATHETERIZATION WITH CORONARY ANGIOGRAM N/A 03/02/2013   Procedure: LEFT AND RIGHT HEART CATHETERIZATION WITH CORONARY ANGIOGRAM;  Surgeon: Nicole Dresser, MD;  [facility] C[facility] LAB;  Service: Cardiovascular;  Laterality: N/A;  . MAZE N/A 03/05/2013   Procedure: MAZE;  Surgeon: Nicole Pollack, MD;  Location: [place];  Service: Open Heart Surgery;  Laterality: N/A;  . MITRAL VALVE REPAIR N/A 03/05/2013   Procedure: MITRAL VALVE REPAIR (MVR);  Surgeon: Nicole Pollack, MD;  Location: [place];  Service: Open Heart Surgery;  Laterality: N/A;  . ROTATOR CUFF REPAIR    . TEE WITHOUT CARDIOVERSION N/A 03/01/2013   Procedure: TRANSESOPHAGEAL ECHOCARDIOGRAM (TEE);  Surgeon: Nicole Dresser, MD;  Location: [place];  Service: Cardiovascular;  Laterality: N/A;  talked to bev. booking no. is [id number] called trish to verify time  . TONSILLECTOMY      Prior to Admission medications   Medication Sig Start Date End Date Taking? Authorizing Provider  albuterol (VENTOLIN HFA) 108 (90 Base) MCG/ACT inhaler Inhale 2 puffs into the lungs as needed.  05/09/19  Yes [provider]  ALPRAZolam Duanne Moron) 0.5 MG tablet Take 1 tablet (0.5 mg total) by mouth 3 (three) times  daily as needed for anxiety. 08/09/19  Yes Shah, Pratik D, DO  arformoterol (BROVANA) 15 MCG/2ML NEBU Take 2 mLs (15 mcg total) by nebulization 2 (two) times daily. J 44.9 08/09/19  Yes Shah, Pratik D, DO  atorvastatin (LIPITOR) 40 MG tablet TAKE 1 TABLET BY MOUTH EVERY DAY IN THE EVENING Patient taking differently: Take 40 mg by mouth daily at 6 PM. TAKE 1 TABLET BY MOUTH EVERY DAY IN THE EVENING 03/01/19  Yes Nicole Dresser, MD  budesonide (PULMICORT) 0.5 MG/2ML nebulizer solution Take 2 mLs (0.5 mg total) by nebulization 2 (two) times daily. J 44.9 08/09/19  Yes Shah, Pratik D, DO  busPIRone (BUSPAR) 10 MG tablet Take 1 tablet (10 mg total) by mouth 2 (two) times daily. Patient taking differently: Take 10 mg by mouth daily.  08/09/19  Yes Shah, Pratik D, DO  diltiazem (CARDIZEM CD) 180 MG 24 hr capsule Take 1 capsule (180 mg total) by mouth daily. 08/10/19 09/09/19 Yes Shah, Pratik D, DO  docusate sodium (COLACE) 100 MG capsule Take 2 capsules (200 mg total) by mouth at bedtime. Patient taking differently: Take 200 mg by mouth at bedtime as needed for mild constipation.  08/09/19  Yes Shah, Pratik D, DO  DULoxetine (CYMBALTA) 20 MG capsule Take 1 capsule (20 mg total) by mouth daily. 06/24/17  Yes Nedrud, Larena Glassman, MD  ELIQUIS 5 MG TABS tablet TAKE 1 TABLET BY MOUTH TWICE A DAY Patient taking differently: Take 5 mg by mouth 2 (two) times daily.  03/21/19  Yes Nicole Dresser, MD  ergocalciferol (VITAMIN D2) 1.25 MG (50000 UT) capsule Take 1 capsule (50,000 Units total) by mouth once a week. 07/09/19  Yes Derek Jack, MD  ferrous sulfate 325 (65 FE) MG EC tablet Take 325 mg by mouth daily with breakfast.   Yes [provider]  imatinib (GLEEVEC) 100 MG tablet Take 1 tablet (100 mg total) by  mouth daily. Take with meals and large glass of water.Caution:Chemotherapy 07/24/19  Yes Derek Jack, MD  metoprolol succinate (TOPROL-XL) 25 MG 24 hr tablet Take 1 tablet (25 mg total) by  mouth daily. Patient taking differently: Take 25 mg by mouth in the morning and at bedtime.  08/10/19 09/09/19 Yes Shah, Pratik D, DO  Multiple Vitamins-Minerals (MULTIVITAMIN WITH MINERALS) tablet Take 1 tablet by mouth daily.   Yes [provider]  polyethylene glycol (MIRALAX / GLYCOLAX) 17 g packet Take 17 g by mouth daily as needed for mild constipation. 08/09/19  Yes Shah, Pratik D, DO  potassium chloride SA (KLOR-CON M20) 20 MEQ tablet Take 2 tablets (40 mEq total) by mouth 2 (two) times daily. Patient taking differently: Take 40 mEq by mouth daily.  07/17/19  Yes Nicole Dresser, MD  prochlorperazine (COMPAZINE) 10 MG tablet Take 1 tablet (10 mg total) by mouth every 6 (six) hours as needed for nausea or vomiting. 07/24/19  Yes Derek Jack, MD  torsemide (DEMADEX) 20 MG tablet Take 3 tablets (60 mg total) by mouth daily. Patient taking differently: Take 40-80 mg by mouth See admin instructions. 4 tablets in every morning and 2 tablets every other afternoon 08/10/19 09/09/19 Yes Shah, Pratik D, DO  feeding supplement, ENSURE ENLIVE, (ENSURE ENLIVE) LIQD Take 237 mLs by mouth daily. Patient not taking: Reported on 08/25/2019 08/09/19   Heath Lark D, DO    Scheduled Meds: . apixaban  5 mg Oral BID  . arformoterol  15 mcg Nebulization BID  . atorvastatin  40 mg Oral q1800  . budesonide  0.5 mg Nebulization BID  . busPIRone  10 mg Oral BID  . Chlorhexidine Gluconate Cloth  6 each Topical Daily  . diltiazem  180 mg Oral Daily  . DULoxetine  20 mg Oral Daily  . imatinib  100 mg Oral Q breakfast  . metoprolol succinate  25 mg Oral Daily  . potassium chloride  40 mEq Oral Q4H  . sodium chloride flush  10-40 mL Intracatheter Q12H  . sodium chloride flush  3 mL Intravenous Q12H  . torsemide  60 mg Oral Daily   Infusions: . sodium chloride     PRN Meds: sodium chloride, acetaminophen, albuterol, ALPRAZolam, lidocaine (PF), lip balm, ondansetron (ZOFRAN) IV, sodium chloride  flush, sodium chloride flush   Allergies as of 08/24/2019  . (No Known Allergies)    Family History  Problem Relation Age of Onset  . Breast cancer Other        family history  . Heart disease Mother   . Throat cancer Father   . Heart disease Brother   . Heart attack Son   . Breast cancer Daughter     Social History   Socioeconomic History  . Marital status: Divorced    Spouse name: Not on file  . Number of children: 2  . Years of education: Not on file  . Highest education level: Not on file  Occupational History  . Occupation: retired  Tobacco Use  . Smoking status: Former Smoker    Packs/day: 1.00    Years: 57.00    Pack years: 57.00    Types: Cigarettes    Start date: 06/12/1955    Quit date: 11/28/2012    Years since quitting: 6.7  . Smokeless tobacco: Never Used  . Tobacco comment: Quit in July 2014  Substance and Sexual Activity  . Alcohol use: No    Alcohol/week: 0.0 standard drinks  . Drug use: No  .  Sexual activity: Never  Other Topics Concern  . Not on file  Social History Narrative  . Not on file   Social Determinants of Health   Financial Resource Strain: Low Risk   . Difficulty of Paying Living Expenses: Not hard at all  Food Insecurity: No Food Insecurity  . Worried About Charity fundraiser in the Last Year: Never true  . Ran Out of Food in the Last Year: Never true  Transportation Needs: No Transportation Needs  . Lack of Transportation (Medical): No  . Lack of Transportation (Non-Medical): No  Physical Activity: Inactive  . Days of Exercise per Week: 0 days  . Minutes of Exercise per Session: 0 min  Stress: No Stress Concern Present  . Feeling of Stress : Not at all  Social Connections: Somewhat Isolated  . Frequency of Communication with Friends and Family: More than three times a week  . Frequency of Social Gatherings with Friends and Family: More than three times a week  . Attends Religious Services: 1 to 4 times per year  . Active  Member of Clubs or Organizations: No  . Attends Archivist Meetings: Never  . Marital Status: Divorced  Human resources officer Violence: Not At Risk  . Fear of Current or Ex-Partner: No  . Emotionally Abused: No  . Physically Abused: No  . Sexually Abused: No    REVIEW OF SYSTEMS: Constitutional: Weakness improved. ENT:  No nose bleeds Pulm: Shortness of breath improved.  No cough. CV:  No palpitations, improved LE edema.  Chest pain GU:  No hematuria, no frequency GI: See HPI. Heme: Patient denies unusual or excessive bleeding or bruising. Transfusions: Per epic she was transfused 2 RBCs, 2 nightlights in 2014.  2 PRBCs in 05/2017.  1 unit on 08/10/2019 at [facility].   Neuro:  No headaches, no peripheral tingling or numbness Derm:  No itching, no rash or sores.  Endocrine:  No sweats or chills.  No polyuria or dysuria Immunization: Reviewed. Travel:  None beyond [place] to [place] in the last many months   PHYSICAL EXAM: Vital signs in last 24 hours: Vitals:   08/31/19 0749 08/31/19 0831  BP: 100/60   Pulse: 94   Resp: 18   Temp: 98.3 F (36.8 C)   SpO2: 93% 95%   Wt Readings from Last 3 Encounters:  08/30/19 84.9 kg  08/09/19 85.7 kg  07/24/19 84 kg    General: Somewhat frail, pleasant, overweight, chronically ill but not toxic or acutely ill-appearing WF Head: No facial asymmetry or swelling.  No signs of head trauma. Eyes: No scleral icterus.  No conjunctival pallor. Ears: Not hard of hearing Nose: No discharge or congestion. Mouth: Oral mucosa moist, pink, clear.  Tongue midline. Neck: No masses, no thyromegaly. Lungs: Clear bilaterally.  Overall reduced breath sounds.  No cough, no dyspnea Heart: RRR.  No MRG.  S1, S2 present Abdomen: Soft, nontender.  Visible ventral hernia at upper midline, about 4 inches long.  Normal quality, active bowel sounds.  No masses or organomegaly..   Rectal: Patient preferred that I not pursue DRE. Musc/Skeltl: No joint  redness, swelling or gross deformity. Extremities: No CCE. Neurologic: Alert.  Appropriate.  Oriented x3.  Follows all commands.  No tremors.  No gross weakness.  Did not test strength but moves all her limbs. Skin: No rash, no sores, no telangiectasia Tattoos: None Nodes: No cervical adenopathy Psych: Calm, cooperative, in good spirits.  Fluid speech.   LAB RESULTS: Recent  Labs    08/28/19 2000 08/30/19 0500 08/31/19 0500  WBC 14.5*  --  11.1*  HGB 7.7* 7.3* 8.3*  HCT 26.0* 24.5* 27.0*  PLT 1,594*  --  1,152*   BMET Lab Results  Component Value Date   NA 135 08/31/2019   NA 139 08/30/2019   NA 136 08/29/2019   K 2.7 (LL) 08/31/2019   K 3.2 (L) 08/30/2019   K 4.1 08/29/2019   CL 90 (L) 08/31/2019   CL 94 (L) 08/30/2019   CL 99 08/29/2019   CO2 33 (H) 08/31/2019   CO2 31 08/30/2019   CO2 29 08/29/2019   GLUCOSE 106 (H) 08/31/2019   GLUCOSE 166 (H) 08/30/2019   GLUCOSE 103 (H) 08/29/2019   BUN 30 (H) 08/31/2019   BUN 43 (H) 08/30/2019   BUN 47 (H) 08/29/2019   CREATININE 1.30 (H) 08/31/2019   CREATININE 1.43 (H) 08/30/2019   CREATININE 1.28 (H) 08/29/2019   CALCIUM 9.1 08/31/2019   CALCIUM 8.8 (L) 08/30/2019   CALCIUM 8.5 (L) 08/29/2019   LFT Recent Labs    08/28/19 2000 08/30/19 0500 08/31/19 0500  PROT 5.4*  --   --   ALBUMIN 3.1* 2.9* 2.9*   PT/INR Lab Results  Component Value Date   INR 1.9 (H) 07/27/2019   INR 2.91 (H) 03/16/2013   INR 2.62 (H) 03/15/2013

## 2019-08-31 NOTE — TOC Progression Note (Signed)
Transition of Care [facility]) - Progression Note    Patient Details  Name: Nicole [name] MRN: [id number] Date of Birth: 1935-07-09  Transition of Care [facility]) CM/SW Contact  Pollie Friar, RN Phone Number: 08/31/2019, 12:18 PM  Clinical Narrative:    CM spoke with Marita [place] at [facility] and they are able to accept her over the weekend if d/ced. They do NOT need a FL2. They only need a d/c summary and orders. CM updated MD and asked for new covid test.  TOC following.   Expected Discharge Plan: [place] Barriers to Discharge: Continued Medical Work up  Expected Discharge Plan and Services Expected Discharge Plan: [place] In-house Referral: Clinical Social Work Discharge Planning Services: CM Consult Post Acute Care Choice: [place] arrangements for the past 2 months: [place]                                       Social Determinants of Health (SDOH) Interventions    Readmission Risk Interventions Readmission Risk Prevention Plan 08/27/2019  Transportation Screening Complete  Medication Review Press photographer) Referral to Pharmacy  PCP or Specialist appointment within 3-5 days of discharge Complete  Skilled Nursing Facility Complete  Some recent data might be hidden

## 2019-08-31 NOTE — Progress Notes (Signed)
PROGRESS NOTE  Nicole [name] [id number] DOB: 09/05/1935   PCP: Frances Maywood, FNP  Patient is from: SNF  DOA: 08/25/2019 LOS: 6  Brief Narrative / Interim history: 84 year old female with history of CML on Gleevec, COPD, diastolic CHF, bioprosthetic AVR, and A. fib on Eliquis presenting with shortness of breath, wheeze, orthopnea and LE edema.  In ED, saturating in the mid 80s on RA.  In A. fib with RVR.  BNP in 700s.  Pro-Cal negative.  CBC with chronic leukocytosis, anemia and thrombocytosis.  Started on BiPAP, Rocephin, albuterol, IV Cardizem and breathing treatments, and admitted for diastolic CHF exacerbation, A. fib with RVR and right-sided pleural effusion.  Advanced HF team consulted, and started on high-dose diuretics.  IR performed thoracocentesis with removal of 800 cc transudative fluid by protein.  Pleural fluid and blood cultures negative so far.  Patient was diuresed with high-dose IV Lasix and transition to p.o. torsemide.  She was cleared for discharge by advanced heart failure team.  However, hemoglobin dropped.  Hemoccult was positive.  She was transfused 1 unit.  Also received IV iron.  GI consulted.  Subjective: Seen and examined earlier this morning.  No major events overnight or this morning.  She thinks she might have diarrhea last night.  Not sure about melena or hematochezia.  She did not look at his stool.  She denies chest pain, dyspnea, GI or UTI symptoms.  Objective: Vitals:   08/30/19 2105 08/31/19 0340 08/31/19 0749 08/31/19 0831  BP: (!) 105/59 94/74 100/60   Pulse: 95 87 94   Resp: 19 15 18    Temp: 97.9 F (36.6 C) 97.9 F (36.6 C) 98.3 F (36.8 C)   TempSrc: Oral Oral Oral   SpO2: 91% 91% 93% 95%  Weight:        Intake/Output Summary (Last 24 hours) at 08/31/2019 1030 Last data filed at 08/31/2019 0600 Gross per 24 hour  Intake 730 ml  Output 2100 ml  Net -1370 ml   Filed Weights   08/26/19 0400 08/27/19 0500 08/30/19 0845  Weight: 86.2  kg 89.1 kg 84.9 kg    Examination:  GENERAL: No apparent distress.  Nontoxic. HEENT: MMM.  Vision and hearing grossly intact.  NECK: Supple.  No apparent JVD.  RESP: 91% on RA.  No IWOB.  Fair aeration bilaterally. CVS:  RRR. Heart sounds normal.  ABD/GI/GU: Bowel sounds present. Soft. Non tender.  MSK/EXT:  Moves extremities. No apparent deformity.  Trace edema bilaterally. SKIN: no apparent skin lesion or wound NEURO: Awake, alert and oriented appropriately.  No apparent focal neuro deficit. PSYCH: Calm. Normal affect.  Procedures:  3/30-thoracocentesis with removal of 800 cc transudative fluid  Assessment & Plan: Acute respiratory failure with hypoxia-multifactorial including CHF, COPD and pleural effusion. -Saturating in low 90s on room air today. -Treat treatable causes.  Acute on chronic diastolic CHF, right> left: Limited Echo with EF of 60 to 65%, no RWMA, biatrial enlargement, moderately reduced RVSF.  Hypotension and AKI limiting diuresis.  Diuresed well with IV Lasix.  She has had about 2.1 UOP/24 hours.  Net -5.5 L so far. Cr 1.43> 1.3 -Advanced HF team managing-started p.o. torsemide 60 mg daily -GDMT-metoprolol XL 25 mg daily -Monitor fluid status, renal function and electrolytes.  COPD exacerbation: Exacerbation resolved.  On room air.  Completed antibiotic and steroid course. -Continue breathing treatment.  Moderate right-sided pleural effusion: s/p thoracocentesis with removal of 800 cc culture negative transudate of fluid by protein -Diuretics as above.  A. fib with RVR: RVR resolved. -Continue metoprolol XL, Cardizem CD and Eliquis.  History of bioprosthetic AVR/MV repair: Stable  Large left-sided multinodular goiter with significant substernal component -Outpatient follow-up.  CML/thrombocytosis: Recently started on Gleevec. -Resume home Antares, 100 mg daily on 4/2 after discussion with her oncologist  Iron deficiency anemia/positive Hemoccult:  Baseline Hgb 8-9> 8.5 (admit)>> 7.3>1u>8.3.  Iron sat 1%.  She denies melena or hematochezia.  Hemoccult positive.  Anemia could be due to CML and/or GI bleed.  EGD and colonoscopy basically normal except for some resected sessile polyps in 2019 -IV Feraheme on 4/1.  Will give additional dose in 3 days if she remains inpatient. -[facility] consulted  Hypokalemia: Likely due to diuretics.  Magnesium was normal. -Replenish and recheck.  Mild metabolic alkalosis: Likely due to diuretics. -Continue monitoring  Midline abdominal hernia: No incarceration. -Supportive care  Hypotension: Improved. -Per cardiology.  Debility/generalized weakness -PT/OT  DNR/DNI: Appropriate.                     DVT prophylaxis: On Eliquis Code Status: DNR/DNI Family Communication: Updated patient's daughter over the phone.  Discharge barrier: Iron deficiency anemia and possible GI bleed, hypokalemia.  Needs evaluation by GI Patient is from: SNF Final disposition: Likely SNF after evaluation by GI, likely in the next 24 to 48 hours  Consultants: Advanced HF team, GI   Microbiology summarized: 3/30-COVID-19 negative. 3/29-blood cultures negative. 3/30-pleural fluid cultures negative.  Sch Meds:  Scheduled Meds: . apixaban  5 mg Oral BID  . arformoterol  15 mcg Nebulization BID  . atorvastatin  40 mg Oral q1800  . budesonide  0.5 mg Nebulization BID  . busPIRone  10 mg Oral BID  . Chlorhexidine Gluconate Cloth  6 each Topical Daily  . diltiazem  180 mg Oral Daily  . DULoxetine  20 mg Oral Daily  . imatinib  100 mg Oral Q breakfast  . metoprolol succinate  25 mg Oral Daily  . potassium chloride  40 mEq Oral Q4H  . sodium chloride flush  10-40 mL Intracatheter Q12H  . sodium chloride flush  3 mL Intravenous Q12H  . torsemide  60 mg Oral Daily   Continuous Infusions: . sodium chloride     PRN Meds:.sodium chloride, acetaminophen, albuterol, ALPRAZolam, lidocaine (PF), lip balm,  ondansetron (ZOFRAN) IV, sodium chloride flush, sodium chloride flush  Antimicrobials: Anti-infectives (From admission, onward)   Start     Dose/Rate Route Frequency Ordered Stop   08/25/19 1930  azithromycin (ZITHROMAX) 500 mg in sodium chloride 0.9 % 250 mL IVPB  Status:  Discontinued     500 mg 250 mL/hr over 60 Minutes Intravenous Every 24 hours 08/25/19 0649 08/30/19 0700       I have personally reviewed the following labs and images: CBC: Recent Labs  Lab 08/25/19 1555 08/25/19 1555 08/26/19 0218 08/28/19 0545 08/28/19 2000 08/30/19 0500 08/31/19 0500  WBC 13.6*  --  17.1* 13.3* 14.5*  --  11.1*  NEUTROABS 12.0*  --  15.4*  --   --   --   --   HGB 8.5*   < > 8.0* 7.2* 7.7* 7.3* 8.3*  HCT 28.8*   < > 27.3* 24.6* 26.0* 24.5* 27.0*  MCV 85.5  --  87.2 87.5 86.1  --  85.2  PLT 1,633*  --  1,393* 1,516* 1,594*  --  1,152*   < > = values in this interval not displayed.   BMP &GFR Recent Labs  Lab 08/27/19 0242 08/28/19 0545 08/29/19 0649 08/30/19 0500 08/31/19 0500  NA 138 138 136 139 135  K 5.3* 4.9 4.1 3.2* 2.7*  CL 98 100 99 94* 90*  CO2 30 28 29 31  33*  GLUCOSE 165* 128* 103* 166* 106*  BUN 52* 54* 47* 43* 30*  CREATININE 1.65* 1.52* 1.28* 1.43* 1.30*  CALCIUM 9.0 9.0 8.5* 8.8* 9.1  MG  --   --  2.7* 2.4 2.3  PHOS  --   --   --  4.8* 3.4   Estimated Creatinine Clearance: 34.4 mL/min (A) (by C-G formula based on SCr of 1.3 mg/dL (H)). Liver & Pancreas: Recent Labs  Lab 08/28/19 2000 08/30/19 0500 08/31/19 0500  PROT 5.4*  --   --   ALBUMIN 3.1* 2.9* 2.9*   No results for input(s): LIPASE, AMYLASE in the last 168 hours. No results for input(s): AMMONIA in the last 168 hours. Diabetic: No results for input(s): HGBA1C in the last 72 hours. No results for input(s): GLUCAP in the last 168 hours. Cardiac Enzymes: No results for input(s): CKTOTAL, CKMB, CKMBINDEX, TROPONINI in the last 168 hours. No results for input(s): PROBNP in the last 8760  hours. Coagulation Profile: No results for input(s): INR, PROTIME in the last 168 hours. Thyroid Function Tests: No results for input(s): TSH, T4TOTAL, FREET4, T3FREE, THYROIDAB in the last 72 hours. Lipid Profile: No results for input(s): CHOL, HDL, LDLCALC, TRIG, CHOLHDL, LDLDIRECT in the last 72 hours. Anemia Panel: Recent Labs    08/29/19 1321  VITAMINB12 407  FOLATE 15.5  FERRITIN 20  TIBC 479*  IRON 7*  RETICCTPCT 3.4*   Urine analysis:    Component Value Date/Time   COLORURINE YELLOW 08/27/2019 0500   APPEARANCEUR CLEAR 08/27/2019 0500   LABSPEC 1.015 08/27/2019 0500   PHURINE 5.0 08/27/2019 0500   GLUCOSEU NEGATIVE 08/27/2019 0500   HGBUR NEGATIVE 08/27/2019 0500   BILIRUBINUR NEGATIVE 08/27/2019 0500   KETONESUR NEGATIVE 08/27/2019 0500   PROTEINUR NEGATIVE 08/27/2019 0500   UROBILINOGEN 1.0 03/04/2013 1230   NITRITE NEGATIVE 08/27/2019 0500   LEUKOCYTESUR NEGATIVE 08/27/2019 0500   Sepsis Labs: Invalid input(s): PROCALCITONIN, [place]  Microbiology: Recent Results (from the past 240 hour(s))  Culture, blood (routine x 2)     Status: None (Preliminary result)   Collection Time: 08/26/19  2:32 PM   Specimen: BLOOD LEFT HAND  Result Value Ref Range Status   Specimen Description BLOOD LEFT HAND  Final   Special Requests   Final    BOTTLES DRAWN AEROBIC ONLY Blood Culture results may not be optimal due to an inadequate volume of blood received in culture bottles   Culture   Final    NO GROWTH 4 DAYS Performed at [facility], [place] [address] [place], [place] [address]    Report Status PENDING  Incomplete  Culture, blood (routine x 2)     Status: None (Preliminary result)   Collection Time: 08/26/19  2:32 PM   Specimen: BLOOD LEFT WRIST  Result Value Ref Range Status   Specimen Description BLOOD LEFT WRIST  Final   Special Requests   Final    BOTTLES DRAWN AEROBIC ONLY Blood Culture adequate volume   Culture   Final    NO GROWTH 4  DAYS Performed at [facility], [place] [address], , [place] [address]    Report Status PENDING  Incomplete  SARS CORONAVIRUS 2 (TAT 6-24 HRS) Nasopharyngeal Nasopharyngeal Swab     Status: None  Collection Time: 08/28/19 10:01 AM   Specimen: Nasopharyngeal Swab  Result Value Ref Range Status   SARS Coronavirus 2 NEGATIVE NEGATIVE Final    Comment: (NOTE) SARS-CoV-2 target nucleic acids are NOT DETECTED. The SARS-CoV-2 RNA is generally detectable in upper and lower respiratory specimens during the acute phase of infection. Negative results do not preclude SARS-CoV-2 infection, do not rule out co-infections with other pathogens, and should not be used as the sole basis for treatment or other patient management decisions. Negative results must be combined with clinical observations, patient history, and epidemiological information. The expected result is Negative. Fact Sheet for Patients: SugarRoll.be Fact Sheet for Healthcare Providers: https://www.woods-mathews.com/ This test is not yet approved or cleared by the [place] FDA and  has been authorized for detection and/or diagnosis of SARS-CoV-2 by FDA under an Emergency Use Authorization (EUA). This EUA will remain  in effect (meaning this test can be used) for the duration of the COVID-19 declaration under Section 56 4(b)(1) of the Act, 21 U.S.C. section 360bbb-3(b)(1), unless the authorization is terminated or revoked sooner. Performed at [facility], [place] [address], [place], [place] [address]   Gram stain     Status: None   Collection Time: 08/28/19  4:06 PM   Specimen: Pleura  Result Value Ref Range Status   Specimen Description PLEURAL FLUID RIGHT LUNG  Final   Special Requests NONE  Final   Gram Stain   Final    WBC PRESENT, PREDOMINANTLY MONONUCLEAR NO ORGANISMS SEEN CYTOSPIN SMEAR Performed at [facility], [place] [address], [place], [place]  [address]    Report Status 08/28/2019 FINAL  Final  Culture, body fluid-bottle     Status: None (Preliminary result)   Collection Time: 08/28/19  4:06 PM   Specimen: Pleura  Result Value Ref Range Status   Specimen Description PLEURAL FLUID RIGHT LUNG  Final   Special Requests NONE  Final   Culture   Final    NO GROWTH 2 DAYS Performed at [place] [address] [place], [place] [address]    Report Status PENDING  Incomplete    Radiology Studies: No results found.   Ridhaan Dreibelbis T. [place]  If 7PM-7AM, please contact night-coverage [email or web address] Password [email or web address] 08/31/2019, 10:30 AM

## 2019-08-31 NOTE — Progress Notes (Signed)
Pt refuses to wear Bipap 4/2.

## 2019-09-01 DIAGNOSIS — D473 Essential (hemorrhagic) thrombocythemia: Secondary | ICD-10-CM | POA: Diagnosis present

## 2019-09-01 DIAGNOSIS — K922 Gastrointestinal hemorrhage, unspecified: Secondary | ICD-10-CM | POA: Diagnosis present

## 2019-09-01 DIAGNOSIS — D62 Acute posthemorrhagic anemia: Secondary | ICD-10-CM | POA: Diagnosis present

## 2019-09-01 DIAGNOSIS — Z7901 Long term (current) use of anticoagulants: Secondary | ICD-10-CM | POA: Diagnosis not present

## 2019-09-01 DIAGNOSIS — J9600 Acute respiratory failure, unspecified whether with hypoxia or hypercapnia: Secondary | ICD-10-CM | POA: Diagnosis not present

## 2019-09-01 DIAGNOSIS — D500 Iron deficiency anemia secondary to blood loss (chronic): Secondary | ICD-10-CM

## 2019-09-01 DIAGNOSIS — E860 Dehydration: Secondary | ICD-10-CM | POA: Diagnosis not present

## 2019-09-01 DIAGNOSIS — I272 Pulmonary hypertension, unspecified: Secondary | ICD-10-CM | POA: Diagnosis present

## 2019-09-01 DIAGNOSIS — C9210 Chronic myeloid leukemia, BCR/ABL-positive, not having achieved remission: Secondary | ICD-10-CM | POA: Diagnosis not present

## 2019-09-01 DIAGNOSIS — M2550 Pain in unspecified joint: Secondary | ICD-10-CM | POA: Diagnosis not present

## 2019-09-01 DIAGNOSIS — F339 Major depressive disorder, recurrent, unspecified: Secondary | ICD-10-CM | POA: Diagnosis not present

## 2019-09-01 DIAGNOSIS — D72819 Decreased white blood cell count, unspecified: Secondary | ICD-10-CM | POA: Diagnosis present

## 2019-09-01 DIAGNOSIS — Z789 Other specified health status: Secondary | ICD-10-CM | POA: Diagnosis not present

## 2019-09-01 DIAGNOSIS — I5022 Chronic systolic (congestive) heart failure: Secondary | ICD-10-CM | POA: Diagnosis not present

## 2019-09-01 DIAGNOSIS — I499 Cardiac arrhythmia, unspecified: Secondary | ICD-10-CM | POA: Diagnosis not present

## 2019-09-01 DIAGNOSIS — Z20822 Contact with and (suspected) exposure to covid-19: Secondary | ICD-10-CM | POA: Diagnosis present

## 2019-09-01 DIAGNOSIS — J441 Chronic obstructive pulmonary disease with (acute) exacerbation: Secondary | ICD-10-CM | POA: Diagnosis not present

## 2019-09-01 DIAGNOSIS — I350 Nonrheumatic aortic (valve) stenosis: Secondary | ICD-10-CM | POA: Diagnosis present

## 2019-09-01 DIAGNOSIS — I2510 Atherosclerotic heart disease of native coronary artery without angina pectoris: Secondary | ICD-10-CM | POA: Diagnosis not present

## 2019-09-01 DIAGNOSIS — R0902 Hypoxemia: Secondary | ICD-10-CM | POA: Diagnosis not present

## 2019-09-01 DIAGNOSIS — Z66 Do not resuscitate: Secondary | ICD-10-CM | POA: Diagnosis present

## 2019-09-01 DIAGNOSIS — I5032 Chronic diastolic (congestive) heart failure: Secondary | ICD-10-CM | POA: Diagnosis present

## 2019-09-01 DIAGNOSIS — E559 Vitamin D deficiency, unspecified: Secondary | ICD-10-CM | POA: Diagnosis not present

## 2019-09-01 DIAGNOSIS — I5033 Acute on chronic diastolic (congestive) heart failure: Secondary | ICD-10-CM | POA: Diagnosis not present

## 2019-09-01 DIAGNOSIS — Z856 Personal history of leukemia: Secondary | ICD-10-CM | POA: Diagnosis not present

## 2019-09-01 DIAGNOSIS — Z79899 Other long term (current) drug therapy: Secondary | ICD-10-CM | POA: Diagnosis not present

## 2019-09-01 DIAGNOSIS — Z87891 Personal history of nicotine dependence: Secondary | ICD-10-CM | POA: Diagnosis not present

## 2019-09-01 DIAGNOSIS — E440 Moderate protein-calorie malnutrition: Secondary | ICD-10-CM | POA: Diagnosis present

## 2019-09-01 DIAGNOSIS — F419 Anxiety disorder, unspecified: Secondary | ICD-10-CM | POA: Diagnosis present

## 2019-09-01 DIAGNOSIS — E876 Hypokalemia: Secondary | ICD-10-CM | POA: Diagnosis present

## 2019-09-01 DIAGNOSIS — I482 Chronic atrial fibrillation, unspecified: Secondary | ICD-10-CM | POA: Diagnosis present

## 2019-09-01 DIAGNOSIS — I959 Hypotension, unspecified: Secondary | ICD-10-CM | POA: Diagnosis present

## 2019-09-01 DIAGNOSIS — R0689 Other abnormalities of breathing: Secondary | ICD-10-CM | POA: Diagnosis not present

## 2019-09-01 DIAGNOSIS — I5021 Acute systolic (congestive) heart failure: Secondary | ICD-10-CM | POA: Diagnosis not present

## 2019-09-01 DIAGNOSIS — R000 Tachycardia, unspecified: Secondary | ICD-10-CM | POA: Diagnosis not present

## 2019-09-01 DIAGNOSIS — K921 Melena: Secondary | ICD-10-CM | POA: Diagnosis present

## 2019-09-01 DIAGNOSIS — K9289 Other specified diseases of the digestive system: Secondary | ICD-10-CM | POA: Diagnosis not present

## 2019-09-01 DIAGNOSIS — R0600 Dyspnea, unspecified: Secondary | ICD-10-CM | POA: Diagnosis not present

## 2019-09-01 DIAGNOSIS — M1990 Unspecified osteoarthritis, unspecified site: Secondary | ICD-10-CM | POA: Diagnosis present

## 2019-09-01 DIAGNOSIS — J9601 Acute respiratory failure with hypoxia: Secondary | ICD-10-CM | POA: Diagnosis present

## 2019-09-01 DIAGNOSIS — E611 Iron deficiency: Secondary | ICD-10-CM | POA: Diagnosis present

## 2019-09-01 DIAGNOSIS — Z7401 Bed confinement status: Secondary | ICD-10-CM | POA: Diagnosis not present

## 2019-09-01 DIAGNOSIS — R069 Unspecified abnormalities of breathing: Secondary | ICD-10-CM | POA: Diagnosis not present

## 2019-09-01 DIAGNOSIS — I4891 Unspecified atrial fibrillation: Secondary | ICD-10-CM | POA: Diagnosis not present

## 2019-09-01 DIAGNOSIS — Z0289 Encounter for other administrative examinations: Secondary | ICD-10-CM | POA: Diagnosis not present

## 2019-09-01 DIAGNOSIS — R0602 Shortness of breath: Secondary | ICD-10-CM | POA: Diagnosis not present

## 2019-09-01 DIAGNOSIS — R5381 Other malaise: Secondary | ICD-10-CM | POA: Diagnosis not present

## 2019-09-01 DIAGNOSIS — D649 Anemia, unspecified: Secondary | ICD-10-CM | POA: Diagnosis present

## 2019-09-01 DIAGNOSIS — I480 Paroxysmal atrial fibrillation: Secondary | ICD-10-CM | POA: Diagnosis not present

## 2019-09-01 DIAGNOSIS — J449 Chronic obstructive pulmonary disease, unspecified: Secondary | ICD-10-CM | POA: Diagnosis present

## 2019-09-01 DIAGNOSIS — E669 Obesity, unspecified: Secondary | ICD-10-CM | POA: Diagnosis present

## 2019-09-01 DIAGNOSIS — F411 Generalized anxiety disorder: Secondary | ICD-10-CM | POA: Diagnosis not present

## 2019-09-01 LAB — CBC
HCT: 28.9 % — ABNORMAL LOW (ref 36.0–46.0)
Hemoglobin: 8.6 g/dL — ABNORMAL LOW (ref 12.0–15.0)
MCH: 25.6 pg — ABNORMAL LOW (ref 26.0–34.0)
MCHC: 29.8 g/dL — ABNORMAL LOW (ref 30.0–36.0)
MCV: 86 fL (ref 80.0–100.0)
Platelets: 1513 10*3/uL (ref 150–400)
RBC: 3.36 MIL/uL — ABNORMAL LOW (ref 3.87–5.11)
RDW: 17.1 % — ABNORMAL HIGH (ref 11.5–15.5)
WBC: 16.8 10*3/uL — ABNORMAL HIGH (ref 4.0–10.5)
nRBC: 0.4 % — ABNORMAL HIGH (ref 0.0–0.2)

## 2019-09-01 LAB — RENAL FUNCTION PANEL
Albumin: 3.1 g/dL — ABNORMAL LOW (ref 3.5–5.0)
Anion gap: 9 (ref 5–15)
BUN: 26 mg/dL — ABNORMAL HIGH (ref 8–23)
CO2: 35 mmol/L — ABNORMAL HIGH (ref 22–32)
Calcium: 9.5 mg/dL (ref 8.9–10.3)
Chloride: 95 mmol/L — ABNORMAL LOW (ref 98–111)
Creatinine, Ser: 1.26 mg/dL — ABNORMAL HIGH (ref 0.44–1.00)
GFR calc Af Amer: 46 mL/min — ABNORMAL LOW (ref >60)
GFR calc non Af Amer: 40 mL/min — ABNORMAL LOW (ref >60)
Glucose, Bld: 122 mg/dL — ABNORMAL HIGH (ref 70–99)
Phosphorus: 2.9 mg/dL (ref 2.5–4.6)
Potassium: 4.5 mmol/L (ref 3.5–5.1)
Sodium: 139 mmol/L (ref 135–145)

## 2019-09-01 LAB — COOXEMETRY PANEL
Carboxyhemoglobin: 1.7 % — ABNORMAL HIGH (ref 0.5–1.5)
Methemoglobin: 1 % (ref 0.0–1.5)
O2 Saturation: 74.4 %
Total hemoglobin: 8.9 g/dL — ABNORMAL LOW (ref 12.0–16.0)

## 2019-09-01 LAB — MAGNESIUM: Magnesium: 2.3 mg/dL (ref 1.7–2.4)

## 2019-09-01 LAB — SARS CORONAVIRUS 2 (TAT 6-24 HRS): SARS Coronavirus 2: NEGATIVE

## 2019-09-01 MED ORDER — TORSEMIDE 20 MG PO TABS: 80.0000 mg | ORAL_TABLET | Freq: Every day | ORAL | Status: AC

## 2019-09-01 MED ORDER — PANTOPRAZOLE SODIUM 40 MG PO TBEC: 40.0000 mg | DELAYED_RELEASE_TABLET | Freq: Every day | ORAL | Status: AC

## 2019-09-01 MED ORDER — POTASSIUM CHLORIDE CRYS ER 20 MEQ PO TBCR: 40.0000 meq | EXTENDED_RELEASE_TABLET | Freq: Every day | ORAL | Status: DC

## 2019-09-01 MED ORDER — FERROUS SULFATE 325 (65 FE) MG PO TBEC: 325.0000 mg | DELAYED_RELEASE_TABLET | Freq: Two times a day (BID) | ORAL | 3 refills | Status: AC

## 2019-09-01 MED ORDER — ALPRAZOLAM 0.5 MG PO TABS: 0.5000 mg | ORAL_TABLET | Freq: Two times a day (BID) | ORAL | 0 refills | Status: AC | PRN

## 2019-09-01 NOTE — Progress Notes (Signed)
   Chart review  CBC Latest Ref Rng & Units 09/01/2019 08/31/2019 08/30/2019  WBC 4.0 - 10.5 K/uL 16.8(H) 11.1(H) -  Hemoglobin 12.0 - 15.0 g/dL 8.6(L) 8.3(L) 7.3(L)  Hematocrit 36.0 - 46.0 % 28.9(L) 27.0(L) 24.5(L)  Platelets 150 - 400 K/uL 1,513(HH) 1,152(HH) -    Hgb looks stable  Call me if concerns but do not think my recommendations have changed  Gatha Mayer, MD, [facility] 09/01/2019 8:16 AM

## 2019-09-01 NOTE — Progress Notes (Signed)
Advanced Heart Failure Rounding Note   Subjective:    Feels much better today.  Weight down 5 pounds after IV Lasix yesterday.  CVP 9.  No shortness of breath orthopnea or PND.  Objective:   Weight Range:  Vital Signs:   Temp:  [97.7 F (36.5 C)-98.5 F (36.9 C)] 97.8 F (36.6 C) (04/03 0806) Pulse Rate:  [89-95] 89 (04/03 0806) Resp:  [18-22] 22 (04/03 0806) BP: (96-116)/(56-71) 116/71 (04/03 0806) SpO2:  [89 %-96 %] 92 % (04/03 0824) FiO2 (%):  [21 %] 21 % (04/02 1900) Weight:  [82.8 kg] 82.8 kg (04/03 0339) Last BM Date: 08/31/19  Weight change: Filed Weights   08/27/19 0500 08/30/19 0845 09/01/19 0339  Weight: 89.1 kg 84.9 kg 82.8 kg    Intake/Output:   Intake/Output Summary (Last 24 hours) at 09/01/2019 1117 Last data filed at 08/31/2019 1130 Gross per 24 hour  Intake --  Output 1000 ml  Net -1000 ml     Physical Exam: General: In bed.. No resp difficulty HEENT: normal Neck: supple.  JVP 9. Carotids 2+ bilat; no bruits. No lymphadenopathy or thryomegaly appreciated. Cor: PMI nondisplaced. irregular rate & rhythm. No rubs, gallops or murmurs. Lungs: clear Abdomen: Obese soft, nontender, nondistended. No hepatosplenomegaly. No bruits or masses. Good bowel sounds. Extremities: no cyanosis, clubbing, rash, edema Neuro: alert & orientedx3, cranial nerves grossly intact. moves all 4 extremities w/o difficulty. Affect pleasant    Telemetry: A fib 80s Personally reviewed   Labs: Basic Metabolic Panel: Recent Labs  Lab 08/29/19 0649 08/29/19 0649 08/30/19 0500 08/30/19 0500 08/31/19 0500 08/31/19 1634 09/01/19 0434  NA 136  --  139  --  135 139 139  K 4.1  --  3.2*  --  2.7* 3.3* 4.5  CL 99  --  94*  --  90* 91* 95*  CO2 29  --  31  --  33* 37* 35*  GLUCOSE 103*  --  166*  --  106* 176* 122*  BUN 47*  --  43*  --  30* 27* 26*  CREATININE 1.28*  --  1.43*  --  1.30* 1.34* 1.26*  CALCIUM 8.5*   < > 8.8*   < > 9.1 9.3 9.5  MG 2.7*  --  2.4  --  2.3   --  2.3  PHOS  --   --  4.8*  --  3.4  --  2.9   < > = values in this interval not displayed.    Liver Function Tests: Recent Labs  Lab 08/28/19 2000 08/30/19 0500 08/31/19 0500 09/01/19 0434  PROT 5.4*  --   --   --   ALBUMIN 3.1* 2.9* 2.9* 3.1*   No results for input(s): LIPASE, AMYLASE in the last 168 hours. No results for input(s): AMMONIA in the last 168 hours.  CBC: Recent Labs  Lab 08/25/19 1555 08/25/19 1555 08/26/19 0218 08/26/19 0218 08/28/19 0545 08/28/19 2000 08/30/19 0500 08/31/19 0500 09/01/19 0435  WBC 13.6*   < > 17.1*  --  13.3* 14.5*  --  11.1* 16.8*  NEUTROABS 12.0*  --  15.4*  --   --   --   --   --   --   HGB 8.5*   < > 8.0*   < > 7.2* 7.7* 7.3* 8.3* 8.6*  HCT 28.8*   < > 27.3*   < > 24.6* 26.0* 24.5* 27.0* 28.9*  MCV 85.5   < > 87.2  --  87.5 86.1  --  85.2 86.0  PLT 1,633*   < > 1,393*  --  1,516* 1,594*  --  1,152* 1,513*   < > = values in this interval not displayed.    Cardiac Enzymes: No results for input(s): CKTOTAL, CKMB, CKMBINDEX, TROPONINI in the last 168 hours.  BNP: BNP (last 3 results) Recent Labs    07/30/19 0454 08/01/19 0528 08/28/19 0545  BNP 822.0* 737.0* 517.7*    ProBNP (last 3 results) No results for input(s): PROBNP in the last 8760 hours.    Other results:  Imaging: No results found.   Medications:     Scheduled Medications: . apixaban  5 mg Oral BID  . arformoterol  15 mcg Nebulization BID  . atorvastatin  40 mg Oral q1800  . budesonide  0.5 mg Nebulization BID  . busPIRone  10 mg Oral BID  . Chlorhexidine Gluconate Cloth  6 each Topical Daily  . diltiazem  180 mg Oral Daily  . DULoxetine  20 mg Oral Daily  . imatinib  100 mg Oral Q breakfast  . metoprolol succinate  25 mg Oral Daily  . pantoprazole  40 mg Oral Q0600  . sodium chloride flush  10-40 mL Intracatheter Q12H  . sodium chloride flush  3 mL Intravenous Q12H  . torsemide  80 mg Oral Daily    Infusions: . sodium chloride       PRN Medications: sodium chloride, acetaminophen, albuterol, ALPRAZolam, lidocaine (PF), lip balm, ondansetron (ZOFRAN) IV, sodium chloride flush, sodium chloride flush   Assessment/Plan:    1. Acute on Chronic Diastolic CHF R>L:  - Echo LVEF 60-65%, mild basal septal hypertrophy and left ventricular hypertrophy, bi atrial enlargement RV moderately down  - Admitted for NYHA Class IIIb symptoms/ acute hypoxic respiratory failure. Evidence of volume overload on exam + bilateral pleural effusions on chest CT, R>L  - Volume improved w/ IV diuretics. SCr stable  -We have increased torsemide to 80 p.o. daily. -CVP is 9.  She is stable for discharge.  Discharge weight 182 pounds. -We have arranged follow-up in heart failure clinic   2. CML:  - followed by Dr. Heber Alston. Recently diagnosed - on treatment w/ imatinib (Gleevec) (tyrosine kinase inhibitor) - discussed w/ oncology. She is on lowest possible dose of Gleevic. Doubt it has anything to do with her volume overload.  It has been started back to 100 mg daily.    3. Acute Hypoxic Respiratory Failure  - Multifactorial 2/2 acute on chronic diastolic CHF, Pleural Effusion +  COPDE - abx+ steroids + nebs for COPD. Management per primary  - HF management as outlined above - s/p Thoracentesis with 800 cc removed. -Much improved with diuresis and right thoracentesis.  4. Chronic Atrial Fibrillation - Rate controlled. Continue Cardizem and metoprolol for now - continue Eliquis for a/c.  No obvious bleeding.  5. Valvular Heart Disease:  -  Severe aortic stenosis and moderate MR, s/p AVR (tissue) and MV ring with Maze procedure 03/15/13.  -Stable valves on 3/21 echo.  6. Anemia  -Hgb 8.6 today after transfusion x 1 unit. No obvious source.  -GI has seen.  She is not interested in invasive work-up at this point.   7. Hypokalemia:  - K 4.5 today  8. Deconditioning - plan d/c to SNF today.  She is ready from our  standpoint.  Length of Stay: 7   Glori Bickers MD 09/01/2019, 11:17 AM  Advanced Heart Failure Team Pager [phone] (M-F; 7a -  4p)  Please contact [facility] for night-coverage after hours (4p -7a ) and weekends on [email or web address]

## 2019-09-01 NOTE — Progress Notes (Signed)
DC to [facility]. Report given to [facility]

## 2019-09-01 NOTE — TOC Transition Note (Signed)
Transition of Care [facility]) - CM/SW Discharge Note   Patient Details  Name: Nicole [name] MRN: [id number] Date of Birth: 04-17-1936  [facility] CM/SW Contact:  Arvella Merles, [place] Phone Number: 09/01/2019, 2:34 PM   Clinical Narrative:    [facility] to: [facility] Anticipated DC date: 09/01/19 Family notified: Patient declined, stated she will call daughter Transport by: [facility]   Per MD patient ready for DC to [facility]. RN, patient, patient's family, and facility notified of DC. Discharge Summary and FL2 sent to facility. RN to call report prior to discharge [phone]). DC packet on chart. Ambulance transport requested for patient.   CSW will sign off for now as social work intervention is no longer needed. Please consult [place] again if new needs arise.   Final next level of care: Skilled Nursing Facility Barriers to Discharge: No Barriers Identified   Patient Goals and CMS Choice   CMS Medicare.gov Compare Post Acute Care list provided to:: Patient Choice offered to / list presented to : Patient  Discharge Placement              Patient chooses bed at: [facility] Patient to be transferred to facility by: [facility]   Patient and family notified of of transfer: 09/01/19  Discharge Plan and Services In-house Referral: Clinical Social Work Discharge Planning Services: CM Consult Post Acute Care Choice: [facility]                               Social Determinants of Health (SDOH) Interventions     Readmission Risk Interventions Readmission Risk Prevention Plan 08/27/2019  Transportation Screening Complete  Medication Review [employer]) Referral to Pharmacy  PCP or Specialist appointment within 3-5 days of discharge Complete  Skilled Nursing Facility Complete  Some recent data might be hidden

## 2019-09-01 NOTE — Discharge Summary (Signed)
Physician Discharge Summary  Nicole [name] [id number] DOB: 1936/03/02 DOA: 08/25/2019  PCP: Frances Maywood, FNP  Admit date: 08/25/2019 Discharge date: 09/01/2019  Admitted From: SNF Disposition:  SNF  Recommendations for Outpatient Follow-up:  1. Follow ups as below. 2. Please obtain CBC/BMP/Mag at follow up 3. Please follow up on the following pending results: none   Discharge Condition: Stable CODE STATUS: Full code  Follow-up Information    Nicole Dresser, MD. Go on 09/11/2019.   Specialty: Cardiology Why: 2:00 PM, [place], parking code [place] information: [place] [id number] [phone]            Hospital Course: 84 year old female with history of CML on Gleevec, COPD, diastolic CHF, bioprosthetic AVR, and A. fib on Eliquis presenting with shortness of breath, wheeze, orthopnea and LE edema.  In ED, saturating in the mid 80s on RA.  In A. fib with RVR.  BNP in 700s.  Pro-Cal negative.  CBC with chronic leukocytosis, anemia and thrombocytosis.  Started on BiPAP, Rocephin, albuterol, IV Cardizem and breathing treatments, and admitted for diastolic CHF exacerbation, A. fib with RVR and right-sided pleural effusion.  Advanced HF team consulted, and started on high-dose diuretics.  IR performed thoracocentesis with removal of 800 cc transudative fluid by protein.  Pleural fluid and blood cultures negative so far.  Patient was diuresed with high-dose IV Lasix and transition to p.o. torsemide.  She was cleared for discharge by advanced heart failure team.  However, hemoglobin dropped.   Anemia panel with severe iron deficiency. Hemoccult was positive.  She was transfused 1 unit with appropriate response.  Also received IV iron.  GI consulted and did not feel there is a strong indication to scope as H&H remained stable after transfusion.  Patient and family not interested in scoping either.   Patient will be discharged back to  SNF.  She will be on torsemide 80 mg daily per advanced HF team recommendation.  Reduced her K-dur to 40 mEq daily.  She will be on p.o. iron as well.  See individual problem list below for more hospital course.  Discharge Diagnoses:  Acute respiratory failure with hypoxia-multifactorial including CHF, COPD and pleural effusion.  Respiratory failure resolved.  On room air.  Acute on chronic diastolic CHF, right> left: Limited Echo with EF of 60 to 65%, no RWMA, biatrial enlargement, moderately reduced RVSF.  Hypotension and AKI limiting diuresis.  Diuresed well with IV Lasix and transitioned to p.o. torsemide. Net  -6.5 L so far. Cr 1.43> 1.26.  Weight down from 190 pounds to 182 pounds. -Discharged on torsemide 80 mg daily per advanced HF team -K. Dur 40 mEq daily -GDMT-metoprolol XL 25 mg daily -Monitor fluid status. Fluid restriction to 1500 cc a day.  Sodium restriction to less than 2 g a day. -Outpatient follow-up as above.  COPD exacerbation: Exacerbation resolved.  On room air.  Completed antibiotic and steroid course. -Continue home breathing treatments-Brovana, budesonide and as needed albuterol  Moderate right-sided pleural effusion: s/p thoracocentesis with removal of 800 cc culture negative transudate of fluid by protein -Diuretics as above.  A. fib with RVR: RVR resolved. -Continue metoprolol XL, Cardizem CD and Eliquis.  History of bioprosthetic AVR/MV repair: Stable  Large left-sided multinodular goiter with significant substernal component -Outpatient follow-up with endocrinology  CML/thrombocytosis: Recently started on Gleevec. -Resumed home Gleevec, 100 mg daily on 4/2 after discussion with her oncologist  Iron deficiency anemia/positive Hemoccult: Baseline Hgb 8-9>  8.5 (admit)>> 7.3>1u>8.3> 8.6.  Iron sat 1%.  She denies melena or hematochezia.  Hemoccult positive.  Anemia could be due to CML and/or GI bleed.  EGD and colonoscopy basically normal except for  some resected sessile polyps in 2019.  No strong indication for scoping per GI.  Patient and family not interested either. -IV Feraheme on 4/1.  -Increased p.o. ferrous sulfate to twice daily -Recheck CBC in 1 to 2 weeks.  Hypokalemia: Resolved.  Mild metabolic alkalosis: Likely due to diuretics.  Midline abdominal hernia: No incarceration. -Supportive care  Hypotension: Improved. -Per cardiology.  Debility/generalized weakness -Continue PT/OT at SNF.  DNR/DNI: Appropriate.   Discharge Instructions  Discharge Instructions    (HEART FAILURE PATIENTS) Call MD:  Anytime you have any of the following symptoms: 1) 3 pound weight gain in 24 hours or 5 pounds in 1 week 2) shortness of breath, with or without a dry hacking cough 3) swelling in the hands, feet or stomach 4) if you have to sleep on extra pillows at night in order to breathe.   Complete by: As directed    Call MD for:  difficulty breathing, headache or visual disturbances   Complete by: As directed    Call MD for:  extreme fatigue   Complete by: As directed    Call MD for:  persistant dizziness or light-headedness   Complete by: As directed    Diet - low sodium heart healthy   Complete by: As directed    Increase activity slowly   Complete by: As directed      Allergies as of 09/01/2019   No Known Allergies     Medication List    TAKE these medications   albuterol 108 (90 Base) MCG/ACT inhaler Commonly known as: VENTOLIN HFA Inhale 2 puffs into the lungs as needed.   ALPRAZolam 0.5 MG tablet Commonly known as: XANAX Take 1 tablet (0.5 mg total) by mouth 2 (two) times daily as needed for anxiety. What changed: when to take this   arformoterol 15 MCG/2ML Nebu Commonly known as: BROVANA Take 2 mLs (15 mcg total) by nebulization 2 (two) times daily. J 44.9   atorvastatin 40 MG tablet Commonly known as: LIPITOR TAKE 1 TABLET BY MOUTH EVERY DAY IN THE EVENING What changed:   how much to take  how  to take this  when to take this   budesonide 0.5 MG/2ML nebulizer solution Commonly known as: PULMICORT Take 2 mLs (0.5 mg total) by nebulization 2 (two) times daily. J 44.9   busPIRone 10 MG tablet Commonly known as: BUSPAR Take 1 tablet (10 mg total) by mouth 2 (two) times daily. What changed: when to take this   diltiazem 180 MG 24 hr capsule Commonly known as: CARDIZEM CD Take 1 capsule (180 mg total) by mouth daily.   docusate sodium 100 MG capsule Commonly known as: COLACE Take 2 capsules (200 mg total) by mouth at bedtime. What changed:   when to take this  reasons to take this   DULoxetine 20 MG capsule Commonly known as: CYMBALTA Take 1 capsule (20 mg total) by mouth daily.   Eliquis 5 MG Tabs tablet Generic drug: apixaban TAKE 1 TABLET BY MOUTH TWICE A DAY What changed: how much to take   ergocalciferol 1.25 MG (50000 UT) capsule Commonly known as: VITAMIN D2 Take 1 capsule (50,000 Units total) by mouth once a week.   feeding supplement (ENSURE ENLIVE) Liqd Take 237 mLs by mouth daily.   ferrous  sulfate 325 (65 FE) MG EC tablet Take 1 tablet (325 mg total) by mouth in the morning and at bedtime. What changed: when to take this   imatinib 100 MG tablet Commonly known as: GLEEVEC Take 1 tablet (100 mg total) by mouth daily. Take with meals and large glass of water.Caution:Chemotherapy   metoprolol succinate 25 MG 24 hr tablet Commonly known as: TOPROL-XL Take 1 tablet (25 mg total) by mouth daily. What changed: when to take this   multivitamin with minerals tablet Take 1 tablet by mouth daily.   pantoprazole 40 MG tablet Commonly known as: PROTONIX Take 1 tablet (40 mg total) by mouth daily at 6 (six) AM. Start taking on: September 02, 2019   polyethylene glycol 17 g packet Commonly known as: MIRALAX / GLYCOLAX Take 17 g by mouth daily as needed for mild constipation.   potassium chloride SA 20 MEQ tablet Commonly known as: Klor-Con M20 Take 2  tablets (40 mEq total) by mouth daily.   prochlorperazine 10 MG tablet Commonly known as: COMPAZINE Take 1 tablet (10 mg total) by mouth every 6 (six) hours as needed for nausea or vomiting.   torsemide 20 MG tablet Commonly known as: DEMADEX Take 4 tablets (80 mg total) by mouth daily. What changed: how much to take       Consultations:  Cardiology/advanced HF team  Gastroenterology  Procedures/Studies:  2D Echo on 08/27/2019 1. Left ventricular ejection fraction, by estimation, is 60 to 65%. The  left ventricle has normal function. The left ventricle has no regional  wall motion abnormalities.  2. Right ventricule is not well visualized but systolic function appears  moderately reduced and right ventricular size appears moderately enlarged.  3. The mitral valve has been repaired/replaced. Trivial mitral valve  regurgitation. There is a prosthetic annuloplasty ring present in the  mitral position. Mean gradient 3 mmHg.  4. The aortic valve has been repaired/replaced. Aortic valve  regurgitation is not visualized. Echo findings are consistent with normal  structure and function of the aortic valve prosthesis. Mean gradient 10  mmHg    DG Chest 1 View  Result Date: 08/28/2019 CLINICAL DATA:  Status post thoracentesis. EXAM: CHEST  1 VIEW COMPARISON:  August 27, 2019 FINDINGS: The right-sided PICC line is well position. The right-sided pleural effusion has decreased since the prior study. There remains a small right-sided effusion with adjacent airspace disease favored to represent atelectasis. The heart size is enlarged. The patient is status post prior median sternotomy and atrial appendage clip placement. There is a probable small left-sided pleural effusion. IMPRESSION: 1. No pneumothorax status post right-sided thoracentesis. 2. Significant interval decrease in size of the right-sided pleural effusion. 3. Cardiomegaly with persistent findings of vascular congestion. 4.  Persistent small left-sided pleural effusion. Electronically Signed   By: Constance Holster M.D.   On: 08/28/2019 16:23   CT CHEST WO CONTRAST  Result Date: 08/27/2019 CLINICAL DATA:  Hypoxemia EXAM: CT CHEST WITHOUT CONTRAST TECHNIQUE: Multidetector CT imaging of the chest was performed following the standard protocol without IV contrast. COMPARISON:  CT chest angiogram, 06/24/2019 FINDINGS: Cardiovascular: Aortic atherosclerosis. Status post aortic and mitral valve repair. Cardiomegaly. Scattered coronary artery calcifications. No pericardial effusion. Mediastinum/Nodes: No enlarged mediastinal, hilar, or axillary lymph nodes. Large left-sided multinodular goiter with significant substernal component. Trachea, and esophagus demonstrate no significant findings. Lungs/Pleura: Minimal centrilobular and paraseptal emphysema. Moderate right, small left pleural effusions with associated atelectasis or consolidation, effusions increased compared to prior CT dated 06/24/2019. No  pleural effusion or pneumothorax. Upper Abdomen: No acute abnormality. Large, partially imaged ventral midline abdominal hernia containing stomach and transverse colon. Musculoskeletal: No chest wall mass or suspicious bone lesions identified. IMPRESSION: 1. Moderate right, small left pleural effusions with associated atelectasis or consolidation, increased compared to prior CT dated 06/24/2019. 2. Minimal emphysema.  Emphysema (ICD10-J43.9). 3. Cardiomegaly and scattered coronary artery calcifications. 4. Aortic Atherosclerosis (ICD10-I70.0). 5. Large left-sided multinodular thyroid goiter with significant substernal component. 6. Large, partially imaged ventral midline abdominal hernia containing stomach and transverse colon. Electronically Signed   By: Eddie Candle M.D.   On: 08/27/2019 12:41   DG CHEST PORT 1 VIEW  Result Date: 08/27/2019 CLINICAL DATA:  Respiratory failure EXAM: PORTABLE CHEST 1 VIEW COMPARISON:  08/25/2019 FINDINGS:  Single frontal view of the chest demonstrates persistent enlargement the cardiac silhouette. Aortic valve prosthesis and postsurgical changes from median sternotomy again noted. Right-sided PICC tip overlies superior vena cava. Stable bibasilar consolidation and pleural effusions, right greater than left. No pneumothorax. No acute bony abnormalities. IMPRESSION: 1. Stable bilateral lower lobe consolidation and pleural effusions, right greater than left. 2. Stable cardiomegaly. Electronically Signed   By: Randa Ngo M.D.   On: 08/27/2019 20:57   DG CHEST PORT 1 VIEW  Result Date: 08/25/2019 CLINICAL DATA:  Hypoxia, baseline shortness of breath EXAM: PORTABLE CHEST 1 VIEW COMPARISON:  321 FINDINGS: Single frontal view of the chest demonstrates stable enlarged cardiac silhouette. Atherosclerosis aortic arch unchanged. Continued right basilar consolidation and effusion. Chronic central vascular congestion. No pneumothorax. IMPRESSION: 1. Stable right basilar consolidation and pleural effusion. 2. Stable central vascular congestion. Electronically Signed   By: Randa Ngo M.D.   On: 08/25/2019 17:04   DG CHEST PORT 1 VIEW  Result Date: 08/06/2019 CLINICAL DATA:  Followup pleural effusion EXAM: PORTABLE CHEST 1 VIEW COMPARISON:  08/04/2019 and older exams FINDINGS: Moderate right pleural effusion obscures the right hemidiaphragm without significant change from the previous exam. Mild vascular interstitial prominence is stable. No new lung abnormalities. Changes from previous cardiac surgery are stable. Cardiac silhouette is enlarged. IMPRESSION: 1. No significant change from the most recent prior study. 2. Moderate right pleural effusion. Vascular congestion and mild interstitial prominence without overt pulmonary edema. Electronically Signed   By: Lajean Manes M.D.   On: 08/06/2019 05:18   DG CHEST PORT 1 VIEW  Result Date: 08/04/2019 CLINICAL DATA:  Shortness of breath. EXAM: PORTABLE CHEST 1 VIEW  COMPARISON:  08/02/2019 FINDINGS: Stable enlarged cardiac silhouette, prosthetic aortic and mitral valves, mediastinal surgical clips and left atrial appendage clip. Moderate-sized right pleural effusion with a mild increase in amount. Mild increase in associated right basilar atelectasis or pneumonia. Decreased prominence of the pulmonary vasculature and interstitial markings. Diffuse osteopenia. IMPRESSION: 1. Mild increase in size of the moderate-sized right pleural effusion and right basilar atelectasis or pneumonia. 2. Stable cardiomegaly with improved pulmonary vascular congestion and interstitial pulmonary edema. Electronically Signed   By: Claudie Revering M.D.   On: 08/04/2019 11:34   ECHOCARDIOGRAM COMPLETE  Result Date: 08/02/2019    ECHOCARDIOGRAM REPORT   Patient Name:   Nicole [name] Date of Exam: 08/02/2019 Medical Rec #:  [id number]   Height:       63.0 in Accession #:    [id number]  Weight:       191.8 lb Date of Birth:  24-May-1936   BSA:          1.900 m Patient Age:    16 years  BP:           110/84 mmHg Patient Gender: F           HR:           95 bpm. Exam Location:  [facility] Procedure: 2D Echo, Cardiac Doppler and Color Doppler Indications:    Dyspnea 786.09 / R06.00  History:        Patient has prior history of Echocardiogram examinations, most                 recent 06/25/2019. CHF, CAD, COPD, Aortic Valve Disease and                 Mitral Valve Disease, Arrythmias:Atrial Fibrillation; Risk                 Factors:Dyslipidemia. Acute respiratory failure with hypoxia,                 s/p tissue AVR in 2014 with MV ring and maze procedure.  Sonographer:    Alvino Chapel RCS Referring Phys: [id number] [place]  1. Left ventricular ejection fraction, by estimation, is 60 to 65%. The left ventricle has normal function. The left ventricle has no regional wall motion abnormalities. There is mild There is basal septal hypertrophy left ventricular hypertrophy. Left ventricular  diastolic parameters are indeterminate.  2. Right ventricular systolic function is low normal. The right ventricular size is mildly enlarged. There is moderately elevated pulmonary artery systolic pressure.  3. Left atrial size was severely dilated.  4. Right atrial size was severely dilated.  5. 26 mm Sorin 3-D Memo Ring is in the MV anular position. Mild mean gradient across the repaired valve of 6 mmHg. . The mitral valve has been repaired/replaced. No evidence of mitral valve regurgitation.  6. 21 mm Edwards Magna-ease pericardial valve is in the AV position. Average mean gradient is measured at 24 mmHg (within range of normal values for valve) The anatomy of the AV is poorly visualized. . The aortic valve has been repaired/replaced. Aortic  valve regurgitation is not visualized. FINDINGS  Left Ventricle: Left ventricular ejection fraction, by estimation, is 60 to 65%. The left ventricle has normal function. The left ventricle has no regional wall motion abnormalities. The left ventricular internal cavity size was normal in size. There is  mild There is basal septal hypertrophy left ventricular hypertrophy. Left ventricular diastolic parameters are indeterminate. Right Ventricle: The right ventricular size is mildly enlarged. Right vetricular wall thickness was not assessed. Right ventricular systolic function is low normal. There is moderately elevated pulmonary artery systolic pressure. The tricuspid regurgitant velocity is 3.09 m/s, and with an assumed right atrial pressure of 10 mmHg, the estimated right ventricular systolic pressure is 99991111 mmHg. Left Atrium: Left atrial size was severely dilated. Right Atrium: Right atrial size was severely dilated. Pericardium: There is no evidence of pericardial effusion. Mitral Valve: 26 mm Sorin 3-D Memo Ring is in the MV anular position. Mild mean gradient across the repaired valve of 6 mmHg. The mitral valve has been repaired/replaced. No evidence of mitral valve  regurgitation. MV peak gradient, 25.5 mmHg. The mean mitral valve gradient is 7.5 mmHg. Tricuspid Valve: The tricuspid valve is normal in structure. Tricuspid valve regurgitation is mild. Aortic Valve: 21 mm Edwards Magna-ease pericardial valve is in the AV position. Average mean gradient is measured at 24 mmHg (within range of normal values for valve) The anatomy of the AV is poorly visualized. The aortic  valve has been repaired/replaced. Aortic valve regurgitation is not visualized. Aortic valve mean gradient measures 24.3 mmHg. Aortic valve peak gradient measures 42.3 mmHg. Aortic valve area, by VTI measures 1.01 cm. Pulmonic Valve: The pulmonic valve was not well visualized. Pulmonic valve regurgitation is not visualized. No evidence of pulmonic stenosis. Aorta: The aortic root is normal in size and structure. IAS/Shunts: No atrial level shunt detected by color flow Doppler.  LEFT VENTRICLE PLAX 2D LVIDd:         3.83 cm LVIDs:         2.36 cm LV PW:         1.05 cm LV IVS:        1.24 cm LVOT diam:     1.80 cm LV SV:         64 LV SV Index:   34 LVOT Area:     2.54 cm  LEFT ATRIUM             Index       RIGHT ATRIUM           Index LA diam:        4.50 cm 2.37 cm/m  RA Area:     30.05 cm LA Vol (A2C):   83.5 ml 43.96 ml/m RA Volume:   113.65 ml 59.83 ml/m LA Vol (A4C):   85.7 ml 45.12 ml/m LA Biplane Vol: 89.7 ml 47.22 ml/m  AORTIC VALVE AV Area (Vmax):    0.94 cm AV Area (Vmean):   0.98 cm AV Area (VTI):     1.01 cm AV Vmax:           325.28 cm/s AV Vmean:          229.761 cm/s AV VTI:            0.630 m AV Peak Grad:      42.3 mmHg AV Mean Grad:      24.3 mmHg LVOT Vmax:         120.50 cm/s LVOT Vmean:        88.450 cm/s LVOT VTI:          0.250 m LVOT/AV VTI ratio: 0.40  AORTA Ao Root diam: 2.90 cm MITRAL VALVE                TRICUSPID VALVE MV Area (PHT): 2.11 cm     TR Peak grad:   38.2 mmHg MV Peak grad:  25.5 mmHg    TR Vmax:        309.00 cm/s MV Mean grad:  7.5 mmHg MV Vmax:       2.53 m/s      SHUNTS MV Vmean:      112.7 cm/s   Systemic VTI:  0.25 m MV Decel Time: 359 msec     Systemic Diam: 1.80 cm MV E velocity: 211.00 cm/s Carlyle Dolly MD Electronically signed by Carlyle Dolly MD Signature Date/Time: 08/02/2019/4:00:55 PM    Final    ECHOCARDIOGRAM LIMITED  Result Date: 08/27/2019    ECHOCARDIOGRAM LIMITED REPORT   Patient Name:   Juanna Tunks Date of Exam: 08/27/2019 Medical Rec #:  [id number]   Height:       63.0 in Accession #:    [id number]  Weight:       196.4 lb Date of Birth:  [birth year]   BSA:          1.919 m Patient Age:    84 years    BP:  92/58 mmHg Patient Gender: F           HR:           78 bpm. Exam Location:  Inpatient Procedure: Limited Echo Indications:    428.21 CHF  History:        Patient has prior history of Echocardiogram examinations, most                 recent 08/02/2019. CHF, COPD, Arrythmias:Atrial Fibrillation; Risk                 Factors:Dyslipidemia. Valvular heart disease.  Sonographer:    Jannett Celestine RDCS (AE) Referring Phys: [id number] AVA SWAYZE  Sonographer Comments: No subcostal window. no subcostal due to hernia IMPRESSIONS  1. Left ventricular ejection fraction, by estimation, is 60 to 65%. The left ventricle has normal function. The left ventricle has no regional wall motion abnormalities.  2. Right ventricule is not well visualized but systolic function appears moderately reduced and right ventricular size appears moderately enlarged.  3. The mitral valve has been repaired/replaced. Trivial mitral valve regurgitation. There is a prosthetic annuloplasty ring present in the mitral position. Mean gradient 3 mmHg.  4. The aortic valve has been repaired/replaced. Aortic valve regurgitation is not visualized. Echo findings are consistent with normal structure and function of the aortic valve prosthesis. Mean gradient 10 mmHg FINDINGS  Left Ventricle: Left ventricular ejection fraction, by estimation, is 60 to 65%. The left ventricle has normal function.  The left ventricle has no regional wall motion abnormalities. Right Ventricle: The right ventricular size is moderately enlarged. Right ventricular systolic function is moderately reduced. Pericardium: Trivial pericardial effusion is present. Presence of pericardial fat pad. Mitral Valve: The mitral valve has been repaired/replaced. Trivial mitral valve regurgitation. There is a prosthetic annuloplasty ring present in the mitral position. Tricuspid Valve: The tricuspid valve is normal in structure. Tricuspid valve regurgitation is trivial. Aortic Valve: The aortic valve has been repaired/replaced. Aortic valve regurgitation is not visualized. There is a 21 mm Edwards Magna-ease pericardial valve valve present in the aortic position. Echo findings are consistent with normal structure and function of the aortic valve prosthesis. Pulmonic Valve: The pulmonic valve was not well visualized. Pulmonic valve regurgitation is not visualized. Oswaldo Milian MD Electronically signed by Oswaldo Milian MD Signature Date/Time: 08/27/2019/11:08:11 PM    Final    [place] EKG SITE RITE  Result Date: 08/28/2019 If Site Rite image not attached, placement could not be confirmed due to current cardiac rhythm.  [place] EKG SITE RITE  Result Date: 08/27/2019 If Site Rite image not attached, placement could not be confirmed due to current cardiac rhythm.  IR THORACENTESIS ASP PLEURAL SPACE W/IMG GUIDE  Result Date: 08/28/2019 INDICATION: Patient with history of heart failure, COPD, dyspnea, and bilateral pleural effusions, right > left. Request is made for diagnostic and therapeutic right thoracentesis. EXAM: ULTRASOUND GUIDED DIAGNOSTIC AND THERAPEUTIC RIGHT THORACENTESIS MEDICATIONS: 10 mL 1% lidocaine COMPLICATIONS: None immediate. PROCEDURE: An ultrasound guided thoracentesis was thoroughly discussed with the patient and questions answered. The benefits, risks, alternatives and complications were also discussed. The  patient understands and wishes to proceed with the procedure. Written consent was obtained. Ultrasound was performed to localize and mark an adequate pocket of fluid in the right chest. The area was then prepped and draped in the normal sterile fashion. 1% Lidocaine was used for local anesthesia. Under ultrasound guidance a 6 Fr Safe-T-Centesis catheter was introduced. Thoracentesis was performed. The catheter was removed and a  dressing applied. FINDINGS: A total of approximately 800 mL of hazy amber fluid was removed. Samples were sent to the laboratory as requested by the clinical team. IMPRESSION: Successful ultrasound guided right thoracentesis yielding 800 mL of pleural fluid. Read by: Earley Abide, PA-C Electronically Signed   By: Jacqulynn Cadet M.D.   On: 08/28/2019 16:26      Discharge Exam: Vitals:   09/01/19 0806 09/01/19 0824  BP: 116/71   Pulse: 89   Resp: (!) 22   Temp: 97.8 F (36.6 C)   SpO2: 91% 92%    GENERAL: No acute distress.  Appears well.  HEENT: MMM.  Vision and hearing grossly intact.  NECK: Supple.  No apparent JVD.  RESP: On room air.  No IWOB.  Fair aeration bilaterally. CVS:  RRR. Heart sounds normal.  ABD/GI/GU: Bowel sounds present. Soft. Non tender.  MSK/EXT:  Moves extremities. No apparent deformity.  Trace edema. SKIN: no apparent skin lesion or wound NEURO: Awake, alert and oriented appropriately.  No apparent focal neuro deficit. PSYCH: Calm. Normal affect.    The results of significant diagnostics from this hospitalization (including imaging, microbiology, ancillary and laboratory) are listed below for reference.     Microbiology: Recent Results (from the past 240 hour(s))  Culture, blood (routine x 2)     Status: None   Collection Time: 08/26/19  2:32 PM   Specimen: BLOOD LEFT HAND  Result Value Ref Range Status   Specimen Description BLOOD LEFT HAND  Final   Special Requests   Final    BOTTLES DRAWN AEROBIC ONLY Blood Culture results  may not be optimal due to an inadequate volume of blood received in culture bottles   Culture   Final    NO GROWTH 5 DAYS Performed at [facility], [place] [address], [place], [place] [address]    Report Status 08/31/2019 FINAL  Final  Culture, blood (routine x 2)     Status: None   Collection Time: 08/26/19  2:32 PM   Specimen: BLOOD LEFT WRIST  Result Value Ref Range Status   Specimen Description BLOOD LEFT WRIST  Final   Special Requests   Final    BOTTLES DRAWN AEROBIC ONLY Blood Culture adequate volume   Culture   Final    NO GROWTH 5 DAYS Performed at [facility], [place] [address] [place], [place] [address]    Report Status 08/31/2019 FINAL  Final  SARS CORONAVIRUS 2 (TAT 6-24 HRS) Nasopharyngeal Nasopharyngeal Swab     Status: None   Collection Time: 08/28/19 10:01 AM   Specimen: Nasopharyngeal Swab  Result Value Ref Range Status   SARS Coronavirus 2 NEGATIVE NEGATIVE Final    Comment: (NOTE) SARS-CoV-2 target nucleic acids are NOT DETECTED. The SARS-CoV-2 RNA is generally detectable in upper and lower respiratory specimens during the acute phase of infection. Negative results do not preclude SARS-CoV-2 infection, do not rule out co-infections with other pathogens, and should not be used as the sole basis for treatment or other patient management decisions. Negative results must be combined with clinical observations, patient history, and epidemiological information. The expected result is Negative. Fact Sheet for Patients: SugarRoll.be Fact Sheet for Healthcare Providers: https://www.woods-mathews.com/ This test is not yet approved or cleared by the [place] FDA and  has been authorized for detection and/or diagnosis of SARS-CoV-2 by FDA under an Emergency Use Authorization (EUA). This EUA will remain  in effect (meaning this test can be used) for the duration of  the COVID-19 declaration under Section 56 4(b)(1)  of the Act, 21 U.S.C. section 360bbb-3(b)(1), unless the authorization is terminated or revoked sooner. Performed at [facility], [place] [address] [place], [place] [address]   Gram stain     Status: None   Collection Time: 08/28/19  4:06 PM   Specimen: Pleura  Result Value Ref Range Status   Specimen Description PLEURAL FLUID RIGHT LUNG  Final   Special Requests NONE  Final   Gram Stain   Final    WBC PRESENT, PREDOMINANTLY MONONUCLEAR NO ORGANISMS SEEN CYTOSPIN SMEAR Performed at [facility], [place] [address], [place], [place] [address]    Report Status 08/28/2019 FINAL  Final  Culture, body fluid-bottle     Status: None (Preliminary result)   Collection Time: 08/28/19  4:06 PM   Specimen: Pleura  Result Value Ref Range Status   Specimen Description PLEURAL FLUID RIGHT LUNG  Final   Special Requests NONE  Final   Culture   Final    NO GROWTH 3 DAYS Performed at [place] [address] [place], [place] [address]    Report Status PENDING  Incomplete     Labs: BNP (last 3 results) Recent Labs    07/30/19 0454 08/01/19 0528 08/28/19 0545  BNP 822.0* 737.0* 123456*   Basic Metabolic Panel: Recent Labs  Lab 08/29/19 0649 08/30/19 0500 08/31/19 0500 08/31/19 1634 09/01/19 0434  NA 136 139 135 139 139  K 4.1 3.2* 2.7* 3.3* 4.5  CL 99 94* 90* 91* 95*  CO2 29 31 33* 37* 35*  GLUCOSE 103* 166* 106* 176* 122*  BUN 47* 43* 30* 27* 26*  CREATININE 1.28* 1.43* 1.30* 1.34* 1.26*  CALCIUM 8.5* 8.8* 9.1 9.3 9.5  MG 2.7* 2.4 2.3  --  2.3  PHOS  --  4.8* 3.4  --  2.9   Liver Function Tests: Recent Labs  Lab 08/28/19 2000 08/30/19 0500 08/31/19 0500 09/01/19 0434  PROT 5.4*  --   --   --   ALBUMIN 3.1* 2.9* 2.9* 3.1*   No results for input(s): LIPASE, AMYLASE in the last 168 hours. No results for input(s): AMMONIA in the last 168 hours. CBC: Recent Labs  Lab 08/25/19 1555 08/25/19 1555 08/26/19 0218 08/26/19 0218 08/28/19 0545  08/28/19 2000 08/30/19 0500 08/31/19 0500 09/01/19 0435  WBC 13.6*   < > 17.1*  --  13.3* 14.5*  --  11.1* 16.8*  NEUTROABS 12.0*  --  15.4*  --   --   --   --   --   --   HGB 8.5*   < > 8.0*   < > 7.2* 7.7* 7.3* 8.3* 8.6*  HCT 28.8*   < > 27.3*   < > 24.6* 26.0* 24.5* 27.0* 28.9*  MCV 85.5   < > 87.2  --  87.5 86.1  --  85.2 86.0  PLT 1,633*   < > 1,393*  --  1,516* 1,594*  --  1,152* 1,513*   < > = values in this interval not displayed.   Cardiac Enzymes: No results for input(s): CKTOTAL, CKMB, CKMBINDEX, TROPONINI in the last 168 hours. BNP: Invalid input(s): POCBNP CBG: No results for input(s): GLUCAP in the last 168 hours. D-Dimer No results for input(s): DDIMER in the last 72 hours. Hgb A1c No results for input(s): HGBA1C in the last 72 hours. Lipid Profile No results for input(s): CHOL, HDL, LDLCALC, TRIG, CHOLHDL, LDLDIRECT in the last 72  hours. Thyroid function studies No results for input(s): TSH, T4TOTAL, T3FREE, THYROIDAB in the last 72 hours.  Invalid input(s): FREET3 Anemia work up Recent Labs    08/29/19 1321  VITAMINB12 407  FOLATE 15.5  FERRITIN 20  TIBC 479*  IRON 7*  RETICCTPCT 3.4*   Urinalysis    Component Value Date/Time   COLORURINE YELLOW 08/27/2019 0500   APPEARANCEUR CLEAR 08/27/2019 0500   LABSPEC 1.015 08/27/2019 0500   PHURINE 5.0 08/27/2019 0500   GLUCOSEU NEGATIVE 08/27/2019 0500   HGBUR NEGATIVE 08/27/2019 0500   BILIRUBINUR NEGATIVE 08/27/2019 0500   KETONESUR NEGATIVE 08/27/2019 0500   PROTEINUR NEGATIVE 08/27/2019 0500   UROBILINOGEN 1.0 03/04/2013 1230   NITRITE NEGATIVE 08/27/2019 0500   LEUKOCYTESUR NEGATIVE 08/27/2019 0500   Sepsis Labs Invalid input(s): PROCALCITONIN,  WBC,  LACTICIDVEN   Time coordinating discharge: 45 minutes  SIGNED:  Mercy Riding, MD  [facility] 09/01/2019, 9:34 AM  If 7PM-7AM, please contact night-coverage [email or web address] Password [email or web address]

## 2019-09-02 LAB — CULTURE, BODY FLUID W GRAM STAIN -BOTTLE: Culture: NO GROWTH

## 2019-09-03 DIAGNOSIS — I5033 Acute on chronic diastolic (congestive) heart failure: Secondary | ICD-10-CM | POA: Diagnosis not present

## 2019-09-03 DIAGNOSIS — I4891 Unspecified atrial fibrillation: Secondary | ICD-10-CM | POA: Diagnosis not present

## 2019-09-03 DIAGNOSIS — J449 Chronic obstructive pulmonary disease, unspecified: Secondary | ICD-10-CM | POA: Diagnosis not present

## 2019-09-03 DIAGNOSIS — I5022 Chronic systolic (congestive) heart failure: Secondary | ICD-10-CM | POA: Diagnosis not present

## 2019-09-03 DIAGNOSIS — R5381 Other malaise: Secondary | ICD-10-CM | POA: Diagnosis not present

## 2019-09-04 ENCOUNTER — Encounter: Payer: Self-pay | Admitting: Student

## 2019-09-04 ENCOUNTER — Telehealth (INDEPENDENT_AMBULATORY_CARE_PROVIDER_SITE_OTHER): Payer: Medicare Other | Admitting: Student

## 2019-09-04 VITALS — BP 92/55 | HR 94 | Temp 98.2°F | Resp 20 | Wt 172.2 lb

## 2019-09-04 DIAGNOSIS — N183 Chronic kidney disease, stage 3 unspecified: Secondary | ICD-10-CM

## 2019-09-04 DIAGNOSIS — I5032 Chronic diastolic (congestive) heart failure: Secondary | ICD-10-CM

## 2019-09-04 DIAGNOSIS — I38 Endocarditis, valve unspecified: Secondary | ICD-10-CM

## 2019-09-04 DIAGNOSIS — Z79899 Other long term (current) drug therapy: Secondary | ICD-10-CM

## 2019-09-04 DIAGNOSIS — C9210 Chronic myeloid leukemia, BCR/ABL-positive, not having achieved remission: Secondary | ICD-10-CM

## 2019-09-04 DIAGNOSIS — I4821 Permanent atrial fibrillation: Secondary | ICD-10-CM

## 2019-09-04 NOTE — Patient Instructions (Signed)
Medication Instructions:  Your physician recommends that you continue on your current medications as directed. Please refer to the Current Medication list given to you today.  *If you need a refill on your cardiac medications before your next appointment, please call your pharmacy*   Lab Work: BMET in 1 week  If you have labs (blood work) drawn today and your tests are completely normal, you will receive your results only by: Marland Kitchen MyChart Message (if you have MyChart) OR . A paper copy in the mail If you have any lab test that is abnormal or we need to change your treatment, we will call you to review the results.   Testing/Procedures:  BMET in 1 week (09/11/19)   Follow-Up: At [facility], you and your health needs are our priority.  As part of our continuing mission to provide you with exceptional heart care, we have created designated Provider Care Teams.  These Care Teams include your primary Cardiologist (physician) and Advanced Practice Providers (APPs -  Physician Assistants and Nurse Practitioners) who all work together to provide you with the care you need, when you need it.  We recommend signing up for the patient portal called "MyChart".  Sign up information is provided on this After Visit Summary.  MyChart is used to connect with patients for Virtual Visits (Telemedicine).  Patients are able to view lab/test results, encounter notes, upcoming appointments, etc.  Non-urgent messages can be sent to your provider as well.   To learn more about what you can do with MyChart, go to [email or web address].    Your next appointment:  Keep apt with Dr.McLean on 09/11/19       Thank you for choosing [facility] !

## 2019-09-04 NOTE — Progress Notes (Signed)
Virtual Visit via Video Note   This visit type was conducted due to national recommendations for restrictions regarding the COVID-19 Pandemic (e.g. social distancing) in an effort to limit this patient's exposure and mitigate transmission in our community.  Due to her co-morbid illnesses, this patient is at least at moderate risk for complications without adequate follow up.  This format is felt to be most appropriate for this patient at this time.  All issues noted in this document were discussed and addressed.  A limited physical exam was performed with this format.  Please refer to the patient's chart for her consent to telehealth for [facility].   The patient was identified using 2 identifiers.  Date:  09/04/2019   ID:  Nicole [name], DOB 03/20/1936, MRN [id number]  Patient Location: [place] Provider Location: Office  PCP:  Frances Maywood, FNP  Cardiologist:  [name], MD   Evaluation Performed:  Follow-Up Visit  Chief Complaint:  Hospital Follow-up  History of Present Illness:    Nicole [name] is a 84 y.o. female with past medical history of permanent atrial fibrillation (failed prior DCCV, developed hyperthyroidism and junctional rhythm with Amiodarone in the past --> rate-control strategy pursued), valvular heart disease (s/p tissue AVR in 2014 with MV ring and maze procedure), chronic diastolic CHF, COPD, hyperthyroidism, HLD and CML with thrombocytosis who presents for a follow-up telehealth visit.   She was admitted to [facility] from 2/26 - 08/09/2019 for acute hypoxic respiratory failure in the setting of HCAP and an acute CHF exacerbation. Cardiology was consulted in regards to her CHF and atrial fibrillation. Repeat echo showed a preserved EF with low-normal RV function. She diuresed well with IV Lasix and weight declined to 188 lbs with her being transitioned to Torsemide 60mg  daily on discharge. She was placed on Cardizem CD 180mg  daily and Toprol-XL  25mg  daily for rate-control.   She presented back to [facility] on 08/25/2019 for evaluation of worsening dyspnea and lower extremity edema. Was in atrial fibrillation with RVR on admission and found to have an AKI (creatinine pealed at 1.65, improved to 1.26 on discharge). AHF was consulted and PICC was placed. She underwent a right thoracentesis on 3/30 with 800 mL of fluid removed. It was thought [place] might be contributing to her fluid accumulation but this was reviewed with her Oncologist and felt to be unlikely given she was on a low dose of the medication. GI was consulted given her anemia and positive FOBT. Hgb did stabilize (8.6 on 4/3) and further GI testing was not pursued and she was not inclined to pursue aggressive evaluation. Weight has declined to 182 lbs on discharge and she was transitioned to Torsemide 80mg  daily.   In talking with the patient today, she reports overall doing well at SNF since returning there 3 days ago. She reports her breathing has been at baseline and she denies any recurrent orthopnea, PND or lower extremity edema. No recent chest pain or palpitations. She has been working with PT and OT on a daily basis and is hopeful to return home in the next few weeks.  Her weight has declined over the past few days and is now down to 172 lbs. Reports a stable appetite. BP was soft at 92/55 this AM but SBP has typically been in the low-100's to 110's and this was an outlier. She denies any associated dizziness or presyncope.   The patient does not have symptoms concerning for COVID-19 infection (fever, chills,  cough, or new shortness of breath).    Past Medical History:  Diagnosis Date  . Anxiety   . Atrial fibrillation ([place])   . COPD (chronic obstructive pulmonary disease) ([place])   . Diastolic CHF ([place])   . Hyperlipidemia   . SOB (shortness of breath)    conplicated by underlying a fib with RVR,COPD, and CHF  . Valvular heart disease    a.s/p tissue AVR in 2014 with  MV ring and maze procedure   Past Surgical History:  Procedure Laterality Date  . ABDOMINAL HYSTERECTOMY    . AORTIC VALVE REPLACEMENT N/A 03/05/2013   Procedure: AORTIC VALVE REPLACEMENT (AVR);  Surgeon: Gaye Pollack, MD;  Location: [facility];  Service: Open Heart Surgery;  Laterality: N/A;  . BREAST BIOPSY     x3  . CARDIOVERSION N/A 02/13/2013   Procedure: CARDIOVERSION;  Surgeon: [name], MD;  Location: [facility] ENDOSCOPY;  Service: Cardiovascular;  Laterality: N/A;  . CARDIOVERSION N/A 03/01/2013   Procedure: CARDIOVERSION;  Surgeon: [name], MD;  Location[facility] ENDOSCOPY;  Service: Cardiovascular;  Laterality: N/A;  . CARDIOVERSION N/A 06/23/2016   Procedure: CARDIOVERSION;  Surgeon: [name], MD;  Location: [facility];  Service: Cardiovascular;  Laterality: N/A;  . CATARACT EXTRACTION    . COLONOSCOPY WITH PROPOFOL N/A 06/24/2017   Procedure: COLONOSCOPY WITH PROPOFOL;  Surgeon: Carol Ada, MD;  Location: [facility];  Service: Endoscopy;  Laterality: N/A;  . ESOPHAGOGASTRODUODENOSCOPY N/A 06/24/2017   Procedure: ESOPHAGOGASTRODUODENOSCOPY (EGD);  Surgeon: Carol Ada, MD;  Location: [facility];  Service: Endoscopy;  Laterality: N/A;  . INTRAOPERATIVE TRANSESOPHAGEAL ECHOCARDIOGRAM N/A 03/05/2013   Procedure: INTRAOPERATIVE TRANSESOPHAGEAL ECHOCARDIOGRAM;  Surgeon: Gaye Pollack, MD;  Location: [facility];  Service: Open Heart Surgery;  Laterality: N/A;  . IR THORACENTESIS ASP PLEURAL SPACE W/IMG GUIDE  08/28/2019  . LEFT AND RIGHT HEART CATHETERIZATION WITH CORONARY ANGIOGRAM N/A 03/02/2013   Procedure: LEFT AND RIGHT HEART CATHETERIZATION WITH CORONARY ANGIOGRAM;  Surgeon: [name], MD;  Location[facility] C[facility] LAB;  Service: Cardiovascular;  Laterality: N/A;  . MAZE N/A 03/05/2013   Procedure: MAZE;  Surgeon: Gaye Pollack, MD;  Location: [facility];  Service: Open Heart Surgery;  Laterality: N/A;  . MITRAL VALVE REPAIR N/A 03/05/2013   Procedure: MITRAL VALVE REPAIR (MVR);   Surgeon: Gaye Pollack, MD;  Location: [facility];  Service: Open Heart Surgery;  Laterality: N/A;  . ROTATOR CUFF REPAIR    . TEE WITHOUT CARDIOVERSION N/A 03/01/2013   Procedure: TRANSESOPHAGEAL ECHOCARDIOGRAM (TEE);  Surgeon: [name], MD;  Location: [facility];  Service: Cardiovascular;  Laterality: N/A;  talked to bev. booking no. is [id number] called trish to verify time  . TONSILLECTOMY       Current Meds  Medication Sig  . albuterol (VENTOLIN HFA) 108 (90 Base) MCG/ACT inhaler Inhale 2 puffs into the lungs as needed.   . ALPRAZolam (XANAX) 0.5 MG tablet Take 1 tablet (0.5 mg total) by mouth 2 (two) times daily as needed for anxiety.  Marland Kitchen arformoterol (BROVANA) 15 MCG/2ML NEBU Take 2 mLs (15 mcg total) by nebulization 2 (two) times daily. J 44.9  . atorvastatin (LIPITOR) 40 MG tablet TAKE 1 TABLET BY MOUTH EVERY DAY IN THE EVENING (Patient taking differently: Take 40 mg by mouth daily at 6 PM. TAKE 1 TABLET BY MOUTH EVERY DAY IN THE EVENING)  . budesonide (PULMICORT) 0.5 MG/2ML nebulizer solution Take 2 mLs (0.5 mg total) by nebulization 2 (two) times daily. J  44.9  . busPIRone (BUSPAR) 10 MG tablet Take 1 tablet (10 mg total) by mouth 2 (two) times daily. (Patient taking differently: Take 10 mg by mouth daily. )  . diltiazem (CARDIZEM CD) 180 MG 24 hr capsule Take 1 capsule (180 mg total) by mouth daily.  Marland Kitchen docusate sodium (COLACE) 100 MG capsule Take 2 capsules (200 mg total) by mouth at bedtime. (Patient taking differently: Take 200 mg by mouth at bedtime as needed for mild constipation. )  . DULoxetine (CYMBALTA) 20 MG capsule Take 1 capsule (20 mg total) by mouth daily.  Marland Kitchen ELIQUIS 5 MG TABS tablet TAKE 1 TABLET BY MOUTH TWICE A DAY (Patient taking differently: Take 5 mg by mouth 2 (two) times daily. )  . ergocalciferol (VITAMIN D2) 1.25 MG (50000 UT) capsule Take 1 capsule (50,000 Units total) by mouth once a week.  . ferrous sulfate 325 (65 FE) MG EC tablet Take 1 tablet (325 mg  total) by mouth in the morning and at bedtime.  Marland Kitchen imatinib (GLEEVEC) 100 MG tablet Take 1 tablet (100 mg total) by mouth daily. Take with meals and large glass of water.Caution:Chemotherapy  . metoprolol succinate (TOPROL-XL) 25 MG 24 hr tablet Take 1 tablet (25 mg total) by mouth daily.  . Multiple Vitamins-Minerals (MULTIVITAMIN WITH MINERALS) tablet Take 1 tablet by mouth daily.  . pantoprazole (PROTONIX) 40 MG tablet Take 1 tablet (40 mg total) by mouth daily at 6 (six) AM.  . polyethylene glycol (MIRALAX / GLYCOLAX) 17 g packet Take 17 g by mouth daily as needed for mild constipation.  . potassium chloride SA (KLOR-CON M20) 20 MEQ tablet Take 2 tablets (40 mEq total) by mouth daily.  . prochlorperazine (COMPAZINE) 10 MG tablet Take 1 tablet (10 mg total) by mouth every 6 (six) hours as needed for nausea or vomiting.  . torsemide (DEMADEX) 20 MG tablet Take 4 tablets (80 mg total) by mouth daily.     Allergies:   Patient has no known allergies.   Social History   Tobacco Use  . Smoking status: Former Smoker    Packs/day: 1.00    Years: 57.00    Pack years: 57.00    Types: Cigarettes    Start date: 06/12/1955    Quit date: 11/28/2012    Years since quitting: 6.7  . Smokeless tobacco: Never Used  . Tobacco comment: Quit in July 2014  Substance Use Topics  . Alcohol use: No    Alcohol/week: 0.0 standard drinks  . Drug use: No     Family Hx: The patient's family history includes Breast cancer in her daughter and another family member; Heart attack in her son; Heart disease in her brother and mother; Throat cancer in her father.  ROS:   Please see the history of present illness.     All other systems reviewed and are negative.   Prior CV studies:   The following studies were reviewed today:  Echocardiogram: 08/02/2019 IMPRESSIONS    1. Left ventricular ejection fraction, by estimation, is 60 to 65%. The  left ventricle has normal function. The left ventricle has no  regional  wall motion abnormalities. There is mild There is basal septal hypertrophy  left ventricular hypertrophy. Left  ventricular diastolic parameters are indeterminate.  2. Right ventricular systolic function is low normal. The right  ventricular size is mildly enlarged. There is moderately elevated  pulmonary artery systolic pressure.  3. Left atrial size was severely dilated.  4. Right atrial size was severely  dilated.  5. 26 mm Sorin 3-D Memo Ring is in the MV anular position. Mild mean  gradient across the repaired valve of 6 mmHg. . The mitral valve has been  repaired/replaced. No evidence of mitral valve regurgitation.  6. 21 mm Edwards Magna-ease pericardial valve is in the AV position.  Average mean gradient is measured at 24 mmHg (within range of normal  values for valve) The anatomy of the AV is poorly visualized. . The aortic  valve has been repaired/replaced. Aortic  valve regurgitation is not visualized.   Limited Echo: 08/27/2019 IMPRESSIONS    1. Left ventricular ejection fraction, by estimation, is 60 to 65%. The  left ventricle has normal function. The left ventricle has no regional  wall motion abnormalities.  2. Right ventricule is not well visualized but systolic function appears  moderately reduced and right ventricular size appears moderately enlarged.  3. The mitral valve has been repaired/replaced. Trivial mitral valve  regurgitation. There is a prosthetic annuloplasty ring present in the  mitral position. Mean gradient 3 mmHg.  4. The aortic valve has been repaired/replaced. Aortic valve  regurgitation is not visualized. Echo findings are consistent with normal  structure and function of the aortic valve prosthesis. Mean gradient 10  mmHg   Labs/Other Tests and Data Reviewed:    EKG:  An ECG dated 08/25/2019 was personally reviewed today and demonstrated:  atrial fibrillation, HR 103 with PVC's.   Recent Labs: 07/27/2019: ALT 27 07/30/2019:  TSH 0.165 08/28/2019: B Natriuretic Peptide 517.7 09/01/2019: BUN 26; Creatinine, Ser 1.26; Hemoglobin 8.6; Magnesium 2.3; Platelets 1,513; Potassium 4.5; Sodium 139   Recent Lipid Panel Lab Results  Component Value Date/Time   CHOL 122 06/04/2019 02:07 PM   TRIG 162 (H) 06/04/2019 02:07 PM   HDL 40 (L) 06/04/2019 02:07 PM   CHOLHDL 3.1 06/04/2019 02:07 PM   LDLCALC 50 06/04/2019 02:07 PM    Wt Readings from Last 3 Encounters:  09/04/19 172 lb 3.2 oz (78.1 kg)  09/01/19 182 lb 8.7 oz (82.8 kg)  08/09/19 188 lb 15 oz (85.7 kg)     Objective:    Vital Signs:  BP (!) 92/55   Pulse 94   Temp 98.2 F (36.8 C)   Resp 20   Wt 172 lb 3.2 oz (78.1 kg)   BMI 30.50 kg/m    General: Pleasant female appearing in NAD Psych: Normal affect. Neuro: Alert and oriented X 3. Moves all extremities spontaneously. Lungs:  Respirations appear regular and unlabored.  ASSESSMENT & PLAN:    1. Chronic Diastolic CHF/ Reduced RV Function - She has been admitted twice within the past month for recurrent CHF exacerbations. Since returning to SNF on 09/01/2019, her weight has declined by an additional 10 lbs as compared to the hospital scales, now down to 172 lbs. She denies any orthopnea, PND or lower extremity edema and her breathing has overall been at baseline.  - She is currently on Torsemide 80 mg daily. Will plan to obtain a repeat BMET next week to reassess electrolytes and renal function (creatinine stable at 1.26 by labs on 09/01/2019).   2. Permanent Atrial Fibrillation - She failed prior DCCV and developed hyperthyroidism and junctional rhythm with Amiodarone in the past, therefore a rate-control strategy has been pursued. - She denies any recent palpitations and heart rate has been well controlled in the 80's to 90's when checked at SNF. Continue Cardizem CD 180mg  daily and Toprol-XL 25mg  daily for rate-control. If BP remains soft,  will reduce Toprol-XL to 12.5mg  daily.  - She denies any evidence  of active bleeding but did have acute worsening of her anemia during admission and positive FOBT but an aggressive evaluation was not pursued. Remains on Eliquis 5 mg twice daily.  3. Valvular Heart Disease  - s/p tissue AVR in 2014 with MV ring and maze procedure. Most recent echocardiogram last month showed no significant abnormalities.   4. CML  - She is being followed by Oncology and has chronic thrombocytosis with platelets at 1,152 K on most recent check. Remains on Gleevec 100mg  daily per Oncology recommendations.   5. Stage 2-3 CKD - creatinine peaked at 1.65 during recent admission, improved to 1.26 at discharge. Will obtain a repeat BMET next week.    COVID-19 Education: The signs and symptoms of COVID-19 were discussed with the patient and how to seek care for testing (follow up with PCP or arrange E-visit). The importance of social distancing was discussed today.  Time:   Today, I have spent 12 minutes with the patient with telehealth technology discussing the above problems.     Medication Adjustments/Labs and Tests Ordered: Current medicines are reviewed at length with the patient today.  Concerns regarding medicines are outlined above.   Tests Ordered: Orders Placed This Encounter  Procedures  . Basic Metabolic Panel (BMET)    Medication Changes: No orders of the defined types were placed in this encounter.   Follow Up: Keep scheduled follow-up with the [facility] next week.   Signed, Erma Heritage, PA-C  09/04/2019 1:23 PM    [facility]

## 2019-09-10 DIAGNOSIS — K921 Melena: Secondary | ICD-10-CM | POA: Diagnosis not present

## 2019-09-10 DIAGNOSIS — J9601 Acute respiratory failure with hypoxia: Secondary | ICD-10-CM | POA: Diagnosis not present

## 2019-09-10 DIAGNOSIS — Z20822 Contact with and (suspected) exposure to covid-19: Secondary | ICD-10-CM | POA: Diagnosis not present

## 2019-09-10 DIAGNOSIS — I5032 Chronic diastolic (congestive) heart failure: Secondary | ICD-10-CM | POA: Diagnosis not present

## 2019-09-10 DIAGNOSIS — I5021 Acute systolic (congestive) heart failure: Secondary | ICD-10-CM | POA: Diagnosis not present

## 2019-09-10 DIAGNOSIS — J449 Chronic obstructive pulmonary disease, unspecified: Secondary | ICD-10-CM | POA: Diagnosis not present

## 2019-09-10 DIAGNOSIS — K922 Gastrointestinal hemorrhage, unspecified: Secondary | ICD-10-CM | POA: Diagnosis not present

## 2019-09-10 DIAGNOSIS — Z0289 Encounter for other administrative examinations: Secondary | ICD-10-CM | POA: Diagnosis not present

## 2019-09-10 DIAGNOSIS — E440 Moderate protein-calorie malnutrition: Secondary | ICD-10-CM | POA: Diagnosis not present

## 2019-09-10 DIAGNOSIS — Z66 Do not resuscitate: Secondary | ICD-10-CM | POA: Diagnosis not present

## 2019-09-10 DIAGNOSIS — I482 Chronic atrial fibrillation, unspecified: Secondary | ICD-10-CM | POA: Diagnosis not present

## 2019-09-10 DIAGNOSIS — R0602 Shortness of breath: Secondary | ICD-10-CM | POA: Diagnosis not present

## 2019-09-10 DIAGNOSIS — R0600 Dyspnea, unspecified: Secondary | ICD-10-CM | POA: Diagnosis not present

## 2019-09-10 DIAGNOSIS — D62 Acute posthemorrhagic anemia: Secondary | ICD-10-CM | POA: Diagnosis not present

## 2019-09-10 DIAGNOSIS — E860 Dehydration: Secondary | ICD-10-CM | POA: Diagnosis not present

## 2019-09-10 DIAGNOSIS — I4891 Unspecified atrial fibrillation: Secondary | ICD-10-CM | POA: Diagnosis not present

## 2019-09-10 DIAGNOSIS — R000 Tachycardia, unspecified: Secondary | ICD-10-CM | POA: Diagnosis not present

## 2019-09-10 DIAGNOSIS — I480 Paroxysmal atrial fibrillation: Secondary | ICD-10-CM | POA: Diagnosis not present

## 2019-09-11 ENCOUNTER — Encounter (HOSPITAL_COMMUNITY): Payer: Medicare Other | Admitting: Cardiology

## 2019-09-11 DIAGNOSIS — I5032 Chronic diastolic (congestive) heart failure: Secondary | ICD-10-CM | POA: Diagnosis present

## 2019-09-11 DIAGNOSIS — Z66 Do not resuscitate: Secondary | ICD-10-CM | POA: Diagnosis present

## 2019-09-11 DIAGNOSIS — D62 Acute posthemorrhagic anemia: Secondary | ICD-10-CM | POA: Diagnosis present

## 2019-09-11 DIAGNOSIS — J9621 Acute and chronic respiratory failure with hypoxia: Secondary | ICD-10-CM | POA: Diagnosis not present

## 2019-09-11 DIAGNOSIS — E669 Obesity, unspecified: Secondary | ICD-10-CM | POA: Diagnosis present

## 2019-09-11 DIAGNOSIS — Z79899 Other long term (current) drug therapy: Secondary | ICD-10-CM | POA: Diagnosis not present

## 2019-09-11 DIAGNOSIS — R0689 Other abnormalities of breathing: Secondary | ICD-10-CM | POA: Diagnosis not present

## 2019-09-11 DIAGNOSIS — I340 Nonrheumatic mitral (valve) insufficiency: Secondary | ICD-10-CM | POA: Diagnosis not present

## 2019-09-11 DIAGNOSIS — D72819 Decreased white blood cell count, unspecified: Secondary | ICD-10-CM | POA: Diagnosis present

## 2019-09-11 DIAGNOSIS — I4891 Unspecified atrial fibrillation: Secondary | ICD-10-CM | POA: Diagnosis not present

## 2019-09-11 DIAGNOSIS — I2510 Atherosclerotic heart disease of native coronary artery without angina pectoris: Secondary | ICD-10-CM | POA: Diagnosis not present

## 2019-09-11 DIAGNOSIS — Z7901 Long term (current) use of anticoagulants: Secondary | ICD-10-CM | POA: Diagnosis not present

## 2019-09-11 DIAGNOSIS — Z20822 Contact with and (suspected) exposure to covid-19: Secondary | ICD-10-CM | POA: Diagnosis present

## 2019-09-11 DIAGNOSIS — F419 Anxiety disorder, unspecified: Secondary | ICD-10-CM | POA: Diagnosis present

## 2019-09-11 DIAGNOSIS — E611 Iron deficiency: Secondary | ICD-10-CM | POA: Diagnosis present

## 2019-09-11 DIAGNOSIS — D649 Anemia, unspecified: Secondary | ICD-10-CM | POA: Diagnosis present

## 2019-09-11 DIAGNOSIS — F339 Major depressive disorder, recurrent, unspecified: Secondary | ICD-10-CM | POA: Diagnosis not present

## 2019-09-11 DIAGNOSIS — I272 Pulmonary hypertension, unspecified: Secondary | ICD-10-CM | POA: Diagnosis present

## 2019-09-11 DIAGNOSIS — Z856 Personal history of leukemia: Secondary | ICD-10-CM | POA: Diagnosis not present

## 2019-09-11 DIAGNOSIS — J9601 Acute respiratory failure with hypoxia: Secondary | ICD-10-CM | POA: Diagnosis present

## 2019-09-11 DIAGNOSIS — D473 Essential (hemorrhagic) thrombocythemia: Secondary | ICD-10-CM | POA: Diagnosis present

## 2019-09-11 DIAGNOSIS — Z87891 Personal history of nicotine dependence: Secondary | ICD-10-CM | POA: Diagnosis not present

## 2019-09-11 DIAGNOSIS — I480 Paroxysmal atrial fibrillation: Secondary | ICD-10-CM | POA: Diagnosis not present

## 2019-09-11 DIAGNOSIS — I352 Nonrheumatic aortic (valve) stenosis with insufficiency: Secondary | ICD-10-CM | POA: Diagnosis not present

## 2019-09-11 DIAGNOSIS — I959 Hypotension, unspecified: Secondary | ICD-10-CM | POA: Diagnosis present

## 2019-09-11 DIAGNOSIS — J90 Pleural effusion, not elsewhere classified: Secondary | ICD-10-CM | POA: Diagnosis not present

## 2019-09-11 DIAGNOSIS — K922 Gastrointestinal hemorrhage, unspecified: Secondary | ICD-10-CM | POA: Diagnosis present

## 2019-09-11 DIAGNOSIS — E559 Vitamin D deficiency, unspecified: Secondary | ICD-10-CM | POA: Diagnosis not present

## 2019-09-11 DIAGNOSIS — J069 Acute upper respiratory infection, unspecified: Secondary | ICD-10-CM | POA: Diagnosis not present

## 2019-09-11 DIAGNOSIS — C9210 Chronic myeloid leukemia, BCR/ABL-positive, not having achieved remission: Secondary | ICD-10-CM | POA: Diagnosis not present

## 2019-09-11 DIAGNOSIS — R5381 Other malaise: Secondary | ICD-10-CM | POA: Diagnosis not present

## 2019-09-11 DIAGNOSIS — I361 Nonrheumatic tricuspid (valve) insufficiency: Secondary | ICD-10-CM | POA: Diagnosis not present

## 2019-09-11 DIAGNOSIS — F411 Generalized anxiety disorder: Secondary | ICD-10-CM | POA: Diagnosis not present

## 2019-09-11 DIAGNOSIS — E440 Moderate protein-calorie malnutrition: Secondary | ICD-10-CM | POA: Diagnosis present

## 2019-09-11 DIAGNOSIS — R0602 Shortness of breath: Secondary | ICD-10-CM | POA: Diagnosis not present

## 2019-09-11 DIAGNOSIS — K921 Melena: Secondary | ICD-10-CM | POA: Diagnosis present

## 2019-09-11 DIAGNOSIS — J449 Chronic obstructive pulmonary disease, unspecified: Secondary | ICD-10-CM | POA: Diagnosis present

## 2019-09-11 DIAGNOSIS — E876 Hypokalemia: Secondary | ICD-10-CM | POA: Diagnosis present

## 2019-09-11 DIAGNOSIS — I350 Nonrheumatic aortic (valve) stenosis: Secondary | ICD-10-CM | POA: Diagnosis present

## 2019-09-11 DIAGNOSIS — I5033 Acute on chronic diastolic (congestive) heart failure: Secondary | ICD-10-CM | POA: Diagnosis not present

## 2019-09-11 DIAGNOSIS — M1990 Unspecified osteoarthritis, unspecified site: Secondary | ICD-10-CM | POA: Diagnosis present

## 2019-09-11 DIAGNOSIS — K9289 Other specified diseases of the digestive system: Secondary | ICD-10-CM | POA: Diagnosis not present

## 2019-09-11 DIAGNOSIS — I482 Chronic atrial fibrillation, unspecified: Secondary | ICD-10-CM | POA: Diagnosis present

## 2019-09-19 DIAGNOSIS — I10 Essential (primary) hypertension: Secondary | ICD-10-CM | POA: Diagnosis not present

## 2019-09-19 DIAGNOSIS — Z952 Presence of prosthetic heart valve: Secondary | ICD-10-CM | POA: Diagnosis not present

## 2019-09-19 DIAGNOSIS — R0600 Dyspnea, unspecified: Secondary | ICD-10-CM | POA: Diagnosis not present

## 2019-09-19 DIAGNOSIS — J90 Pleural effusion, not elsewhere classified: Secondary | ICD-10-CM | POA: Diagnosis not present

## 2019-09-19 DIAGNOSIS — I959 Hypotension, unspecified: Secondary | ICD-10-CM | POA: Diagnosis not present

## 2019-09-19 DIAGNOSIS — I5033 Acute on chronic diastolic (congestive) heart failure: Secondary | ICD-10-CM | POA: Diagnosis not present

## 2019-09-19 DIAGNOSIS — R6521 Severe sepsis with septic shock: Secondary | ICD-10-CM | POA: Diagnosis not present

## 2019-09-19 DIAGNOSIS — I491 Atrial premature depolarization: Secondary | ICD-10-CM | POA: Diagnosis not present

## 2019-09-19 DIAGNOSIS — I5032 Chronic diastolic (congestive) heart failure: Secondary | ICD-10-CM | POA: Diagnosis not present

## 2019-09-19 DIAGNOSIS — J9621 Acute and chronic respiratory failure with hypoxia: Secondary | ICD-10-CM | POA: Diagnosis not present

## 2019-09-19 DIAGNOSIS — F411 Generalized anxiety disorder: Secondary | ICD-10-CM | POA: Diagnosis not present

## 2019-09-19 DIAGNOSIS — I2510 Atherosclerotic heart disease of native coronary artery without angina pectoris: Secondary | ICD-10-CM | POA: Diagnosis not present

## 2019-09-19 DIAGNOSIS — K9289 Other specified diseases of the digestive system: Secondary | ICD-10-CM | POA: Diagnosis not present

## 2019-09-19 DIAGNOSIS — K922 Gastrointestinal hemorrhage, unspecified: Secondary | ICD-10-CM | POA: Diagnosis not present

## 2019-09-19 DIAGNOSIS — I480 Paroxysmal atrial fibrillation: Secondary | ICD-10-CM | POA: Diagnosis not present

## 2019-09-19 DIAGNOSIS — A419 Sepsis, unspecified organism: Secondary | ICD-10-CM | POA: Diagnosis not present

## 2019-09-19 DIAGNOSIS — F339 Major depressive disorder, recurrent, unspecified: Secondary | ICD-10-CM | POA: Diagnosis not present

## 2019-09-19 DIAGNOSIS — I469 Cardiac arrest, cause unspecified: Secondary | ICD-10-CM | POA: Diagnosis not present

## 2019-09-19 DIAGNOSIS — C9290 Myeloid leukemia, unspecified, not having achieved remission: Secondary | ICD-10-CM | POA: Diagnosis not present

## 2019-09-19 DIAGNOSIS — R7989 Other specified abnormal findings of blood chemistry: Secondary | ICD-10-CM | POA: Diagnosis not present

## 2019-09-19 DIAGNOSIS — R079 Chest pain, unspecified: Secondary | ICD-10-CM | POA: Diagnosis not present

## 2019-09-19 DIAGNOSIS — I509 Heart failure, unspecified: Secondary | ICD-10-CM | POA: Diagnosis not present

## 2019-09-19 DIAGNOSIS — D649 Anemia, unspecified: Secondary | ICD-10-CM | POA: Diagnosis not present

## 2019-09-19 DIAGNOSIS — R0689 Other abnormalities of breathing: Secondary | ICD-10-CM | POA: Diagnosis not present

## 2019-09-19 DIAGNOSIS — R062 Wheezing: Secondary | ICD-10-CM | POA: Diagnosis not present

## 2019-09-19 DIAGNOSIS — J449 Chronic obstructive pulmonary disease, unspecified: Secondary | ICD-10-CM | POA: Diagnosis not present

## 2019-09-19 DIAGNOSIS — I4891 Unspecified atrial fibrillation: Secondary | ICD-10-CM | POA: Diagnosis not present

## 2019-09-19 DIAGNOSIS — E0590 Thyrotoxicosis, unspecified without thyrotoxic crisis or storm: Secondary | ICD-10-CM | POA: Diagnosis not present

## 2019-09-19 DIAGNOSIS — E559 Vitamin D deficiency, unspecified: Secondary | ICD-10-CM | POA: Diagnosis not present

## 2019-09-19 DIAGNOSIS — I350 Nonrheumatic aortic (valve) stenosis: Secondary | ICD-10-CM | POA: Diagnosis not present

## 2019-09-19 DIAGNOSIS — R5381 Other malaise: Secondary | ICD-10-CM | POA: Diagnosis not present

## 2019-09-19 DIAGNOSIS — I5043 Acute on chronic combined systolic (congestive) and diastolic (congestive) heart failure: Secondary | ICD-10-CM | POA: Diagnosis not present

## 2019-09-19 DIAGNOSIS — C9210 Chronic myeloid leukemia, BCR/ABL-positive, not having achieved remission: Secondary | ICD-10-CM | POA: Diagnosis not present

## 2019-09-19 DIAGNOSIS — J80 Acute respiratory distress syndrome: Secondary | ICD-10-CM | POA: Diagnosis not present

## 2019-09-19 DIAGNOSIS — E876 Hypokalemia: Secondary | ICD-10-CM | POA: Diagnosis not present

## 2019-09-26 ENCOUNTER — Other Ambulatory Visit: Payer: Self-pay

## 2019-09-26 ENCOUNTER — Ambulatory Visit (HOSPITAL_COMMUNITY)
Admission: RE | Admit: 2019-09-26 | Discharge: 2019-09-26 | Disposition: A | Discharge status: Home / Self Care | Payer: Medicare Other | Source: Ambulatory Visit | Attending: Cardiology | Admitting: Cardiology

## 2019-09-26 DIAGNOSIS — J449 Chronic obstructive pulmonary disease, unspecified: Secondary | ICD-10-CM

## 2019-09-26 DIAGNOSIS — I5032 Chronic diastolic (congestive) heart failure: Secondary | ICD-10-CM | POA: Diagnosis not present

## 2019-09-26 DIAGNOSIS — C9210 Chronic myeloid leukemia, BCR/ABL-positive, not having achieved remission: Secondary | ICD-10-CM | POA: Diagnosis not present

## 2019-09-26 DIAGNOSIS — I350 Nonrheumatic aortic (valve) stenosis: Secondary | ICD-10-CM | POA: Diagnosis not present

## 2019-09-26 DIAGNOSIS — E0590 Thyrotoxicosis, unspecified without thyrotoxic crisis or storm: Secondary | ICD-10-CM

## 2019-09-26 DIAGNOSIS — I4891 Unspecified atrial fibrillation: Secondary | ICD-10-CM | POA: Diagnosis not present

## 2019-09-26 MED ORDER — POTASSIUM CHLORIDE CRYS ER 20 MEQ PO TBCR: EXTENDED_RELEASE_TABLET | ORAL | 5 refills | Status: AC

## 2019-09-26 MED ORDER — METOPROLOL SUCCINATE ER 25 MG PO TB24: 25.0000 mg | ORAL_TABLET | Freq: Two times a day (BID) | ORAL | 5 refills | Status: AC

## 2019-09-26 NOTE — Progress Notes (Signed)
Heart Failure TeleHealth Note  Due to national recommendations of social distancing due to [place] 19, Audio/video telehealth visit is felt to be most appropriate for this patient at this time.  See MyChart message from today for patient consent regarding telehealth for [facility].  Date:  09/26/2019   ID:  Baron Sane, DOB [birth year], MRN [id number]  Location: Home  Provider location: [place] [facility] Type of Visit: Established patient   PCP:  Frances Maywood, FNP  Cardiologist:  [name], MD  Chief Complaint: Fatigue   History of Present Illness: Nicole [name] is a 84 y.o. female who presents via audio/video conferencing for a telehealth visit today.     she denies symptoms worrisome for COVID 19.   Patient has a history of COPD (53 pack years quit April 2014), atrial fibrillation s/p AVR with pericardial valve, mitral annuloplasty, MAZE procedure and clipping of the LA appendage (99991111) and diastolic CHF. Daughter is Personnel officer ([employer] nurse).   She went back into atrial fibrillation around 5/16.  Given increased symptoms when in atrial fibrillation, I started her on amiodarone and planned for DCCV after amiodarone load. She went out of atrial fibrillation and into a stable junctional rhythm with rate in the 60s.  She also developed subclinical hyperthyroidism.  I stopped her amiodarone.  She saw an endocrinologist who recommended that she take methimazole.  However, she never started it.  No BRBPR or melena on Xarelto.   She went back into atrial fibrillation again in 1/18.  We decided to give her a trial of DCCV without starting an antiarrhythmic.  DCCV was attempted in 1/18 but failed. She remains in atrial fibrillation today.   At a prior appt, pt's platelets were noted to be > 1,000K.  She was seen by Dr. Heber Raritan and diagnosed with CML.    She was admitted to [facility] in 1/21 with COPD + CHF exacerbation.  She received a dose of IV Lasix and treatment for CHF.   Echo in 1/21 showed EF 55-60%, mildly dilated RV, stable repaired mitral valve and bioprosthetic aortic valve.   She was admitted twice in 3/21 with AECOPD and CHF/volume overload.  She was treated with steroids, antibiotics, and diuresis. Anemia was noted and she had tranfusion but no endoscopy.  Echo in 3/21 with EF 60-65%, RV moderately dilated with moderately decreased systolic function, s/p MV repair with trivial MR and mean gradient 3 mmHg, Bioprosthetic aortic valve with mean gradient 10 mmHg.   She is now at a SNF in [place].  She was seen in the ER there with anemia recently and apparently had a transfusion again. Eliquis was held and recommended that she restart it at this appt if no overt bleeding. She says she is doing well currenty.  Not leaving her room at the SNF because she is afraid of COVID but not short of breath walking in her room.  No orthopnea/PND.  No BRBPR/melena.  No lightheadedness.    Labs (9/14): K 4, creatinine 0.9 => 1.04, HCT 33.7, TnI 0.21 (while hospitalized in [place]), BNP 158, TSH normal, AST normal, ALT 39 Labs (03/16/13):  K 3.9 Cr 1.0 Labs (6/16): K 4.3, creatinine 0.99, Hgb 14.6 Labs (1/17): K 4.3, creatinine 1.08 Labs (2/17): K 3.5, creatinine 1.04, BNP 220, free T3 normal, free T4 high, TSH 0.09, LFTs normal Labs (6/17): K 3.7, creatinine 1.04, TSH/free T3/free T4 normal, LDL 54, HDL 54 Labs (1/18): K 3.9, creatinine 1.08, BNP 129, HCT 40.2 Labs (2/18):  K 4.5, creatinine 0.86 Labs (5/18): TSH normal Labs (9/18): K 4, creatinine 1.0 Labs (12/18): K 3.3, creatinine 0.96 Labs (6/19): hgb 13.8, K 3.9, creatinine 1.11 Labs (2/20): LDL 54, K 4, creatinine 0.95, hgb 14.3 Labs (1/21): K 4.2, creatinine 0.93, BNP 527, WBCs 16.7, hgb 10, plts 1522 Labs (4/21): K 3.3, creatinine 1.34, hgb 8.6, plts 1513  PMH: 1. COPD: PFTs (5/15) with FEV1 61%, RVC 63%, ratio 96%, DLCO 50%.   2. Atrial fibrillation: First noted in 7/14.  DCCV in 8/14 to NSR.  Recurred by  9/14.  DCCV to NSR 9/14 but recurred. S/p Maze procedure 10/14.  Recurrent atrial fibrillation around 5/16, later developed a junctional rhythm on amiodarone and amiodarone stopped.  She was noted to be back in atrial fibrillation in 1/18, failed DCCV in 1/18.  3. Hyperlipidemia 4. Diastolic CHF: Echo (123456) with EF 55-60%, grade II diastolic dysfunction, mild LVH, moderate to severe AS with mean gradient 33 mmHg/peak 49 mmHg and AVA 0.74 cm^2.  Echo (2/17) with EF 60-65%, mild focal basal septal hypertrophy, bioprosthetic aortic valve appeared normal, s/p mitral valve repair with no significant mitral stenosis.  - Echo (12/18): EF 60-65%, moderate RV dilation with mildly decreased systolic function, peak RV-RA gradient 31 mmHg, normal bioprosthetic aortic valve, normal repaired mitral valve.  - Echo (1/21): EF 55-60%, mildly dilated RV with normal systolic function, severe biatrial enlargement, s/p mitral valve repair with trivial MR and mean gradient 7 mmHg suggesting mild mitral stenosis, bioprosthetic aortic valve with mean gradient 12 mmHg.  - Echo (3/21): EF 60-65%, RV moderately dilated with moderately decreased systolic function, s/p MV repair with trivial MR and mean gradient 3 mmHg, Bioprosthetic aortic valve with mean gradient 10 mmHg.  5. Severe AS/Moderate MR. 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 Normal coronaries cath 10/14 7. Ventral hernia.  8. Low back pain: Lumbar disc disease.  9. Hyperthyroidism: Subclinical.  Likely related to amiodarone use.  10. Junctional rhythm.  11. CML: BCR/ABL+. Has marked thrombocytosis.   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  Lives alone.  Daughter come to appointments.   FH: No premature CAD.  Breast cancer.   ROS: All systems reviewed and negative except as per HPI.   Current Outpatient Medications  Medication Sig Dispense Refill  . diltiazem (CARDIZEM  CD) 240 MG 24 hr capsule Take 240 mg by mouth daily.    . potassium chloride SA (KLOR-CON) 20 MEQ tablet Take 2 tablets (40 mEq total) by mouth every morning AND 1 tablet (20 mEq total) every evening. 90 tablet 5  . albuterol (VENTOLIN HFA) 108 (90 Base) MCG/ACT inhaler Inhale 2 puffs into the lungs as needed.     . ALPRAZolam (XANAX) 0.5 MG tablet Take 1 tablet (0.5 mg total) by mouth 2 (two) times daily as needed for anxiety. 10 tablet 0  . arformoterol (BROVANA) 15 MCG/2ML NEBU Take 2 mLs (15 mcg total) by nebulization 2 (two) times daily. J 44.9 120 mL 3  . atorvastatin (LIPITOR) 40 MG tablet TAKE 1 TABLET BY MOUTH EVERY DAY IN THE EVENING (Patient taking differently: Take 40 mg by mouth daily at 6 PM. TAKE 1 TABLET BY MOUTH EVERY DAY IN THE EVENING) 90 tablet 2  . budesonide (PULMICORT) 0.5 MG/2ML nebulizer solution Take 2 mLs (0.5 mg total) by nebulization 2 (two) times daily. J 44.9 120 mL 12  . busPIRone (BUSPAR)  10 MG tablet Take 1 tablet (10 mg total) by mouth 2 (two) times daily. (Patient taking differently: Take 10 mg by mouth daily. ) 10 tablet 0  . docusate sodium (COLACE) 100 MG capsule Take 2 capsules (200 mg total) by mouth at bedtime. (Patient taking differently: Take 200 mg by mouth at bedtime as needed for mild constipation. ) 10 capsule 0  . DULoxetine (CYMBALTA) 20 MG capsule Take 1 capsule (20 mg total) by mouth daily. 30 capsule 0  . ELIQUIS 5 MG TABS tablet TAKE 1 TABLET BY MOUTH TWICE A DAY (Patient taking differently: Take 5 mg by mouth 2 (two) times daily. ) 60 tablet 3  . ergocalciferol (VITAMIN D2) 1.25 MG (50000 UT) capsule Take 1 capsule (50,000 Units total) by mouth once a week. 12 capsule 2  . ferrous sulfate 325 (65 FE) MG EC tablet Take 1 tablet (325 mg total) by mouth in the morning and at bedtime. 180 tablet 3  . imatinib (GLEEVEC) 100 MG tablet Take 1 tablet (100 mg total) by mouth daily. Take with meals and large glass of water.Caution:Chemotherapy 60 tablet 0  .  metoprolol succinate (TOPROL-XL) 25 MG 24 hr tablet Take 1 tablet (25 mg total) by mouth in the morning and at bedtime. 60 tablet 5  . Multiple Vitamins-Minerals (MULTIVITAMIN WITH MINERALS) tablet Take 1 tablet by mouth daily.    . pantoprazole (PROTONIX) 40 MG tablet Take 1 tablet (40 mg total) by mouth daily at 6 (six) AM.    . polyethylene glycol (MIRALAX / GLYCOLAX) 17 g packet Take 17 g by mouth daily as needed for mild constipation. 14 each 0  . prochlorperazine (COMPAZINE) 10 MG tablet Take 1 tablet (10 mg total) by mouth every 6 (six) hours as needed for nausea or vomiting. 30 tablet 3  . torsemide (DEMADEX) 20 MG tablet Take 4 tablets (80 mg total) by mouth daily.     No current facility-administered medications for this encounter.   Exam:  (Video/Tele Health Call; Exam is subjective and or/visual.) HR 60, BP 110/50 (taken at SNF) General:  Speaks in full sentences. No resp difficulty. Lungs: Normal respiratory effort with conversation.  Abdomen: Non-distended per patient report Extremities: Pt denies edema. Neuro: Alert & oriented x 3.    Assessment/Plan:  1. Valvular heart disease: Severe aortic stenosis and moderate MR, s/p AVR (tissue) and MV ring with Maze procedure 03/15/13.  - Stable valves on 3/21 echo.     2. Atrial fibrillation: s/p MAZE. She had been on amiodarone to keep her in NSR.  She went into a regular junctional rhythm on amiodarone.  She also developed hyperthyroidism in the setting of amiodarone use. Amiodarone was stopped. She is now in persistent atrial fibrillation and failed DCCV in 1/18.  She will stay off amiodarone.  At this point, will continue control/anticoagulation strategy. Given prior MAZE, she is probably not a good atrial fibrillation ablation candidate. - Eliquis has been on hold for a couple of weeks with anemia.  Will restart at 5 mg bid. Follow CBC closely, check in 1 week.    - Stop digoxin.  - Increase Toprol XL back to 25 mg bid.  -  Continue diltiazem CD 240 mg daily.  3. Chronic diastolic CHF:  NYHA class III symptoms. Stable since she has been in SNF.  - Continue torsemide 80 mg daily, will arrange for BMET.  4. COPD: stable. She no longer is smoking. Follows with pulmonary.  5. Hyperthyroidism: Resolved.  She  is not on methimazole.   6. CML: Now on Gleevec, followed by oncology.    COVID screen The patient does not have any symptoms that suggest any further testing/ screening at this time.  Social distancing reinforced today.  Patient Risk: After full review of this patients clinical status, I feel that they are at moderate risk for cardiac decompensation at this time.  Relevant cardiac medications were reviewed at length with the patient today. The patient does not have concerns regarding their medications at this time.   Recommended follow-up:  6 wks  Today, I have spent 18 minutes with the patient with telehealth technology discussing the above issues .    Signed, [name], MD  09/26/2019  [place] [address] [facility] and [place] [address] [phone] (office) [phone] (fax)

## 2019-09-26 NOTE — Progress Notes (Addendum)
AVS and Rx with orders faxed to skilled nursing facility. [phone]. Called facility and spoke with nurse. She confirmed to having received fax.  Questions answered. Advised if she had any questions to call our office.

## 2019-09-26 NOTE — Patient Instructions (Addendum)
STOP Digoxin   INCREASE Toprol XL to 25mg  (1 tab) twice a day    RESTART Eliquis 5mg  (1 tab) twice a day    Please draw labs in 1 week: BMET, CBC, Fe/TIBC/Ferritin We will only contact you if something comes back abnormal or we need to make some changes. Otherwise no news is good news!  Fax results to [phone] Attn: [name] MD   Your physician recommends that you schedule a follow-up appointment in: 1 month with the Nurse Practitioner  Next appointment date:  Thursday, May 27th,  2021 at 12pm Garage code [id number]  Please call office at [phone] option [phone] if you have any questions or concerns.   At the [facility], you and your health needs are our priority. As part of our continuing mission to provide you with exceptional heart care, we have created designated Provider Care Teams. These Care Teams include your primary Cardiologist (physician) and Advanced Practice Providers (APPs- Physician Assistants and Nurse Practitioners) who all work together to provide you with the care you need, when you need it.   You may see any of the following providers on your designated Care Team at your next follow up: Marland Kitchen Dr Glori Bickers . Dr [name] . Darrick Grinder, NP . Lyda Jester, PA . Audry Riles, PharmD   Please be sure to bring in all your medications bottles to every appointment.

## 2019-10-01 DIAGNOSIS — R5381 Other malaise: Secondary | ICD-10-CM | POA: Diagnosis not present

## 2019-10-01 DIAGNOSIS — D649 Anemia, unspecified: Secondary | ICD-10-CM | POA: Diagnosis not present

## 2019-10-01 DIAGNOSIS — K922 Gastrointestinal hemorrhage, unspecified: Secondary | ICD-10-CM | POA: Diagnosis not present

## 2019-10-01 DIAGNOSIS — I4891 Unspecified atrial fibrillation: Secondary | ICD-10-CM | POA: Diagnosis not present

## 2019-10-05 DIAGNOSIS — C9290 Myeloid leukemia, unspecified, not having achieved remission: Secondary | ICD-10-CM | POA: Diagnosis not present

## 2019-10-12 DIAGNOSIS — R5381 Other malaise: Secondary | ICD-10-CM | POA: Diagnosis not present

## 2019-10-12 DIAGNOSIS — D649 Anemia, unspecified: Secondary | ICD-10-CM | POA: Diagnosis not present

## 2019-10-12 DIAGNOSIS — K922 Gastrointestinal hemorrhage, unspecified: Secondary | ICD-10-CM | POA: Diagnosis not present

## 2019-10-12 DIAGNOSIS — I4891 Unspecified atrial fibrillation: Secondary | ICD-10-CM | POA: Diagnosis not present

## 2019-10-13 DIAGNOSIS — A419 Sepsis, unspecified organism: Secondary | ICD-10-CM | POA: Diagnosis not present

## 2019-10-13 DIAGNOSIS — R7989 Other specified abnormal findings of blood chemistry: Secondary | ICD-10-CM | POA: Diagnosis not present

## 2019-10-13 DIAGNOSIS — J90 Pleural effusion, not elsewhere classified: Secondary | ICD-10-CM | POA: Diagnosis not present

## 2019-10-13 DIAGNOSIS — I4891 Unspecified atrial fibrillation: Secondary | ICD-10-CM | POA: Diagnosis not present

## 2019-10-13 DIAGNOSIS — R6521 Severe sepsis with septic shock: Secondary | ICD-10-CM | POA: Diagnosis not present

## 2019-10-13 DIAGNOSIS — Z952 Presence of prosthetic heart valve: Secondary | ICD-10-CM | POA: Diagnosis not present

## 2019-10-13 DIAGNOSIS — I469 Cardiac arrest, cause unspecified: Secondary | ICD-10-CM | POA: Diagnosis not present

## 2019-10-13 DIAGNOSIS — I509 Heart failure, unspecified: Secondary | ICD-10-CM | POA: Diagnosis not present

## 2019-10-14 ENCOUNTER — Other Ambulatory Visit (HOSPITAL_COMMUNITY): Payer: Self-pay | Admitting: Cardiology

## 2019-10-15 NOTE — Telephone Encounter (Signed)
Per Epic, patient has passed away.

## 2019-10-16 ENCOUNTER — Other Ambulatory Visit (HOSPITAL_COMMUNITY): Payer: Self-pay | Admitting: Cardiology

## 2019-10-25 ENCOUNTER — Encounter (HOSPITAL_COMMUNITY): Payer: Medicare Other

## 2019-10-30 DEATH — deceased

## 2020-10-10 IMAGING — DX DG CHEST 1V PORT
1 series · 1 of 1 positions shown · non-contrast
Comparison: 08/04/2019 and older exams

CLINICAL DATA: Followup pleural effusion

EXAM:
PORTABLE CHEST 1 VIEW

[chest ap]
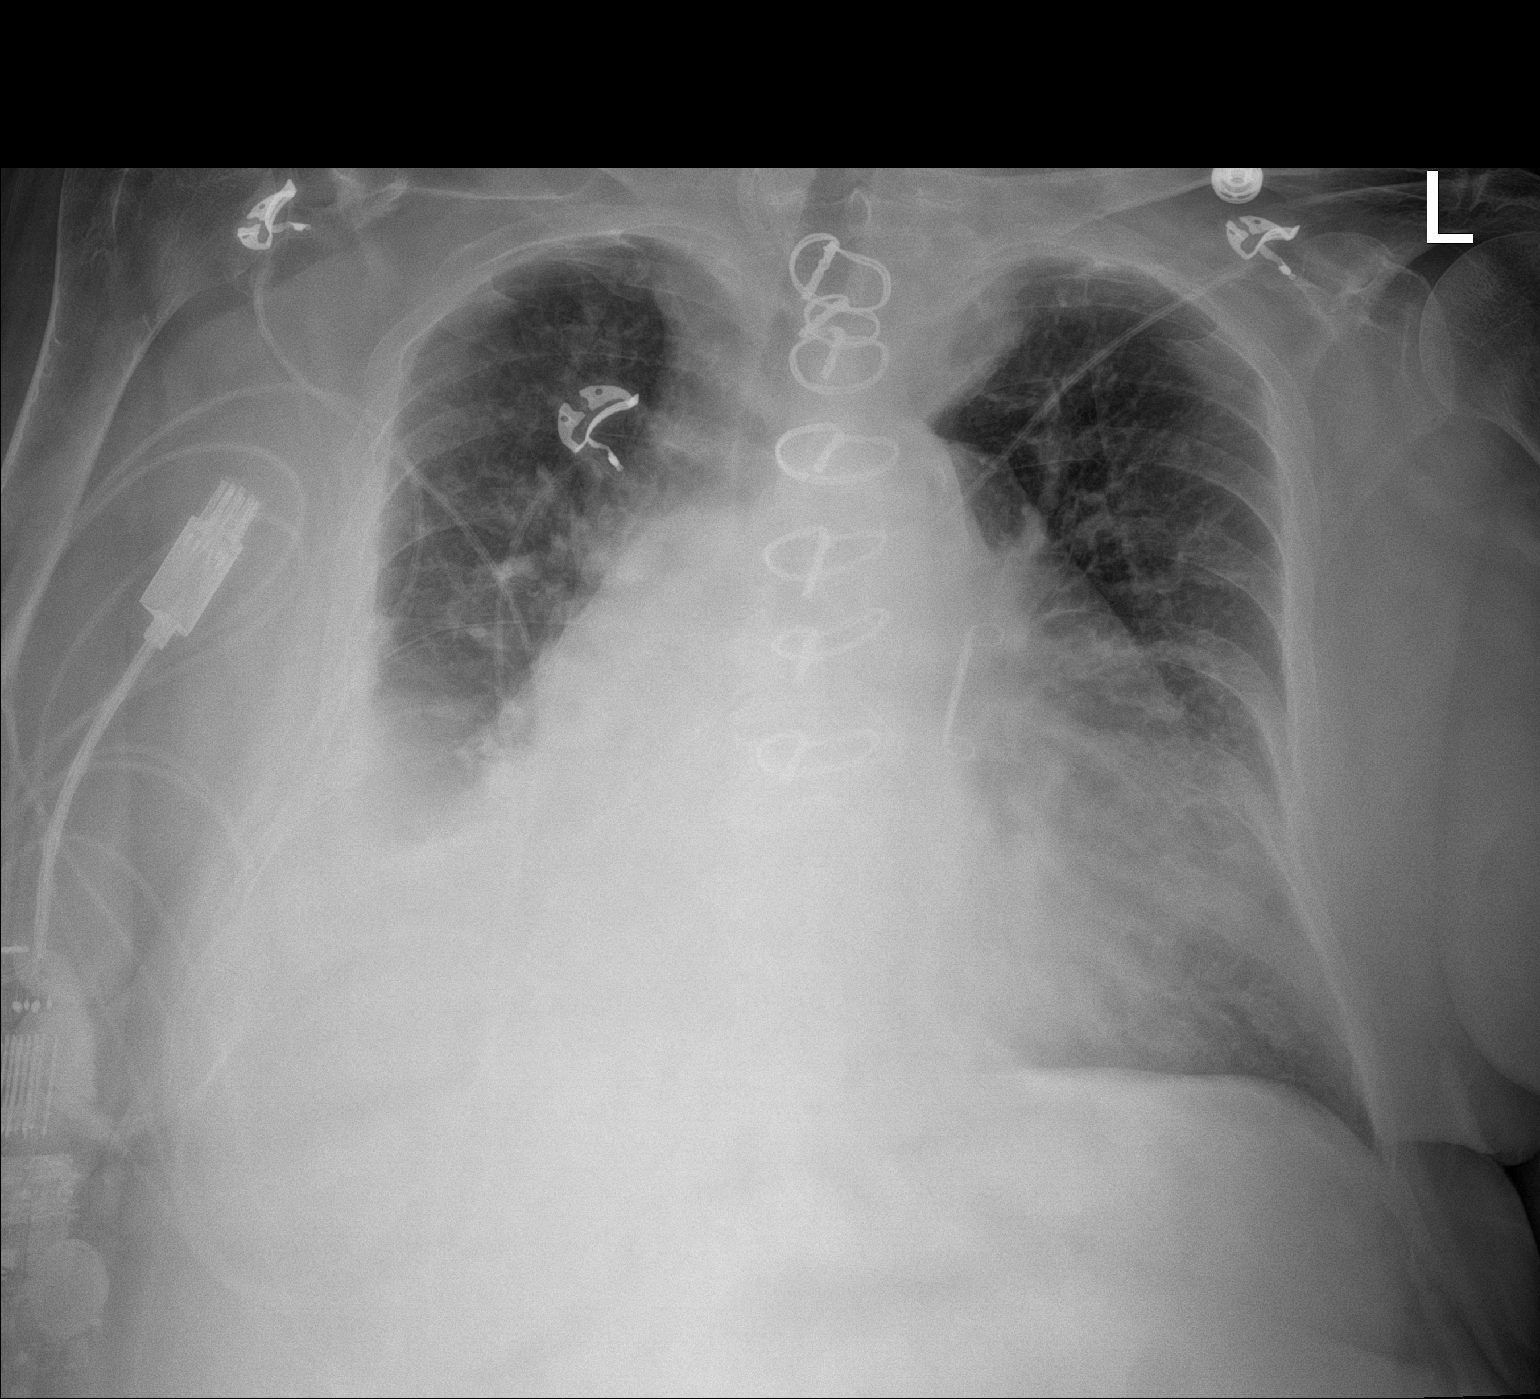

[1 of 1 positions shown; findings below may reference images not displayed]

FINDINGS: Moderate right pleural effusion obscures the right hemidiaphragm
without significant change from the previous exam. Mild vascular
interstitial prominence is stable. No new lung abnormalities.

Changes from previous cardiac surgery are stable. Cardiac silhouette
is enlarged.
IMPRESSION: 1. No significant change from the most recent prior study.
2. Moderate right pleural effusion. Vascular congestion and mild
interstitial prominence without overt pulmonary edema.

## 2020-10-31 IMAGING — CT CT CHEST W/O CM
1 of 2 series · 14 of 30 positions shown, 18 images · non-contrast
Comparison: CT chest angiogram, 06/24/2019

CLINICAL DATA: Hypoxemia

EXAM:
CT CHEST WITHOUT CONTRAST
TECHNIQUE: Multidetector CT imaging of the chest was performed following the
standard protocol without IV contrast.

[Series 3: chest w/o 2mm st · axial · non-contrast · 0.91mm/px · z∈[+1170,+1434]mm · 14 of 156 slices shown, 18 images]
[im 12/156  mediastinal]
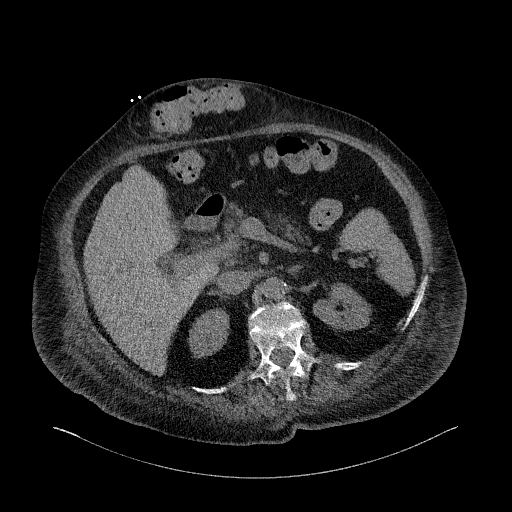
[im 12/156  lung]
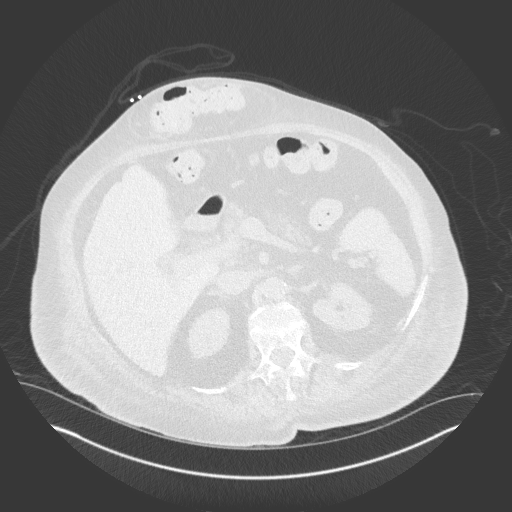
[im 23/156  lung]
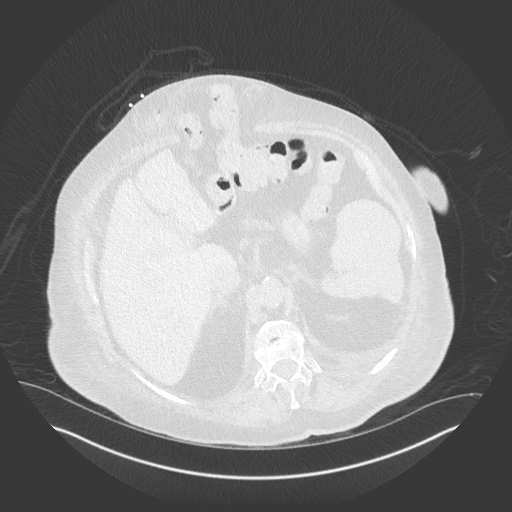
[im 34/156  lung]
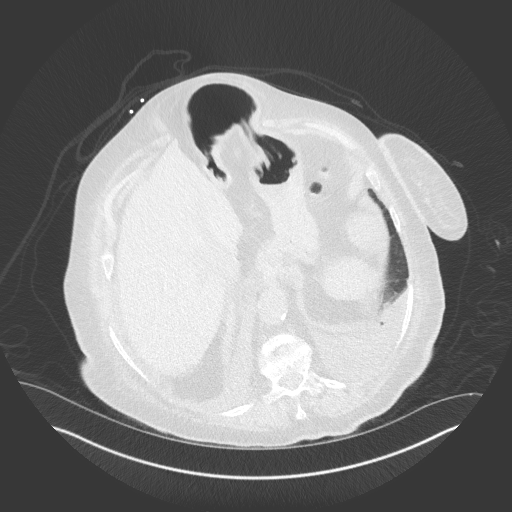
[im 45/156  lung]
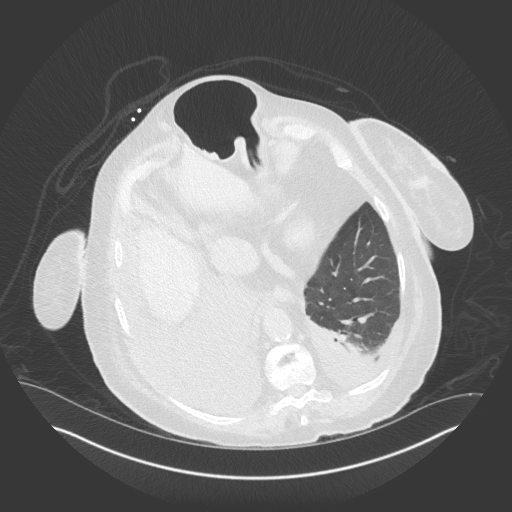
[im 56/156  mediastinal]
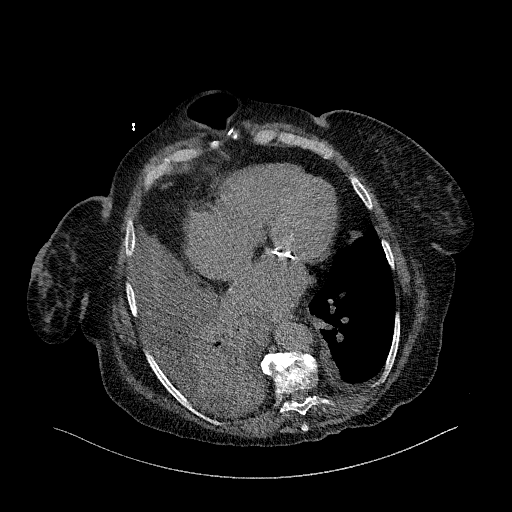
[im 56/156  lung]
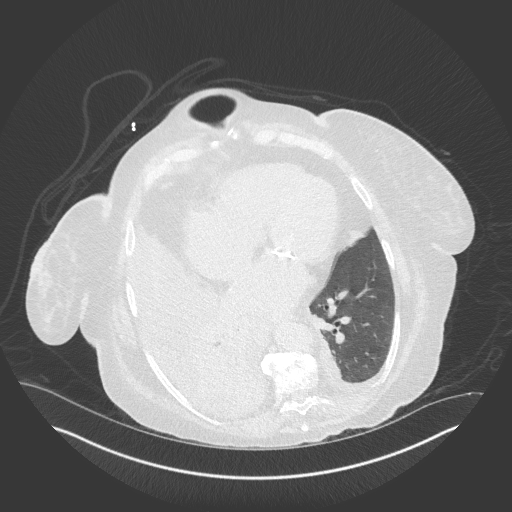
[im 67/156  lung]
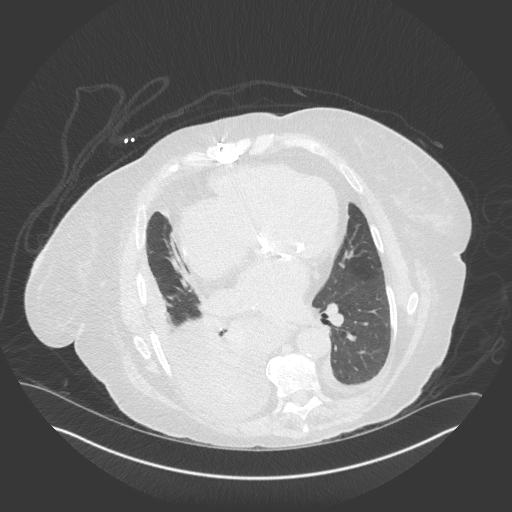
[im 74/156  lung]
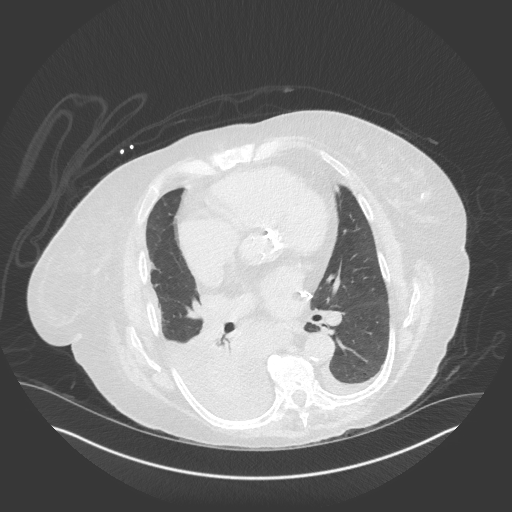
[im 78/156  lung]
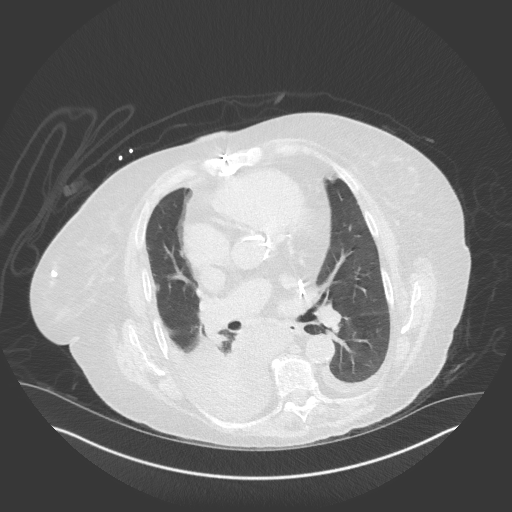
[im 89/156  mediastinal]
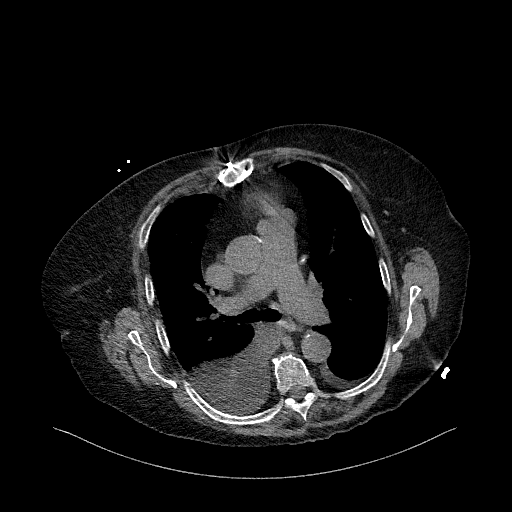
[im 89/156  lung]
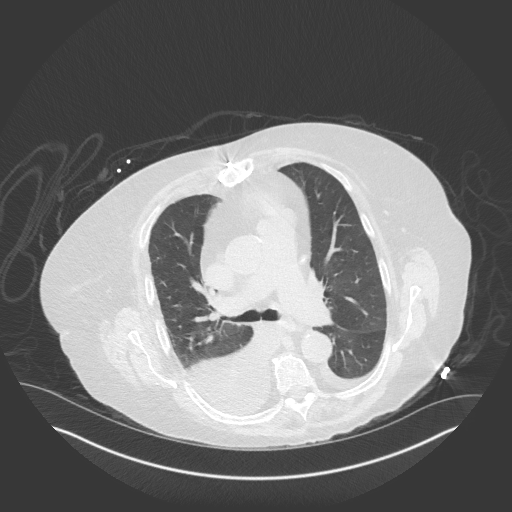
[im 100/156  lung]
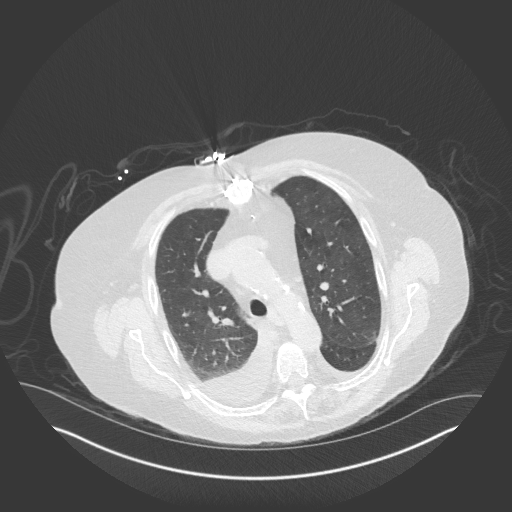
[im 111/156  lung]
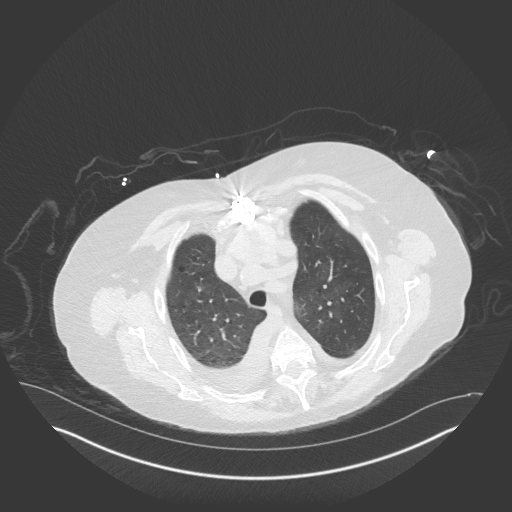
[im 122/156  lung]
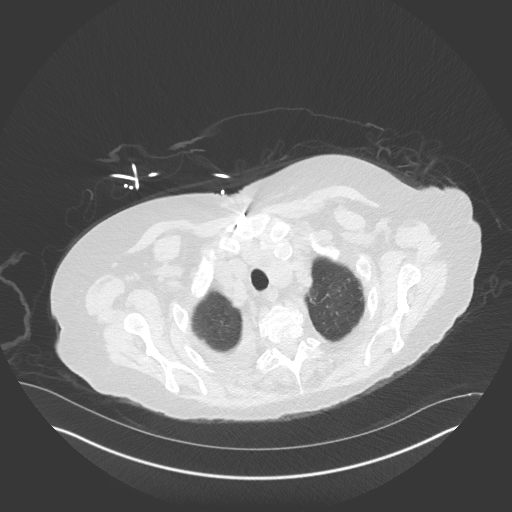
[im 133/156  mediastinal]
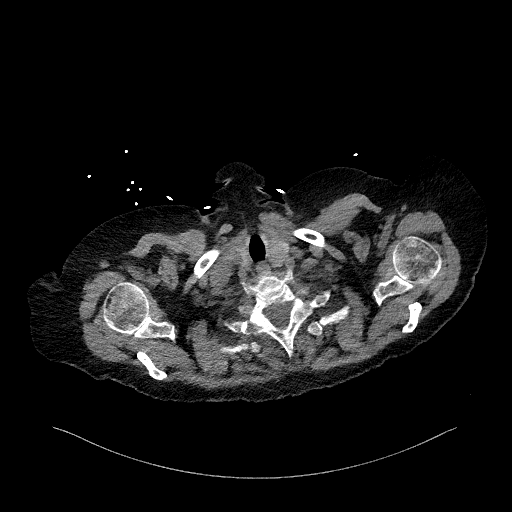
[im 133/156  lung]
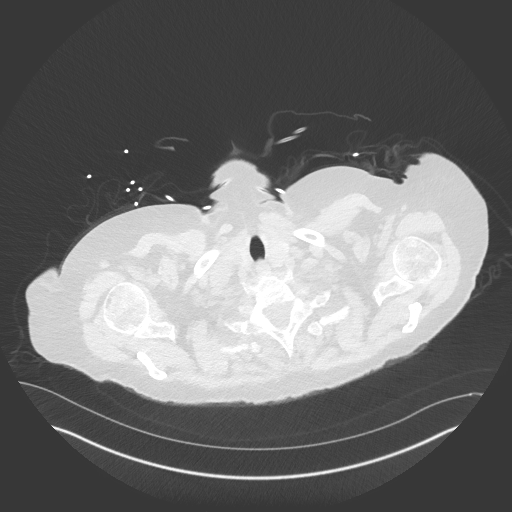
[im 144/156  lung]
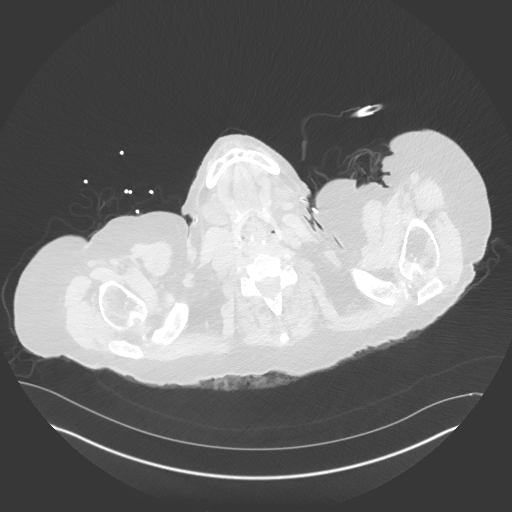

[14 of 30 positions shown; findings below may reference images not displayed]

FINDINGS: Cardiovascular: Aortic atherosclerosis. Status post aortic and
mitral valve repair. Cardiomegaly. Scattered coronary artery
calcifications. No pericardial effusion.

Mediastinum/Nodes: No enlarged mediastinal, hilar, or axillary lymph
nodes. Large left-sided multinodular goiter with significant
substernal component. Trachea, and esophagus demonstrate no
significant findings.

Lungs/Pleura: Minimal centrilobular and paraseptal emphysema.
Moderate right, small left pleural effusions with associated
atelectasis or consolidation, effusions increased compared to prior
CT dated 06/24/2019. No pleural effusion or pneumothorax.

Upper Abdomen: No acute abnormality. Large, partially imaged ventral
midline abdominal hernia containing stomach and transverse colon.

Musculoskeletal: No chest wall mass or suspicious bone lesions
identified.
IMPRESSION: 1. Moderate right, small left pleural effusions with associated
atelectasis or consolidation, increased compared to prior CT dated
06/24/2019.
2. Minimal emphysema.  Emphysema ([id number]).
3. Cardiomegaly and scattered coronary artery calcifications.
4. Aortic Atherosclerosis ([id number]).
5. Large left-sided multinodular thyroid goiter with significant
substernal component.
6. Large, partially imaged ventral midline abdominal hernia
containing stomach and transverse colon.

## 2020-11-01 IMAGING — DX DG CHEST 1V
1 series · 1 of 1 positions shown · non-contrast
Comparison: August 27, 2019

CLINICAL DATA: Status post thoracentesis.

EXAM:
CHEST  1 VIEW

[chest ap]
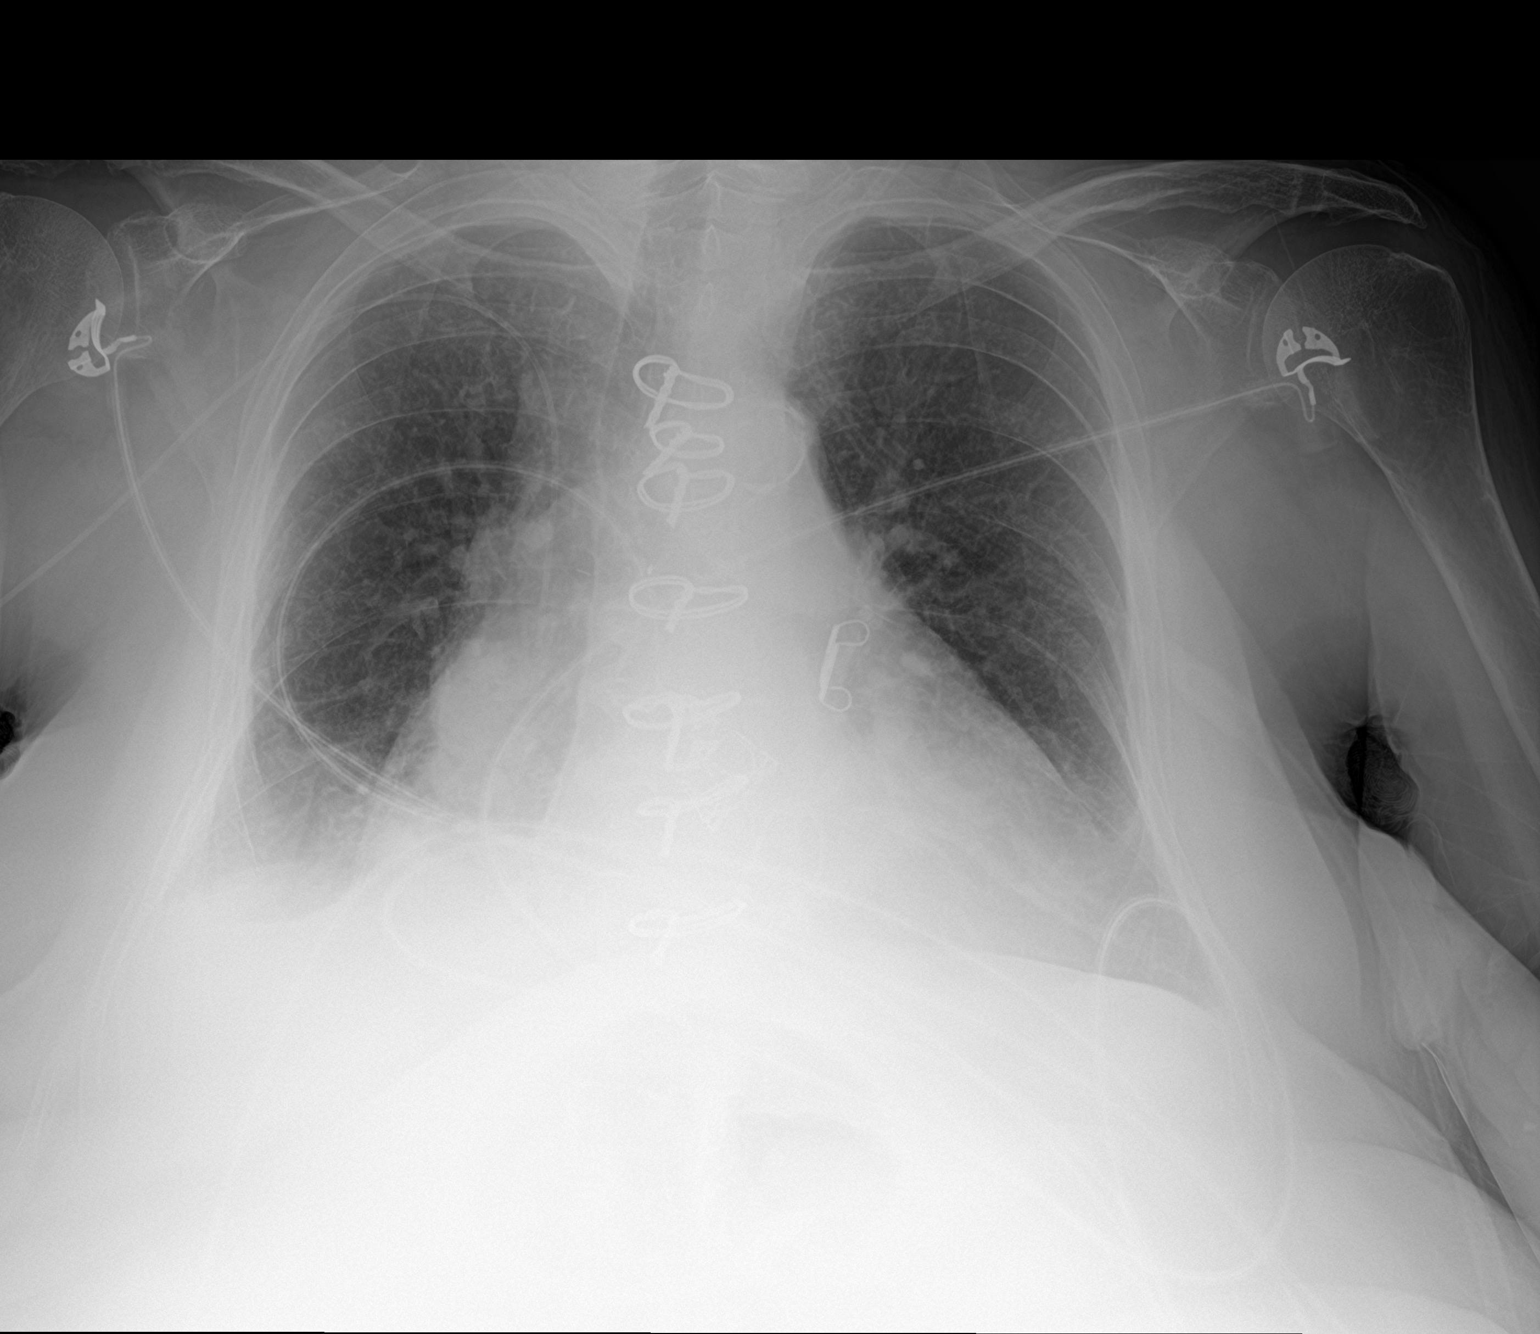

[1 of 1 positions shown; findings below may reference images not displayed]

FINDINGS: The right-sided PICC line is well position. The right-sided pleural
effusion has decreased since the prior study. There remains a small
right-sided effusion with adjacent airspace disease favored to
represent atelectasis. The heart size is enlarged. The patient is
status post prior median sternotomy and atrial appendage clip
placement. There is a probable small left-sided pleural effusion.
IMPRESSION: 1. No pneumothorax status post right-sided thoracentesis.
2. Significant interval decrease in size of the right-sided pleural
effusion.
3. Cardiomegaly with persistent findings of vascular congestion.
4. Persistent small left-sided pleural effusion.

## 2020-11-01 IMAGING — US IR THORACENTESIS ASP PLEURAL SPACE W/IMG GUIDE
1 series · 8 of 8 positions shown · non-contrast
Comparison: none

INDICATION: Patient with history of heart failure, COPD, dyspnea, and bilateral
pleural effusions, right > left. Request is made for diagnostic and
therapeutic right thoracentesis.

[Series 1: ir (id) (id)/(id)/(id) ir · 8 of 8 slices shown]
[im 1/8]
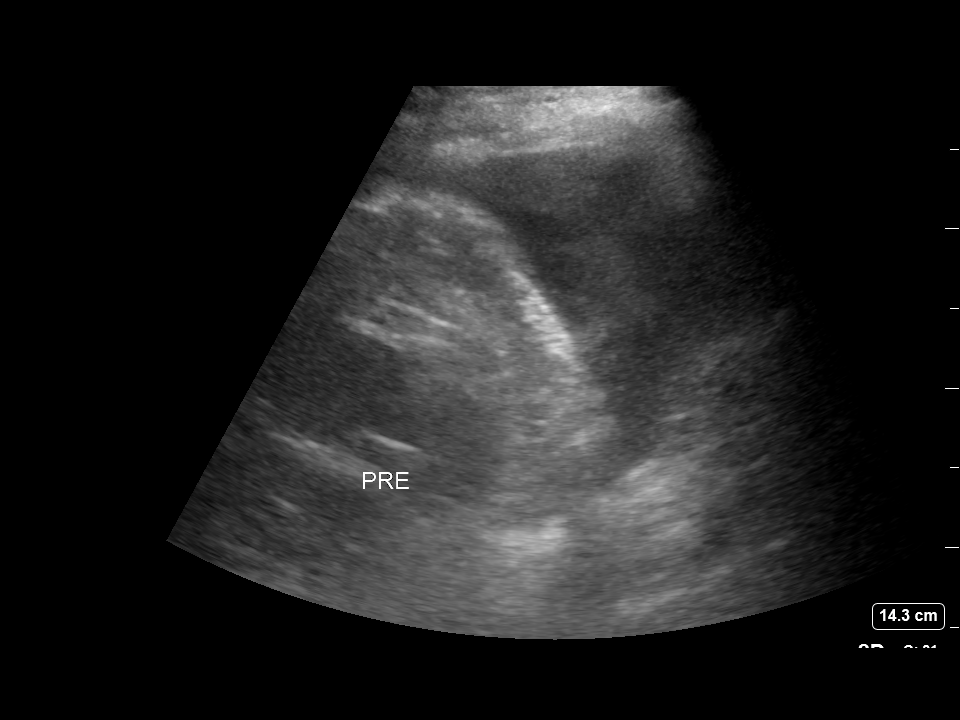
[im 2/8]
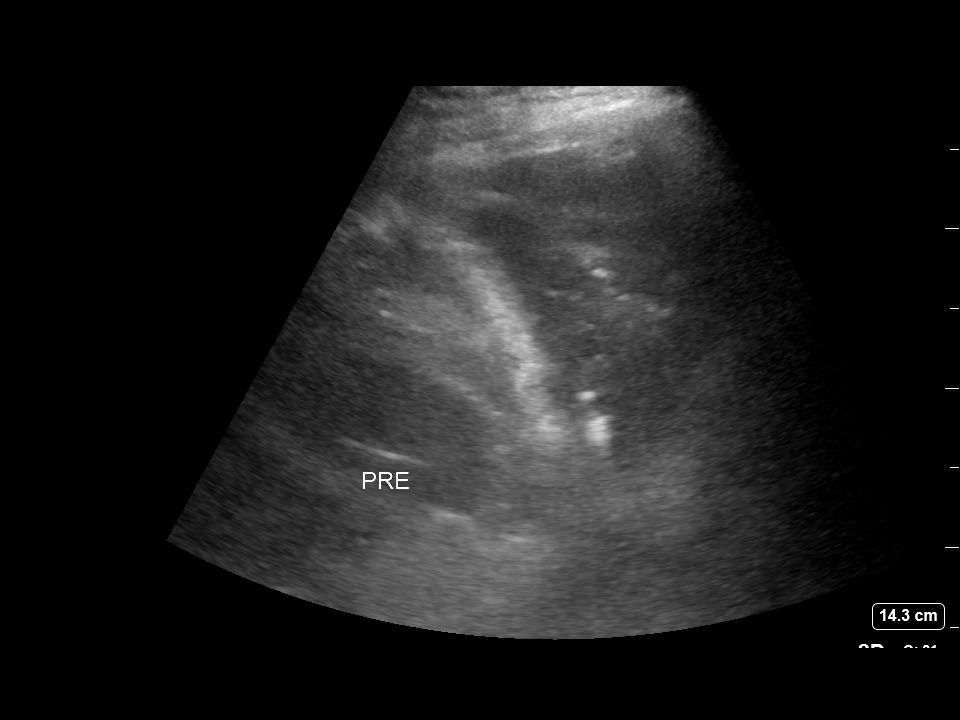
[im 3/8]
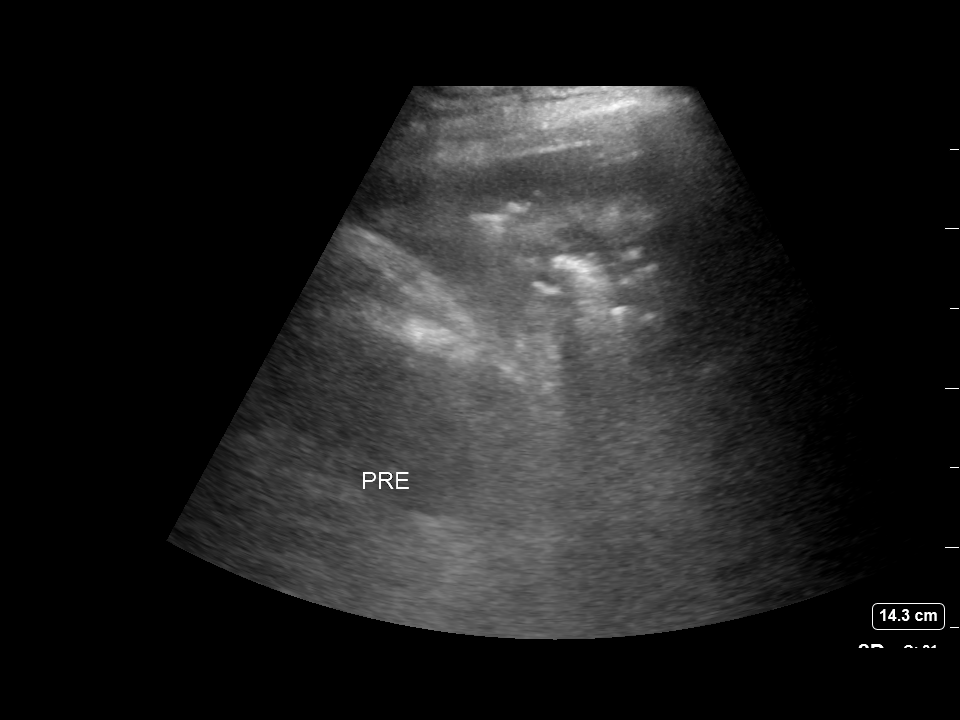
[im 4/8]
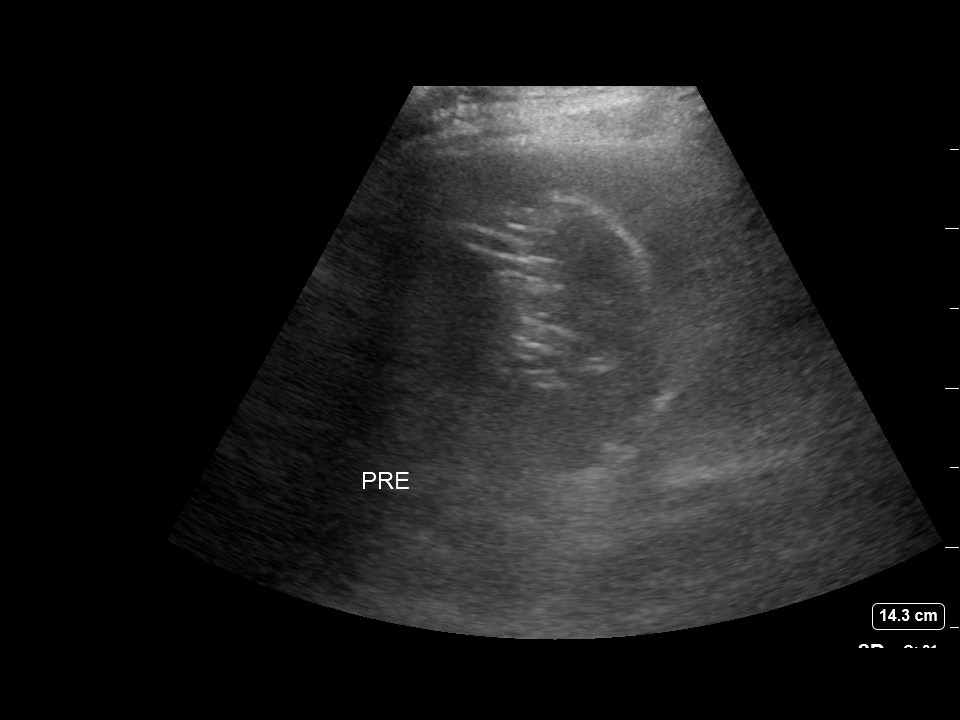
[im 5/8]
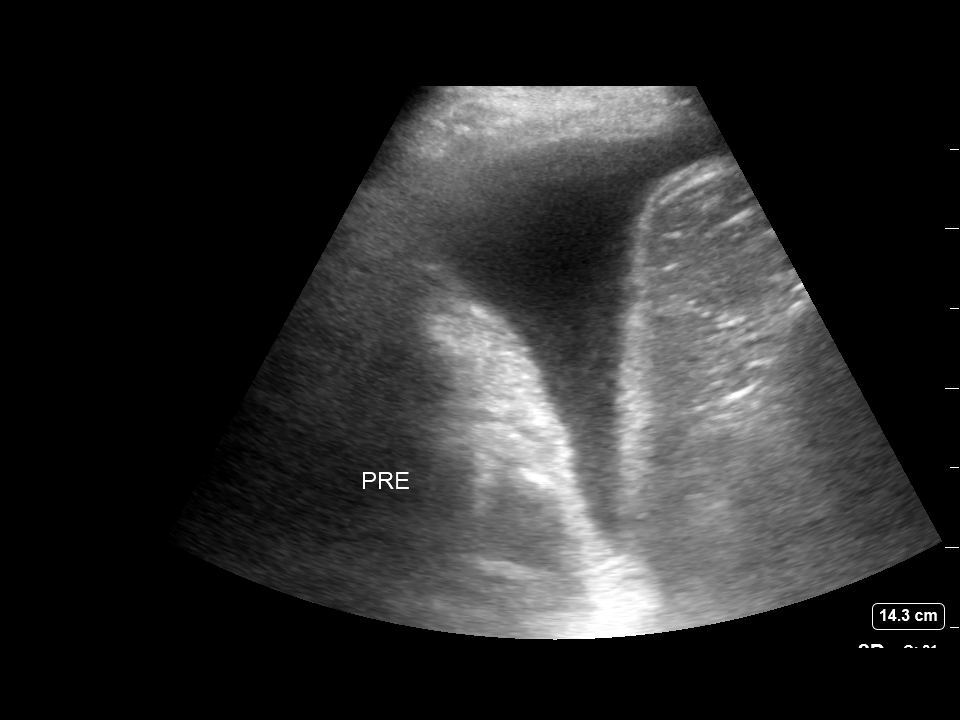
[im 6/8]
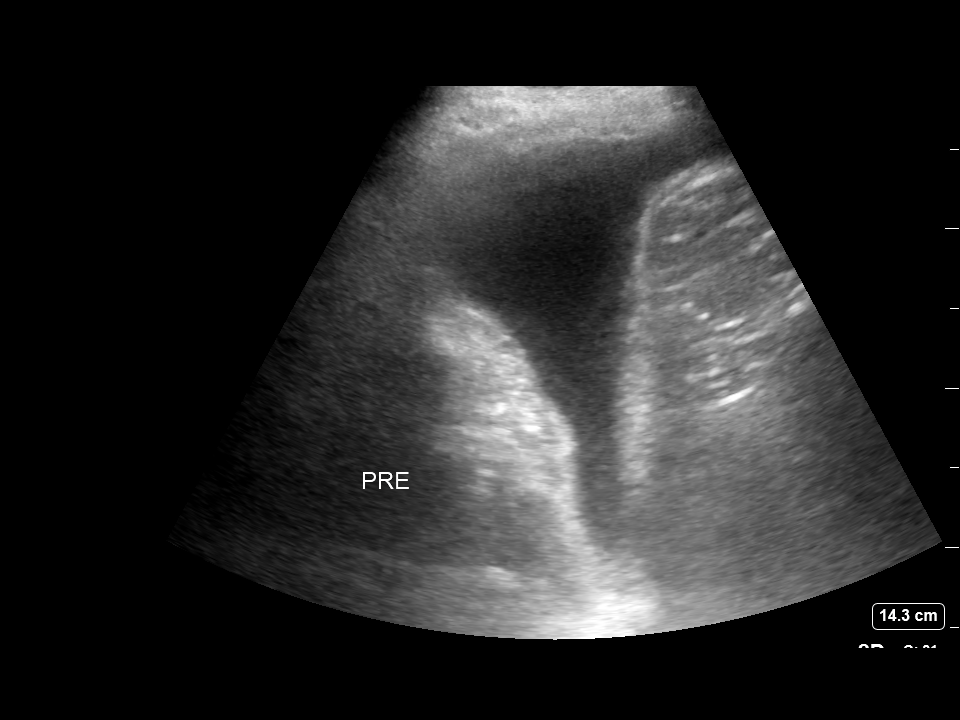
[im 7/8]
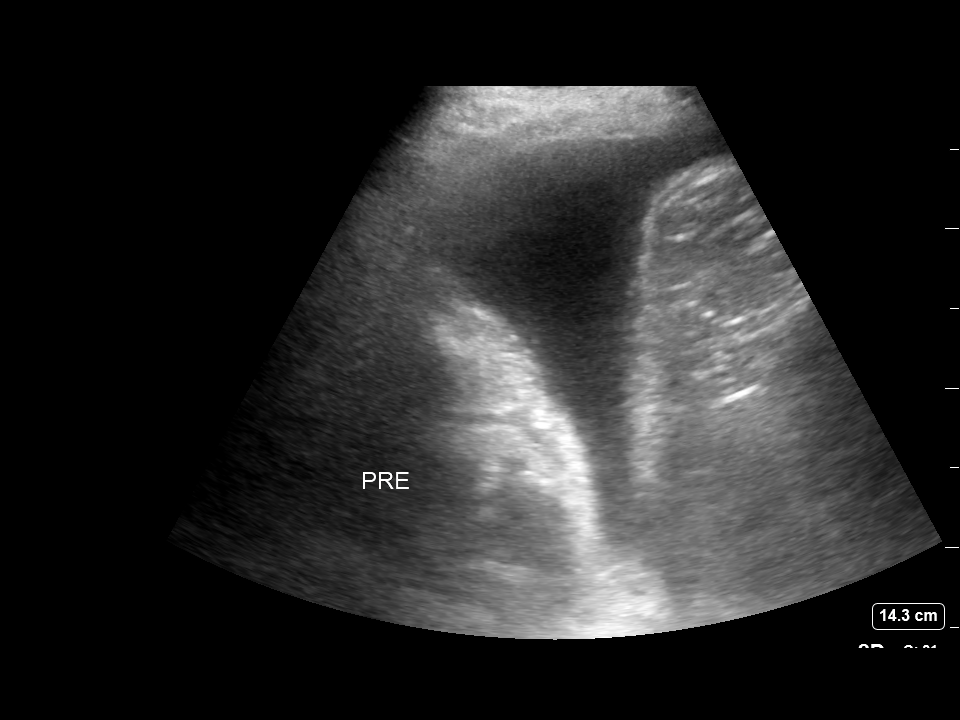
[im 8/8]
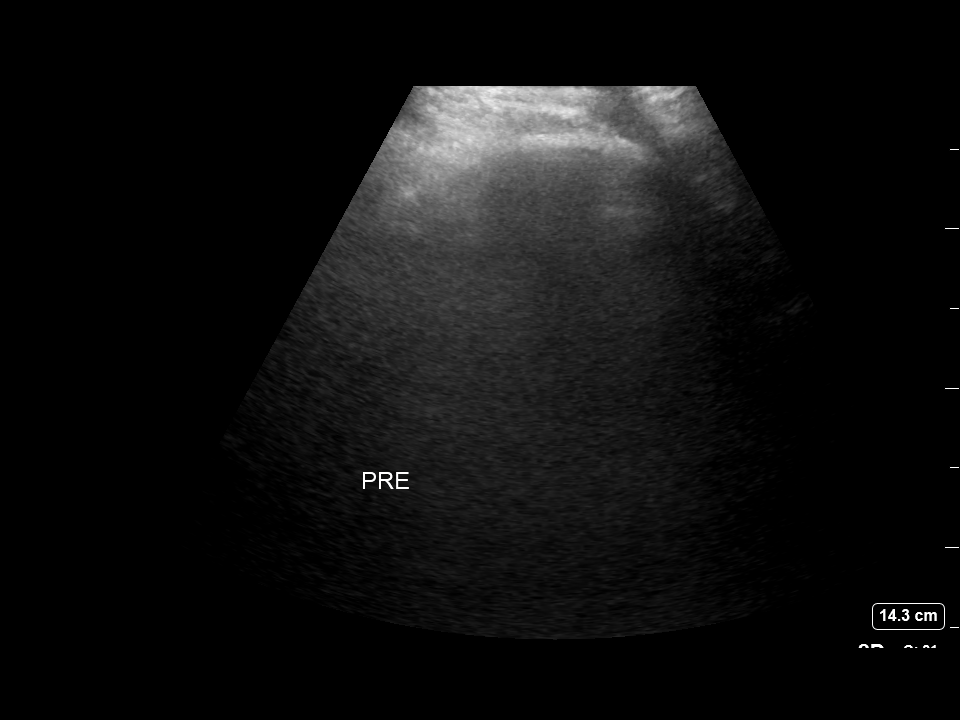

[8 of 8 positions shown; findings below may reference images not displayed]

EXAM:
ULTRASOUND GUIDED DIAGNOSTIC AND THERAPEUTIC RIGHT THORACENTESIS

MEDICATIONS:
10 mL 1% lidocaine

COMPLICATIONS:
None immediate.

PROCEDURE:
An ultrasound guided thoracentesis was thoroughly discussed with the
patient and questions answered. The benefits, risks, alternatives
and complications were also discussed. The patient understands and
wishes to proceed with the procedure. Written consent was obtained.

Ultrasound was performed to localize and mark an adequate pocket of
fluid in the right chest. The area was then prepped and draped in
the normal sterile fashion. 1% Lidocaine was used for local
anesthesia. Under ultrasound guidance a 6 Fr Safe-T-Centesis
catheter was introduced. Thoracentesis was performed. The catheter
was removed and a dressing applied.
FINDINGS: A total of approximately 800 mL of hazy amber fluid was removed.
Samples were sent to the laboratory as requested by the clinical
team.
IMPRESSION: Successful ultrasound guided right thoracentesis yielding 800 mL of
pleural fluid.
